# Patient Record
Sex: Female | Born: 1935
Health system: Southern US, Community
[De-identification: ages and names within clinical notes are randomized; demographics above are authoritative.]

## PROBLEM LIST (undated history)

## (undated) DIAGNOSIS — Z87440 Personal history of urinary (tract) infections: Secondary | ICD-10-CM

## (undated) DIAGNOSIS — K219 Gastro-esophageal reflux disease without esophagitis: Secondary | ICD-10-CM

## (undated) DIAGNOSIS — Z5320 Procedure and treatment not carried out because of patient's decision for unspecified reasons: Secondary | ICD-10-CM

## (undated) DIAGNOSIS — I872 Venous insufficiency (chronic) (peripheral): Secondary | ICD-10-CM

## (undated) DIAGNOSIS — E785 Hyperlipidemia, unspecified: Secondary | ICD-10-CM

## (undated) DIAGNOSIS — D649 Anemia, unspecified: Secondary | ICD-10-CM

## (undated) DIAGNOSIS — I4891 Unspecified atrial fibrillation: Secondary | ICD-10-CM

## (undated) DIAGNOSIS — M419 Scoliosis, unspecified: Secondary | ICD-10-CM

## (undated) DIAGNOSIS — F419 Anxiety disorder, unspecified: Secondary | ICD-10-CM

## (undated) DIAGNOSIS — R413 Other amnesia: Secondary | ICD-10-CM

## (undated) DIAGNOSIS — M1990 Unspecified osteoarthritis, unspecified site: Secondary | ICD-10-CM

## (undated) DIAGNOSIS — M797 Fibromyalgia: Secondary | ICD-10-CM

## (undated) DIAGNOSIS — C189 Malignant neoplasm of colon, unspecified: Secondary | ICD-10-CM

## (undated) DIAGNOSIS — Z8612 Personal history of poliomyelitis: Secondary | ICD-10-CM

## (undated) DIAGNOSIS — Z888 Allergy status to other drugs, medicaments and biological substances status: Secondary | ICD-10-CM

## (undated) HISTORY — DX: Venous insufficiency (chronic) (peripheral): I87.2

## (undated) HISTORY — DX: Allergy status to other drugs, medicaments and biological substances: Z88.8

## (undated) HISTORY — DX: Hyperlipidemia, unspecified: E78.5

## (undated) HISTORY — DX: Fibromyalgia: M79.7

## (undated) HISTORY — DX: Unspecified osteoarthritis, unspecified site: M19.90

## (undated) HISTORY — PX: OTHER SURGICAL HISTORY: SHX169

## (undated) HISTORY — PX: TOTAL ABDOMINAL HYSTERECTOMY W/ BILATERAL SALPINGOOPHORECTOMY: SHX83

## (undated) HISTORY — DX: Anxiety disorder, unspecified: F41.9

## (undated) HISTORY — PX: APPENDECTOMY: SHX54

## (undated) HISTORY — DX: Malignant neoplasm of colon, unspecified: C18.9

## (undated) HISTORY — DX: Procedure and treatment not carried out because of patient's decision for unspecified reasons: Z53.20

## (undated) HISTORY — DX: Anemia, unspecified: D64.9

## (undated) HISTORY — DX: Scoliosis, unspecified: M41.9

## (undated) HISTORY — DX: Unspecified atrial fibrillation: I48.91

## (undated) HISTORY — DX: Gastro-esophageal reflux disease without esophagitis: K21.9

## (undated) HISTORY — DX: Personal history of poliomyelitis: Z86.12

## (undated) HISTORY — DX: Personal history of urinary (tract) infections: Z87.440

---
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
## 1999-02-04 ENCOUNTER — Ambulatory Visit (HOSPITAL_COMMUNITY): Admission: RE | Admit: 1999-02-04 | Discharge: 1999-02-04 | Payer: Self-pay | Admitting: Obstetrics and Gynecology

## 1999-02-04 ENCOUNTER — Encounter: Payer: Self-pay | Admitting: Obstetrics and Gynecology

## 1999-03-18 ENCOUNTER — Other Ambulatory Visit: Admission: RE | Admit: 1999-03-18 | Discharge: 1999-03-18 | Payer: Self-pay | Admitting: Obstetrics and Gynecology

## 2000-07-27 ENCOUNTER — Other Ambulatory Visit: Admission: RE | Admit: 2000-07-27 | Discharge: 2000-07-27 | Payer: Self-pay | Admitting: Obstetrics and Gynecology

## 2000-07-27 ENCOUNTER — Encounter: Payer: Self-pay | Admitting: Obstetrics and Gynecology

## 2000-07-27 ENCOUNTER — Ambulatory Visit (HOSPITAL_COMMUNITY): Admission: RE | Admit: 2000-07-27 | Discharge: 2000-07-27 | Payer: Self-pay | Admitting: Obstetrics and Gynecology

## 2001-08-23 ENCOUNTER — Ambulatory Visit (HOSPITAL_COMMUNITY): Admission: RE | Admit: 2001-08-23 | Discharge: 2001-08-23 | Payer: Self-pay | Admitting: Obstetrics and Gynecology

## 2001-08-23 ENCOUNTER — Encounter: Payer: Self-pay | Admitting: Obstetrics and Gynecology

## 2003-09-18 ENCOUNTER — Ambulatory Visit (HOSPITAL_COMMUNITY): Admission: RE | Admit: 2003-09-18 | Discharge: 2003-09-18 | Payer: Self-pay | Admitting: Obstetrics and Gynecology

## 2004-09-30 ENCOUNTER — Ambulatory Visit (HOSPITAL_COMMUNITY): Admission: RE | Admit: 2004-09-30 | Discharge: 2004-09-30 | Payer: Self-pay | Admitting: Obstetrics and Gynecology

## 2004-12-02 ENCOUNTER — Ambulatory Visit: Payer: Self-pay | Admitting: Pulmonary Disease

## 2005-12-15 ENCOUNTER — Ambulatory Visit: Payer: Self-pay | Admitting: Pulmonary Disease

## 2006-01-26 ENCOUNTER — Ambulatory Visit (HOSPITAL_COMMUNITY): Admission: RE | Admit: 2006-01-26 | Discharge: 2006-01-26 | Payer: Self-pay | Admitting: Obstetrics and Gynecology

## 2006-12-29 ENCOUNTER — Ambulatory Visit: Payer: Self-pay | Admitting: Pulmonary Disease

## 2006-12-29 LAB — CONVERTED CEMR LAB
ALT: 16 units/L (ref 0–35)
Alkaline Phosphatase: 98 units/L (ref 39–117)
BUN: 11 mg/dL (ref 6–23)
Bilirubin, Direct: 0.1 mg/dL (ref 0.0–0.3)
Calcium: 9.2 mg/dL (ref 8.4–10.5)
Direct LDL: 154.3 mg/dL
Eosinophils Absolute: 0.3 10*3/uL (ref 0.0–0.6)
GFR calc Af Amer: 106 mL/min
GFR calc non Af Amer: 88 mL/min
HCT: 38.2 % (ref 36.0–46.0)
Hemoglobin: 13 g/dL (ref 12.0–15.0)
MCV: 87.9 fL (ref 78.0–100.0)
Neutro Abs: 3.7 10*3/uL (ref 1.4–7.7)
Platelets: 245 10*3/uL (ref 150–400)
TSH: 1.61 microintl units/mL (ref 0.35–5.50)
Triglycerides: 110 mg/dL (ref 0–149)
WBC: 6 10*3/uL (ref 4.5–10.5)

## 2008-10-30 ENCOUNTER — Ambulatory Visit: Payer: Self-pay | Admitting: Pulmonary Disease

## 2008-10-30 ENCOUNTER — Encounter: Payer: Self-pay | Admitting: Adult Health

## 2008-10-30 DIAGNOSIS — M797 Fibromyalgia: Secondary | ICD-10-CM | POA: Insufficient documentation

## 2008-10-30 DIAGNOSIS — F411 Generalized anxiety disorder: Secondary | ICD-10-CM | POA: Insufficient documentation

## 2008-10-30 DIAGNOSIS — M4120 Other idiopathic scoliosis, site unspecified: Secondary | ICD-10-CM | POA: Insufficient documentation

## 2008-10-30 DIAGNOSIS — Z8612 Personal history of poliomyelitis: Secondary | ICD-10-CM

## 2008-10-30 DIAGNOSIS — E785 Hyperlipidemia, unspecified: Secondary | ICD-10-CM

## 2008-10-30 LAB — CONVERTED CEMR LAB: Vit D, 25-Hydroxy: 34 ng/mL (ref 30–89)

## 2008-11-01 LAB — CONVERTED CEMR LAB
ALT: 16 units/L (ref 0–35)
AST: 27 units/L (ref 0–37)
Albumin: 3.8 g/dL (ref 3.5–5.2)
Basophils Relative: 0.1 % (ref 0.0–3.0)
Bilirubin Urine: NEGATIVE
Bilirubin, Direct: 0.1 mg/dL (ref 0.0–0.3)
CO2: 32 meq/L (ref 19–32)
Calcium: 9.2 mg/dL (ref 8.4–10.5)
Chloride: 110 meq/L (ref 96–112)
Cholesterol: 194 mg/dL (ref 0–200)
Creatinine, Ser: 0.7 mg/dL (ref 0.4–1.2)
Eosinophils Relative: 2.7 % (ref 0.0–5.0)
GFR calc non Af Amer: 87.01 mL/min (ref >60)
HDL: 57.4 mg/dL (ref >39.00)
Ketones, ur: NEGATIVE mg/dL
Leukocytes, UA: NEGATIVE
Lymphocytes Relative: 15.1 % (ref 12.0–46.0)
MCV: 88.9 fL (ref 78.0–100.0)
Monocytes Relative: 7.5 % (ref 3.0–12.0)
Neutrophils Relative %: 74.6 % (ref 43.0–77.0)
RBC: 4.15 M/uL (ref 3.87–5.11)
Sodium: 146 meq/L — ABNORMAL HIGH (ref 135–145)
Specific Gravity, Urine: 1.015 (ref 1.000–1.030)
TSH: 1.46 microintl units/mL (ref 0.35–5.50)
Total CHOL/HDL Ratio: 3
Total Protein, Urine: NEGATIVE mg/dL
Total Protein: 7.1 g/dL (ref 6.0–8.3)
Triglycerides: 93 mg/dL (ref 0.0–149.0)
WBC: 8.4 10*3/uL (ref 4.5–10.5)
pH: 7.5 (ref 5.0–8.0)

## 2009-05-02 ENCOUNTER — Ambulatory Visit (HOSPITAL_COMMUNITY): Admission: RE | Admit: 2009-05-02 | Discharge: 2009-05-02 | Payer: Self-pay | Admitting: Pulmonary Disease

## 2009-06-28 HISTORY — PX: LOW ANTERIOR BOWEL RESECTION: SUR1240

## 2009-07-14 ENCOUNTER — Ambulatory Visit: Payer: Self-pay | Admitting: Internal Medicine

## 2009-07-14 ENCOUNTER — Ambulatory Visit: Payer: Self-pay | Admitting: Gastroenterology

## 2009-07-14 ENCOUNTER — Inpatient Hospital Stay (HOSPITAL_COMMUNITY): Admission: EM | Admit: 2009-07-14 | Discharge: 2009-07-24 | Payer: Self-pay | Admitting: Emergency Medicine

## 2009-07-14 ENCOUNTER — Telehealth: Payer: Self-pay | Admitting: Pulmonary Disease

## 2009-07-15 ENCOUNTER — Encounter: Payer: Self-pay | Admitting: Gastroenterology

## 2009-07-15 ENCOUNTER — Encounter (INDEPENDENT_AMBULATORY_CARE_PROVIDER_SITE_OTHER): Payer: Self-pay | Admitting: Internal Medicine

## 2009-07-17 ENCOUNTER — Encounter (INDEPENDENT_AMBULATORY_CARE_PROVIDER_SITE_OTHER): Payer: Self-pay

## 2009-07-19 ENCOUNTER — Encounter: Payer: Self-pay | Admitting: Internal Medicine

## 2009-07-23 ENCOUNTER — Ambulatory Visit: Payer: Self-pay | Admitting: Oncology

## 2009-07-30 ENCOUNTER — Encounter: Payer: Self-pay | Admitting: Pulmonary Disease

## 2009-08-12 ENCOUNTER — Encounter: Payer: Self-pay | Admitting: Gastroenterology

## 2009-08-20 ENCOUNTER — Telehealth: Payer: Self-pay | Admitting: Internal Medicine

## 2009-08-21 DIAGNOSIS — I4891 Unspecified atrial fibrillation: Secondary | ICD-10-CM

## 2009-08-21 DIAGNOSIS — I480 Paroxysmal atrial fibrillation: Secondary | ICD-10-CM | POA: Insufficient documentation

## 2009-08-27 ENCOUNTER — Ambulatory Visit: Payer: Self-pay | Admitting: Internal Medicine

## 2009-09-03 ENCOUNTER — Ambulatory Visit: Payer: Self-pay | Admitting: Pulmonary Disease

## 2009-09-07 LAB — CONVERTED CEMR LAB
Albumin: 3.5 g/dL (ref 3.5–5.2)
BUN: 13 mg/dL (ref 6–23)
Basophils Relative: 0.1 % (ref 0.0–3.0)
CEA: 3.5 ng/mL (ref 0.0–5.0)
Calcium: 9.2 mg/dL (ref 8.4–10.5)
Eosinophils Relative: 3 % (ref 0.0–5.0)
GFR calc non Af Amer: 74.41 mL/min (ref >60)
Glucose, Bld: 91 mg/dL (ref 70–99)
HCT: 36.8 % (ref 36.0–46.0)
Iron: 39 ug/dL — ABNORMAL LOW (ref 42–145)
Lymphs Abs: 0.7 10*3/uL (ref 0.7–4.0)
Monocytes Relative: 5.3 % (ref 3.0–12.0)
Platelets: 264 10*3/uL (ref 150.0–400.0)
RBC: 4.14 M/uL (ref 3.87–5.11)
Total Bilirubin: 0.4 mg/dL (ref 0.3–1.2)
Transferrin: 256.5 mg/dL (ref 212.0–360.0)
WBC: 8.2 10*3/uL (ref 4.5–10.5)

## 2009-09-08 ENCOUNTER — Encounter: Payer: Self-pay | Admitting: Pulmonary Disease

## 2009-09-08 ENCOUNTER — Encounter: Payer: Self-pay | Admitting: Internal Medicine

## 2009-09-08 DIAGNOSIS — I872 Venous insufficiency (chronic) (peripheral): Secondary | ICD-10-CM

## 2009-09-08 DIAGNOSIS — N390 Urinary tract infection, site not specified: Secondary | ICD-10-CM | POA: Insufficient documentation

## 2009-09-08 DIAGNOSIS — D649 Anemia, unspecified: Secondary | ICD-10-CM

## 2009-09-08 DIAGNOSIS — K219 Gastro-esophageal reflux disease without esophagitis: Secondary | ICD-10-CM | POA: Insufficient documentation

## 2009-09-08 DIAGNOSIS — Z888 Allergy status to other drugs, medicaments and biological substances status: Secondary | ICD-10-CM

## 2009-09-08 DIAGNOSIS — M1990 Unspecified osteoarthritis, unspecified site: Secondary | ICD-10-CM | POA: Insufficient documentation

## 2009-09-08 DIAGNOSIS — C189 Malignant neoplasm of colon, unspecified: Secondary | ICD-10-CM

## 2010-03-18 ENCOUNTER — Ambulatory Visit: Payer: Self-pay | Admitting: Pulmonary Disease

## 2010-03-21 LAB — CONVERTED CEMR LAB
Albumin: 4.2 g/dL (ref 3.5–5.2)
Alkaline Phosphatase: 91 units/L (ref 39–117)
BUN: 13 mg/dL (ref 6–23)
CEA: 1.3 ng/mL (ref 0.0–5.0)
Calcium: 9.4 mg/dL (ref 8.4–10.5)
Eosinophils Absolute: 0.2 10*3/uL (ref 0.0–0.7)
Eosinophils Relative: 3.6 % (ref 0.0–5.0)
GFR calc non Af Amer: 94.42 mL/min (ref >60)
Glucose, Bld: 74 mg/dL (ref 70–99)
HDL: 59.5 mg/dL (ref >39.00)
Lymphocytes Relative: 23.4 % (ref 12.0–46.0)
MCHC: 34 g/dL (ref 30.0–36.0)
Monocytes Relative: 8.4 % (ref 3.0–12.0)
Neutro Abs: 3.8 10*3/uL (ref 1.4–7.7)
Neutrophils Relative %: 64.1 % (ref 43.0–77.0)
RBC: 4.19 M/uL (ref 3.87–5.11)
Sodium: 143 meq/L (ref 135–145)
TSH: 1.17 microintl units/mL (ref 0.35–5.50)
Total Bilirubin: 0.5 mg/dL (ref 0.3–1.2)
Total CHOL/HDL Ratio: 4
Transferrin: 248.5 mg/dL (ref 212.0–360.0)
VLDL: 36.8 mg/dL (ref 0.0–40.0)
WBC: 5.9 10*3/uL (ref 4.5–10.5)

## 2010-03-30 ENCOUNTER — Telehealth (INDEPENDENT_AMBULATORY_CARE_PROVIDER_SITE_OTHER): Payer: Self-pay | Admitting: *Deleted

## 2010-05-25 ENCOUNTER — Encounter (INDEPENDENT_AMBULATORY_CARE_PROVIDER_SITE_OTHER): Payer: Self-pay | Admitting: *Deleted

## 2010-06-26 ENCOUNTER — Encounter (INDEPENDENT_AMBULATORY_CARE_PROVIDER_SITE_OTHER): Payer: Self-pay | Admitting: *Deleted

## 2010-07-01 ENCOUNTER — Ambulatory Visit
Admission: RE | Admit: 2010-07-01 | Discharge: 2010-07-01 | Payer: Self-pay | Source: Home / Self Care | Attending: Gastroenterology | Admitting: Gastroenterology

## 2010-07-16 ENCOUNTER — Ambulatory Visit
Admission: RE | Admit: 2010-07-16 | Discharge: 2010-07-16 | Payer: Self-pay | Source: Home / Self Care | Attending: Gastroenterology | Admitting: Gastroenterology

## 2010-07-16 ENCOUNTER — Encounter: Payer: Self-pay | Admitting: Gastroenterology

## 2010-07-30 NOTE — Miscellaneous (Signed)
Summary: [facility] Previsit/prep  Clinical Lists Changes  Medications: Added new medication of MOVIPREP 100 GM  SOLR (PEG-KCL-NACL-NASULF-NA ASC-C) As per prep instructions. - Signed Rx of MOVIPREP 100 GM  SOLR (PEG-KCL-NACL-NASULF-NA ASC-C) As per prep instructions.;  #1 x 0;  Signed;  Entered by: Wyona Almas RN;  Authorized by: Louis Meckel MD;  Method used: Electronically to [id number] [address], [place], [place]  [address], Ph: [phone], Fax: [phone] Observations: Added new observation of ALLERGY REV: Done (07/01/2010 10:18)    Prescriptions: MOVIPREP 100 GM  SOLR (PEG-KCL-NACL-NASULF-NA ASC-C) As per prep instructions.  #1 x 0   Entered by:   Wyona Almas RN   Authorized by:   Louis Meckel MD   Signed by:   Wyona Almas RN on 07/01/2010   Method used:   Electronically to        [facility] [id number] (retail)       [address] [place], [place]  [address]       Ph: [phone]       Fax: [phone]   RxID:   [id number]

## 2010-07-30 NOTE — Progress Notes (Signed)
Summary: refill meds  Phone Note Refill Request Call back at Home Phone [phone] Message from:  Patient on August 20, 2009 10:58 AM  amiodarone 200 mg [facility] [address]   Method Requested: Fax to Local Pharmacy Initial call taken by: Lorne Skeens,  August 20, 2009 10:59 AM    New/Updated Medications: AMIODARONE HCL 200 MG TABS (AMIODARONE HCL) Take one tablet by mouth twice a day Prescriptions: AMIODARONE HCL 200 MG TABS (AMIODARONE HCL) Take one tablet by mouth twice a day  #60 x 1   Entered by:   Hardin Negus, RMA   Authorized by:   Dolores Patty, MD, [facility]   Signed by:   Hardin Negus, RMA on 08/20/2009   Method used:   Electronically to        [id number]* (retail)       [address] [place], [place]  [address]       Ph: [phone]       Fax: [phone]   RxID:   [id number]

## 2010-07-30 NOTE — Procedures (Addendum)
Summary: Colonoscopy  Patient: [name] Note: All result statuses are Final unless otherwise noted.  Tests: (1) Colonoscopy (COL)   COL Colonoscopy           DONE     [facility]     [address]     [place], [place]  [address]           COLONOSCOPY PROCEDURE REPORT           PATIENT:  Brenda Hammond, Brenda Hammond  MR#:  [id number]     BIRTHDATE:  [birth year], 77 yrs. old  GENDER:  female           ENDOSCOPIST:  Barbette Hair. Arlyce Dice, MD     Referred by:           PROCEDURE DATE:  07/16/2010     PROCEDURE:  Diagnostic Colonoscopy     ASA CLASS:  Class II     INDICATIONS:  1) screening  2) history of colon cancer Resection     2011           MEDICATIONS:   Fentanyl 62.5 mcg IV, Versed 5 mg IV           DESCRIPTION OF PROCEDURE:   After the risks benefits and     alternatives of the procedure were thoroughly explained, informed     consent was obtained.  Digital rectal exam was performed and     revealed no abnormalities.   The LB [id number] endoscope     was introduced through the anus and advanced to the cecum, which     was identified by both the appendix and ileocecal valve, without     limitations.  The quality of the prep was excellent, using     MoviPrep.  The instrument was then slowly withdrawn as the colon     was fully examined.     <<PROCEDUREIMAGES>>           FINDINGS:  Arteriovenous malformations were seen in the in the     cecum (see image2). 4-69mm early AVM  There was a surgical     anastomosis in the sigmoid colon (see image7). Anastamosis at 15cm     This was otherwise a normal examination of the colon (see image1,     image3, image5, image8, and image9).   Retroflexed views in the     rectum revealed no abnormalities.    The time to cecum =  3.25     minutes. The scope was then withdrawn (time =  5.25  min) from the     patient and the procedure completed.           COMPLICATIONS:  None           ENDOSCOPIC IMPRESSION:     1) AVM's in the  cecum     2) Anastomosis in the sigmoid colon     3) Otherwise normal examination     RECOMMENDATIONS:     1) Repeat Colonoscopy in 3 years.           REPEAT EXAM:   3 year(s) Colonoscopy           ______________________________     Barbette Hair. Arlyce Dice, MD           CC: Michele Mcalpine, MD, Rolm Baptise, MD           n.     eSIGNED:   Barbette Hair. Lucianna Ostlund at 07/16/2010 08:58 AM  [place], [place], [phone]  Note: An exclamation mark (!) indicates a result that was not dispersed into the flowsheet. Document Creation Date: 07/16/2010 8:59 AM _______________________________________________________________________  (1) Order result status: Final Collection or observation date-time: 07/16/2010 08:50 Requested date-time:  Receipt date-time:  Reported date-time:  Referring Physician:   Ordering Physician: Melvia Heaps [phone]) Specimen Source:  Source: Launa Grill Order Number: [id number] Lab site:   Appended Document: Colonoscopy    Clinical Lists Changes  Observations: Added new observation of COLONNXTDUE: 06/2013 (07/16/2010 13:41)

## 2010-07-30 NOTE — Letter (Signed)
Summary: [facility]   Imported By: Sherian Rein 09/24/2009 13:36:04  _____________________________________________________________________  External Attachment:    Type:   Image     Comment:   External Document

## 2010-07-30 NOTE — Assessment & Plan Note (Signed)
Summary: f/u 6 months - 1 yr///kp   Primary Care Provider:  scott nadel  CC:  32 mnonth ROV & review of recent medical problems....  History of Present Illness: 77 y/o WF here for a follow up visit... I last saw her 7/08 for an office visit due to rash (she is very allergic to sulfa & was taking Lasix20 for edema)... she refused CPX, routine CXR, EKG, labs; and refused screening colonoscopy, Flu shots, Pneumovax, etc...   Presented to ER 07/13/09 w/ rectal bleeding & colonoscopy by Dorris Singh revealed a sigmoid mass... CT scan was otherw neg (3cm tumor, neg nodes, neg liver) & she had a low anterior resection w/ anastomosis by DrHIngram... path showed adenocarcinoma ?T2-3 N0- neg margins, 11/11 neg nodes, but tumor invasion thru muscularis into pericolonic adipose tissue...  post-op she had AFib & Cards was consulted- she was treated w/ Amio, which has since been stopped in follow up... also had IV infiltate cellulitis- also resolved after Doxy Rx... she had post hosp f/u w/ DrIngram 07/30/09- doing well...  she saw DrSherrill for Oncology 08/12/09- she was given an option for adjuvant chemotherapy vs a research protocol & she chose observation alone...    Current Problems:   ATRIAL FIBRILLATION (ICD-427.31) - this occured post-op colon resection... seen by DrBensimhon for Cards and discharged on Amiodarone 400mg /d... she converted to NSR & the Amio was stopped 3/11... she denies any irregularity/ CP/ palpit/ etc...  ~  Baseline EKG showed NSR, ?LAE, IVCD, borderline tracing...  ~  2DEcho 1/11 showed norm LVF w/ EF= 55-65%, sl incr wall thickness= mild LVH, mild MR, mild LA dil at 29mm...  VENOUS INSUFFICIENCY (ICD-459.81)  HYPERLIPIDEMIA (ICD-272.4) - on diet alone, she has refused statin Rx.  ~  FLP 7/08 showed TChol 220, TG 110, HDL 49, LDL 154  Hx of GERD (ICD-530.81) - ?she reports HH & "ulcer" found on UGI series yrs ago... she knows that she can take PPI (Ompeprazole 20mg ) vs H2Blocker Rx  (Zantac, Pepcid)...  Hx of ADENOCARCINOMA, COLON (ICD-153.9) **see above**  Hx of UTI (ICD-599.0)  DEGENERATIVE JOINT DISEASE (ICD-715.90) FIBROMYALGIA (ICD-729.1) - currently using TYLENOL Prn... symptoms were suggestive of FM & she was referred to Rheum in 1996 [facility])... he felt she had scoliosis + chr LBP w/ poss superimposed FM... he rec Skelaxin & Ambien 5mg  (even 1/2 Skelaxin too sedating, & did try the [place])... he rec counselling & phys therapy... she subseq tried Serzone & improved... she had a second opinion consult from DrTruslow in 1997... rec to try Integrative Therapies...  SCOLIOSIS (ICD-737.30) - she was evaluated by a chiropractor in the 90s w/ DDD... subseq MRI was reported to show no herniations or neuro compromise...  POLIOMYELITIS, HX OF (ICD-V12.02) - she had poliomyelitis in the 1950's... it affected her right leg, and she has a long hx of scoliosis... she saw DrLove in 1996 for vertigo & LBP.  ANXIETY (ICD-300.00) - intol to anxiolytic Rx in the past... she is very religious and "I cope well"...  ANEMIA (ICD-285.9) - Hg was 12.9 on admission 1/11... nadir value in hosp was 9.7.Marland Kitchen.  ~  labs 3/11 showed Hg= 11.9, MCV= 89, Fe= 39 (sat11%), B12= 478... rec> OTC Fe + VitC daily...  Hx of OTHER DRUG ALLERGY (ICD-995.27) - she has mult medication allergies and sensitivities w/ >10 drugs on the list.  ~  intol to HRT after her hyst & BSO...  ~  intol to support hose for her VI, edema.  ~  Tranxene: single dose caused oversedation  ~  Pamelor: caused nightmares  Hx of SURG/OTH PROC NOT CARRIED OUT BECAUSE PTS DECN (ICD-V64.2) - she has repeatedly refused attempts to sched CPX, routine CXR, EKG, lab work, screening colon, f/u GYN checks Hulda Humphrey prev), mammograms, & all vaccinations...   Allergies: 1)  ! Pamelor 2)  ! Pcn 3)  ! Sulfa 4)  ! * Geocillin 5)  ! Ampicillin 6)  ! Macrodantin 7)  ! Flexeril 8)  ! Asa 9)  ! * Skelaxin 10)  ! * Antinflammatories 11)   ! Nsaids  Past History:  Past Medical History:  ATRIAL FIBRILLATION (ICD-427.31) VENOUS INSUFFICIENCY (ICD-459.81) HYPERLIPIDEMIA (ICD-272.4) Hx of GERD (ICD-530.81) Hx of ADENOCARCINOMA, COLON (ICD-153.9) Hx of UTI (ICD-599.0) DEGENERATIVE JOINT DISEASE (ICD-715.90) FIBROMYALGIA (ICD-729.1) SCOLIOSIS (ICD-737.30) POLIOMYELITIS, HX OF (ICD-V12.02) ANXIETY (ICD-300.00) ANEMIA (ICD-285.9) Hx of OTHER DRUG ALLERGY (ICD-995.27) Hx of SURG/OTH PROC NOT CARRIED OUT BECAUSE PTS DECN (ICD-V64.2)  1. Atrial Fib, post-op in setting of colon CA resection 2/10     --echo normal 2. ARTHRITIS -uses as needed  prev ortho eval Dr. Jonathon Resides w/ rotator cuff tendonitis.  3. FIBROMYALGIA  uses as needed tylenol. prev eval w/ Truslow 1997 4. hx of Polio.  5. ANXIETY- no meds.  6. s/p hysterectomy.   7. HYPERLIPIDEMIA   8. UTI  9. SCOLIOSIS   10. POLIOMYELITIS, HX OF  11. HIATAL HERNIA, HX OF    Past Surgical History: S/P T & A S/P appendectomy S/P hysterectomy & oopherectomy in the 1980's S/P low anterior resection w/ anastomosis for colon cancer 1/11 by DrHIngram  Family History: Reviewed history from 10/30/2008 and no changes required. father died at 23 of a stroke mother died at 75 and had a glioblastoma  Social History: Reviewed history from 08/21/2009 and no changes required. originally from [place] and has lived with her husband in [place] since 1968. never smoked no alcohol 1 grown daughter work Systems developer in Brewing technologist  Married   Review of Systems  The patient denies anorexia, fever, weight loss, weight gain, vision loss, decreased hearing, hoarseness, chest pain, syncope, dyspnea on exertion, peripheral edema, prolonged cough, headaches, hemoptysis, abdominal pain, melena, hematochezia, severe indigestion/heartburn, hematuria, incontinence, muscle weakness, suspicious skin lesions, transient blindness, difficulty walking, depression, unusual weight change, abnormal bleeding,  enlarged lymph nodes, and angioedema.    Vital Signs:  Patient profile:   77 year old female Height:      61 inches Weight:      111 pounds BMI:     21.05 O2 Sat:      99 % on Room air Temp:     97.2 degrees F oral Pulse rate:   62 / minute BP sitting:   136 / 72  (left arm) Cuff size:   regular  Vitals Entered By: Randell Loop CMA (September 03, 2009 9:04 AM)  O2 Sat at Rest %:  99 O2 Flow:  Room air CC: 32 mnonth ROV & review of recent medical problems... Is Patient Diabetic? No Pain Assessment Patient in pain? yes      Onset of pain  right knee pain and numbness into her thigh Comments meds updated today   Physical Exam  Additional Exam:  WD, Thin, 77 y/o WF in NAD... GENERAL:  Alert & oriented; pleasant & cooperative. HEENT:  [place]/AT, EOM-wnl, PERRLA, EACs-clear, TMs-wnl, NOSE-clear, THROAT-clear & wnl. NECK:  Supple w/ fairROM; no JVD; normal carotid impulses w/o bruits; no thyromegaly or nodules palpated; no lymphadenopathy. CHEST:  Clear to P & A; without wheezes/ rales/ or rhonchi. HEART:  Regular Rhythm; without murmurs/ rubs/ or gallops. ABDOMEN:  Soft & nontender, s/p surg; normal bowel sounds; no organomegaly or masses detected. EXT: without deformities or arthritic changes; no varicose veins/ venous insuffic/ or edema. NEURO:  CN's intact; motor testing normal; sensory testing normal; gait normal & balance OK. DERM:  No lesions noted; no rash etc...    MISC. Report  Procedure date:  09/03/2009  Findings:      DATA REVIEWED:  ~  Old chart reviewed for EMR entry...  ~  Hosp data from 1/11 including Summaries, Labs, Otis, etc...   Impression & Recommendations:  Problem # 1:  ADENOCARCINOMA, COLON (ICD-153.9) Surg by Lurline Hare,  Oncology from DrSherrill,  GI from DrKaplan Orders: TLB-BMP (Basic Metabolic Panel-BMET) (80048-METABOL) TLB-Hepatic/Liver Function Pnl (80076-HEPATIC) TLB-CBC Platelet - w/Differential (85025-CBCD) TLB-IBC Pnl  (Iron/FE;Transferrin) (83550-IBC) TLB-B12 + Folate Pnl (01027_25366-Y40/HKV) TLB-CEA (Carcinoembryonic Antigen) (82378-CEA)  Problem # 2:  ATRIAL FIBRILLATION (ICD-427.31) Holding NSR off Amio now per DrBensimhon... The following medications were removed from the medication list:    Amiodarone Hcl 200 Mg Tabs (Amiodarone hcl) .Marland Kitchen... Take one tablet by mouth twice a day  Problem # 3:  HYPERLIPIDEMIA (ICD-272.4) Shje continues to refuse Statin Rx...  Problem # 4:  ANEMIA (ICD-285.9) Hg improved to 11.9 ...  Problem # 5:  OTHER MEDICAL PROBLEMS AS NOTED>>>  Complete Medication List: 1)  Tylenol 325 Mg Tabs (Acetaminophen) .... Take 1-2 tabs by mouth every 6-8 h as needed for pain.Marland KitchenMarland Kitchen 2)  Calcium Carbonate-vitamin D 600-400 Mg-unit Tabs (Calcium carbonate-vitamin d) .... Take 1 tablet by mouth two times a day 3)  One-a-day Womens Tabs (Multiple vitamins-calcium) .... Take 1 tablet by mouth once a day 4)  Vitamin D 400 Unit Tabs (Cholecalciferol) .... Once daily  Patient Instructions: 1)  Today we updated your med list- see below.... 2)  Keep your follow up appt w/ DrIngram, and be sure to inquire when you will need a follow up colonoscopy... 3)  Today we did your follow up blood work... please call the "phone tree" in a few days for your lab results.Marland KitchenMarland Kitchen 4)  We will send a copy of your labs to DrIngram & to DrSherrill... 5)  Call for any problems.Marland KitchenMarland Kitchen

## 2010-07-30 NOTE — Assessment & Plan Note (Signed)
Summary: 6 months/apc   Primary Care Deidre Carino:  scott nadel  CC:  6 month ROV & review of mult medical problems....  History of Present Illness: 77 y/o WF here for a follow up visit... I last saw her 7/08 for an office visit due to rash (she is very allergic to sulfa & was taking Lasix20 for edema)... she refused CPX, routine CXR, EKG, labs; and refused screening colonoscopy, Flu shots, Pneumovax, etc...   Presented to ER 07/13/09 w/ rectal bleeding & colonoscopy by Dorris Singh revealed a sigmoid mass... CT scan was otherw neg (3cm tumor, neg nodes, neg liver) & she had a low anterior resection w/ anastomosis by DrHIngram... path showed adenocarcinoma ?T2-3 N0- neg margins, 11/11 neg nodes, but tumor invasion thru muscularis into pericolonic adipose tissue...  post-op she had AFib & Cards was consulted- she was treated w/ Amio, which has since been stopped in follow up... also had IV infiltate cellulitis- also resolved after Doxy Rx... she had post hosp f/u w/ DrIngram 07/30/09- doing well...  she saw DrSherrill for Oncology 08/12/09- she was given an option for adjuvant chemotherapy vs a research protocol & she chose observation alone...    ~  March 18, 2010:  58mo ROV & due for labs w/ CEA level she wants sent to [facility] & DrSherrill... states she is feeling well- walking daily, good appetite, eating well/ no problems... denies Abd pain, nausea, vomiting, diarrhea (x w/ chinese food), constip, blood, etc... she is not on any perscription meds just taking Calcium, MVI, Vit D, SlowFe...   Current Problems:   ATRIAL FIBRILLATION (ICD-427.31) - this occured post-op colon resection... seen by DrBensimhon for Cards and discharged on Amiodarone 400mg /d... she converted to NSR & the Amio was stopped 3/11... she denies any irregularity/ CP/ palpit/ etc...  ~  Baseline EKG showed NSR, ?LAE, IVCD, borderline tracing...  ~  2DEcho 1/11 showed norm LVF w/ EF= 55-65%, sl incr wall thickness= mild LVH, mild MR,  mild LA dil at 29mm...  VENOUS INSUFFICIENCY (ICD-459.81) - she knows to restrict sodium, elevate legs, wear support hose, etc...  HYPERLIPIDEMIA (ICD-272.4) - on diet alone, she has refused statin Rx, and doesn't want f/u FLP.  ~  FLP 7/08 showed TChol 220, TG 110, HDL 49, LDL 154  ~  FLP 9/11 showed TChol 228, TG 184, HDL 60, LDL 135... she desires to continue diet alone.  Hx of GERD (ICD-530.81) - ?she reports HH & "ulcer" found on UGI series yrs ago... she knows that she can take PPI (Ompeprazole 20mg ) vs H2Blocker Rx (Zantac, Pepcid)...  Hx of ADENOCARCINOMA, COLON (ICD-153.9) **see above** she is due for f/u colonoscopy 1/12 by DrKaplan...  ~  NOTE:  pre op CEA level 1/11 = 5.2  ~  f/u CEA level 3/11 = 3.5  ~  labs 9/11 showed CEA level = 1.3  Hx of UTI (ICD-599.0)  DEGENERATIVE JOINT DISEASE (ICD-715.90) FIBROMYALGIA (ICD-729.1) - currently using TYLENOL Prn... symptoms were suggestive of FM & she was referred to Rheum in 1996 [facility])... he felt she had scoliosis + chr LBP w/ poss superimposed FM... he rec Skelaxin & Ambien 5mg  (even 1/2 Skelaxin too sedating, & did try the [place])... he rec counselling & phys therapy... she subseq tried Serzone & improved... she had a second opinion consult from DrTruslow in 1997... rec to try Integrative Therapies...  SCOLIOSIS (ICD-737.30) - she was evaluated by a chiropractor in the 90s w/ DDD... subseq MRI was reported to show no herniations or neuro  compromise...  POLIOMYELITIS, HX OF (ICD-V12.02) - she had poliomyelitis in the 1950's... it affected her right leg, and she has a long hx of scoliosis... she saw DrLove in 1996 for vertigo & LBP.  ANXIETY (ICD-300.00) - intol to anxiolytic Rx in the past... she is very religious and "I cope well"...  ANEMIA (ICD-285.9) - Hg was 12.9 on admission 1/11... nadir value in hosp was 9.7.Marland Kitchen.  ~  labs 3/11 showed Hg= 11.9, MCV= 89, Fe= 39 (sat11%), B12= 478... rec> OTC Fe + VitC daily...  ~  labs 9/11  showed Hg=   Hx of OTHER DRUG ALLERGY (ICD-995.27) - she has mult medication allergies and sensitivities w/ >10 drugs on the list.  ~  intol to HRT after her hyst & BSO...  ~  intol to support hose for her VI, edema.  ~  Tranxene: single dose caused oversedation.  ~  Pamelor: caused nightmares.  Hx of SURG/OTH PROC NOT CARRIED OUT BECAUSE PTS DECN (ICD-V64.2) - she had repeatedly refused attempts to sched CPX, routine CXR, EKG, lab work, screening colon, f/u GYN checks Hulda Humphrey prev), mammograms, & all vaccinations...   Preventive Screening-Counseling & Management  Alcohol-Tobacco     Smoking Status: never  Allergies: 1)  ! Pamelor 2)  ! Pcn 3)  ! Sulfa 4)  ! * Geocillin 5)  ! Ampicillin 6)  ! Macrodantin 7)  ! Flexeril 8)  ! Asa 9)  ! * Skelaxin 10)  ! * Antinflammatories 11)  ! Nsaids  Comments:  Nurse/Medical Assistant: The patient's medications and allergies were reviewed with the patient and were updated in the Medication and Allergy Lists.  Past History:  Past Medical History: ATRIAL FIBRILLATION (ICD-427.31) VENOUS INSUFFICIENCY (ICD-459.81) HYPERLIPIDEMIA (ICD-272.4) Hx of GERD (ICD-530.81) Hx of ADENOCARCINOMA, COLON (ICD-153.9) Hx of UTI (ICD-599.0) DEGENERATIVE JOINT DISEASE (ICD-715.90) FIBROMYALGIA (ICD-729.1) SCOLIOSIS (ICD-737.30) POLIOMYELITIS, HX OF (ICD-V12.02) ANXIETY (ICD-300.00) ANEMIA (ICD-285.9) Hx of OTHER DRUG ALLERGY (ICD-995.27) Hx of SURG/OTH PROC NOT CARRIED OUT BECAUSE PTS DECN (ICD-V64.2)  Past Surgical History: S/P T & A S/P appendectomy S/P hysterectomy & oopherectomy in the 1980's S/P low anterior resection w/ anastomosis for colon cancer 1/11 by DrHIngram  Family History: Reviewed history from 10/30/2008 and no changes required. father died at 52 of a stroke mother died at 75 and had a glioblastoma  Social History: Reviewed history from 08/21/2009 and no changes required. originally from [place] and has lived with her  husband in [place] since 1968. never smoked no alcohol 1 grown daughter work Systems developer in Brewing technologist  Married   Review of Systems      See HPI  The patient denies anorexia, fever, weight loss, weight gain, vision loss, decreased hearing, hoarseness, chest pain, syncope, dyspnea on exertion, peripheral edema, prolonged cough, headaches, hemoptysis, abdominal pain, melena, hematochezia, severe indigestion/heartburn, hematuria, incontinence, muscle weakness, suspicious skin lesions, transient blindness, difficulty walking, depression, unusual weight change, abnormal bleeding, enlarged lymph nodes, and angioedema.    Vital Signs:  Patient profile:   77 year old female Height:      61 inches Weight:      113 pounds BMI:     21.43 O2 Sat:      99 % on Room air Temp:     97.2 degrees F oral Pulse rate:   69 / minute BP sitting:   128 / 80  (left arm) Cuff size:   regular  Vitals Entered By: Randell Loop CMA (March 18, 2010 10:36 AM)  O2 Sat at Rest %:  99 O2 Flow:  Room air CC: 6 month ROV & review of mult medical problems... Is Patient Diabetic? No Pain Assessment Patient in pain? no      Comments meds updated today with pt   Physical Exam  Additional Exam:  WD, Thin, 77 y/o WF in NAD... GENERAL:  Alert & oriented; pleasant & cooperative. HEENT:  [place]/AT, EOM-wnl, PERRLA, EACs-clear, TMs-wnl, NOSE-clear, THROAT-clear & wnl. NECK:  Supple w/ fairROM; no JVD; normal carotid impulses w/o bruits; no thyromegaly or nodules palpated; no lymphadenopathy. CHEST:  Clear to P & A; without wheezes/ rales/ or rhonchi. HEART:  Regular Rhythm; without murmurs/ rubs/ or gallops. ABDOMEN:  Soft & nontender, s/p surg; normal bowel sounds; no organomegaly or masses detected. EXT: without deformities or arthritic changes; no varicose veins/ venous insuffic/ or edema. NEURO:  CN's intact; motor testing normal; sensory testing normal; gait normal & balance OK. DERM:  No lesions noted; no  rash etc...    MISC. Report  Procedure date:  03/18/2010  Findings:      BMP (METABOL)   Sodium                    143 mEq/L                   135-145   Potassium                 4.4 mEq/L                   3.5-5.1   Chloride                  104 mEq/L                   96-112   Carbon Dioxide            29 mEq/L                    19-32   Glucose                   74 mg/dL                    16-10   BUN                       13 mg/dL                    9-60   Creatinine                0.7 mg/dL                   4.5-4.0   Calcium                   9.4 mg/dL                   9.8-11.9   GFR                       94.42 mL/min                >60   Hepatic/Liver Function Panel (HEPATIC)   Total Bilirubin           0.5 mg/dL                   1.4-7.8  Direct Bilirubin          0.1 mg/dL                   1.9-1.4   Alkaline Phosphatase      91 U/L                      39-117   AST                       28 U/L                      0-37   ALT                       18 U/L                      0-35   Total Protein             7.0 g/dL                    7.8-2.9   Albumin                   4.2 g/dL                    5.6-2.1  CBC Platelet w/Diff (CBCD)   White Cell Count          5.9 K/uL                    4.5-10.5   Red Cell Count            4.19 Mil/uL                 3.87-5.11   Hemoglobin                12.9 g/dL                   30.8-65.7   Hematocrit                38.0 %                      36.0-46.0   MCV                       90.8 fl                     78.0-100.0   Platelet Count            179.0 K/uL                  150.0-400.0   Neutrophil %              64.1 %                      43.0-77.0   Lymphocyte %              23.4 %                      12.0-46.0   Monocyte %                8.4 %  3.0-12.0   Eosinophils%              3.6 %                       0.0-5.0   Basophils %               0.5 %                       0.0-3.0  Comments:      IBC  Panel (IBC)   Iron                 [H]  147 ug/dL                   78-295   Transferrin               248.5 mg/dL                 621.3-086.5   Iron Saturation           42.3 %                      20.0-50.0  Lipid Panel (LIPID)   Cholesterol          [H]  228 mg/dL                   7-846   Triglycerides        [H]  184.0 mg/dL                 9.6-295.2   HDL                       84.13 mg/dL                 >24.40 Cholesterol LDL - Direct                             134.8 mg/dL  TSH (TSH)   FastTSH                   1.17 uIU/mL                 0.35-5.50  CEA (CEA)   CEA                       1.3 ng/mL                   0.0-5.0  Vitamin D (25-Hydroxy) (10272)  Vitamin D (25-Hydroxy)                             55 ng/mL                    30-89   Impression & Recommendations:  Problem # 1:  Hx of ADENOCARCINOMA, COLON (ICD-153.9) F/u labs look good and she is asymptomatic... copies too DrIngram & Sherrill per request... Orders: TLB-BMP (Basic Metabolic Panel-BMET) (80048-METABOL) TLB-Hepatic/Liver Function Pnl (80076-HEPATIC) TLB-CBC Platelet - w/Differential (85025-CBCD) TLB-IBC Pnl (Iron/FE;Transferrin) (83550-IBC) TLB-Lipid Panel (80061-LIPID) TLB-TSH (Thyroid Stimulating Hormone) (84443-TSH) TLB-CEA (Carcinoembryonic Antigen) (82378-CEA) >> 1.3 down from 5.2 & 3.5 T-Vitamin D (25-Hydroxy) (53664-40347)  Problem # 2:  ATRIAL FIBRILLATION (ICD-427.31) She is holding NSR... she knows to avoid caffeine,  pseudophed, etc...  Problem # 3:  VENOUS INSUFFICIENCY (ICD-459.81) She nows to avoid sodium/ salt, elevate legs, etc... states she is intol to support hose.  Problem # 4:  HYPERLIPIDEMIA (ICD-272.4) She follows a low chol/ low fat diet & refuses statin Rx... not interested in FLP f/u etc...  Problem # 5:  Hx of GERD (ICD-530.81) She denies GERD, abd pain, etc...  Problem # 6:  DEGENERATIVE JOINT DISEASE (ICD-715.90) She has hx of DJD, FM, Scoliosis, & LBP... uses  Tylenol Prn & states she is doing well- no other complaints. Her updated medication list for this problem includes:    Tylenol 325 Mg Tabs (Acetaminophen) .Marland Kitchen... Take 1-2 tabs by mouth every 6-8 h as needed for pain...  Problem # 7:  ANEMIA (ICD-285.9) Blood count & iron level improved... she is wanting to stop the Fe supplement... Her updated medication list for this problem includes:    Slow Fe 160 (50 Fe) Mg Cr-tabs (Ferrous sulfate dried) .Marland Kitchen... Take 1 tablet by mouth once a day  Problem # 8:  OTHER MEDICAL PROBLEMS AS NOTED>>>   Complete Medication List: 1)  Tylenol 325 Mg Tabs (Acetaminophen) .... Take 1-2 tabs by mouth every 6-8 h as needed for pain.Marland KitchenMarland Kitchen 2)  Calcium Carbonate-vitamin D 600-400 Mg-unit Tabs (Calcium carbonate-vitamin d) .... Take 1 tablet by mouth two times a day 3)  One-a-day Womens Tabs (Multiple vitamins-calcium) .... Take 1 tablet by mouth once a day 4)  Vitamin D 400 Unit Tabs (Cholecalciferol) .... Once daily 5)  Slow Fe 160 (50 Fe) Mg Cr-tabs (Ferrous sulfate dried) .... Take 1 tablet by mouth once a day  Patient Instructions: 1)  Today we updated your med list- see below.... 2)  Today we did your FASTING blood work & included a CEA level... please call the "phone tree" in a few days for your lab results.Marland KitchenMarland Kitchen 3)  We will send copies to [facility] & DrSherrill... 4)  Call for any problems.Marland KitchenMarland Kitchen 5)  Please schedule a follow-up appointment in 6 months.

## 2010-07-30 NOTE — Progress Notes (Signed)
Summary: Supporting lab dx codes  ---- Converted from flag ---- ---- 03/27/2010 5:44 PM, Michele Mcalpine MD wrote: use 733.90 and 268.9.Marland KitchenMarland Kitchen SN  ---- 03/27/2010 2:44 PM, Leodis Liverpool wrote: Please send supporting dx code for Vit D on 03/18/10.  [facility] denied payment by insurance with 780.2 and 780.4. Thanks Darl Pikes ------------------------------

## 2010-07-30 NOTE — Letter (Signed)
[facility]   Imported By: Sherian Rein 08/29/2009 11:29:22  _____________________________________________________________________  External Attachment:    Type:   Image     Comment:   External Document

## 2010-07-30 NOTE — Assessment & Plan Note (Signed)
Summary: eph/per d/c summary/jml   Visit Type:  Initial Consult Primary Provider:  scott nadel  CC:  post hospital.  History of Present Illness: Brenda Hammond is a very pleasant 77 y/o woman who I saw recently in the hospital for post-op atrial fib with ventricular rates over 200 bpm in the setting of colon CA resection. She converted with amiodarone and is here for f/u. Echo showed normal LV function.  She returns today for post-hospital f/u. Doing well. back to work 2 hours per day. No palpitations or other cardiac symptoms. Bowel probelms straightened out. Will not need chemo.  Current Medications (verified): 1)  Furosemide 20 Mg Tabs (Furosemide) .... 1/2-1 Whole Tab By Mouth Once Daily As Needed Swelling 2)  One-A-Day Womens  Tabs (Multiple Vitamins-Calcium) .... Take 1 Tablet By Mouth Once A Day 3)  Calcium Carbonate-Vitamin D 600-400 Mg-Unit  Tabs (Calcium Carbonate-Vitamin D) .... Take 1 Tablet By Mouth Two Times A Day 4)  Amiodarone Hcl 200 Mg Tabs (Amiodarone Hcl) .... Take One Tablet By Mouth Twice A Day 5)  Vitamin D 400 Unit  Tabs (Cholecalciferol) .... Once Daily  Allergies (verified): 1)  ! Pamelor 2)  ! Pcn 3)  ! Sulfa 4)  ! * Geocillin 5)  ! Ampicillin 6)  ! Macrodantin 7)  ! Flexeril 8)  ! Asa 9)  ! * Skelaxin 10)  ! * Antinflammatories 11)  ! Nsaids  Past History:  Past Medical History: 1. Atrial Fib, post-op in setting of colon CA resection 2/10     --echo normal 2. ARTHRITIS -uses as needed  prev ortho eval Dr. Jonathon Resides w/ rotator cuff tendonitis.  3. FIBROMYALGIA  uses as needed tylenol. prev eval w/ Truslow 1997 4. hx of Polio.  5. ANXIETY- no meds.  6. s/p hysterectomy.   7. HYPERLIPIDEMIA   8. UTI  9. SCOLIOSIS   10. POLIOMYELITIS, HX OF  11. HIATAL HERNIA, HX OF   12. refuses colonoscoy referral (Oct 30, 2008), refuses Td / Pnuemovax (Oct 30, 2008 )  Past Surgical History: colon resection Abdominal Hysterectomy-Total  Review of Systems   As per HPI and past medical history; otherwise all systems negative.   Vital Signs:  Patient profile:   77 year old female Height:      61 inches Weight:      107 pounds BMI:     20.29 Pulse rate:   60 / minute BP sitting:   142 / 68  (left arm) Cuff size:   regular  Vitals Entered By: Hardin Negus, RMA (August 27, 2009 10:49 AM)  Physical Exam  General:  Gen: well appearing. no resp difficulty HEENT: normal Neck: supple. no JVD. Carotids 2+ bilat; no bruits. No lymphadenopathy or thryomegaly appreciated. Cor: PMI nondisplaced. Regular rate & rhythm. No rubs, gallops, murmur. Lungs: clear Abdomen: soft, nontender, nondistended. No hepatosplenomegaly. No bruits or masses. Good bowel sounds. Extremities: no cyanosis, clubbing, rash, edema Neuro: alert & orientedx3, cranial nerves grossly intact. moves all 4 extremities w/o difficulty. affect pleasant    Impression & Recommendations:  Problem # 1:  ATRIAL FIBRILLATION (ICD-427.31) Resolved. Can d/c amio and follow-up on a as needed basis.   Other Orders: EKG w/ Interpretation (93000)  Patient Instructions: 1)  Your physician recommends that you schedule a follow-up appointment in: as needed

## 2010-07-30 NOTE — Letter (Signed)
Summary: Pre Visit Letter Revised  [facility]  [address] [place], [place] [address]   Phone: [phone]  Fax: [phone]        05/25/2010 MRN: [id number] [facility] [address] [place], [place]  [address]             Procedure Date:  07/16/2010  Welcome to the Gastroenterology Division at [facility].    You are scheduled to see a nurse for your pre-procedure visit on 07/01/2010 at 10:30AM on the 3rd floor at [facility], [address].  We ask that you try to arrive at our office 15 minutes prior to your appointment time to allow for check-in.  Please take a minute to review the attached form.  If you answer "Yes" to one or more of the questions on the first page, we ask that you call the person listed at your earliest opportunity.  If you answer "No" to all of the questions, please complete the rest of the form and bring it to your appointment.    Your nurse visit will consist of discussing your medical and surgical history, your immediate family medical history, and your medications.   If you are unable to list all of your medications on the form, please bring the medication bottles to your appointment and we will list them.  We will need to be aware of both prescribed and over the counter drugs.  We will need to know exact dosage information as well.    Please be prepared to read and sign documents such as consent forms, a financial agreement, and acknowledgement forms.  If necessary, and with your consent, a friend or relative is welcome to sit-in on the nurse visit with you.  Please bring your insurance card so that we may make a copy of it.  If your insurance requires a referral to see a specialist, please bring your referral form from your primary care physician.  No co-pay is required for this nurse visit.     If you cannot keep your appointment, please call [phone] to cancel or reschedule prior to your appointment date.  This  allows [place] the opportunity to schedule an appointment for another patient in need of care.    Thank you for choosing [facility] for your medical needs.  We appreciate the opportunity to care for you.  Please visit [place] at our website  to learn more about our practice.  Sincerely, The Gastroenterology Division

## 2010-07-30 NOTE — Progress Notes (Signed)
Summary: pt in [facility]  Phone Note Call from Patient   Caller: Daughter Call For: Tnia Anglada Summary of Call: wanted to make you aware pt is in [facility] since Sunday evening.   Initial call taken by: Eugene Gavia,  July 14, 2009 4:31 PM  Follow-up for Phone Call        fyi. Carron Curie [facility]  July 14, 2009 4:33 PMN     SN is aware. Randell Loop [facility]  July 14, 2009 4:48 PM

## 2010-07-30 NOTE — Letter (Signed)
Summary: Dr Perrin Maltese Office Note  Dr Angelia Mould Ingram's Office Note   Imported By: Roderic Ovens 10/01/2009 14:28:47  _____________________________________________________________________  External Attachment:    Type:   Image     Comment:   External Document

## 2010-07-30 NOTE — Letter (Signed)
Summary: [facility] Instructions  [facility]  [address] [place], [place] [address]   Phone: [phone]  Fax: [phone]       Brenda Hammond    03/18/1948    MRN: [id number]        Procedure Day /Date: Thursday  75-19-12      Arrival Time: 7:30 a.m.     Procedure Time: 8:30 a.m.     Location of Procedure:                    _ x_  [facility] (4th Floor)   PREPARATION FOR COLONOSCOPY WITH MOVIPREP   Starting 5 days prior to your procedure  75-14-12  do not eat nuts, seeds, popcorn, corn, beans, peas,  salads, or any raw vegetables.  Do not take any fiber supplements (e.g. Metamucil, Citrucel, and Benefiber).  THE DAY BEFORE YOUR PROCEDURE         DATE:  75-18-12  DAY:  Wednesday   1.  Drink clear liquids the entire day-NO SOLID FOOD  2.  Do not drink anything colored red or purple.  Avoid juices with pulp.  No orange juice.  3.  Drink at least 64 oz. (8 glasses) of fluid/clear liquids during the day to prevent dehydration and help the prep work efficiently.  CLEAR LIQUIDS INCLUDE: Water Jello Ice Popsicles Tea (sugar ok, no milk/cream) Powdered fruit flavored drinks Coffee (sugar ok, no milk/cream) Gatorade Juice: apple, white grape, white cranberry  Lemonade Clear bullion, consomm, broth Carbonated beverages (any kind) Strained chicken noodle soup Hard Candy                             4.  In the morning, mix first dose of MoviPrep solution:    Empty 1 Pouch A and 1 Pouch B into the disposable container    Add lukewarm drinking water to the top line of the container. Mix to dissolve    Refrigerate (mixed solution should be used within 24 hrs)  5.  Begin drinking the prep at 5:00 p.m. The MoviPrep container is divided by 4 marks.   Every 15 minutes drink the solution down to the next mark (approximately 8 oz) until the full liter is complete.   6.  Follow completed prep with 16 oz of clear liquid of your choice (Nothing red or  purple).  Continue to drink clear liquids until bedtime.  7.  Before going to bed, mix second dose of MoviPrep solution:    Empty 1 Pouch A and 1 Pouch B into the disposable container    Add lukewarm drinking water to the top line of the container. Mix to dissolve    Refrigerate  THE DAY OF YOUR PROCEDURE      DATE:  75-19-12  DAY:  Thursday  Beginning at  3:30 a.m. (5 hours before procedure):         1. Every 15 minutes, drink the solution down to the next mark (approx 8 oz) until the full liter is complete.  2. Follow completed prep with 16 oz. of clear liquid of your choice.    3. You may drink clear liquids until  75:30 a.m. (2 HOURS BEFORE PROCEDURE).   MEDICATION INSTRUCTIONS  Unless otherwise instructed, you should take regular prescription medications with a small sip of water   as early as possible the morning of your procedure.  OTHER INSTRUCTIONS  You will need a responsible adult at least 75 years of age to accompany you and drive you home.   This person must remain in the waiting room during your procedure.  Wear loose fitting clothing that is easily removed.  Leave jewelry and other valuables at home.  However, you may wish to bring a book to read or  an iPod/MP3 player to listen to music as you wait for your procedure to start.  Remove all body piercing jewelry and leave at home.  Total time from sign-in until discharge is approximately 2-3 hours.  You should go home directly after your procedure and rest.  You can resume normal activities the  day after your procedure.  The day of your procedure you should not:   Drive   Make legal decisions   Operate machinery   Drink alcohol   Return to work  You will receive specific instructions about eating, activities and medications before you leave.    The above instructions have been reviewed and explained to me by   Wyona Almas RN  July 01, 2010 10:45 AM     I fully understand and  can verbalize these instructions _____________________________ Date _________

## 2010-07-30 NOTE — Letter (Signed)
Summary: [facility]   Imported By: Sherian Rein 08/20/2009 13:36:36  _____________________________________________________________________  External Attachment:    Type:   Image     Comment:   External Document

## 2010-07-30 NOTE — Procedures (Signed)
Summary: Colonoscopy  Patient: Brenda Hammond Note: All result statuses are Final unless otherwise noted.  Tests: (1) Colonoscopy (COL)   COL Colonoscopy           DONE     [facility]     [address]     [place], [place]  [address]           COLONOSCOPY PROCEDURE REPORT           PATIENT:  Brenda Hammond, Brenda Hammond  MR#:  [id number]     BIRTHDATE:  [birth year], 76 yrs. old  GENDER:  female           ENDOSCOPIST:  Barbette Hair. Arlyce Dice, MD     Referred by:           PROCEDURE DATE:  07/15/2009     PROCEDURE:  Colonoscopy with biopsy     ASA CLASS:  Class II     INDICATIONS:  hematochezia           MEDICATIONS:   Fentanyl 50 mcg IV, Versed 4 mg IV, glucagon 0.5 mg     IV           DESCRIPTION OF PROCEDURE:   After the risks benefits and     alternatives of the procedure were thoroughly explained, informed     consent was obtained.  Digital rectal exam was performed and     revealed no abnormalities.   The EC-[id number] ([id number]) endoscope was     introduced through the anus and advanced to the cecum, which was     identified by the ileocecal valve, without limitations.  The     quality of the prep was excellent, using MiraLax.  The instrument     was then slowly withdrawn as the colon was fully examined.     <<PROCEDUREIMAGES>>           FINDINGS:  A mass was found. Friable, bleeding 3-4cm sessile mass     beginning at 12-14cm from anus. Bxs taken (see image013 and     image015).  Abnormal appearing mucosa in the cecum. 1cm area of     slightly raised mucosa with radiating ectatic vessels (see     image001 and image002). Bx taken  Moderate diverticulosis was     found in the sigmoid colon (see image012 and image011).  This was     otherwise a normal examination of the colon (see image003,     image004, image005, image006, image008, image009, and image016).     Retroflexed views in the rectum revealed no abnormalities.    The     scope was then withdrawn from the  patient and the procedure     completed.           COMPLICATIONS:  None           ENDOSCOPIC IMPRESSION:     1) Sigmoid Mass     2) Abnormal mucosa in the cecum     3) Moderate diverticulosis in the sigmoid colon     4) Otherwise normal examination     RECOMMENDATIONS:     1) Await biopsy results     2) CT scan of abdomen and pelvis.     3) Consult surgery           REPEAT EXAM:  1 year           ______________________________     Barbette Hair. Arlyce Dice, MD  CC:  Michele Mcalpine, MD           n.     Rosalie DoctorBarbette Hair. Kaplan at 07/15/2009 01:42 PM           Judith Part, [phone]  Note: An exclamation mark (!) indicates a result that was not dispersed into the flowsheet. Document Creation Date: 07/15/2009 1:42 PM _______________________________________________________________________  (1) Order result status: Final Collection or observation date-time: 07/15/2009 13:31 Requested date-time:  Receipt date-time:  Reported date-time:  Referring Physician:   Ordering Physician: Melvia Heaps [phone]) Specimen Source:  Source: Launa Grill Order Number: [id number] Lab site:   Appended Document: Colonoscopy Recall is in IDX for 06/2010.

## 2010-08-27 IMAGING — CR DG CHEST 2V
2 series · 2 of 2 positions shown · non-contrast
Comparison: 07/13/2009

CLINICAL DATA: Preop

CHEST - 2 VIEW

[w chest pa *]
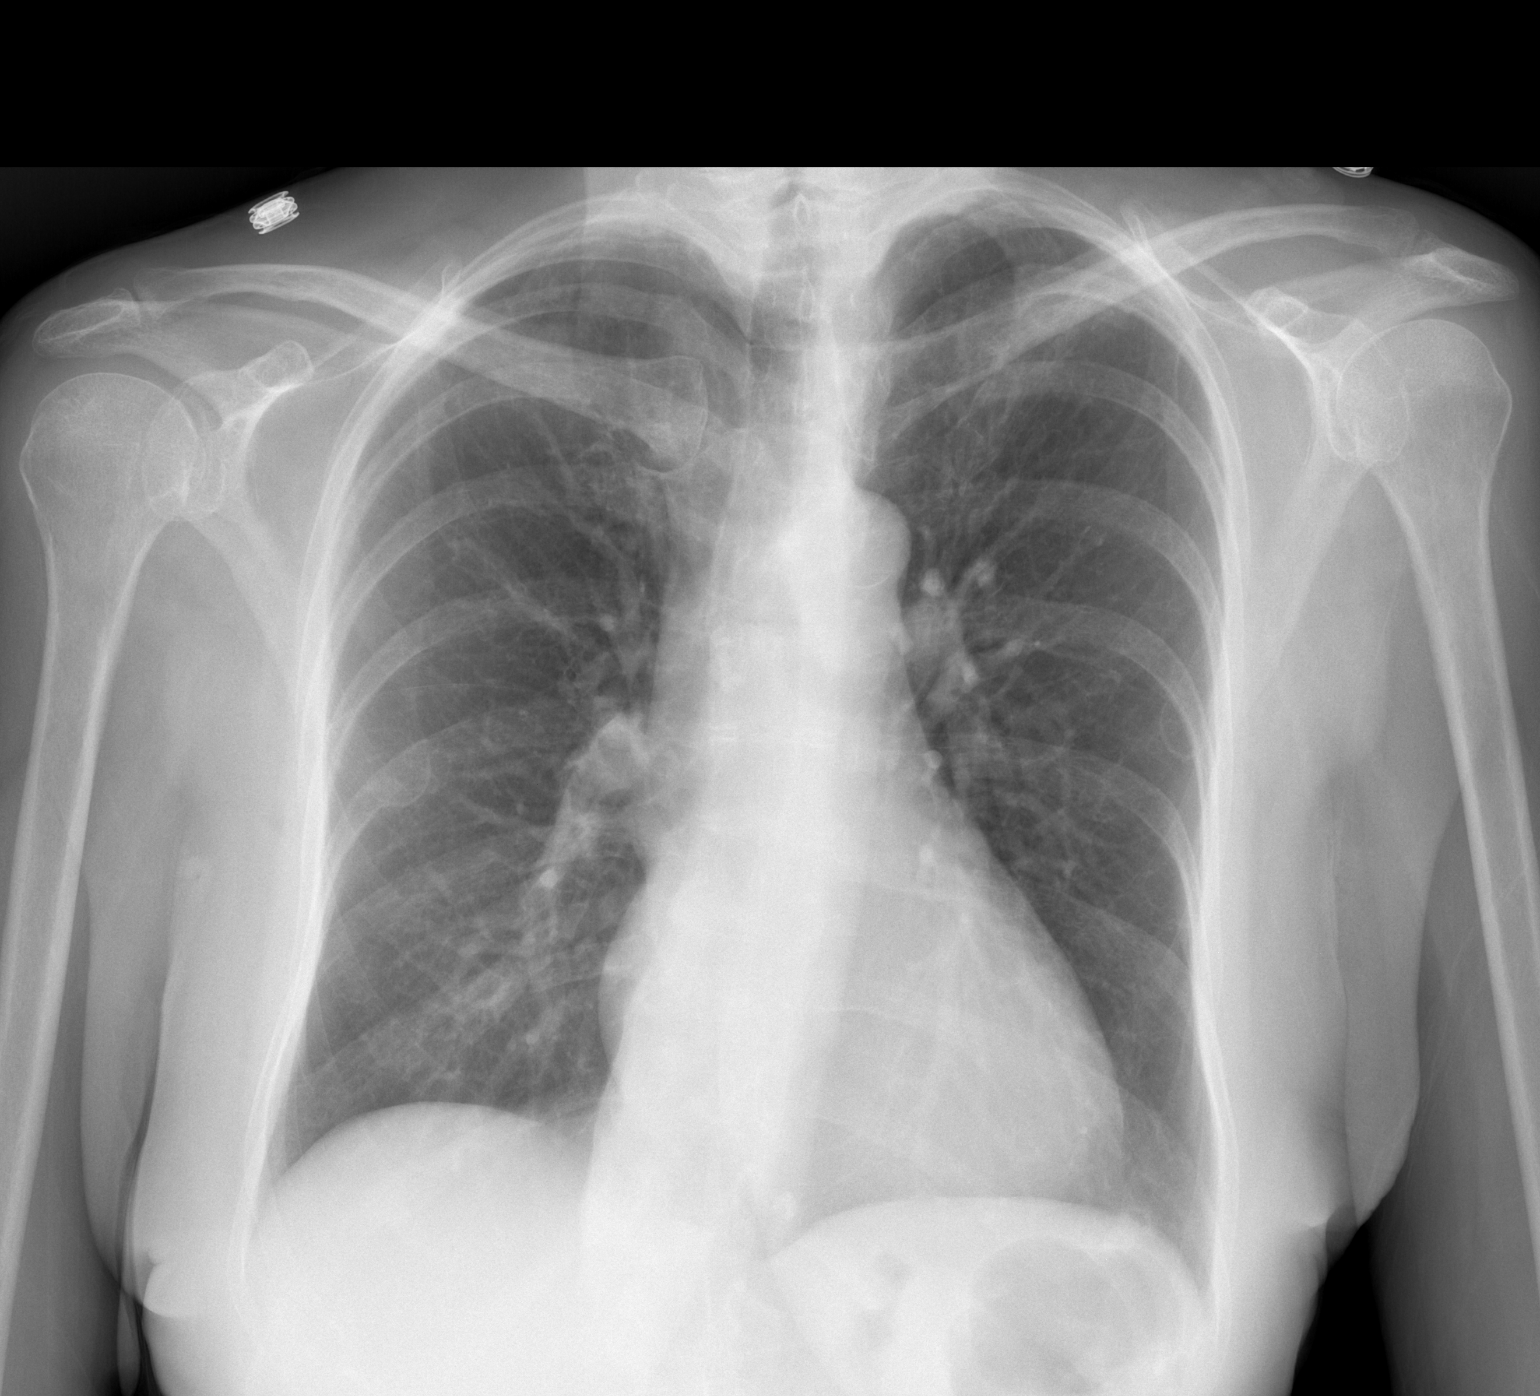

[w chest lat]
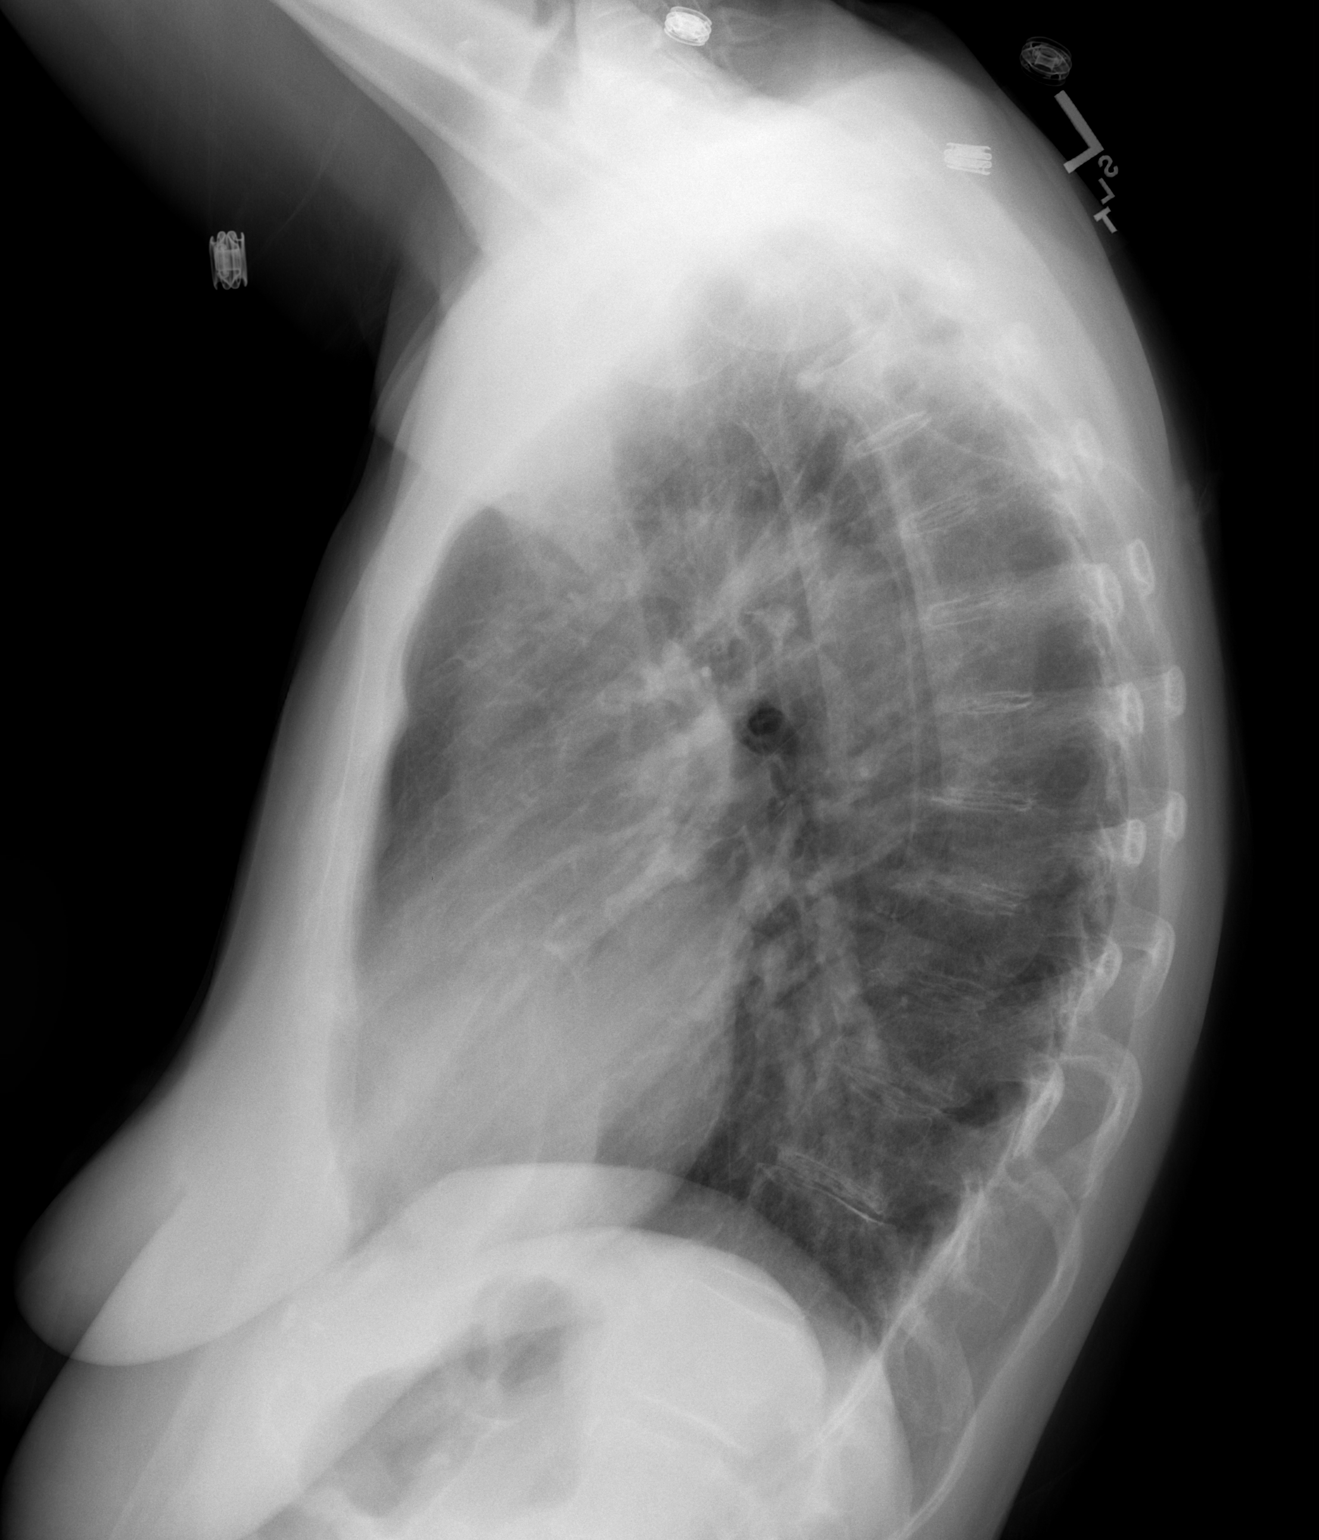

[2 of 2 positions shown; findings below may reference images not displayed]

FINDINGS: The heart is moderately enlarged.  Central bronchitic
changes are stable.  Lungs remain hyper aerated with interstitial
prominence.  No new pulmonary mass or nodule.  Calcified
granulomata about the hilar regions are noted.  No pneumothorax or
pleural effusion.  Thoracic spine is demineralized and intact.
IMPRESSION: Cardiomegaly without pulmonary edema.  Features of COPD.

## 2010-09-14 LAB — POCT I-STAT, CHEM 8
BUN: 15 mg/dL (ref 6–23)
Calcium, Ion: 1.04 mmol/L — ABNORMAL LOW (ref 1.12–1.32)
Creatinine, Ser: 0.9 mg/dL (ref 0.4–1.2)
Glucose, Bld: 107 mg/dL — ABNORMAL HIGH (ref 70–99)
Hemoglobin: 12.9 g/dL (ref 12.0–15.0)
Sodium: 139 mEq/L (ref 135–145)
TCO2: 30 mmol/L (ref 0–100)

## 2010-09-14 LAB — CBC
HCT: 28.6 % — ABNORMAL LOW (ref 36.0–46.0)
HCT: 28.7 % — ABNORMAL LOW (ref 36.0–46.0)
HCT: 29.2 % — ABNORMAL LOW (ref 36.0–46.0)
HCT: 31.3 % — ABNORMAL LOW (ref 36.0–46.0)
HCT: 35.2 % — ABNORMAL LOW (ref 36.0–46.0)
HCT: 36.7 % (ref 36.0–46.0)
Hemoglobin: 12.2 g/dL (ref 12.0–15.0)
Hemoglobin: 9.7 g/dL — ABNORMAL LOW (ref 12.0–15.0)
Hemoglobin: 9.9 g/dL — ABNORMAL LOW (ref 12.0–15.0)
MCHC: 33.4 g/dL (ref 30.0–36.0)
MCHC: 33.8 g/dL (ref 30.0–36.0)
MCHC: 34.8 g/dL (ref 30.0–36.0)
MCV: 89.3 fL (ref 78.0–100.0)
Platelets: 191 10*3/uL (ref 150–400)
Platelets: 204 10*3/uL (ref 150–400)
Platelets: 208 10*3/uL (ref 150–400)
Platelets: 208 10*3/uL (ref 150–400)
Platelets: 221 10*3/uL (ref 150–400)
Platelets: 237 10*3/uL (ref 150–400)
RBC: 3.51 MIL/uL — ABNORMAL LOW (ref 3.87–5.11)
RBC: 3.95 MIL/uL (ref 3.87–5.11)
RBC: 4.27 MIL/uL (ref 3.87–5.11)
RDW: 13.2 % (ref 11.5–15.5)
RDW: 13.3 % (ref 11.5–15.5)
RDW: 13.4 % (ref 11.5–15.5)
RDW: 13.4 % (ref 11.5–15.5)
RDW: 13.4 % (ref 11.5–15.5)
WBC: 15.1 10*3/uL — ABNORMAL HIGH (ref 4.0–10.5)
WBC: 16.6 10*3/uL — ABNORMAL HIGH (ref 4.0–10.5)
WBC: 6.5 10*3/uL (ref 4.0–10.5)
WBC: 8.4 10*3/uL (ref 4.0–10.5)

## 2010-09-14 LAB — COMPREHENSIVE METABOLIC PANEL
ALT: 16 U/L (ref 0–35)
Alkaline Phosphatase: 81 U/L (ref 39–117)
BUN: 2 mg/dL — ABNORMAL LOW (ref 6–23)
Chloride: 106 mEq/L (ref 96–112)
GFR calc Af Amer: >60 mL/min (ref >60)
GFR calc non Af Amer: >60 mL/min (ref >60)
Glucose, Bld: 110 mg/dL — ABNORMAL HIGH (ref 70–99)
Total Bilirubin: 0.7 mg/dL (ref 0.3–1.2)
Total Protein: 6.4 g/dL (ref 6.0–8.3)

## 2010-09-14 LAB — HEMOGLOBIN AND HEMATOCRIT, BLOOD
HCT: 34.5 % — ABNORMAL LOW (ref 36.0–46.0)
HCT: 34.9 % — ABNORMAL LOW (ref 36.0–46.0)
HCT: 36.5 % (ref 36.0–46.0)
Hemoglobin: 11.2 g/dL — ABNORMAL LOW (ref 12.0–15.0)
Hemoglobin: 11.9 g/dL — ABNORMAL LOW (ref 12.0–15.0)
Hemoglobin: 11.9 g/dL — ABNORMAL LOW (ref 12.0–15.0)
Hemoglobin: 12.3 g/dL (ref 12.0–15.0)

## 2010-09-14 LAB — HEPATIC FUNCTION PANEL
ALT: 24 U/L (ref 0–35)
AST: 37 U/L (ref 0–37)
Bilirubin, Direct: 0.2 mg/dL (ref 0.0–0.3)
Total Protein: 5 g/dL — ABNORMAL LOW (ref 6.0–8.3)

## 2010-09-14 LAB — BASIC METABOLIC PANEL
BUN: 1 mg/dL — ABNORMAL LOW (ref 6–23)
BUN: 2 mg/dL — ABNORMAL LOW (ref 6–23)
BUN: 4 mg/dL — ABNORMAL LOW (ref 6–23)
BUN: 5 mg/dL — ABNORMAL LOW (ref 6–23)
CO2: 22 mEq/L (ref 19–32)
CO2: 27 mEq/L (ref 19–32)
CO2: 28 mEq/L (ref 19–32)
Chloride: 108 mEq/L (ref 96–112)
Chloride: 108 mEq/L (ref 96–112)
Chloride: 112 mEq/L (ref 96–112)
Chloride: 99 mEq/L (ref 96–112)
GFR calc Af Amer: >60 mL/min (ref >60)
GFR calc non Af Amer: >60 mL/min (ref >60)
GFR calc non Af Amer: >60 mL/min (ref >60)
GFR calc non Af Amer: >60 mL/min (ref >60)
GFR calc non Af Amer: >60 mL/min (ref >60)
Glucose, Bld: 113 mg/dL — ABNORMAL HIGH (ref 70–99)
Glucose, Bld: 117 mg/dL — ABNORMAL HIGH (ref 70–99)
Glucose, Bld: 131 mg/dL — ABNORMAL HIGH (ref 70–99)
Glucose, Bld: 137 mg/dL — ABNORMAL HIGH (ref 70–99)
Glucose, Bld: 152 mg/dL — ABNORMAL HIGH (ref 70–99)
Potassium: 3.8 mEq/L (ref 3.5–5.1)
Potassium: 3.9 mEq/L (ref 3.5–5.1)
Potassium: 3.9 mEq/L (ref 3.5–5.1)
Potassium: 4 mEq/L (ref 3.5–5.1)
Potassium: 4 mEq/L (ref 3.5–5.1)
Potassium: 4.1 mEq/L (ref 3.5–5.1)
Sodium: 131 mEq/L — ABNORMAL LOW (ref 135–145)
Sodium: 138 mEq/L (ref 135–145)

## 2010-09-14 LAB — TYPE AND SCREEN
ABO/RH(D): O POS
Antibody Screen: NEGATIVE

## 2010-09-14 LAB — CARDIAC PANEL(CRET KIN+CKTOT+MB+TROPI)
CK, MB: 3.4 ng/mL (ref 0.3–4.0)
Relative Index: 0.4 (ref 0.0–2.5)
Relative Index: 0.9 (ref 0.0–2.5)
Relative Index: 1 (ref 0.0–2.5)
Troponin I: 0.09 ng/mL — ABNORMAL HIGH (ref 0.00–0.06)
Troponin I: 0.34 ng/mL — ABNORMAL HIGH (ref 0.00–0.06)

## 2010-09-14 LAB — TSH: TSH: 0.547 u[IU]/mL (ref 0.350–4.500)

## 2010-09-14 LAB — DIFFERENTIAL
Basophils Absolute: 0 10*3/uL (ref 0.0–0.1)
Basophils Relative: 0 % (ref 0–1)
Eosinophils Absolute: 0.2 10*3/uL (ref 0.0–0.7)
Eosinophils Relative: 3 % (ref 0–5)
Monocytes Absolute: 0.6 10*3/uL (ref 0.1–1.0)
Neutro Abs: 4.9 10*3/uL (ref 1.7–7.7)

## 2010-09-14 LAB — HEMOCCULT GUIAC POC 1CARD (OFFICE): Fecal Occult Bld: POSITIVE

## 2010-09-16 ENCOUNTER — Ambulatory Visit: Payer: Self-pay | Admitting: Pulmonary Disease

## 2010-09-30 ENCOUNTER — Ambulatory Visit: Payer: Self-pay | Admitting: Pulmonary Disease

## 2010-10-23 ENCOUNTER — Encounter: Payer: Self-pay | Admitting: Pulmonary Disease

## 2010-10-26 ENCOUNTER — Other Ambulatory Visit: Payer: Self-pay | Admitting: Pulmonary Disease

## 2010-10-26 ENCOUNTER — Other Ambulatory Visit (INDEPENDENT_AMBULATORY_CARE_PROVIDER_SITE_OTHER): Payer: Medicare Other

## 2010-10-26 ENCOUNTER — Ambulatory Visit (INDEPENDENT_AMBULATORY_CARE_PROVIDER_SITE_OTHER): Payer: Medicare Other | Admitting: Pulmonary Disease

## 2010-10-26 ENCOUNTER — Other Ambulatory Visit (INDEPENDENT_AMBULATORY_CARE_PROVIDER_SITE_OTHER): Payer: Medicare Other | Admitting: Pulmonary Disease

## 2010-10-26 ENCOUNTER — Encounter: Payer: Self-pay | Admitting: Pulmonary Disease

## 2010-10-26 DIAGNOSIS — F411 Generalized anxiety disorder: Secondary | ICD-10-CM

## 2010-10-26 DIAGNOSIS — IMO0001 Reserved for inherently not codable concepts without codable children: Secondary | ICD-10-CM

## 2010-10-26 DIAGNOSIS — M1990 Unspecified osteoarthritis, unspecified site: Secondary | ICD-10-CM

## 2010-10-26 DIAGNOSIS — E785 Hyperlipidemia, unspecified: Secondary | ICD-10-CM

## 2010-10-26 DIAGNOSIS — I4891 Unspecified atrial fibrillation: Secondary | ICD-10-CM

## 2010-10-26 DIAGNOSIS — C189 Malignant neoplasm of colon, unspecified: Secondary | ICD-10-CM

## 2010-10-26 DIAGNOSIS — D649 Anemia, unspecified: Secondary | ICD-10-CM

## 2010-10-26 DIAGNOSIS — K219 Gastro-esophageal reflux disease without esophagitis: Secondary | ICD-10-CM

## 2010-10-26 LAB — CBC WITH DIFFERENTIAL/PLATELET
Basophils Absolute: 0 10*3/uL (ref 0.0–0.1)
Basophils Relative: 0.4 % (ref 0.0–3.0)
Eosinophils Absolute: 0.2 10*3/uL (ref 0.0–0.7)
HCT: 37.8 % (ref 36.0–46.0)
Hemoglobin: 12.9 g/dL (ref 12.0–15.0)
Lymphocytes Relative: 26.3 % (ref 12.0–46.0)
Lymphs Abs: 1.6 10*3/uL (ref 0.7–4.0)
MCHC: 34.2 g/dL (ref 30.0–36.0)
Neutro Abs: 3.7 10*3/uL (ref 1.4–7.7)
RBC: 4.22 Mil/uL (ref 3.87–5.11)
RDW: 13.7 % (ref 11.5–14.6)

## 2010-10-26 LAB — HEPATIC FUNCTION PANEL
Alkaline Phosphatase: 84 U/L (ref 39–117)
Bilirubin, Direct: 0 mg/dL (ref 0.0–0.3)
Total Bilirubin: 0.7 mg/dL (ref 0.3–1.2)

## 2010-10-26 LAB — LIPID PANEL: HDL: 64.5 mg/dL (ref >39.00)

## 2010-10-26 LAB — BASIC METABOLIC PANEL
Calcium: 9.2 mg/dL (ref 8.4–10.5)
Creatinine, Ser: 0.7 mg/dL (ref 0.4–1.2)
GFR: 89.49 mL/min (ref >60.00)
Sodium: 139 mEq/L (ref 135–145)

## 2010-10-26 NOTE — Patient Instructions (Signed)
Today we updated your data into our EPIC EMR...  Today we did your follow up fasting blood work...    Please call the PHONE TREE in a few days for your results...    Dial [id number] & when prompted enter your patient number followed by the # symbol...    Your patient number is:  [id number]#  Don't forget to have your follow up Mammogram, baseline bone Density test, & follow up GYN eval from [facility]... Call for any problems or if we can be of service in any way... Let's plan a follow up visit in 1 yr, sooner if needed.Marland KitchenMarland Kitchen

## 2010-10-26 NOTE — Progress Notes (Signed)
Subjective:    Patient ID: Brenda Hammond, female    DOB: [birth year], 77 y.o.   MRN: [id number]  HPI 77 y/o WF here for a follow up visit... I last saw her 7/08 for an office visit due to rash (she is very allergic to sulfa & was taking Lasix20 for edema)... she refused CPX, routine CXR, EKG, labs; and refused screening colonoscopy, Flu shots, Pneumovax, etc...      Presented to ER 07/13/09 w/ rectal bleeding & colonoscopy by Dorris Singh revealed a sigmoid mass... CT scan was otherw neg (3cm tumor, neg nodes, neg liver) & she had a low anterior resection w/ anastomosis by DrHIngram... path showed adenocarcinoma ?T2-3 N0- neg margins, 11/11 neg nodes, but tumor invasion thru muscularis into pericolonic adipose tissue...  post-op she had AFib & Cards was consulted- she was treated w/ Amio, which has since been stopped in follow up... she had post hosp f/u w/ DrIngram 07/30/09- doing well...  she saw DrSherrill for Oncology 08/12/09- she was given an option for adjuvant chemotherapy vs a research protocol & she chose observation alone...   ~  March 18, 2010:  13mo ROV & due for labs w/ CEA level she wants sent to [facility] & DrSherrill... states she is feeling well- walking daily, good appetite, eating well/ no problems... denies Abd pain, nausea, vomiting, diarrhea (x w/ chinese food), constip, blood, etc... she is not on any perscription meds just taking Calcium, MVI, Vit D, SlowFe...  ~  October 26, 2010:  69mo ROV & continues to do well> no new complaints or concerns, just some fibromyalgia related discomfort for which she takes Tylenol... She is not on any prescription meds... She had f/u colonoscopy 1/12 by DrKaplan> AVM in cecum, clean anastomosis in sigmoid region, no lesions seen (f/u planned 6yrs)... Due for f/u fasting blood work today> see below & she requests CEA= 1.2 today...         Problem List:   ATRIAL FIBRILLATION (ICD-427.31) - this occured post-op colon resection... seen by DrBensimhon  for Cards and discharged on Amiodarone 400mg /d... she converted to NSR & the Amio was stopped 3/11... ~  Baseline EKG showed NSR, ?LAE, IVCD, borderline tracing... ~  2DEcho 1/11 showed norm LVF w/ EF= 55-65%, sl incr wall thickness= mild LVH, mild MR, mild LA dil at 29mm... ~  4/12:  Rhythm remains regular>  she denies any irregularity/ CP/ palpit/ etc...  VENOUS INSUFFICIENCY (ICD-459.81) - she knows to restrict sodium, elevate legs, wear support hose, etc...  HYPERLIPIDEMIA (ICD-272.4) - on diet alone, she has refused statin Rx in the past... ~  FLP 7/08 showed TChol 220, TG 110, HDL 49, LDL 154 ~  FLP 9/11 showed TChol 228, TG 184, HDL 60, LDL 135... she desires to continue diet alone. ~  FLP 4/12 showed TChol 207, TG 114, HDL 65, LDL 120... Actually improved on her diet Rx...  Hx of GERD (ICD-530.81) - ?she reports HH & "ulcer" found on UGI series yrs ago... she knows that she can take OTC PPI (Ompeprazole 20mg ) vs H2Blocker Rx (Zantac, Pepcid) whenever she needs it...  Hx of ADENOCARCINOMA, COLON (ICD-153.9) - Presented to ER 07/13/09 w/ rectal bleeding & colonoscopy by Dorris Singh revealed a sigmoid mass... CT scan was otherw neg (3cm tumor, neg nodes, neg liver) & she had a low anterior resection w/ anastomosis by DrHIngram... path showed adenocarcinoma ?T2-3 N0- neg margins, 11/11 neg nodes, but tumor invasion thru muscularis into pericolonic adipose tissue...  she saw DrSherrill for  Oncology 08/12/09- she was given an option for adjuvant chemotherapy vs a research protocol & she chose observation alone...  ~  NOTE:  pre op CEA level 1/11 = 5.2 ~  f/u CEA level 3/11 = 3.5 ~  labs 9/11 showed CEA level = 1.3 ~  Labs 4/12 showed CEA level = 1.2  Hx of UTI (ICD-599.0)  DEGENERATIVE JOINT DISEASE (ICD-715.90) FIBROMYALGIA (ICD-729.1) - currently using TYLENOL Prn... symptoms were suggestive of FM & she was referred to Rheum in 1996 [facility])... he felt she had scoliosis + chr LBP w/ poss  superimposed FM... he rec Skelaxin & Ambien 5mg  (even 1/2 Skelaxin too sedating, & did try the [place])... he rec counselling & phys therapy... she subseq tried Serzone & improved... she had a second opinion consult from DrTruslow in 1997... rec to try Integrative Therapies...  SCOLIOSIS (ICD-737.30) - she was evaluated by a chiropractor in the 90s w/ DDD... subseq MRI was reported to show no herniations or neuro compromise...  POLIOMYELITIS, HX OF (ICD-V12.02) - she had poliomyelitis in the 1950's... it affected her right leg, and she has a long hx of scoliosis... she saw DrLove in 1996 for vertigo & LBP.  ANXIETY (ICD-300.00) - intol to anxiolytic Rx in the past... she is very religious and "I cope well"...  ANEMIA (ICD-285.9) - Hg was 12.9 on admission 1/11... nadir value in hosp was 9.7.Marland Kitchen. ~  labs 3/11 showed Hg= 11.9, MCV= 89, Fe= 39 (sat11%), B12= 478... rec> OTC Fe + VitC daily... ~  labs 9/11 showed Hg= 12.9, MCV= 91, Fe= 147 (42%sat)  Hx of OTHER DRUG ALLERGY (ICD-995.27) - she has mult medication allergies and sensitivities w/ >10 drugs on the list. ~  intol to HRT after her hyst & BSO... ~  intol to support hose for her VI, edema. ~  Tranxene: single dose caused oversedation. ~  Pamelor: caused nightmares.  Hx of SURG/OTH PROC NOT CARRIED OUT BECAUSE PTS DECN (ICD-V64.2) - she had repeatedly refused attempts to sched CPX, routine CXR, EKG, lab work, screening colon, f/u GYN checks Hulda Humphrey prev), mammograms, & all vaccinations... ~  4/12:  She is requested to contact [facility] for GYN f/u, mammograms, & BMD...   Past Surgical History  Procedure Date  . Low anterior bowel resection 06/2009    with anastomosis for colon cancer  . Total abdominal hysterectomy w/ bilateral salpingoophorectomy 1980s  . Appendectomy   . Other surgical history     T & A    Outpatient Encounter Prescriptions as of 10/26/2010  Medication Sig Dispense Refill  . acetaminophen (TYLENOL) 325 MG tablet  Take 1-2 tabs by mouth every 6-8 H as needed for pain       . Calcium Carbonate-Vit D-Min 600-400 MG-UNIT TABS Take 1 tablet by mouth 2 (two) times daily.        . Multiple Vitamins-Calcium (ONE-A-DAY WOMENS PO) Take 1 tablet by mouth daily.        . vitamin E 400 UNIT capsule Take 400 Units by mouth daily.        . ferrous sulfate dried (SLOW FE) 160 (50 FE) MG TBCR Take 160 mg by mouth daily.          Allergies  Allergen Reactions  . Ampicillin     REACTION: rash and high fever  . Aspirin     REACTION: ears ringing  . Cyclobenzaprine Hcl     REACTION: nightmares  . Metaxalone     REACTION: nightmares  . Nitrofurantoin  REACTION: elevated BP and chest pain  . Nortriptyline Hcl   . Nsaids     REACTION: rash  . Penicillins     REACTION: rash, high fever  . Sulfonamide Derivatives     REACTION: elevated HR and BP    Review of Systems        See HPI - all other systems neg except as noted... The patient denies anorexia, fever, weight loss, weight gain, vision loss, decreased hearing, hoarseness, chest pain, syncope, dyspnea on exertion, peripheral edema, prolonged cough, headaches, hemoptysis, abdominal pain, melena, hematochezia, severe indigestion/heartburn, hematuria, incontinence, muscle weakness, suspicious skin lesions, transient blindness, difficulty walking, depression, unusual weight change, abnormal bleeding, enlarged lymph nodes, and angioedema.     Objective:   Physical Exam      WD, Thin, 77 y/o WF in NAD... GENERAL:  Alert & oriented; pleasant & cooperative. HEENT:  [place]/AT, EOM-wnl, PERRLA, EACs-clear, TMs-wnl, NOSE-clear, THROAT-clear & wnl. NECK:  Supple w/ fairROM; no JVD; normal carotid impulses w/o bruits; no thyromegaly or nodules palpated; no lymphadenopathy. CHEST:  Clear to P & A; without wheezes/ rales/ or rhonchi. HEART:  Regular Rhythm; without murmurs/ rubs/ or gallops. ABDOMEN:  Soft & nontender, s/p surg; normal bowel sounds; no organomegaly or  masses detected. EXT: without deformities or arthritic changes; no varicose veins/ venous insuffic/ or edema. NEURO:  CN's intact; motor testing normal; sensory testing normal; gait normal & balance OK. DERM:  No lesions noted; no rash etc...   Assessment & Plan:   Hx colon cancer>  She had f/u colonoscopy 1/12 by DrKaplan, no obvious recurrence;  CEA today = 1.2  Hx PAF>  She remains regular w/o CP, palpit/ SOB/ edema...  Hyperlipid>  FLP much improved on her diet management w/o medication which is the way she likes it...  GERD>  She knows to use Prilosec vs Zantac as needed for reflux symptoms...  DJD/ Scoliosis/ FM>  She uses rest, heat, tylenol, etc...  Other medical issues as noted.Marland KitchenMarland Kitchen

## 2010-10-27 LAB — CEA: CEA: 1.2 ng/mL (ref 0.0–5.0)

## 2011-04-23 ENCOUNTER — Telehealth: Payer: Self-pay | Admitting: Pulmonary Disease

## 2011-04-23 ENCOUNTER — Other Ambulatory Visit: Payer: Self-pay | Admitting: Pulmonary Disease

## 2011-04-23 DIAGNOSIS — M1990 Unspecified osteoarthritis, unspecified site: Secondary | ICD-10-CM

## 2011-04-23 DIAGNOSIS — Z1231 Encounter for screening mammogram for malignant neoplasm of breast: Secondary | ICD-10-CM

## 2011-04-23 NOTE — Telephone Encounter (Signed)
Pt is sch for her annual mammogram on 05/19/11 @ 11:30am at [facility]. She says that SN wanted her to also have a BMD done at that time as well.  Per Leigh, okay to order BMD to be done. Order sent to [facility]. Pt aware PCC's will contact her with this appt.

## 2011-05-19 ENCOUNTER — Ambulatory Visit (HOSPITAL_COMMUNITY)
Admission: RE | Admit: 2011-05-19 | Discharge: 2011-05-19 | Disposition: A | Discharge status: Home / Self Care | Payer: Medicare Other | Source: Ambulatory Visit | Attending: Pulmonary Disease | Admitting: Pulmonary Disease

## 2011-05-19 ENCOUNTER — Encounter: Payer: Self-pay | Admitting: Pulmonary Disease

## 2011-05-19 DIAGNOSIS — Z1231 Encounter for screening mammogram for malignant neoplasm of breast: Secondary | ICD-10-CM | POA: Insufficient documentation

## 2011-05-19 DIAGNOSIS — M1990 Unspecified osteoarthritis, unspecified site: Secondary | ICD-10-CM

## 2011-05-26 ENCOUNTER — Other Ambulatory Visit: Payer: Self-pay | Admitting: *Deleted

## 2011-05-26 MED ORDER — ALENDRONATE SODIUM 70 MG PO TABS: 70.0000 mg | ORAL_TABLET | ORAL | Status: DC

## 2011-09-09 ENCOUNTER — Ambulatory Visit (INDEPENDENT_AMBULATORY_CARE_PROVIDER_SITE_OTHER): Payer: Self-pay | Admitting: General Surgery

## 2011-10-25 ENCOUNTER — Encounter (INDEPENDENT_AMBULATORY_CARE_PROVIDER_SITE_OTHER): Payer: Self-pay | Admitting: General Surgery

## 2011-10-25 ENCOUNTER — Ambulatory Visit (INDEPENDENT_AMBULATORY_CARE_PROVIDER_SITE_OTHER): Payer: Medicare Other | Admitting: General Surgery

## 2011-10-25 VITALS — BP 124/78 | HR 68 | Temp 97.2°F | Ht 60.0 in | Wt 111.0 lb

## 2011-10-25 DIAGNOSIS — C189 Malignant neoplasm of colon, unspecified: Secondary | ICD-10-CM

## 2011-10-25 NOTE — Progress Notes (Signed)
Patient ID: Brenda Hammond, female   DOB: 11/11/1932, 78 y.o.   MRN: [id number]  Chief Complaint  Patient presents with  . Follow-up    yrly resection reck    HPI Brenda Hammond is a 78 y.o. female.  She returns for long-term followup regarding her rectal cancer.  This patient underwent low anterior resection with coloproctostomy with a 29 mm EEA stapler on July 17, 2009. Pathologic stage T2, N0. She was followed for a while Dr. Mancel Bale with observation only and is not being followed at this time. She is being followed closely by Dr. Alroy Dust, her primary care physician.  She is doing well from abdominal and GI standpoint. She did get a viral illness she thinks and got some diarrhea in March and April. She wonders if this might be due to the Fosamax she has started. She is going to discuss that with Dr. Kriste Basque.  Otherwise her weight is stable, denies abdominal pain, blood not seen  in her stools.  Last CBC and liver profile was February 2012. Last colonoscopy was in January 2012. She is scheduled for colonoscopy followup in 2050 with Dr. Arlyce Dice. HPI  Past Medical History  Diagnosis Date  . Atrial fibrillation   . Venous insufficiency   . Hyperlipemia   . GERD (gastroesophageal reflux disease)   . Colon adenocarcinoma   . History of UTI   . DJD (degenerative joint disease)   . Fibromyalgia   . Scoliosis   . History of poliomyelitis   . Anxiety   . Anemia   . Other drug allergy   . Surgical or other procedure not carried out because of patient's decision     Past Surgical History  Procedure Date  . Low anterior bowel resection 06/2009    with anastomosis for colon cancer  . Total abdominal hysterectomy w/ bilateral salpingoophorectomy 1980s  . Appendectomy   . Other surgical history     T & A    Family History  Problem Relation Age of Onset  . Stroke Father   . Cancer Mother     brain    Social History History  Substance Use Topics  . Smoking  status: Never Smoker   . Smokeless tobacco: Never Used  . Alcohol Use: No    Allergies  Allergen Reactions  . Ampicillin     REACTION: rash and high fever  . Aspirin     REACTION: ears ringing  . Cyclobenzaprine Hcl     REACTION: nightmares  . Metaxalone     REACTION: nightmares  . Nitrofurantoin     REACTION: elevated BP and chest pain  . Nortriptyline Hcl   . Nsaids     REACTION: rash  . Penicillins     REACTION: rash, high fever  . Sulfonamide Derivatives     REACTION: elevated HR and BP    Current Outpatient Prescriptions  Medication Sig Dispense Refill  . acetaminophen (TYLENOL) 325 MG tablet Take 1-2 tabs by mouth every 6-8 H as needed for pain       . alendronate (FOSAMAX) 70 MG tablet Take 1 tablet (70 mg total) by mouth every 7 (seven) days. Take with a full glass of water on an empty stomach.  4 tablet  11  . Calcium Carbonate-Vit D-Min 600-400 MG-UNIT TABS Take 1 tablet by mouth 2 (two) times daily.        . ferrous sulfate dried (SLOW FE) 160 (50 FE) MG TBCR Take 160 mg by  mouth daily.        . Multiple Vitamins-Calcium (ONE-A-DAY WOMENS PO) Take 1 tablet by mouth daily.        . vitamin E 400 UNIT capsule Take 400 Units by mouth daily.          Review of Systems Review of Systems  Constitutional: Negative for fever, chills and unexpected weight change.  HENT: Negative for hearing loss, congestion, sore throat, trouble swallowing and voice change.   Eyes: Negative for visual disturbance.  Respiratory: Negative for cough and wheezing.   Cardiovascular: Negative for chest pain, palpitations and leg swelling.  Gastrointestinal: Positive for diarrhea. Negative for nausea, vomiting, abdominal pain, constipation, blood in stool, abdominal distention and anal bleeding.  Genitourinary: Negative for hematuria, vaginal bleeding and difficulty urinating.  Musculoskeletal: Positive for myalgias, back pain and arthralgias.  Skin: Negative for rash and wound.    Neurological: Negative for seizures, syncope and headaches.  Hematological: Negative for adenopathy. Does not bruise/bleed easily.  Psychiatric/Behavioral: Negative for confusion. The patient is nervous/anxious.     Blood pressure 124/78, pulse 68, temperature 97.2 F (36.2 C), temperature source Temporal, height 5' (1.524 m), weight 111 lb (50.349 kg).  Physical Exam Physical Exam  Constitutional: She is oriented to person, place, and time. She appears well-developed and well-nourished. No distress.  HENT:  Head: Normocephalic and atraumatic.  Nose: Nose normal.  Mouth/Throat: No oropharyngeal exudate.  Eyes: Conjunctivae and EOM are normal. Pupils are equal, round, and reactive to light. Left eye exhibits no discharge. No scleral icterus.  Neck: Neck supple. No JVD present. No tracheal deviation present. No thyromegaly present.  Cardiovascular: Normal rate, regular rhythm, normal heart sounds and intact distal pulses.   No murmur heard. Pulmonary/Chest: Effort normal and breath sounds normal. No respiratory distress. She has no wheezes. She has no rales. She exhibits no tenderness.  Abdominal: Soft. Bowel sounds are normal. She exhibits no distension and no mass. There is no tenderness. There is no rebound and no guarding.       No hepatosplenomegaly. No mass. Lower midline Scar well healed. No hernia. No adenopathy.  Musculoskeletal: She exhibits no edema and no tenderness.       Moves slowly due to arthritis and fibromyalgia symptoms.  Lymphadenopathy:    She has no cervical adenopathy.  Neurological: She is alert and oriented to person, place, and time. She exhibits normal muscle tone. Coordination normal.  Skin: Skin is warm. No rash noted. She is not diaphoretic. No erythema. No pallor.  Psychiatric: She has a normal mood and affect. Her behavior is normal. Judgment and thought content normal.    Data Reviewed   Assessment    Adenocarcinoma of the rectum, stage TII N0, no  evidence of recurrent cancer 2 years following anterior resection  Fibromyalgia    Plan    She is reassured that she seems to be doing well from a cancer standpoint.  Next colonoscopies due in 2015, and she plans to do that.  Lab work will be drawn at Dr. Jodelle Green office in June of this year and annually thereafter.  Return to see me if further problems arise.       Angelia Mould. Derrell Lolling, M.D., [facility] General and Minimally invasive Surgery Breast and Colorectal Surgery Office:   [phone] Pager:   [phone]  10/25/2011, 11:57 AM

## 2011-10-25 NOTE — Patient Instructions (Signed)
You physical exam today is normal, the abdominal incision is well healed. There is no evidence of cancer.  I recommend that you get lab work every year and stool Hemoccult tests every year. That can be done with your annual physical with Dr. Kriste Basque.  Be sure to get your colonoscopy with Dr. Arlyce Dice in 2015.  Return to see Dr. Derrell Lolling if any further problems arise.

## 2011-11-03 ENCOUNTER — Telehealth: Payer: Self-pay | Admitting: Pulmonary Disease

## 2011-11-03 NOTE — Telephone Encounter (Signed)
Per SN---main side effects are reflux and heartburn.  thanks

## 2011-11-03 NOTE — Telephone Encounter (Signed)
Spoke with pt and notified of recs per SN. She verbalized understanding and states will keep taking med and will f/u with SN as planned and discuss further. Pt states nothing further needed.

## 2011-11-03 NOTE — Telephone Encounter (Signed)
Spoke with pt. She states that she has noticed diarrhea on and off every since November when she started taking alendronate. She has not had any abd pain, nausea vomiting associated with this. She wants to know if she can stop taking med until next ov with SN in June and they can discuss further at that time.

## 2011-12-22 ENCOUNTER — Other Ambulatory Visit (INDEPENDENT_AMBULATORY_CARE_PROVIDER_SITE_OTHER): Payer: Medicare Other

## 2011-12-22 ENCOUNTER — Ambulatory Visit (INDEPENDENT_AMBULATORY_CARE_PROVIDER_SITE_OTHER): Payer: Medicare Other | Admitting: Pulmonary Disease

## 2011-12-22 ENCOUNTER — Encounter: Payer: Self-pay | Admitting: Pulmonary Disease

## 2011-12-22 VITALS — BP 128/74 | HR 65 | Temp 97.0°F | Ht 61.0 in | Wt 112.8 lb

## 2011-12-22 DIAGNOSIS — E785 Hyperlipidemia, unspecified: Secondary | ICD-10-CM

## 2011-12-22 DIAGNOSIS — E559 Vitamin D deficiency, unspecified: Secondary | ICD-10-CM

## 2011-12-22 DIAGNOSIS — F411 Generalized anxiety disorder: Secondary | ICD-10-CM

## 2011-12-22 DIAGNOSIS — C189 Malignant neoplasm of colon, unspecified: Secondary | ICD-10-CM

## 2011-12-22 DIAGNOSIS — M4120 Other idiopathic scoliosis, site unspecified: Secondary | ICD-10-CM

## 2011-12-22 DIAGNOSIS — K219 Gastro-esophageal reflux disease without esophagitis: Secondary | ICD-10-CM

## 2011-12-22 DIAGNOSIS — D649 Anemia, unspecified: Secondary | ICD-10-CM

## 2011-12-22 DIAGNOSIS — I4891 Unspecified atrial fibrillation: Secondary | ICD-10-CM

## 2011-12-22 DIAGNOSIS — IMO0001 Reserved for inherently not codable concepts without codable children: Secondary | ICD-10-CM

## 2011-12-22 DIAGNOSIS — M1990 Unspecified osteoarthritis, unspecified site: Secondary | ICD-10-CM

## 2011-12-22 LAB — CBC WITH DIFFERENTIAL/PLATELET
Basophils Relative: 0.3 % (ref 0.0–3.0)
Eosinophils Relative: 3.1 % (ref 0.0–5.0)
HCT: 36.8 % (ref 36.0–46.0)
Lymphs Abs: 1.3 10*3/uL (ref 0.7–4.0)
MCHC: 33.2 g/dL (ref 30.0–36.0)
MCV: 90.2 fl (ref 78.0–100.0)
Monocytes Absolute: 0.5 10*3/uL (ref 0.1–1.0)
Platelets: 172 10*3/uL (ref 150.0–400.0)
WBC: 8.2 10*3/uL (ref 4.5–10.5)

## 2011-12-22 LAB — HEPATIC FUNCTION PANEL
AST: 28 U/L (ref 0–37)
Bilirubin, Direct: 0.1 mg/dL (ref 0.0–0.3)
Total Bilirubin: 0.5 mg/dL (ref 0.3–1.2)

## 2011-12-22 LAB — BASIC METABOLIC PANEL
BUN: 11 mg/dL (ref 6–23)
GFR: 82.2 mL/min (ref >60.00)
Potassium: 5.5 mEq/L — ABNORMAL HIGH (ref 3.5–5.1)
Sodium: 144 mEq/L (ref 135–145)

## 2011-12-22 LAB — TSH: TSH: 1.22 u[IU]/mL (ref 0.35–5.50)

## 2011-12-22 LAB — LIPID PANEL
LDL Cholesterol: 108 mg/dL — ABNORMAL HIGH (ref 0–99)
Total CHOL/HDL Ratio: 3
VLDL: 25.6 mg/dL (ref 0.0–40.0)

## 2011-12-22 NOTE — Patient Instructions (Addendum)
Today we updated your med list in our EPIC system...    Continue your current medications the same...  Today we did your follow up fasting blood work...    We will call you w/ the results when avail...  Call for any problems...  Let's continue our yearly follow up visits.Marland KitchenMarland Kitchen

## 2011-12-22 NOTE — Progress Notes (Signed)
Subjective:    Patient ID: Brenda Hammond, female    DOB: 1932/11/07, 78 y.o.   MRN: [id number]  HPI 78 y/o WF here for a follow up visit... I last saw her 7/08 for an office visit due to rash (she is very allergic to sulfa & was taking Lasix20 for edema)... she refused CPX, routine CXR, EKG, labs; and refused screening colonoscopy, Flu shots, Pneumovax, etc...      Presented to ER 07/13/09 w/ rectal bleeding & colonoscopy by Dorris Singh revealed a sigmoid mass... CT scan was otherw neg (3cm tumor, neg nodes, neg liver) & she had a low anterior resection w/ anastomosis by DrHIngram... path showed adenocarcinoma ?T2-3 N0- neg margins, 11/11 neg nodes, but tumor invasion thru muscularis into pericolonic adipose tissue...  post-op she had AFib & Cards was consulted- she was treated w/ Amio, which has since been stopped in follow up... she had post hosp f/u w/ DrIngram 07/30/09- doing well...  she saw DrSherrill for Oncology 08/12/09- she was given an option for adjuvant chemotherapy vs a research protocol & she chose observation alone...   ~  March 18, 2010:  21mo ROV & due for labs w/ CEA level she wants sent to [facility] & DrSherrill... states she is feeling well- walking daily, good appetite, eating well/ no problems... denies Abd pain, nausea, vomiting, diarrhea (x w/ chinese food), constip, blood, etc... she is not on any perscription meds just taking Calcium, MVI, Vit D, SlowFe...  ~  October 26, 2010:  22mo ROV & continues to do well> no new complaints or concerns, just some fibromyalgia related discomfort for which she takes Tylenol... She is not on any prescription meds... She had f/u colonoscopy 1/12 by DrKaplan> AVM in cecum, clean anastomosis in sigmoid region, no lesions seen (f/u planned 74yrs)... Due for f/u fasting blood work today> see below & she requests CEA= 1.2 today...  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 She had a BMD done 11/12 at [facility] w/ TScores -2.3 in Spine and -0.5 in left Hip & she started on the Alendronate-tol well so far;  She knows to walk for exercise & to help her bones...    She had a follow up eval by DrIngram 4/13> clinically stable, exam was neg, he released her from further follow up; we did f/u CEA=0.7 & copy sent for his review.    We reviewed prob list, meds, xrays and labs> see below>> LABS 6/13:  FLP- at goals x LDL=108;  Chems- ok;  CBC- ok;  TSH=1.22;  VitD=41;  CEA=0.7         Problem List:   ATRIAL FIBRILLATION (ICD-427.31) - this occured post-op colon resection... seen by DrBensimhon for Cards and discharged on Amiodarone 400mg /d... she converted to NSR & the Amio was stopped 3/11... ~  Baseline EKG showed NSR, ?LAE, IVCD, borderline tracing... ~  CXR 1/11 showed mild cardiomeg, interstitial prominence, calcif granuloma, NAD... ~  2DEcho 1/11 showed norm LVF w/ EF= 55-65%, sl incr wall thickness= mild LVH, mild MR, mild LA dil at 29mm... ~  6/13:  Rhythm remains regular>  she denies any irregularity/ CP/ palpit/ etc...  VENOUS INSUFFICIENCY (ICD-459.81) - she knows to restrict sodium, elevate legs, wear support hose, etc...  HYPERLIPIDEMIA (ICD-272.4) - on diet alone, she  has refused statin Rx in the past... ~  FLP 7/08 showed TChol 220, TG 110, HDL 49, LDL 154 ~  FLP 9/11 showed TChol 228, TG 184, HDL 60, LDL 135... she desires to continue diet alone. ~  FLP 4/12 showed TChol 207, TG 114, HDL 65, LDL 120... Actually improved on her diet Rx... ~  FLP 6/13 showed TChol 196, TG 128, HDL 62, LDL 108  Hx of GERD (ICD-530.81) - ?she reports HH & "ulcer" found on UGI series yrs ago... she knows that she can take OTC PPI (Ompeprazole 20mg ) vs H2Blocker Rx (Zantac, Pepcid) whenever she needs it...  Hx of ADENOCARCINOMA, COLON (ICD-153.9) - Presented to ER 07/13/09 w/ rectal  bleeding & colonoscopy by Dorris Singh revealed a sigmoid mass... CT scan was otherw neg (3cm tumor, neg nodes, neg liver) & she had a low anterior resection w/ anastomosis by DrHIngram... path showed adenocarcinoma ?T2-3 N0- neg margins, 11/11 neg nodes, but tumor invasion thru muscularis into pericolonic adipose tissue...  she saw DrSherrill for Oncology 08/12/09- she was given an option for adjuvant chemotherapy vs a research protocol & she chose observation alone...  ~  NOTE:  pre op CEA level 1/11 = 5.2 ~  f/u CEA level 3/11 = 3.5 ~  labs 9/11 showed CEA level = 1.3 ~  F/u colonoscopy 1/12 by DrKaplan revealed an AVM in cecum, sigmoid anastomosis was clean, no other lesions, repeat planned 48yrs. ~  Labs 4/12 showed CEA level = 1.2 ~  Labs 6/13 showed CEA= 0.7  Hx of UTI (ICD-599.0)  DEGENERATIVE JOINT DISEASE (ICD-715.90) >> on TYLENOL ARTHRITIS as needed... FIBROMYALGIA (ICD-729.1) - currently using TYLENOL Prn... symptoms were suggestive of FM & she was referred to Rheum in [facility])... he felt she had scoliosis + chr LBP w/ poss superimposed FM... he rec Skelaxin & Ambien 5mg  (even 1/2 Skelaxin too sedating, & did try the [place])... he rec counselling & phys therapy... she subseq tried Serzone & improved... she had a second opinion consult from DrTruslow in 1997... rec to try Integrative Therapies...  SCOLIOSIS (ICD-737.30) - she was evaluated by a chiropractor in the 90s w/ DDD... subseq MRI was reported to show no herniations or neuro compromise...  OSTEOPENIA >> she had BMD done 11/12 at [facility] w/ TScores -2.3 in Spine and -0.5 in left Hip & she started on the ALENDRONATE 70mg /wk- tol well so far;  She knows to walk for exercise & to help her bones...  POLIOMYELITIS, HX OF (ICD-V12.02) - she had poliomyelitis in the 1950's... it affected her right leg, and she has a long hx of scoliosis... she saw DrLove in 1996 for vertigo & LBP.  ANXIETY (ICD-300.00) - intol to anxiolytic Rx  in the past... she is very religious and "I cope well"...  ANEMIA (ICD-285.9) - Hg was 12.9 on admission 1/11... nadir value in hosp was 9.7.Marland Kitchen. ~  labs 3/11 showed Hg= 11.9, MCV= 89, Fe= 39 (sat11%), B12= 478... rec> OTC Fe + VitC daily... ~  labs 9/11 showed Hg= 12.9, MCV= 91, Fe= 147 (42%sat) ~  Labs 4/12 showed Hg= 12.9, MCV= 90, still on her Iron supplement. ~  Labs 6/13 showed Hg= 12.2, MCV= 90, she has stopped her Iron supplement.  Hx of OTHER DRUG ALLERGY (ICD-995.27) - she has mult medication allergies and sensitivities w/ >10 drugs on the list. ~  intol to HRT after her hyst & BSO... ~  intol to support hose for her VI, edema. ~  Penicillin, Sulfa,  Nsaids: caused rashes ~  Tranxene: single dose caused oversedation. ~  Pamelor, Flexeril, Skelaxin: caused nightmares.  Hx of SURG/OTH PROC NOT CARRIED OUT BECAUSE PTS DECN (ICD-V64.2) - she had repeatedly refused attempts to sched CPX, routine CXR, EKG, lab work, screening colon, f/u GYN checks Hulda Humphrey prev), mammograms, & all vaccinations... ~  1/11:  She presented to the ER w/ rectal bleeding & colonoscopy revealed sigmoid cancer, surg DrIngram, Oncology review by DrSherrill... ~  4/12:  She is requested to contact [facility] for GYN f/u, mammograms, & BMD...   Past Surgical History  Procedure Date  . Low anterior bowel resection 06/2009    with anastomosis for colon cancer  . Total abdominal hysterectomy w/ bilateral salpingoophorectomy 1980s  . Appendectomy   . Other surgical history     T & A    Outpatient Encounter Prescriptions as of 12/22/2011  Medication Sig Dispense Refill  . acetaminophen (TYLENOL) 325 MG tablet Take 1-2 tabs by mouth every 6-8 H as needed for pain       . alendronate (FOSAMAX) 70 MG tablet Take 1 tablet (70 mg total) by mouth every 7 (seven) days. Take with a full glass of water on an empty stomach.  4 tablet  11  . Calcium Carbonate-Vit D-Min 600-400 MG-UNIT TABS Take 1 tablet by mouth 2 (two) times  daily.        . Multiple Vitamins-Calcium (ONE-A-DAY WOMENS PO) Take 1 tablet by mouth daily.        Marland Kitchen DISCONTD: ferrous sulfate dried (SLOW FE) 160 (50 FE) MG TBCR Take 160 mg by mouth daily.        Marland Kitchen DISCONTD: vitamin E 400 UNIT capsule Take 400 Units by mouth daily.          Allergies  Allergen Reactions  . Ampicillin     REACTION: rash and high fever  . Aspirin     REACTION: ears ringing  . Cyclobenzaprine Hcl     REACTION: nightmares  . Metaxalone     REACTION: nightmares  . Nitrofurantoin     REACTION: elevated BP and chest pain  . Nortriptyline Hcl   . Nsaids     REACTION: rash  . Penicillins     REACTION: rash, high fever  . Sulfonamide Derivatives     REACTION: elevated HR and BP    Review of Systems         See HPI - all other systems neg except as noted... The patient denies anorexia, fever, weight loss, weight gain, vision loss, decreased hearing, hoarseness, chest pain, syncope, dyspnea on exertion, peripheral edema, prolonged cough, headaches, hemoptysis, abdominal pain, melena, hematochezia, severe indigestion/heartburn, hematuria, incontinence, muscle weakness, suspicious skin lesions, transient blindness, difficulty walking, depression, unusual weight change, abnormal bleeding, enlarged lymph nodes, and angioedema.     Objective:   Physical Exam      WD, Thin, 78 y/o WF in NAD... GENERAL:  Alert & oriented; pleasant & cooperative. HEENT:  [place]/AT, EOM-wnl, PERRLA, EACs-clear, TMs-wnl, NOSE-clear, THROAT-clear & wnl. NECK:  Supple w/ fairROM; no JVD; normal carotid impulses w/o bruits; no thyromegaly or nodules palpated; no lymphadenopathy. CHEST:  Clear to P & A; without wheezes/ rales/ or rhonchi. HEART:  Regular Rhythm; without murmurs/ rubs/ or gallops. ABDOMEN:  Soft & nontender, s/p surg; normal bowel sounds; no organomegaly or masses detected. EXT: without deformities or arthritic changes; no varicose veins/ venous insuffic/ or edema. NEURO:  CN's  intact; motor testing normal; sensory testing normal; gait normal &  balance OK. DERM:  No lesions noted; no rash etc...  RADIOLOGY DATA:  Reviewed in the EPIC EMR & discussed w/ the patient...  LABORATORY DATA:  Reviewed in the EPIC EMR & discussed w/ the patient...   Assessment & Plan:   Hx PAF>  She remains regular w/o CP, palpit/ SOB/ edema...  Hyperlipid>  FLP much improved on her diet management w/o medication which is the way she likes it...  GERD>  She knows to use Prilosec vs Zantac as needed for reflux symptoms...  Hx colon cancer>  She had f/u colonoscopy 1/12 by DrKaplan, no obvious recurrence;  CEA today = 0.7  DJD/ Scoliosis/ FM>  She uses rest, heat, tylenol, etc...  Osteopenia>  TScore was -2.3 in Spine & L1 individually was -2.8; stated FOSAMAX 70mg /d along w/ Calcium, MVI, Vit D (level= 41 today)  Other medical issues as noted...   Patient's Medications  New Prescriptions   No medications on file  Previous Medications   ACETAMINOPHEN (TYLENOL) 325 MG TABLET    Take 1-2 tabs by mouth every 6-8 H as needed for pain    ALENDRONATE (FOSAMAX) 70 MG TABLET    Take 1 tablet (70 mg total) by mouth every 7 (seven) days. Take with a full glass of water on an empty stomach.   CALCIUM CARBONATE-VIT D-MIN 600-400 MG-UNIT TABS    Take 1 tablet by mouth 2 (two) times daily.     MULTIPLE VITAMINS-CALCIUM (ONE-A-DAY WOMENS PO)    Take 1 tablet by mouth daily.    Modified Medications   No medications on file  Discontinued Medications   FERROUS SULFATE DRIED (SLOW FE) 160 (50 FE) MG TBCR    Take 160 mg by mouth daily.     VITAMIN E 400 UNIT CAPSULE    Take 400 Units by mouth daily.

## 2012-05-22 ENCOUNTER — Other Ambulatory Visit: Payer: Self-pay | Admitting: Pulmonary Disease

## 2012-12-06 ENCOUNTER — Encounter: Payer: Self-pay | Admitting: Pulmonary Disease

## 2012-12-06 ENCOUNTER — Other Ambulatory Visit (INDEPENDENT_AMBULATORY_CARE_PROVIDER_SITE_OTHER): Payer: Medicare Other

## 2012-12-06 ENCOUNTER — Ambulatory Visit (INDEPENDENT_AMBULATORY_CARE_PROVIDER_SITE_OTHER): Payer: Medicare Other | Admitting: Pulmonary Disease

## 2012-12-06 VITALS — BP 110/72 | HR 68 | Temp 98.1°F | Ht 61.0 in | Wt 112.6 lb

## 2012-12-06 DIAGNOSIS — M1990 Unspecified osteoarthritis, unspecified site: Secondary | ICD-10-CM

## 2012-12-06 DIAGNOSIS — D649 Anemia, unspecified: Secondary | ICD-10-CM

## 2012-12-06 DIAGNOSIS — C189 Malignant neoplasm of colon, unspecified: Secondary | ICD-10-CM

## 2012-12-06 DIAGNOSIS — M545 Low back pain, unspecified: Secondary | ICD-10-CM

## 2012-12-06 DIAGNOSIS — E785 Hyperlipidemia, unspecified: Secondary | ICD-10-CM

## 2012-12-06 DIAGNOSIS — M949 Disorder of cartilage, unspecified: Secondary | ICD-10-CM

## 2012-12-06 DIAGNOSIS — I4891 Unspecified atrial fibrillation: Secondary | ICD-10-CM

## 2012-12-06 DIAGNOSIS — E559 Vitamin D deficiency, unspecified: Secondary | ICD-10-CM

## 2012-12-06 DIAGNOSIS — M8580 Other specified disorders of bone density and structure, unspecified site: Secondary | ICD-10-CM | POA: Insufficient documentation

## 2012-12-06 DIAGNOSIS — IMO0001 Reserved for inherently not codable concepts without codable children: Secondary | ICD-10-CM

## 2012-12-06 DIAGNOSIS — K219 Gastro-esophageal reflux disease without esophagitis: Secondary | ICD-10-CM

## 2012-12-06 DIAGNOSIS — M4120 Other idiopathic scoliosis, site unspecified: Secondary | ICD-10-CM

## 2012-12-06 DIAGNOSIS — F411 Generalized anxiety disorder: Secondary | ICD-10-CM

## 2012-12-06 LAB — BASIC METABOLIC PANEL
CO2: 29 mEq/L (ref 19–32)
Calcium: 9.4 mg/dL (ref 8.4–10.5)
Chloride: 106 mEq/L (ref 96–112)
Creatinine, Ser: 0.8 mg/dL (ref 0.4–1.2)
Glucose, Bld: 86 mg/dL (ref 70–99)
Sodium: 142 mEq/L (ref 135–145)

## 2012-12-06 LAB — LIPID PANEL
Cholesterol: 212 mg/dL — ABNORMAL HIGH (ref 0–200)
Total CHOL/HDL Ratio: 3
Triglycerides: 149 mg/dL (ref 0.0–149.0)

## 2012-12-06 LAB — CBC WITH DIFFERENTIAL/PLATELET
Basophils Absolute: 0 10*3/uL (ref 0.0–0.1)
Eosinophils Absolute: 0.2 10*3/uL (ref 0.0–0.7)
Lymphocytes Relative: 18.9 % (ref 12.0–46.0)
MCHC: 33.7 g/dL (ref 30.0–36.0)
Monocytes Relative: 7 % (ref 3.0–12.0)
Neutrophils Relative %: 71.1 % (ref 43.0–77.0)
RDW: 13.7 % (ref 11.5–14.6)

## 2012-12-06 LAB — HEPATIC FUNCTION PANEL
ALT: 16 U/L (ref 0–35)
Albumin: 3.9 g/dL (ref 3.5–5.2)
Alkaline Phosphatase: 68 U/L (ref 39–117)
Bilirubin, Direct: 0 mg/dL (ref 0.0–0.3)
Total Protein: 7.1 g/dL (ref 6.0–8.3)

## 2012-12-06 NOTE — Patient Instructions (Addendum)
Today we updated your med list in our EPIC system...    Continue your current medications the same...  Today we did your follow up FASTING blood work...    We will contact you w/ the results when available...   Please collect the stool specimens so that we may check them for signs of hidden blood...  Call for any questions...  Let's plan a follow up visit in 59yr, sooner if needed for problems.Marland KitchenMarland Kitchen

## 2012-12-06 NOTE — Progress Notes (Signed)
Subjective:    Patient ID: Brenda Hammond, female    DOB: 03-08-1933, 79 y.o.   MRN: [id number]  HPI 79 y/o WF here for a follow up visit... I last saw her 7/08 for an office visit due to rash (she is very allergic to sulfa & was taking Lasix20 for edema)... she refused CPX, routine CXR, EKG, labs; and refused screening colonoscopy, Flu shots, Pneumovax, etc...      Presented to ER 07/13/09 w/ rectal bleeding & colonoscopy by Dorris Singh revealed a sigmoid mass... CT scan was otherw neg (3cm tumor, neg nodes, neg liver) & she had a low anterior resection w/ anastomosis by DrHIngram... path showed adenocarcinoma ?T2-3 N0- neg margins, 11/11 neg nodes, but tumor invasion thru muscularis into pericolonic adipose tissue...  post-op she had AFib & Cards was consulted- she was treated w/ Amio, which has since been stopped in follow up... she had post hosp f/u w/ DrIngram 07/30/09- doing well...  she saw DrSherrill for Oncology 08/12/09- she was given an option for adjuvant chemotherapy vs a research protocol & she chose observation alone...   ~  March 18, 2010:  21mo ROV & due for labs w/ CEA level she wants sent to [facility] & DrSherrill... states she is feeling well- walking daily, good appetite, eating well/ no problems... denies Abd pain, nausea, vomiting, diarrhea (x w/ chinese food), constip, blood, etc... she is not on any perscription meds just taking Calcium, MVI, Vit D, SlowFe...  ~  October 26, 2010:  31mo ROV & continues to do well> no new complaints or concerns, just some fibromyalgia related discomfort for which she takes Tylenol... She is not on any prescription meds... She had f/u colonoscopy 1/12 by DrKaplan> AVM in cecum, clean anastomosis in sigmoid region, no lesions seen (f/u planned 26yrs)... Due for f/u fasting blood work today> see below & she requests CEA= 1.2 today...  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 She had a BMD done 11/12 at [facility] w/ TScores -2.3 in Spine and -0.5 in left Hip & she started on the Alendronate-tol well so far;  She knows to walk for exercise & to help her bones...    She had a follow up eval by DrIngram 4/13> clinically stable, exam was neg, he released her from further follow up; we did f/u CEA=0.7 & copy sent for his review.    We reviewed prob list, meds, xrays and labs> see below>> LABS 6/13:  FLP- at goals x LDL=108;  Chems- ok;  CBC- ok;  TSH=1.22;  VitD=41;  CEA=0.7  ~  December 06, 2012:  68yr ROV & Brenda Hammond reports a good yr, doing well, no new complaints or concerns, she is still c/o her aches & pains from the FM (uses OTC analgesics as needed); she is not on any prescription medications and she likes it this way... We reviewed the following medical problems during today's office visit >>     PAF> this occurred post op colon resection; converted to NSR on Amio & has maintained NSR off meds since 2011; rhythm is reg & BP= 110/72; she denies CP, palpit, SOB, edema, etc...    Ven Insuffic> she knows to elim salt, elev legs, wear support hose as needed...    Hyperlipid> on diet alone, she has  refused med rx; FLP 6/14 shows TChol 212, TG 149, HDL 61, LDL 124    GI- GERD, Hx colon cancer> she had refused routine colon etc; presented to ER 1/11 w/ rectal bleeding & eval by Dorris Singh showed sigmoid mass- surg by Lurline Hare, seen by DrSherrill & she declined adjuvant chemoRx; CEA at Dx=5.2, lab 6/14=1.0.Marland KitchenMarland Kitchen     DJD, FM, Scoliosis> her CC = same old aches & pains c/w FM but she prefers just OTC analgesics as needed...    Osteopenia> she stopped her Alendronate70 due to gas & "it made my heart flutter" (added to long list of med intolerances); she refused to consider other meds due to cost issues etc...    Hx polio> she had polio in the 1950's, it affected her right leg, she has long hx scoliosis; she  doesn't "get it" that vaccines prevent disease & she refuses all adult vaccinations...    Anxiety> she was intol to anxiolytic rx in the past; she is very religious w/ a strong faith...    Hx anemia> Hg nadir in 2011 was 9.7; since then in the 12 range & lab 6/14 shows Hg= 12.4    Hx mult drug sensitivities> see long list below... We reviewed prob list, meds, xrays and labs> see below for updates >>  LABS 6/14:  FLP- not at goals on diet alone, she refuses med Rx;  Chems- wnl;  CBC- wnl w/ Hg=12.4;  TSH- not done;  VitD=43;  CEA=1.0.Marland KitchenMarland Kitchen Stool cards- pending...          Problem List:   ATRIAL FIBRILLATION (ICD-427.31) - this occured post-op colon resection... seen by DrBensimhon for Cards and discharged on Amiodarone 400mg /d... she converted to NSR & the Amio was stopped 3/11... ~  Baseline EKG showed NSR, ?LAE, IVCD, borderline tracing... ~  CXR 1/11 showed mild cardiomeg, interstitial prominence, calcif granuloma, NAD... ~  2DEcho 1/11 showed norm LVF w/ EF= 55-65%, sl incr wall thickness= mild LVH, mild MR, mild LA dil at 29mm... ~  6/13:  Rhythm remains regular>  she denies any irregularity/ CP/ palpit/ etc... ~  6/14:  this occurred post op colon resection; converted to NSR on Amio & has maintained NSR off meds since 2011; rhythm is reg & BP= 110/72; she denies CP, palpit, SOB, edema, etc  VENOUS INSUFFICIENCY (ICD-459.81) - she knows to restrict sodium, elevate legs, wear support hose, etc...  HYPERLIPIDEMIA (ICD-272.4) - on diet alone, she has refused statin Rx in the past... ~  FLP 7/08 showed TChol 220, TG 110, HDL 49, LDL 154 ~  FLP 9/11 showed TChol 228, TG 184, HDL 60, LDL 135... she desires to continue diet alone. ~  FLP 4/12 showed TChol 207, TG 114, HDL 65, LDL 120... Actually improved on her diet Rx... ~  FLP 6/13 showed TChol 196, TG 128, HDL 62, LDL 108 ~  FLP 6/14 showed TChol 212, TG 149, HDL 61, LDL 124   Hx of GERD (ICD-530.81) - ?she reports HH & "ulcer" found on UGI  series yrs ago... she knows that she can take OTC PPI (Ompeprazole 20mg ) vs H2Blocker Rx (Zantac, Pepcid) whenever she needs it...  Hx of ADENOCARCINOMA, COLON (ICD-153.9) - Presented to ER 07/13/09 w/ rectal bleeding & colonoscopy by Dorris Singh revealed a sigmoid mass... CT scan was otherw neg (3cm tumor, neg nodes, neg liver) & she had a low anterior resection w/ anastomosis by DrHIngram... path showed adenocarcinoma ?T2-3 N0- neg margins, 11/11 neg nodes, but tumor invasion thru muscularis into  pericolonic adipose tissue...  she saw DrSherrill for Oncology 08/12/09- she was given an option for adjuvant chemotherapy vs a research protocol & she chose observation alone...  ~  NOTE:  pre op CEA level 1/11 = 5.2 ~  f/u CEA level 3/11 = 3.5 ~  labs 9/11 showed CEA level = 1.3 ~  F/u colonoscopy 1/12 by DrKaplan revealed an AVM in cecum, sigmoid anastomosis was clean, no other lesions, repeat planned 26yrs. ~  Labs 4/12 showed CEA level = 1.2 ~  Labs 6/13 showed CEA= 0.7 ~  Labs 6/14 showed CEA= 1.0  Hx of UTI (ICD-599.0)  DEGENERATIVE JOINT DISEASE (ICD-715.90) >> on TYLENOL ARTHRITIS as needed... FIBROMYALGIA (ICD-729.1) - currently using TYLENOL Prn... symptoms were suggestive of FM & she was referred to Rheum in 1996 [facility])... he felt she had scoliosis + chr LBP w/ poss superimposed FM... he rec Skelaxin & Ambien 5mg  (even 1/2 Skelaxin too sedating, & did try the [place])... he rec counselling & phys therapy... she subseq tried Serzone & improved... she had a second opinion consult from DrTruslow in 1997... rec to try Integrative Therapies...  SCOLIOSIS (ICD-737.30) - she was evaluated by a chiropractor in the 90s w/ DDD... subseq MRI was reported to show no herniations or neuro compromise...  OSTEOPENIA >> she had BMD done 11/12 at [facility] w/ TScores -2.3 in Spine and -0.5 in left Hip & she started on the ALENDRONATE 70mg /wk- tol well so far;  She knows to walk for exercise & to help her  bones... ~  Labs 6/13 showed Vit D level = 41 and rec to take OTC VitD supplement... ~  6/14:  She reports that she stopped the Alendronate on her own because it caused gas & made her heart flutter, she says...  POLIOMYELITIS, HX OF (ICD-V12.02) - she had poliomyelitis in the 1950's... it affected her right leg, and she has a long hx of scoliosis... she saw DrLove in 1996 for vertigo & LBP.  ANXIETY (ICD-300.00) - intol to anxiolytic Rx in the past... she is very religious and "I cope well"...  ANEMIA (ICD-285.9) - Hg was 12.9 on admission 1/11... nadir value in hosp was 9.7.Marland Kitchen. ~  labs 3/11 showed Hg= 11.9, MCV= 89, Fe= 39 (sat11%), B12= 478... rec> OTC Fe + VitC daily... ~  labs 9/11 showed Hg= 12.9, MCV= 91, Fe= 147 (42%sat) ~  Labs 4/12 showed Hg= 12.9, MCV= 90, still on her Iron supplement. ~  Labs 6/13 showed Hg= 12.2, MCV= 90, she has stopped her Iron supplement. ~  Labs 6/14 showed Hg= 12.4  Hx of OTHER DRUG ALLERGY (ICD-995.27) - she has mult medication allergies and sensitivities w/ >10 drugs on the list. ~  intol to HRT after her hyst & BSO... ~  intol to support hose for her VI, edema. ~  Penicillin, Sulfa, Nsaids: caused rashes ~  Tranxene: single dose caused oversedation. ~  Pamelor, Flexeril, Skelaxin: caused nightmares. ~  Alendronate caused gas & "it made my heart flutter" per pt.  Hx of SURG/OTH PROC NOT CARRIED OUT BECAUSE PTS DECN (ICD-V64.2) - she had repeatedly refused attempts to sched CPX, routine CXR, EKG, lab work, screening colon, f/u GYN checks Brenda Hammond prev), mammograms, & all vaccinations... ~  1/11:  She presented to the ER w/ rectal bleeding & colonoscopy revealed sigmoid cancer, surg DrIngram, Oncology review by DrSherrill... ~  4/12:  She is requested to contact [facility] for GYN f/u, mammograms, & BMD...   Past Surgical History  Procedure Laterality Date  . Low anterior bowel resection  06/2009    with anastomosis for colon cancer  . Total abdominal  hysterectomy w/ bilateral salpingoophorectomy  1980s  . Appendectomy    . Other surgical history      T & A    Outpatient Encounter Prescriptions as of 12/06/2012  Medication Sig Dispense Refill  . acetaminophen (TYLENOL) 650 MG CR tablet Take 650 mg by mouth every 8 (eight) hours as needed for pain.      . Calcium Carbonate-Vit D-Min 600-400 MG-UNIT TABS Take 1 tablet by mouth 2 (two) times daily.        . Multiple Vitamins-Calcium (ONE-A-DAY WOMENS PO) Take 1 tablet by mouth daily.        Marland Kitchen alendronate (FOSAMAX) 70 MG tablet TAKE 1 TABLET BY MOUTH EVERY 7 DAYS WITH A FULL GLASS OF WATER ON AN EMPTY STOMACH  4 tablet  5  . [DISCONTINUED] acetaminophen (TYLENOL) 325 MG tablet Take 1-2 tabs by mouth every 6-8 H as needed for pain        No facility-administered encounter medications on file as of 12/06/2012.    Allergies  Allergen Reactions  . Ampicillin     REACTION: rash and high fever  . Aspirin     REACTION: ears ringing  . Cyclobenzaprine Hcl     REACTION: nightmares  . Metaxalone     REACTION: nightmares  . Nitrofurantoin     REACTION: elevated BP and chest pain  . Nortriptyline Hcl   . Nsaids     REACTION: rash  . Penicillins     REACTION: rash, high fever  . Sulfonamide Derivatives     REACTION: elevated HR and BP    Review of Systems        See HPI - all other systems neg except as noted... The patient denies anorexia, fever, weight loss, weight gain, vision loss, decreased hearing, hoarseness, chest pain, syncope, dyspnea on exertion, peripheral edema, prolonged cough, headaches, hemoptysis, abdominal pain, melena, hematochezia, severe indigestion/heartburn, hematuria, incontinence, muscle weakness, suspicious skin lesions, transient blindness, difficulty walking, depression, unusual weight change, abnormal bleeding, enlarged lymph nodes, and angioedema.     Objective:   Physical Exam      WD, Thin, 79 y/o WF in NAD... GENERAL:  Alert & oriented; pleasant &  cooperative. HEENT:  [place]/AT, EOM-wnl, PERRLA, EACs-clear, TMs-wnl, NOSE-clear, THROAT-clear & wnl. NECK:  Supple w/ fairROM; no JVD; normal carotid impulses w/o bruits; no thyromegaly or nodules palpated; no lymphadenopathy. CHEST:  Clear to P & A; without wheezes/ rales/ or rhonchi. HEART:  Regular Rhythm; without murmurs/ rubs/ or gallops. ABDOMEN:  Soft & nontender, s/p surg; normal bowel sounds; no organomegaly or masses detected. EXT: without deformities or arthritic changes; no varicose veins/ venous insuffic/ or edema. NEURO:  CN's intact; motor testing normal; sensory testing normal; gait normal & balance OK. DERM:  No lesions noted; no rash etc...  RADIOLOGY DATA:  Reviewed in the EPIC EMR & discussed w/ the patient...  LABORATORY DATA:  Reviewed in the EPIC EMR & discussed w/ the patient...   Assessment & Plan:    Hx PAF>  She remains regular w/o CP, palpit/ SOB/ edema...  Hyperlipid>  FLP much improved but not at goals on her diet management w/o medication which is the way she likes it...  GERD>  She knows to use Prilosec vs Zantac as needed for reflux symptoms...  Hx colon cancer>  She had f/u colonoscopy 1/12 by Dorris Singh,  no obvious recurrence;  CEA today = 1.0  DJD/ Scoliosis/ FM>  She uses rest, heat, tylenol, etc...  Osteopenia>  TScore was -2.3 in Spine & L1 individually was -2.8; stated FOSAMAX 70mg /d along w/ Calcium, MVI, Vit D but she has since stopped the Alendronate & refuses med rx...   Other medical issues as noted...   Patient's Medications  New Prescriptions   No medications on file  Previous Medications   ACETAMINOPHEN (TYLENOL) 650 MG CR TABLET    Take 650 mg by mouth every 8 (eight) hours as needed for pain.   ALENDRONATE (FOSAMAX) 70 MG TABLET    TAKE 1 TABLET BY MOUTH EVERY 7 DAYS WITH A FULL GLASS OF WATER ON AN EMPTY STOMACH   CALCIUM CARBONATE-VIT D-MIN 600-400 MG-UNIT TABS    Take 1 tablet by mouth 2 (two) times daily.     CHOLECALCIFEROL  (VITAMIN D) 1000 UNITS TABLET    Take 1,000 Units by mouth daily.   MULTIPLE VITAMINS-CALCIUM (ONE-A-DAY WOMENS PO)    Take 1 tablet by mouth daily.    Modified Medications   No medications on file  Discontinued Medications   ACETAMINOPHEN (TYLENOL) 325 MG TABLET    Take 1-2 tabs by mouth every 6-8 H as needed for pain

## 2012-12-07 LAB — CEA: CEA: 1 ng/mL (ref 0.0–5.0)

## 2012-12-07 LAB — VITAMIN D 25 HYDROXY (VIT D DEFICIENCY, FRACTURES): Vit D, 25-Hydroxy: 43 ng/mL (ref 30–89)

## 2013-06-11 ENCOUNTER — Encounter: Payer: Self-pay | Admitting: Gastroenterology

## 2013-07-04 ENCOUNTER — Encounter: Payer: Self-pay | Admitting: Gastroenterology

## 2013-08-08 ENCOUNTER — Ambulatory Visit (AMBULATORY_SURGERY_CENTER): Payer: Self-pay | Admitting: *Deleted

## 2013-08-08 VITALS — Ht 61.0 in | Wt 113.8 lb

## 2013-08-08 DIAGNOSIS — Z85038 Personal history of other malignant neoplasm of large intestine: Secondary | ICD-10-CM

## 2013-08-08 MED ORDER — NA SULFATE-K SULFATE-MG SULF 17.5-3.13-1.6 GM/177ML PO SOLN
ORAL | Status: DC
Start: 2013-08-08 — End: 2013-08-22

## 2013-08-08 NOTE — Progress Notes (Signed)
Patient denies any allergies to eggs or soy. Patient denies any problems with anesthesia.  

## 2013-08-14 ENCOUNTER — Encounter: Payer: Self-pay | Admitting: Gastroenterology

## 2013-08-22 ENCOUNTER — Encounter: Payer: Self-pay | Admitting: Gastroenterology

## 2013-08-22 ENCOUNTER — Ambulatory Visit (AMBULATORY_SURGERY_CENTER): Payer: Medicare Other | Admitting: Gastroenterology

## 2013-08-22 VITALS — BP 160/75 | HR 82 | Temp 98.6°F | Resp 14 | Ht 61.0 in | Wt 113.0 lb

## 2013-08-22 DIAGNOSIS — Z85038 Personal history of other malignant neoplasm of large intestine: Secondary | ICD-10-CM

## 2013-08-22 DIAGNOSIS — K5520 Angiodysplasia of colon without hemorrhage: Secondary | ICD-10-CM

## 2013-08-22 DIAGNOSIS — D126 Benign neoplasm of colon, unspecified: Secondary | ICD-10-CM

## 2013-08-22 MED ORDER — SODIUM CHLORIDE 0.9 % IV SOLN: 500.0000 mL | INTRAVENOUS | Status: DC

## 2013-08-22 NOTE — Patient Instructions (Signed)
YOU HAD AN ENDOSCOPIC PROCEDURE TODAY AT THE [facility]: Refer to the procedure report that was given to you for any specific questions about what was found during the examination.  If the procedure report does not answer your questions, please call your gastroenterologist to clarify.  If you requested that your care partner not be given the details of your procedure findings, then the procedure report has been included in a sealed envelope for you to review at your convenience later.  YOU SHOULD EXPECT: Some feelings of bloating in the abdomen. Passage of more gas than usual.  Walking can help get rid of the air that was put into your GI tract during the procedure and reduce the bloating. If you had a lower endoscopy (such as a colonoscopy or flexible sigmoidoscopy) you may notice spotting of blood in your stool or on the toilet paper. If you underwent a bowel prep for your procedure, then you may not have a normal bowel movement for a few days.  DIET: Your first meal following the procedure should be a light meal and then it is ok to progress to your normal diet.  A half-sandwich or bowl of soup is an example of a good first meal.  Heavy or fried foods are harder to digest and may make you feel nauseous or bloated.  Likewise meals heavy in dairy and vegetables can cause extra gas to form and this can also increase the bloating.  Drink plenty of fluids but you should avoid alcoholic beverages for 24 hours.  ACTIVITY: Your care partner should take you home directly after the procedure.  You should plan to take it easy, moving slowly for the rest of the day.  You can resume normal activity the day after the procedure however you should NOT DRIVE or use heavy machinery for 24 hours (because of the sedation medicines used during the test).    SYMPTOMS TO REPORT IMMEDIATELY: A gastroenterologist can be reached at any hour.  During normal business hours, 8:30 AM to 5:00 PM Monday through Friday,  call [phone].  After hours and on weekends, please call the GI answering service at [phone] who will take a message and have the physician on call contact you.   Following lower endoscopy (colonoscopy or flexible sigmoidoscopy):  Excessive amounts of blood in the stool  Significant tenderness or worsening of abdominal pains  Swelling of the abdomen that is new, acute  Fever of 100F or higher  FOLLOW UP: If any biopsies were taken you will be contacted by phone or by letter within the next 1-3 weeks.  Call your gastroenterologist if you have not heard about the biopsies in 3 weeks.  Our staff will call the home number listed on your records the next business day following your procedure to check on you and address any questions or concerns that you may have at that time regarding the information given to you following your procedure. This is a courtesy call and so if there is no answer at the home number and we have not heard from you through the emergency physician on call, we will assume that you have returned to your regular daily activities without incident.  SIGNATURES/CONFIDENTIALITY: You and/or your care partner have signed paperwork which will be entered into your electronic medical record.  These signatures attest to the fact that that the information above on your After Visit Summary has been reviewed and is understood.  Full responsibility of the confidentiality of this   discharge information lies with you and/or your care-partner.    Resume medications. Information given on polyps with discharge instructions. 

## 2013-08-22 NOTE — Op Note (Signed)
[place]  [address] [place], [id number]   COLONOSCOPY PROCEDURE REPORT  PATIENT: [name], [name]  MR#: [id number] BIRTHDATE: 22-May-1933 , 67  yrs. old GENDER: Female ENDOSCOPIST: Inda Castle, MD REFERRED TG:GYIRS Lenna Gilford, M.D. PROCEDURE DATE:  08/22/2013 PROCEDURE:   Colonoscopy with snare polypectomy First Screening Colonoscopy - Avg.  risk and is 50 yrs.  old or older - No.  Prior Negative Screening - Now for repeat screening. N/A  History of Adenoma - Now for follow-up colonoscopy & has been > or = to 3 yrs.  No.  It has been less than 3 yrs since last colonoscopy.  Other: See Comments  Polyps Removed Today? Yes. ASA CLASS:   Class II INDICATIONS:High risk patient with personal history of colon cancer. 2011 MEDICATIONS: MAC sedation, administered by CRNA and propofol (Diprivan) 150mg  IV  DESCRIPTION OF PROCEDURE:   After the risks benefits and alternatives of the procedure were thoroughly explained, informed consent was obtained.  A digital rectal exam revealed no abnormalities of the rectum.   The LB [id number]  endoscope was introduced through the anus and advanced to the cecum, which was identified by both the appendix and ileocecal valve. No adverse events experienced.   The quality of the prep was Suprep good  The instrument was then slowly withdrawn as the colon was fully examined.      COLON FINDINGS: Surgical anastomosis was seen in the rectosigmoidIn the cecum there was a single AVM measuring 3 cm.  There was no fresh or old blood.Diverticulosis was noted at the cecum.   Three In the sigmoid there was a cluster of 3 sessile 2 mm polyps.  These were removed with polypectomy snare and submitted to pathology. polyps were found.   The colon mucosa was otherwise normal. Retroflexed views revealed no abnormalities. The time to cecum=2 minutes 42 seconds.  Withdrawal time=7 minutes 35 seconds.  The scope was withdrawn and the  procedure completed. COMPLICATIONS: There were no complications.  ENDOSCOPIC IMPRESSION: 1.  cecal AVM 2.  sigmoid polyp  RECOMMENDATIONS: Routine colorectal cancer screening stops around age 34.  I will leave it to you and your primary care physician to decide whether or not to proceed with followup colonoscopy in 5 years.  eSigned:  Inda Castle, MD 08/22/2013 1:56 PM   cc:   PATIENT NAME:  Brenda Hammond, Brenda Hammond MR#: [id number]

## 2013-08-22 NOTE — Progress Notes (Signed)
Lidocaine-40mg IV prior to Propofol InductionPropofol given over incremental dosages 

## 2013-08-22 NOTE — Progress Notes (Signed)
Called to room to assist during endoscopic procedure.  Patient ID and intended procedure confirmed with present staff. Received instructions for my participation in the procedure from the performing physician.  

## 2013-08-24 ENCOUNTER — Telehealth: Payer: Self-pay | Admitting: *Deleted

## 2013-08-24 NOTE — Telephone Encounter (Signed)
  Follow up Call-  Call back number 08/22/2013  Post procedure Call Back phone  # [phone]  Permission to leave phone message Yes     Patient questions:  Do you have a fever, pain , or abdominal swelling? no Pain Score  0 *  Have you tolerated food without any problems? yes  Have you been able to return to your normal activities? yes  Do you have any questions about your discharge instructions: Diet   no Medications  no Follow up visit  no  Do you have questions or concerns about your Care? no  Actions: * If pain score is 4 or above: No action needed, pain <4.

## 2013-08-28 ENCOUNTER — Encounter: Payer: Self-pay | Admitting: Gastroenterology

## 2013-08-29 ENCOUNTER — Encounter: Payer: Self-pay | Admitting: *Deleted

## 2013-12-04 ENCOUNTER — Telehealth: Payer: Self-pay | Admitting: Pulmonary Disease

## 2013-12-04 NOTE — Telephone Encounter (Signed)
Spoke with spouse and notified we will have to check and see about this appt  Leigh, please advise, thanks!

## 2013-12-04 NOTE — Telephone Encounter (Signed)
lmtcb

## 2013-12-06 NOTE — Telephone Encounter (Signed)
Pt is calling back about wanting appt with Dr Lenna Gilford.

## 2013-12-06 NOTE — Telephone Encounter (Signed)
SN not back until next week.  Pt spouse aware

## 2013-12-07 NOTE — Telephone Encounter (Signed)
Called and spoke with pt and she is aware of appt with SN on 8-12.  Pt will come in fasting.

## 2014-02-06 ENCOUNTER — Other Ambulatory Visit (HOSPITAL_COMMUNITY): Payer: Self-pay

## 2014-02-06 ENCOUNTER — Ambulatory Visit (HOSPITAL_COMMUNITY): Payer: Medicare Other | Attending: Pulmonary Disease | Admitting: Cardiology

## 2014-02-06 ENCOUNTER — Ambulatory Visit (INDEPENDENT_AMBULATORY_CARE_PROVIDER_SITE_OTHER): Payer: Medicare Other | Admitting: Pulmonary Disease

## 2014-02-06 ENCOUNTER — Other Ambulatory Visit (INDEPENDENT_AMBULATORY_CARE_PROVIDER_SITE_OTHER): Payer: Medicare Other

## 2014-02-06 VITALS — BP 138/62 | HR 75 | Temp 97.8°F | Ht 60.75 in | Wt 115.4 lb

## 2014-02-06 DIAGNOSIS — E785 Hyperlipidemia, unspecified: Secondary | ICD-10-CM | POA: Insufficient documentation

## 2014-02-06 DIAGNOSIS — M7989 Other specified soft tissue disorders: Secondary | ICD-10-CM

## 2014-02-06 DIAGNOSIS — I872 Venous insufficiency (chronic) (peripheral): Secondary | ICD-10-CM

## 2014-02-06 DIAGNOSIS — E559 Vitamin D deficiency, unspecified: Secondary | ICD-10-CM

## 2014-02-06 DIAGNOSIS — F411 Generalized anxiety disorder: Secondary | ICD-10-CM

## 2014-02-06 DIAGNOSIS — M8580 Other specified disorders of bone density and structure, unspecified site: Secondary | ICD-10-CM

## 2014-02-06 DIAGNOSIS — C189 Malignant neoplasm of colon, unspecified: Secondary | ICD-10-CM

## 2014-02-06 DIAGNOSIS — Z8612 Personal history of poliomyelitis: Secondary | ICD-10-CM

## 2014-02-06 DIAGNOSIS — M949 Disorder of cartilage, unspecified: Secondary | ICD-10-CM

## 2014-02-06 DIAGNOSIS — M1990 Unspecified osteoarthritis, unspecified site: Secondary | ICD-10-CM

## 2014-02-06 DIAGNOSIS — K219 Gastro-esophageal reflux disease without esophagitis: Secondary | ICD-10-CM

## 2014-02-06 DIAGNOSIS — M79661 Pain in right lower leg: Secondary | ICD-10-CM

## 2014-02-06 DIAGNOSIS — M899 Disorder of bone, unspecified: Secondary | ICD-10-CM

## 2014-02-06 DIAGNOSIS — IMO0001 Reserved for inherently not codable concepts without codable children: Secondary | ICD-10-CM

## 2014-02-06 LAB — LIPID PANEL
CHOL/HDL RATIO: 3
Cholesterol: 225 mg/dL — ABNORMAL HIGH (ref 0–200)
HDL: 68.7 mg/dL (ref >39.00)
LDL Cholesterol: 134 mg/dL — ABNORMAL HIGH (ref 0–99)
NONHDL: 156.3
Triglycerides: 111 mg/dL (ref 0.0–149.0)
VLDL: 22.2 mg/dL (ref 0.0–40.0)

## 2014-02-06 LAB — BASIC METABOLIC PANEL
BUN: 10 mg/dL (ref 6–23)
CHLORIDE: 102 meq/L (ref 96–112)
CO2: 31 mEq/L (ref 19–32)
Calcium: 9.8 mg/dL (ref 8.4–10.5)
Creatinine, Ser: 0.7 mg/dL (ref 0.4–1.2)
GFR: 85.8 mL/min (ref >60.00)
Glucose, Bld: 89 mg/dL (ref 70–99)
Potassium: 3.9 mEq/L (ref 3.5–5.1)
Sodium: 140 mEq/L (ref 135–145)

## 2014-02-06 LAB — HEPATIC FUNCTION PANEL
ALT: 17 U/L (ref 0–35)
AST: 28 U/L (ref 0–37)
Albumin: 4.2 g/dL (ref 3.5–5.2)
Alkaline Phosphatase: 103 U/L (ref 39–117)
BILIRUBIN DIRECT: 0.1 mg/dL (ref 0.0–0.3)
BILIRUBIN TOTAL: 0.8 mg/dL (ref 0.2–1.2)
Total Protein: 7.6 g/dL (ref 6.0–8.3)

## 2014-02-06 LAB — CBC WITH DIFFERENTIAL/PLATELET
Basophils Absolute: 0 10*3/uL (ref 0.0–0.1)
Basophils Relative: 0.4 % (ref 0.0–3.0)
EOS ABS: 0.2 10*3/uL (ref 0.0–0.7)
Eosinophils Relative: 2.5 % (ref 0.0–5.0)
HCT: 40.6 % (ref 36.0–46.0)
Hemoglobin: 13.4 g/dL (ref 12.0–15.0)
Lymphocytes Relative: 15.1 % (ref 12.0–46.0)
Lymphs Abs: 1.2 10*3/uL (ref 0.7–4.0)
MCHC: 33 g/dL (ref 30.0–36.0)
MCV: 89.7 fl (ref 78.0–100.0)
MONO ABS: 0.5 10*3/uL (ref 0.1–1.0)
Monocytes Relative: 5.9 % (ref 3.0–12.0)
NEUTROS PCT: 76.1 % (ref 43.0–77.0)
Neutro Abs: 6.3 10*3/uL (ref 1.4–7.7)
PLATELETS: 232 10*3/uL (ref 150.0–400.0)
RBC: 4.52 Mil/uL (ref 3.87–5.11)
RDW: 13.4 % (ref 11.5–15.5)
WBC: 8.2 10*3/uL (ref 4.0–10.5)

## 2014-02-06 LAB — CEA: CEA: 0.8 ng/mL (ref 0.0–5.0)

## 2014-02-06 LAB — SEDIMENTATION RATE: Sed Rate: 36 mm/hr — ABNORMAL HIGH (ref 0–22)

## 2014-02-06 LAB — TSH: TSH: 1.85 u[IU]/mL (ref 0.35–4.50)

## 2014-02-06 LAB — VITAMIN D 25 HYDROXY (VIT D DEFICIENCY, FRACTURES): VITD: 66.04 ng/mL (ref 30.00–100.00)

## 2014-02-06 NOTE — Patient Instructions (Signed)
Today we updated your med list in our EPIC system...    Continue your current medications the same...  Today we did your follow up CXR & FASTING blood work... We will arrange for a VENOUS DOPPLER test to r/o blood clot in your right leg...    We will contact you w/ the results when available...   In the meanwhile> Eliminate salt/ sodium from your diet, elevate your legs, wear support hose if able...  We discussed rechecking your Bone Density after 1st of the year-    Call us at that time to arrange this test when your are ready...    We will consider an endocrinology referral to review other options for your bones after we see the results...    Remember to use the calcium supplement (eg. VIACTIV), Women's multivit 7 extra vit D ~2000u per day...  Call for any questions.Marland KitchenMarland Kitchen

## 2014-02-06 NOTE — Progress Notes (Signed)
Unilateral lower venous duplex performed  

## 2014-02-07 ENCOUNTER — Encounter: Payer: Self-pay | Admitting: Pulmonary Disease

## 2014-02-07 NOTE — Progress Notes (Addendum)
Subjective:    Patient ID: Brenda Hammond, female    DOB: [birth year], 80 y.o.   MRN: [id number]  HPI 80 y/o WF here for a follow up visit... I saw her 7/08 for an office visit due to rash (she is very allergic to sulfa & was taking Lasix20 for edema)... she refused CPX, routine CXR, EKG, labs; and refused screening colonoscopy, Flu shots, Pneumovax, etc...      Presented to ER 07/13/09 w/ rectal bleeding & colonoscopy by Demetra Shiner revealed a sigmoid mass... CT scan was otherw neg (3cm tumor, neg nodes, neg liver) & she had a low anterior resection w/ anastomosis by DrHIngram... path showed adenocarcinoma ?T2-3 N0- neg margins, 11/11 neg nodes, but tumor invasion thru muscularis into pericolonic adipose tissue...  post-op she had AFib & Cards was consulted- she was treated w/ Amio, which has since been stopped in follow up... she had post hosp f/u w/ DrIngram 07/30/09- doing well...  she saw DrSherrill for Oncology 08/12/09- she was given an option for adjuvant chemotherapy vs a research protocol & she chose observation alone...   ~  March 18, 2010:  28mo ROV & due for labs w/ CEA level she wants sent to [facility]... states she is feeling well- walking daily, good appetite, eating well/ no problems... denies Abd pain, nausea, vomiting, diarrhea (x w/ chinese food), constip, blood, etc... she is not on any perscription meds just taking Calcium, MVI, Vit D, SlowFe...  ~  October 26, 2010:  9mo ROV & continues to do well> no new complaints or concerns, just some fibromyalgia related discomfort for which she takes Tylenol... She is not on any prescription meds... She had f/u colonoscopy 1/12 by DrKaplan> AVM in cecum, clean anastomosis in sigmoid region, no lesions seen (f/u planned 77yrs)... Due for f/u fasting blood work today> see below & she requests CEA= 1.2 today...  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 She had a BMD done 11/12 at [facility] w/ TScores -2.3 in Spine and -0.5 in left Hip & she started on the Alendronate-tol well so far (NOTE: subseq stopped on her own due to "gas" 7 refuses alternative rx);  She knows to walk for exercise & to help her bones...    She had a follow up eval by DrIngram 4/13> clinically stable, exam was neg, he released her from further follow up; we did f/u CEA=0.7 & copy sent for his review.    We reviewed prob list, meds, xrays and labs> see below>>  LABS 6/13:  FLP- at goals x LDL=108;  Chems- ok;  CBC- ok;  TSH=1.22;  VitD=41;  CEA=0.7  ~  December 06, 2012:  32yr Roberts reports a good yr, doing well, no new complaints or concerns, she is still c/o her aches & pains from the FM (uses OTC analgesics as needed); she is not on any prescription medications and she likes it this way... We reviewed the following medical problems during today's office visit >>     PAF> this occurred post op colon resection; converted to NSR on Amio & has maintained NSR off meds since 2011; rhythm is reg & BP= 110/72; she denies CP, palpit, SOB, edema, etc...     Ven Insuffic> she knows to elim salt, elev legs,  wear support hose as needed...     Hyperlipid> on diet alone, she has refused med rx; [place] 6/14 shows TChol 212, TG 149, HDL 61, LDL 124     GI- GERD, Hx colon cancer> she had refused routine colon etc; presented to ER 1/11 w/ rectal bleeding & eval by Demetra Shiner showed sigmoid mass- surg by Clarita Leber, seen by DrSherrill & she declined adjuvant chemoRx; CEA at Dx=5.2, lab 6/14=1.0.Marland KitchenMarland Kitchen     DJD, FM, Scoliosis> her CC = same old aches & pains c/w FM but she prefers just OTC analgesics as needed...     Osteopenia> she stopped her Alendronate70 due to gas & "it made my heart flutter" (added to long list of med intolerances); she refused to consider other meds due to cost issues etc...     Hx polio> she had  polio in the 1950's, it affected her right leg, she has long hx scoliosis; she doesn't "get it" that vaccines prevent disease & she refuses all adult vaccinations...     Anxiety> she was intol to anxiolytic rx in the past; she is very religious w/ a strong faith...     Hx anemia> Hg nadir in 2011 was 9.7; since then in the 12 range & lab 6/14 shows Hg= 12.4     Hx mult drug sensitivities> see long list below...  We reviewed prob list, meds, xrays and labs> see below for updates >>   LABS 6/14: FLP- not at goals on diet alone, she refuses med Rx; Chems- wnl; CBC- wnl w/ Hg=12.4; TSH- not done; VitD=43; CEA=1.0.Marland KitchenMarland Kitchen   Stool cards- pending=> she never collected this specimen...  ~  February 06, 2014:  45mo ROV & Rhema reports a good year but notes right leg swelling x 3wks now, worse in eve & better in AM but left leg not swollen; denies leg pain, erythema, warmth, sores, drainage, etc; denies known trauma, f/c/s, rash, or difficulty ambulating;  Exam shows 2+pitting edema on right to the thigh level, no rash, nontender, etc;  LABS w/ norm CBC, but Sed=36;  VenDopplers returned neg for DVT=> ?etiology & we decided to treat w/ DOXY x10d, warm soaks, elevate, support hose, no salt & re-assess after this Rx to determine next step (eg- CT Abd&Pelis etc) >> We reviewed the following medical problems during today's office visit >>     PAF> this occurred post op colon resection 2011; converted to NSR on Amio & has maintained NSR off meds since then- rhythm is reg & BP= 138/62; she denies CP, palpit, SOB, edema, etc...    Ven Insuffic & new right leg edema ?etiology>  SEE ABOVE    Hyperlipid> on diet alone, she has refused med rx; FLP 8/15 shows TChol 225, TG 111, HDL 69, LDL 134; she understands that this in not at goal, refuses meds, needs better diet + exercise...    GI- GERD, Hx colon cancer> she had refused routine colon etc; presented to ER 1/11 w/ rectal bleeding & eval by Demetra Shiner showed sigmoid mass- surg  by Clarita Leber, seen by DrSherrill & she declined adjuvant chemoRx; She had f/u colonoscopy 2/15 w/ cecal AVM & sigmoid polyp (bx=hyperplastic- f/u suggested 50yrs); CEA at Dx=5.2, lab 8/15= 0.8..Marland Kitchen    DJD, FM, Scoliosis> her CC = same old aches & pains c/w FM but she prefers just OTC analgesics as needed...    Osteopenia> she stopped her Alendronate due to gas & "it made my heart flutter" (added to long list of med intolerances); she  refused to consider other meds due to cost issues etc; we discussed f/u BMD in 2016 & consider endocrine consult...    Hx polio> she had polio in the 1950's, it affected her right leg, she has long hx scoliosis; she doesn't "get it" that vaccines prevent disease & she refuses all adult vaccinations...    Anxiety> she was intol to anxiolytic rx in the past; she is very religious w/ a strong faith...    Hx anemia> Hg nadir in 2011 was 9.7; since then in the 12 range & lab 8/15 shows Hg= 13.4    Hx mult drug sensitivities> see long list below... We reviewed prob list, meds, xrays and labs> see below for updates >>   CXR 8/15> she forgot to get the requested f/u chest film...  LABS 8/15:  FLP- not at goals on diet alone, she refuses med Rx;  Chems- wnl;  CBC- wnl w/ Hg=13.4;  TSH=1.85;  VitD=66;  CEA=0.8;  Sed=36...  Venous Dopplers 8/15>  Neg for DVT... ADDENDUM>> No better after Doxy Rx;  CT Abd & Pelvis 02/27/14 is NEG for any cause for the right leg swelling => we will refer to ID for their assessment          Problem List:   ATRIAL FIBRILLATION (ICD-427.31) - this occured post-op colon resection... seen by DrBensimhon for Cards and discharged on Amiodarone 400mg /d... she converted to NSR & the Amio was stopped 3/11... ~  Baseline EKG showed NSR, ?LAE, IVCD, borderline tracing... ~  CXR 1/11 showed mild cardiomeg, interstitial prominence, calcif granuloma, NAD... ~  2DEcho 1/11 showed norm LVF w/ EF= 55-65%, sl incr wall thickness= mild LVH, mild MR, mild LA dil at  66mm... ~  6/13:  Rhythm remains regular>  she denies any irregularity/ CP/ palpit/ etc... ~  6/14:  this occurred post op colon resection; converted to NSR on Amio & has maintained NSR off meds since 2011; rhythm is reg & BP= 110/72; she denies CP, palpit, SOB, edema, etc ~  8/15: she is maintaining NSR- rhythm is reg & BP= 138/62; she denies CP, palpit, SOB, edema, etc.  VENOUS INSUFFICIENCY (ICD-459.81) - she knows to restrict sodium, elevate legs, wear support hose, etc... ~  8/15: she presented w/ a 3wk hx of right leg swelling ?etiology> worse in eve & better in AM but left leg not swollen; denies leg pain, erythema, warmth, sores, drainage, etc; denies known trauma, f/c/s, rash, or difficulty ambulating;  Exam shows 2+pitting edema on right to the thigh level, no rash, nontender, etc;  LABS w/ norm CBC, but Sed=36;  VenDopplers returned neg for DVT=> ?etiology & we decided to treat w/ DOXY x10d, warm soaks, elevate, support hose, no salt & re-assess after this Rx to determine next step (eg- CT Abd&Pelis etc).   HYPERLIPIDEMIA (ICD-272.4) - on diet alone, she has refused statin Rx in the past... ~  [place] 7/08 showed TChol 220, TG 110, HDL 49, LDL 154 ~  FLP 9/11 showed TChol 228, TG 184, HDL 60, LDL 135... she desires to continue diet alone. ~  FLP 4/12 showed TChol 207, TG 114, HDL 65, LDL 120... Actually improved on her diet Rx... ~  [place] 6/13 showed TChol 196, TG 128, HDL 62, LDL 108 ~  FLP 6/14 showed TChol 212, TG 149, HDL 61, LDL 124  ~  FLP 8/15 on diet alone showed TChol 225, TG 111, HDL 69, LDL 134   Hx of GERD (ICD-530.81) - ?she reports HH & "  ulcer" found on UGI series yrs ago... she knows that she can take OTC PPI (Ompeprazole 20mg ) vs H2Blocker Rx (Zantac, Pepcid) whenever she needs it...  Hx of ADENOCARCINOMA, COLON (ICD-153.9) - Presented to ER 07/13/09 w/ rectal bleeding & colonoscopy by Demetra Shiner revealed a sigmoid mass... CT scan was otherw neg (3cm tumor, neg nodes, neg liver) &  she had a low anterior resection w/ anastomosis by DrHIngram... path showed adenocarcinoma ?T2-3 N0- neg margins, 11/11 neg nodes, but tumor invasion thru muscularis into pericolonic adipose tissue...  she saw DrSherrill for Oncology 08/12/09- she was given an option for adjuvant chemotherapy vs a research protocol & she chose observation alone...  ~  NOTE:  pre op CEA level 1/11 = 5.2 ~  f/u CEA level 3/11 = 3.5 ~  labs 9/11 showed CEA level = 1.3 ~  F/u colonoscopy 1/12 by DrKaplan revealed an AVM in cecum, sigmoid anastomosis was clean, no other lesions, repeat planned 39yrs. ~  Labs 4/12 showed CEA level = 1.2 ~  Labs 6/13 showed CEA= 0.7 ~  Labs 6/14 showed CEA= 1.0 ~  2/15:  She had f/u colonoscopy by DrKaplan> cecal AVM and 39mm polyps in sigmoid colon (bx=hyperplastic) w/ f/u colon suggested in 22yrs... ~  Labs 8/15 showed CEA= 0.8    Hx of UTI (ICD-599.0)  DEGENERATIVE JOINT DISEASE (ICD-715.90) >> on TYLENOL ARTHRITIS as needed... FIBROMYALGIA (ICD-729.1) - currently using TYLENOL Prn... symptoms were suggestive of FM & she was referred to Rheum in 1996 [facility])... he felt she had scoliosis + chr LBP w/ poss superimposed FM... he rec Skelaxin & Ambien 5mg  (even 1/2 Skelaxin too sedating, & did try the [place])... he rec counselling & phys therapy... she subseq tried Serzone & improved... she had a second opinion consult from DrTruslow in 1997... rec to try Integrative Therapies...  SCOLIOSIS (ICD-737.30) - she was evaluated by a chiropractor in the 90s w/ DDD... subseq MRI was reported to show no herniations or neuro compromise...  OSTEOPENIA >> she had BMD done 11/12 at [facility] w/ TScores -2.3 in Spine and -0.5 in left Hip & she started on the ALENDRONATE 70mg /wk- tol well so far;  She knows to walk for exercise & to help her bones... ~  Labs 6/13 showed Vit D level = 41 and rec to take OTC VitD supplement... ~  6/14:  She reports that she stopped the Alendronate on her own because  it caused gas & made her heart flutter, she says... ~  8/15:  She indicates that she wants another BMD & I told her to wait until 2016 & we will recheck then w/ referral to Endocrine if there has been deterioration in her BMD measurements...   POLIOMYELITIS, HX OF (ICD-V12.02) - she had poliomyelitis in the 1950's... it affected her right leg, and she has a long hx of scoliosis... she saw DrLove in 1996 for vertigo & LBP.  ANXIETY (ICD-300.00) - intol to anxiolytic Rx in the past... she is very religious and "I cope well"...  ANEMIA (ICD-285.9) - Hg was 12.9 on admission 1/11... nadir value in hosp was 9.7.Marland Kitchen. ~  labs 3/11 showed Hg= 11.9, MCV= 89, Fe= 39 (sat11%), B12= 478... rec> OTC Fe + VitC daily... ~  labs 9/11 showed Hg= 12.9, MCV= 91, Fe= 147 (42%sat) ~  Labs 4/12 showed Hg= 12.9, MCV= 90, still on her Iron supplement. ~  Labs 6/13 showed Hg= 12.2, MCV= 90, she has stopped her Iron supplement. ~  Labs 6/14 showed  Hg= 12.4 ~  Labs 8/15 showed Hg= 13.4  Hx of OTHER DRUG ALLERGY (ICD-995.27) - she has mult medication allergies and sensitivities w/ >10 drugs on the list. ~  intol to HRT after her hyst & BSO... ~  intol to support hose for her VI, edema. ~  Penicillin, Sulfa, Nsaids: caused rashes ~  Tranxene: single dose caused oversedation. ~  Pamelor, Flexeril, Skelaxin: caused nightmares. ~  Alendronate caused gas & "it made my heart flutter" per pt.  Hx of SURG/OTH PROC NOT CARRIED OUT BECAUSE PTS DECN (ICD-V64.2) - she had repeatedly refused attempts to sched CPX, routine CXR, EKG, lab work, screening colon, f/u GYN checks [facility] prev), mammograms, & all vaccinations... ~  1/11:  She presented to the ER w/ rectal bleeding & colonoscopy revealed sigmoid cancer, surg DrIngram, Oncology review by DrSherrill... ~  4/12:  She is requested to contact [facility] for GYN f/u, mammograms, & BMD...   Past Surgical History  Procedure Laterality Date  . Low anterior bowel resection  06/2009     with anastomosis for colon cancer  . Total abdominal hysterectomy w/ bilateral salpingoophorectomy  1980s  . Appendectomy    . Other surgical history      T & A    Outpatient Encounter Prescriptions as of 02/06/2014  Medication Sig  . acetaminophen (TYLENOL) 650 MG CR tablet Take 650 mg by mouth every 8 (eight) hours as needed for pain.  . Calcium Carbonate-Vit D-Min 600-400 MG-UNIT TABS Take 1 tablet by mouth 2 (two) times daily.    . cholecalciferol (VITAMIN D) 1000 UNITS tablet Take 1,000 Units by mouth daily.  Marland Kitchen loratadine (CLARITIN) 10 MG tablet Take 10 mg by mouth daily.  . Multiple Vitamins-Calcium (ONE-A-DAY WOMENS PO) Take 1 tablet by mouth daily.      Allergies  Allergen Reactions  . Alendronate Other (See Comments)    "twitching"  . Ampicillin     REACTION: rash and high fever  . Aspirin     REACTION: ears ringing  . Cyclobenzaprine Hcl     REACTION: nightmares  . Metaxalone     REACTION: nightmares  . Nitrofurantoin     REACTION: elevated BP and chest pain  . Nsaids     REACTION: rash  . Penicillins     REACTION: rash, high fever  . Sulfonamide Derivatives     REACTION: elevated HR and BP  . Nortriptyline Hcl Rash    Review of Systems        See HPI - all other systems neg except as noted... The patient denies anorexia, fever, weight loss, weight gain, vision loss, decreased hearing, hoarseness, chest pain, syncope, dyspnea on exertion, peripheral edema, prolonged cough, headaches, hemoptysis, abdominal pain, melena, hematochezia, severe indigestion/heartburn, hematuria, incontinence, muscle weakness, suspicious skin lesions, transient blindness, difficulty walking, depression, unusual weight change, abnormal bleeding, enlarged lymph nodes, and angioedema.     Objective:   Physical Exam      WD, Thin, 80 y/o WF in NAD... GENERAL:  Alert & oriented; pleasant & cooperative. HEENT:  [place]/AT, EOM-wnl, PERRLA, EACs-clear, TMs-wnl, NOSE-clear, THROAT-clear &  wnl. NECK:  Supple w/ fairROM; no JVD; normal carotid impulses w/o bruits; no thyromegaly or nodules palpated; no lymphadenopathy. CHEST:  Clear to P & A; without wheezes/ rales/ or rhonchi. HEART:  Regular Rhythm; without murmurs/ rubs/ or gallops. ABDOMEN:  Soft & nontender, s/p surg; normal bowel sounds; no organomegaly or masses detected. EXT: without deformities or arthritic changes; no varicose veins/ venous  insuffic/ or edema. NEURO:  CN's intact; motor testing normal; sensory testing normal; gait normal & balance OK. DERM:  No lesions noted; no rash etc...  RADIOLOGY DATA:  Reviewed in the EPIC EMR & discussed w/ the patient...  LABORATORY DATA:  Reviewed in the EPIC EMR & discussed w/ the patient...   Assessment & Plan:    RIGHT LEG SWELLING >> ?etiology (no trauma, no immobility, no pain), NEG VenDoppler, normal CBC but Sed=36; we decided to treat w/ DOXY x10d, warm soaks, elev, support hose, no salt & re-assess in 2wks to determine further eval...   Hx PAF>  She remains regular w/o CP, palpit/ SOB/ edema...  Hyperlipid>  FLP not at goals on her diet management w/o medication which is the way she likes it...  GERD>  She knows to use Prilosec vs Zantac as needed for reflux symptoms...  Hx colon cancer>  She had f/u colonoscopy 2/15 by DrKaplan, no obvious recurrence;  CEA today = 0.8  DJD/ Scoliosis/ FM>  She uses rest, heat, tylenol, etc...  Osteopenia>  TScore was -2.3 in Spine & L1 individually was -2.8; stated FOSAMAX 70mg /d along w/ Calcium, MVI, Vit D but she has since stopped the Alendronate & refuses med rx...   Other medical issues as noted...   Patient's Medications  New Prescriptions   No medications on file  Previous Medications   ACETAMINOPHEN (TYLENOL) 650 MG CR TABLET    Take 650 mg by mouth every 8 (eight) hours as needed for pain.   CALCIUM CARBONATE-VIT D-MIN 600-400 MG-UNIT TABS    Take 1 tablet by mouth 2 (two) times daily.     CHOLECALCIFEROL  (VITAMIN D) 1000 UNITS TABLET    Take 1,000 Units by mouth daily.   LORATADINE (CLARITIN) 10 MG TABLET    Take 10 mg by mouth daily.   MULTIPLE VITAMINS-CALCIUM (ONE-A-DAY WOMENS PO)    Take 1 tablet by mouth daily.    Modified Medications   No medications on file  Discontinued Medications   No medications on file

## 2014-02-08 ENCOUNTER — Telehealth: Payer: Self-pay | Admitting: Pulmonary Disease

## 2014-02-08 MED ORDER — DOXYCYCLINE HYCLATE 100 MG PO TABS: 100.0000 mg | ORAL_TABLET | Freq: Two times a day (BID) | ORAL | Status: DC

## 2014-02-08 NOTE — Telephone Encounter (Signed)
Please notify patient> Ven Doppler was reported NEG... FLP not at goals on diet alone> rec better low chol, low fat diet or consider low dose statin therapy... Chems, CBC, Thyroid, VitD> all WNL... CEA remains normal and speaks against any sign of tumor recurrence... Sed rate elev at 36 indicative of some inflammation> Rec trial of warm soaks, elevation, no salt & DOXT100mg Bid x10d, call w/ response --  I spoke with patient about results and she verbalized understanding and had no questions RX sent in

## 2014-02-18 ENCOUNTER — Telehealth: Payer: Self-pay | Admitting: Internal Medicine

## 2014-02-18 DIAGNOSIS — L03115 Cellulitis of right lower limb: Secondary | ICD-10-CM

## 2014-02-18 NOTE — Telephone Encounter (Signed)
Pt returned call & asks to be reached at work, [phone].  Brenda Hammond

## 2014-02-18 NOTE — Telephone Encounter (Signed)
Per SN---  Refer to Infectious Disease  at [facility] asap for their eval and consult.  thanks

## 2014-02-18 NOTE — Telephone Encounter (Signed)
Spoke with the pt and notified of recs per SN Pt verbalized understanding and nothing further needed

## 2014-02-18 NOTE — Telephone Encounter (Signed)
Order placed for ID Dx Cellulitis  lmtcb x1 for pt

## 2014-02-18 NOTE — Telephone Encounter (Signed)
Pt calling stating that her cellulitis in her Right leg is not improving.  Given Doxycycline 100mg  02/06/14--pt notes very little improvement with this abx.  Pt is aware that she is allergic to a lot of antibiotics but is wanting to know if there is something in addition to the Doxy that can added.  Pt states that her leg is still swollen from knee to ankle and the redness is in ankle to mid-calf.   [facility] [address]  Allergies  Allergen Reactions  . Alendronate Other (See Comments)    "twitching"  . Ampicillin     REACTION: rash and high fever  . Aspirin     REACTION: ears ringing  . Cyclobenzaprine Hcl     REACTION: nightmares  . Metaxalone     REACTION: nightmares  . Nitrofurantoin     REACTION: elevated BP and chest pain  . Nsaids     REACTION: rash  . Penicillins     REACTION: rash, high fever  . Sulfonamide Derivatives     REACTION: elevated HR and BP  . Nortriptyline Hcl Rash   Please advise Dr Lenna Gilford. Thanks.    Medication List       This list is accurate as of: 02/18/14  9:02 AM.  Always use your most recent med list.               acetaminophen 650 MG CR tablet  Commonly known as:  TYLENOL  Take 650 mg by mouth every 8 (eight) hours as needed for pain.     Calcium Carbonate-Vit D-Min 600-400 MG-UNIT Tabs  Take 1 tablet by mouth 2 (two) times daily.     cholecalciferol 1000 UNITS tablet  Commonly known as:  VITAMIN D  Take 1,000 Units by mouth daily.     doxycycline 100 MG tablet  Commonly known as:  VIBRA-TABS  Take 1 tablet (100 mg total) by mouth 2 (two) times daily.     loratadine 10 MG tablet  Commonly known as:  CLARITIN  Take 10 mg by mouth daily.     ONE-A-DAY WOMENS PO  Take 1 tablet by mouth daily.

## 2014-02-19 ENCOUNTER — Other Ambulatory Visit: Payer: Self-pay | Admitting: Pulmonary Disease

## 2014-02-19 DIAGNOSIS — M7989 Other specified soft tissue disorders: Secondary | ICD-10-CM

## 2014-02-19 DIAGNOSIS — C189 Malignant neoplasm of colon, unspecified: Secondary | ICD-10-CM

## 2014-02-21 ENCOUNTER — Encounter: Payer: Self-pay | Admitting: Internal Medicine

## 2014-02-21 ENCOUNTER — Ambulatory Visit (INDEPENDENT_AMBULATORY_CARE_PROVIDER_SITE_OTHER): Payer: Medicare Other | Admitting: Internal Medicine

## 2014-02-21 VITALS — BP 174/70 | HR 75 | Temp 97.2°F | Wt 116.0 lb

## 2014-02-21 DIAGNOSIS — M7989 Other specified soft tissue disorders: Secondary | ICD-10-CM

## 2014-02-22 ENCOUNTER — Other Ambulatory Visit: Payer: Self-pay

## 2014-02-27 ENCOUNTER — Ambulatory Visit (INDEPENDENT_AMBULATORY_CARE_PROVIDER_SITE_OTHER)
Admission: RE | Admit: 2014-02-27 | Discharge: 2014-02-27 | Disposition: A | Discharge status: Home / Self Care | Payer: Medicare Other | Source: Ambulatory Visit | Attending: Pulmonary Disease | Admitting: Pulmonary Disease

## 2014-02-27 DIAGNOSIS — C189 Malignant neoplasm of colon, unspecified: Secondary | ICD-10-CM

## 2014-02-27 DIAGNOSIS — M7989 Other specified soft tissue disorders: Secondary | ICD-10-CM

## 2014-02-27 MED ORDER — IOHEXOL 300 MG/ML  SOLN
100.0000 mL | Freq: Once | INTRAMUSCULAR | Status: AC | PRN
  Administered 2014-02-27: 100 mL via INTRAVENOUS

## 2014-02-28 ENCOUNTER — Ambulatory Visit (INDEPENDENT_AMBULATORY_CARE_PROVIDER_SITE_OTHER): Payer: Medicare Other | Admitting: Internal Medicine

## 2014-02-28 ENCOUNTER — Encounter: Payer: Self-pay | Admitting: Internal Medicine

## 2014-02-28 VITALS — BP 165/77 | HR 68 | Temp 97.3°F | Wt 118.0 lb

## 2014-02-28 DIAGNOSIS — R609 Edema, unspecified: Secondary | ICD-10-CM

## 2014-02-28 DIAGNOSIS — R600 Localized edema: Secondary | ICD-10-CM

## 2014-03-01 ENCOUNTER — Telehealth: Payer: Self-pay | Admitting: *Deleted

## 2014-03-01 NOTE — Progress Notes (Signed)
Subjective:    Patient ID: Brenda Hammond, female    DOB: [birth year], 80 y.o.   MRN: [id number]  HPI 80yo F with hx of colon ca, scoliosis, fibromyalgia who is seen in follow up for right foot/ankle swelling affecting both lower extremities. She has had lower extremity doppler of right leg which did not show any DVT or venous insufficiency. She denies pain associated with the swelling but does feel like she has pain from her left hip which is long standing. She was given a course of doxycycline by her primary care doctor concerning for cellulitis since her skin does have slight erythema but no induration. She states that since finishing the antibiotics that she has not had any improvement in symptoms of swelling. At our last appointment, it was recommended that she should do a trial of compression stalkings to see if it would alleviate her symptoms but she states that the compression socks often aggravate her fibromylgia.   When she wakes in the morning, she states that her feet are at their baseline, but within the hour is when she notices swelling reaccumulation.  Current Outpatient Prescriptions on File Prior to Visit  Medication Sig Dispense Refill  . acetaminophen (TYLENOL) 650 MG CR tablet Take 650 mg by mouth every 8 (eight) hours as needed for pain.      . Calcium Carbonate-Vit D-Min 600-400 MG-UNIT TABS Take 1 tablet by mouth 2 (two) times daily.        . cholecalciferol (VITAMIN D) 1000 UNITS tablet Take 1,000 Units by mouth daily.      Marland Kitchen doxycycline (VIBRA-TABS) 100 MG tablet Take 1 tablet (100 mg total) by mouth 2 (two) times daily.  20 tablet  0  . loratadine (CLARITIN) 10 MG tablet Take 10 mg by mouth daily.      . Multiple Vitamins-Calcium (ONE-A-DAY WOMENS PO) Take 1 tablet by mouth daily.         No current facility-administered medications on file prior to visit.   Active Ambulatory Problems    Diagnosis Date Noted  . ADENOCARCINOMA, COLON 09/08/2009  . HYPERLIPIDEMIA  10/30/2008  . ANEMIA 09/08/2009  . ANXIETY 10/30/2008  . ATRIAL FIBRILLATION 08/21/2009  . VENOUS INSUFFICIENCY 09/08/2009  . GERD 09/08/2009  . UTI 09/08/2009  . DEGENERATIVE JOINT DISEASE 09/08/2009  . FIBROMYALGIA 10/30/2008  . SCOLIOSIS 10/30/2008  . OTHER DRUG ALLERGY 09/08/2009  . POLIOMYELITIS, HX OF 10/30/2008  . LBP (low back pain) 12/06/2012  . Osteopenia 12/06/2012  . Unspecified vitamin D deficiency 12/06/2012  . Right leg swelling 02/06/2014   Resolved Ambulatory Problems    Diagnosis Date Noted  . No Resolved Ambulatory Problems   Past Medical History  Diagnosis Date  . Venous insufficiency   . Hyperlipemia   . GERD (gastroesophageal reflux disease)   . Colon adenocarcinoma   . History of UTI   . DJD (degenerative joint disease)   . Fibromyalgia   . Scoliosis   . History of poliomyelitis   . Anxiety   . Anemia   . Surgical or other procedure not carried out because of patient's decision       Review of Systems + arthralgia, myalgias - states her normal aches/pains from fibromyalgia. Loose stools now resolved from taking oral contrast from pelvis/abd CT yesterday    Objective:   Physical Exam BP 165/77  Pulse 68  Temp(Src) 97.3 F (36.3 C) (Oral)  Wt 118 lb (53.524 kg) Physical Exam  Constitutional:  oriented to person, place,  and time. appears well-developed and well-nourished. No distress.  Ext: +1 pitting edema to dorsum of feet and trace to ankles bilaterally. Skin: Skin is warm and dry. No rash noted. No erythema.      Assessment & Plan:  Appears to look more like venous insufficiency rather than cellulitis. Presently, she does not appear to need further antibiotics. Again recommended that she try as much as she can to do a compression sock or stalking to see if it helps her symptoms. Will get left leg venous assess for venous insufficiency

## 2014-03-01 NOTE — Telephone Encounter (Signed)
Per Dr Baxter Flattery changed the patient procedure from a ABI to L Lower Ext Doppler. Was able to keep 03/07/14 but changed the time to 1200 pm. Had to leave a message for the patient to call back if she had any issues with the new time.

## 2014-03-01 NOTE — Progress Notes (Signed)
Rfv: cellulits Subjective:    Patient ID: Brenda Hammond, female    DOB: 12-25-1932, 80 y.o.   MRN: [id number]  HPI  80yo F with fibromyalgia, scoliosis, hx of colon cancer who reports having recently been treated for cellulitis of lower extremity without improvement. She states that both feet are affected with swelling. She recently had venous doppler exam that rule out dvt in RLE. She notices occassional pain arising from left hip that travels down to her feet. She reports the swelling is often better in the morning and worse in the evening.  Allergies  Allergen Reactions  . Alendronate Other (See Comments)    "twitching"  . Ampicillin     REACTION: rash and high fever  . Aspirin     REACTION: ears ringing  . Cyclobenzaprine Hcl     REACTION: nightmares  . Metaxalone     REACTION: nightmares  . Nitrofurantoin     REACTION: elevated BP and chest pain  . Nsaids     REACTION: rash  . Penicillins     REACTION: rash, high fever  . Sulfonamide Derivatives     REACTION: elevated HR and BP  . Nortriptyline Hcl Rash    Current Outpatient Prescriptions on File Prior to Visit  Medication Sig Dispense Refill  . acetaminophen (TYLENOL) 650 MG CR tablet Take 650 mg by mouth every 8 (eight) hours as needed for pain.      . Calcium Carbonate-Vit D-Min 600-400 MG-UNIT TABS Take 1 tablet by mouth 2 (two) times daily.        . cholecalciferol (VITAMIN D) 1000 UNITS tablet Take 1,000 Units by mouth daily.      Marland Kitchen loratadine (CLARITIN) 10 MG tablet Take 10 mg by mouth daily.      . Multiple Vitamins-Calcium (ONE-A-DAY WOMENS PO) Take 1 tablet by mouth daily.        Marland Kitchen doxycycline (VIBRA-TABS) 100 MG tablet Take 1 tablet (100 mg total) by mouth 2 (two) times daily.  20 tablet  0   No current facility-administered medications on file prior to visit.   Active Ambulatory Problems    Diagnosis Date Noted  . ADENOCARCINOMA, COLON 09/08/2009  . HYPERLIPIDEMIA 10/30/2008  . ANEMIA 09/08/2009  .  ANXIETY 10/30/2008  . ATRIAL FIBRILLATION 08/21/2009  . VENOUS INSUFFICIENCY 09/08/2009  . GERD 09/08/2009  . UTI 09/08/2009  . DEGENERATIVE JOINT DISEASE 09/08/2009  . FIBROMYALGIA 10/30/2008  . SCOLIOSIS 10/30/2008  . OTHER DRUG ALLERGY 09/08/2009  . POLIOMYELITIS, HX OF 10/30/2008  . LBP (low back pain) 12/06/2012  . Osteopenia 12/06/2012  . Unspecified vitamin D deficiency 12/06/2012  . Right leg swelling 02/06/2014   Resolved Ambulatory Problems    Diagnosis Date Noted  . No Resolved Ambulatory Problems   Past Medical History  Diagnosis Date  . Venous insufficiency   . Hyperlipemia   . GERD (gastroesophageal reflux disease)   . Colon adenocarcinoma   . History of UTI   . DJD (degenerative joint disease)   . Fibromyalgia   . Scoliosis   . History of poliomyelitis   . Anxiety   . Anemia   . Surgical or other procedure not carried out because of patient's decision    History  Substance Use Topics  . Smoking status: Never Smoker   . Smokeless tobacco: Never Used  . Alcohol Use: No  family history includes Cancer in her mother; Stroke in her father. There is no history of Colon cancer.  Review of Systems +arthralgia, myalgias  from fibromyalgia. No f/chills/nightsweats    Objective:   Physical Exam BP 174/70  Pulse 75  Temp(Src) 97.2 F (36.2 C) (Oral)  Wt 116 lb (52.617 kg) gen = pleasant well dressed elderly female in NAD Ext: +1 edema appears L>R mostly dorsum of feet extending up to medial/lateral malleolus. Non erythamatous, not tender, no blanching.     Assessment & Plan:  Bilateral feet swelling recently treated for cellulitis = will await for results of upcoming pelvic CT to see if that would explain swelling to lower extremity. Would think this appears more consistent with venous insufficiency. Recommend to do compression socks or stalkings if she is going to be on her feet for extended period of time to see if alleviates symptoms. No antibiotics at  this time.

## 2014-03-05 ENCOUNTER — Other Ambulatory Visit (HOSPITAL_COMMUNITY): Payer: Self-pay | Admitting: *Deleted

## 2014-03-05 DIAGNOSIS — R609 Edema, unspecified: Secondary | ICD-10-CM

## 2014-03-07 ENCOUNTER — Ambulatory Visit (HOSPITAL_COMMUNITY): Payer: Medicare Other | Attending: Cardiovascular Disease | Admitting: Cardiology

## 2014-03-07 ENCOUNTER — Encounter (HOSPITAL_COMMUNITY): Payer: Self-pay

## 2014-03-07 DIAGNOSIS — R609 Edema, unspecified: Secondary | ICD-10-CM

## 2014-03-07 DIAGNOSIS — R600 Localized edema: Secondary | ICD-10-CM

## 2014-03-07 DIAGNOSIS — M79609 Pain in unspecified limb: Secondary | ICD-10-CM | POA: Diagnosis not present

## 2014-03-07 NOTE — Progress Notes (Signed)
Lt LE Venous duplex performed 

## 2014-04-03 ENCOUNTER — Telehealth: Payer: Self-pay | Admitting: *Deleted

## 2014-04-03 NOTE — Telephone Encounter (Signed)
Patient called to let Dr Baxter Flattery know that she is still having swelling in her legs and feet. She advised she has no pain and the swelling is a little better but still there. She is wearing compression stockings when she works. She works for 6 hours daily 12-6 and wants to know if she should continue to wear them since they are not giving her the relief she expected. I asked her if she had ever worn them all day even at home and how they look in the morning. She advised they look fine first thing but quickly start to swell once she gets up and no she has not tried to wear them all day. Advised her will give the doctor a message and give her a call back once I hear from her.

## 2014-04-05 NOTE — Telephone Encounter (Signed)
i think compression stalking is ok to use till she sees pcp

## 2015-05-29 DIAGNOSIS — H2513 Age-related nuclear cataract, bilateral: Secondary | ICD-10-CM | POA: Diagnosis not present

## 2015-11-17 DIAGNOSIS — H2513 Age-related nuclear cataract, bilateral: Secondary | ICD-10-CM | POA: Diagnosis not present

## 2015-11-26 ENCOUNTER — Encounter: Payer: Self-pay | Admitting: Pulmonary Disease

## 2015-11-26 ENCOUNTER — Other Ambulatory Visit (INDEPENDENT_AMBULATORY_CARE_PROVIDER_SITE_OTHER): Payer: Medicare Other

## 2015-11-26 ENCOUNTER — Ambulatory Visit: Payer: Medicare Other | Admitting: Pulmonary Disease

## 2015-11-26 ENCOUNTER — Ambulatory Visit (INDEPENDENT_AMBULATORY_CARE_PROVIDER_SITE_OTHER)
Admission: RE | Admit: 2015-11-26 | Discharge: 2015-11-26 | Disposition: A | Discharge status: Home / Self Care | Payer: Medicare Other | Source: Ambulatory Visit | Attending: Pulmonary Disease | Admitting: Pulmonary Disease

## 2015-11-26 VITALS — BP 130/90 | HR 65 | Temp 97.5°F | Ht 61.0 in | Wt 115.8 lb

## 2015-11-26 DIAGNOSIS — E559 Vitamin D deficiency, unspecified: Secondary | ICD-10-CM

## 2015-11-26 DIAGNOSIS — F411 Generalized anxiety disorder: Secondary | ICD-10-CM

## 2015-11-26 DIAGNOSIS — C189 Malignant neoplasm of colon, unspecified: Secondary | ICD-10-CM

## 2015-11-26 DIAGNOSIS — M4120 Other idiopathic scoliosis, site unspecified: Secondary | ICD-10-CM | POA: Diagnosis not present

## 2015-11-26 DIAGNOSIS — I4891 Unspecified atrial fibrillation: Secondary | ICD-10-CM | POA: Diagnosis not present

## 2015-11-26 DIAGNOSIS — M8580 Other specified disorders of bone density and structure, unspecified site: Secondary | ICD-10-CM | POA: Diagnosis not present

## 2015-11-26 DIAGNOSIS — R609 Edema, unspecified: Secondary | ICD-10-CM

## 2015-11-26 DIAGNOSIS — M797 Fibromyalgia: Secondary | ICD-10-CM | POA: Diagnosis not present

## 2015-11-26 DIAGNOSIS — E785 Hyperlipidemia, unspecified: Secondary | ICD-10-CM | POA: Diagnosis not present

## 2015-11-26 DIAGNOSIS — K219 Gastro-esophageal reflux disease without esophagitis: Secondary | ICD-10-CM

## 2015-11-26 DIAGNOSIS — M159 Polyosteoarthritis, unspecified: Secondary | ICD-10-CM

## 2015-11-26 DIAGNOSIS — Z8612 Personal history of poliomyelitis: Secondary | ICD-10-CM

## 2015-11-26 DIAGNOSIS — I872 Venous insufficiency (chronic) (peripheral): Secondary | ICD-10-CM

## 2015-11-26 DIAGNOSIS — M150 Primary generalized (osteo)arthritis: Secondary | ICD-10-CM

## 2015-11-26 DIAGNOSIS — Z8679 Personal history of other diseases of the circulatory system: Secondary | ICD-10-CM

## 2015-11-26 LAB — LIPID PANEL
CHOLESTEROL: 198 mg/dL (ref 0–200)
HDL: 59 mg/dL (ref >39.00)
LDL Cholesterol: 112 mg/dL — ABNORMAL HIGH (ref 0–99)
NonHDL: 138.71
Total CHOL/HDL Ratio: 3
Triglycerides: 134 mg/dL (ref 0.0–149.0)
VLDL: 26.8 mg/dL (ref 0.0–40.0)

## 2015-11-26 LAB — CBC WITH DIFFERENTIAL/PLATELET
Basophils Absolute: 0 10*3/uL (ref 0.0–0.1)
Basophils Relative: 0.4 % (ref 0.0–3.0)
EOS PCT: 4 % (ref 0.0–5.0)
Eosinophils Absolute: 0.3 10*3/uL (ref 0.0–0.7)
HCT: 38.7 % (ref 36.0–46.0)
HEMOGLOBIN: 13.1 g/dL (ref 12.0–15.0)
LYMPHS PCT: 16.6 % (ref 12.0–46.0)
Lymphs Abs: 1.2 10*3/uL (ref 0.7–4.0)
MCHC: 33.7 g/dL (ref 30.0–36.0)
MCV: 86.5 fl (ref 78.0–100.0)
Monocytes Absolute: 0.5 10*3/uL (ref 0.1–1.0)
Monocytes Relative: 7.1 % (ref 3.0–12.0)
Neutro Abs: 5.3 10*3/uL (ref 1.4–7.7)
Neutrophils Relative %: 71.9 % (ref 43.0–77.0)
Platelets: 242 10*3/uL (ref 150.0–400.0)
RBC: 4.48 Mil/uL (ref 3.87–5.11)
RDW: 13.4 % (ref 11.5–15.5)
WBC: 7.3 10*3/uL (ref 4.0–10.5)

## 2015-11-26 LAB — COMPLETE METABOLIC PANEL WITH GFR
ALT: 13 U/L (ref 6–29)
AST: 29 U/L (ref 10–35)
Albumin: 3.9 g/dL (ref 3.6–5.1)
Alkaline Phosphatase: 107 U/L (ref 33–130)
BILIRUBIN TOTAL: 0.4 mg/dL (ref 0.2–1.2)
BUN: 11 mg/dL (ref 7–25)
CO2: 23 mmol/L (ref 20–31)
Calcium: 9.3 mg/dL (ref 8.6–10.4)
Chloride: 104 mmol/L (ref 98–110)
Creat: 0.79 mg/dL (ref 0.60–0.88)
GFR, EST NON AFRICAN AMERICAN: 71 mL/min (ref >=60)
GFR, Est African American: 82 mL/min (ref >=60)
GLUCOSE: 83 mg/dL (ref 65–99)
Potassium: 4 mmol/L (ref 3.5–5.3)
Sodium: 142 mmol/L (ref 135–146)
TOTAL PROTEIN: 6.8 g/dL (ref 6.1–8.1)

## 2015-11-26 LAB — TSH: TSH: 1.55 u[IU]/mL (ref 0.35–4.50)

## 2015-11-26 LAB — VITAMIN D 25 HYDROXY (VIT D DEFICIENCY, FRACTURES): VITD: 45.77 ng/mL (ref 30.00–100.00)

## 2015-11-26 NOTE — Progress Notes (Signed)
Subjective:    Patient ID: Brenda Hammond, female    DOB: 11-Jul-1932, 81 y.o.   MRN: [id number]  HPI 82 y/o WF here for a follow up visit... I saw her 7/08 for an office visit due to rash (she is very allergic to sulfa & was taking Lasix20 for edema)... she refused CPX, routine CXR, EKG, labs; and refused screening colonoscopy, Flu shots, Pneumovax, etc...   Presented to ER 07/13/09 w/ rectal bleeding & colonoscopy by Brenda Hammond revealed a sigmoid mass... CT scan was otherw neg (3cm tumor, neg nodes, neg liver) & she had a low anterior resection w/ anastomosis by Brenda Hammond... path showed adenocarcinoma ?T2-3 N0- neg margins, 11/11 neg nodes, but tumor invasion thru muscularis into pericolonic adipose tissue...  post-op she had AFib & Cards was consulted- she was treated w/ Amio, which has since been stopped in follow up... she had post hosp f/u w/ Brenda Hammond 07/30/09- doing well...  she saw Brenda Hammond for Oncology 08/12/09- she was given an option for adjuvant chemotherapy vs a research protocol & she chose observation alone...   ~  [id number]:  f/u colonoscopy 1/12 by Brenda Hammond> AVM in cecum, clean anastomosis in sigmoid region, no lesions seen (f/u planned 108yrs)... CEA= 1.2 today...  She had a BMD done 11/12 at Bren[facility] w/ Brenda Hammond -2.3 in Spine and -0.5 in left Hip & she started on the Alendronate-tol well so far (NOTE: subseq stopped on her own due to "gas" & refuses alternative rx) ~  [id number]:  72mo ROV & Brenda Hammond has had a good yr overall- she works part time at her [employer] shop, still c/o aches & pains from FM, uses Tylenol Arthritis & also takes Fosamax, calcium, MVI...  LABS 6/13:  FLP- at goals x LDL=108;  Chems- ok;  CBC- ok;  TSH=1.22;  VitD=41;  CEA=0.7  ~  Brenda Hammond:  89yr Brenda Hammond reports a good yr, doing well, no new complaints or concerns, she is still c/o her aches & pains from the FM (uses OTC analgesics as needed); she is not on any prescription medications and she likes  it this way... We reviewed the following medical problems during today's office visit >>     PAF> this occurred post op colon resection; converted to NSR on Amio & has maintained NSR off meds since 2011; rhythm is reg & BP= 110/72; she denies CP, palpit, SOB, edema, etc...     Ven Insuffic> she knows to elim salt, elev legs, wear support hose as needed...     Hyperlipid> on diet alone, she has refused med rx; Brenda Hammond 6/14 shows TChol 212, TG 149, HDL 61, LDL 124     GI- GERD, Hx colon cancer> she had refused routine colon etc; presented to ER 1/11 w/ rectal bleeding & eval by Brenda Hammond showed sigmoid mass- surg by Brenda Hammond, seen by Brenda Hammond & she declined adjuvant chemoRx; CEA at Dx=5.2, lab 6/14=1.0.Marland KitchenMarland Kitchen     DJD, FM, Scoliosis> her CC = same old aches & pains c/w FM but she prefers just OTC analgesics as needed...     Osteopenia> she stopped her Alendronate70 due to gas & "it made my heart flutter" (added to long list of med intolerances); she refused to consider other meds due to cost issues etc...     Hx polio> she had polio in the 1950's, it affected her right leg, she has long hx scoliosis; she doesn't "get it" that vaccines prevent disease & she refuses all adult vaccinations.Marland KitchenMarland Kitchen  Anxiety> she was intol to anxiolytic rx in the past; she is very religious w/ a strong faith...     Hx anemia> Hg nadir in 2011 was 9.7; since then in the 12 range & lab 6/14 shows Hg= 12.4     Hx mult drug sensitivities> see long list below...  We reviewed prob list, meds, xrays and labs> see below for updates >>   LABS 6/14: FLP- not at goals on diet alone, she refuses med Rx; Chems- wnl; CBC- wnl w/ Hg=12.4; TSH- not done; VitD=43; CEA=1.0.Marland KitchenMarland Kitchen   Stool cards- pending=> she never collected this specimen...  ~  Brenda Hammond:  29mo ROV & Brenda Hammond reports a good year but notes right leg swelling x 3wks now, worse in eve & better in AM but left leg not swollen; denies leg pain, erythema, warmth, sores, drainage, etc;  denies known trauma, f/c/s, rash, or difficulty ambulating;  Exam shows 2+pitting edema on right to the thigh level, no rash, nontender, etc;  LABS w/ norm CBC, but Sed=36;  VenDopplers returned neg for DVT=> ?etiology & we decided to treat w/ DOXY x10d, warm soaks, elevate, support hose, no salt & re-assess after this Rx to determine next step (eg- CT Abd&Pelis etc) >> We reviewed the following medical problems during today's office visit >>     PAF> this occurred post op colon resection 2011; converted to NSR on Amio & has maintained NSR off meds since then- rhythm is reg & BP= 138/62; she denies CP, palpit, SOB, edema, etc...    Ven Insuffic & new right leg edema ?etiology>  SEE ABOVE    Hyperlipid> on diet alone, she has refused med rx; FLP 8/15 shows TChol 225, TG 111, HDL 69, LDL 134; she understands that this in not at goal, refuses meds, needs better diet + exercise...    GI- GERD, Hx colon cancer> she had refused routine colon etc; presented to ER 1/11 w/ rectal bleeding & eval by Brenda Hammond showed sigmoid mass- surg by Brenda Hammond, seen by Brenda Hammond & she declined adjuvant chemoRx; She had f/u colonoscopy 2/15 w/ cecal AVM & sigmoid polyp (bx=hyperplastic- f/u suggested 23yrs); CEA at Dx=5.2, lab 8/15= 0.8..Marland Kitchen    DJD, FM, Scoliosis> her CC = same old aches & pains c/w FM but she prefers just OTC analgesics as needed...    Osteopenia> she stopped her Alendronate due to gas & "it made my heart flutter" (added to long list of med intolerances); she refused to consider other meds due to cost issues etc; we discussed f/u BMD in 2016 & consider endocrine consult...    Hx polio> she had polio in the 1950's, it affected her right leg, she has long hx scoliosis; she doesn't "get it" that vaccines prevent disease & she refuses all adult vaccinations...    Anxiety> she was intol to anxiolytic rx in the past; she is very religious w/ a strong faith...    Hx anemia> Hg nadir in 2011 was 9.7; since then in the 12  range & lab 8/15 shows Hg= 13.4    Hx mult drug sensitivities> see long list below... We reviewed prob list, meds, xrays and labs> see below for updates >>   CXR 8/15> she forgot to get the requested f/u chest film...  LABS 8/15:  FLP- not at goals on diet alone, she refuses med Rx;  Chems- wnl;  CBC- wnl w/ Hg=13.4;  TSH=1.85;  VitD=66;  CEA=0.8;  Sed=36...  Venous Dopplers 8/15>  Neg for DVT.Marland KitchenMarland Kitchen  ADDENDUM>> No better after Doxy Rx;  CT Abd & Pelvis 02/27/14 is NEG for any cause for the right leg swelling => we will refer to ID for their assessment  ~  Nov 26, 2015:  69mo ROV & medical follow up visit>               Problem List:   ATRIAL FIBRILLATION (ICD-427.31) - this occured post-op colon resection... seen by DrBensimhon for Cards and discharged on Amiodarone 400mg /d... she converted to NSR & the Amio was stopped 3/11... ~  Baseline EKG showed NSR, ?LAE, IVCD, borderline tracing... ~  CXR 1/11 showed mild cardiomeg, interstitial prominence, calcif granuloma, NAD... ~  2DEcho 1/11 showed norm LVF w/ EF= 55-65%, sl incr wall thickness= mild LVH, mild MR, mild LA dil at 28mm... ~  6/13:  Rhythm remains regular>  she denies any irregularity/ CP/ palpit/ etc... ~  6/14:  this occurred post op colon resection; converted to NSR on Amio & has maintained NSR off meds since 2011; rhythm is reg & BP= 110/72; she denies CP, palpit, SOB, edema, etc ~  8/15: she is maintaining NSR- rhythm is reg & BP= 138/62; she denies CP, palpit, SOB, edema, etc.  VENOUS INSUFFICIENCY (ICD-459.81) - she knows to restrict sodium, elevate legs, wear support hose, etc... ~  8/15: she presented w/ a 3wk hx of right leg swelling ?etiology> worse in eve & better in AM but left leg not swollen; denies leg pain, erythema, warmth, sores, drainage, etc; denies known trauma, f/c/s, rash, or difficulty ambulating;  Exam shows 2+pitting edema on right to the thigh level, no rash, nontender, etc;  LABS w/ norm CBC, but Sed=36;   VenDopplers returned neg for DVT=> ?etiology & we decided to treat w/ DOXY x10d, warm soaks, elevate, support hose, no salt & re-assess after this Rx to determine next step (eg- CT Abd&Pelis etc).   HYPERLIPIDEMIA (ICD-272.4) - on diet alone, she has refused statin Rx in the past... ~  [place] 7/08 showed TChol 220, TG 110, HDL 49, LDL 154 ~  FLP 9/11 showed TChol 228, TG 184, HDL 60, LDL 135... she desires to continue diet alone. ~  FLP 4/12 showed TChol 207, TG 114, HDL 65, LDL 120... Actually improved on her diet Rx... ~  [place] 6/13 showed TChol 196, TG 128, HDL 62, LDL 108 ~  FLP 6/14 showed TChol 212, TG 149, HDL 61, LDL 124  ~  FLP 8/15 on diet alone showed TChol 225, TG 111, HDL 69, LDL 134   Hx of GERD (ICD-530.81) - ?she reports HH & "ulcer" found on UGI series yrs ago... she knows that she can take OTC PPI (Ompeprazole 20mg ) vs H2Blocker Rx (Zantac, Pepcid) whenever she needs it...  Hx of ADENOCARCINOMA, COLON (ICD-153.9) - Presented to ER 07/13/09 w/ rectal bleeding & colonoscopy by Brenda Hammond revealed a sigmoid mass... CT scan was otherw neg (3cm tumor, neg nodes, neg liver) & she had a low anterior resection w/ anastomosis by Brenda Hammond... path showed adenocarcinoma ?T2-3 N0- neg margins, 11/11 neg nodes, but tumor invasion thru muscularis into pericolonic adipose tissue...  she saw Brenda Hammond for Oncology 08/12/09- she was given an option for adjuvant chemotherapy vs a research protocol & she chose observation alone...  ~  NOTE:  pre op CEA level 1/11 = 5.2 ~  f/u CEA level 3/11 = 3.5 ~  labs 9/11 showed CEA level = 1.3 ~  F/u colonoscopy 1/12 by Brenda Hammond revealed an AVM in cecum, sigmoid  anastomosis was clean, no other lesions, repeat planned 42yrs. ~  Labs 4/12 showed CEA level = 1.2 ~  Labs 6/13 showed CEA= 0.7 ~  Labs 6/14 showed CEA= 1.0 ~  2/15:  She had f/u colonoscopy by Brenda Hammond> cecal AVM and 41mm polyps in sigmoid colon (bx=hyperplastic) w/ f/u colon suggested in 50yrs... ~  Labs 8/15  showed CEA= 0.8    Hx of UTI (ICD-599.0)  DEGENERATIVE JOINT DISEASE (ICD-715.90) >> on TYLENOL ARTHRITIS as needed... FIBROMYALGIA (ICD-729.1) - currently using TYLENOL Prn... symptoms were suggestive of FM & she was referred to Rheum in [facility] he felt she had scoliosis + chr LBP w/ poss superimposed FM... he rec Skelaxin & Ambien 5mg  (even 1/2 Skelaxin too sedating, & did try the [place])... he rec counselling & phys therapy... she subseq tried Serzone & improved... she had a second opinion consult from DrTruslow in 1997... rec to try Integrative Therapies...  SCOLIOSIS (ICD-737.30) - she was evaluated by a chiropractor in the 90s w/ DDD... subseq MRI was reported to show no herniations or neuro compromise...  OSTEOPENIA >> she had BMD done 11/12 at [facility] w/ Brenda Hammond -2.3 in Spine and -0.5 in left Hip & she started on the ALENDRONATE 70mg /wk- tol well so far;  She knows to walk for exercise & to help her bones... ~  Labs 6/13 showed Vit D level = 41 and rec to take OTC VitD supplement... ~  6/14:  She reports that she stopped the Alendronate on her own because it caused gas & made her heart flutter, she says... ~  8/15:  She indicates that she wants another BMD & I told her to wait until 2016 & we will recheck then w/ referral to Endocrine if there has been deterioration in her BMD measurements...   POLIOMYELITIS, HX OF (ICD-V12.02) - she had poliomyelitis in the 1950's... it affected her right leg, and she has a long hx of scoliosis... she saw DrLove in 1996 for vertigo & LBP.  ANXIETY (ICD-300.00) - intol to anxiolytic Rx in the past... she is very religious and "I cope well"...  ANEMIA (ICD-285.9) - Hg was 12.9 on admission 1/11... nadir value in hosp was 9.7.Marland Kitchen. ~  labs 3/11 showed Hg= 11.9, MCV= 89, Fe= 39 (sat11%), B12= 478... rec> OTC Fe + VitC daily... ~  labs 9/11 showed Hg= 12.9, MCV= 91, Fe= 147 (42%sat) ~  Labs 4/12 showed Hg= 12.9, MCV= 90, still on her Iron  supplement. ~  Labs 6/13 showed Hg= 12.2, MCV= 90, she has stopped her Iron supplement. ~  Labs 6/14 showed Hg= 12.4 ~  Labs 8/15 showed Hg= 13.4  Hx of OTHER DRUG ALLERGY (ICD-995.27) - she has mult medication allergies and sensitivities w/ >10 drugs on the list. ~  intol to HRT after her hyst & BSO... ~  intol to support hose for her VI, edema. ~  Penicillin, Sulfa, Nsaids: caused rashes ~  Tranxene: single dose caused oversedation. ~  Pamelor, Flexeril, Skelaxin: caused nightmares. ~  Alendronate caused gas & "it made my heart flutter" per pt.  Hx of SURG/OTH PROC NOT CARRIED OUT BECAUSE PTS DECN (ICD-V64.2) - she had repeatedly refused attempts to sched CPX, routine CXR, EKG, lab work, screening colon, f/u GYN checks [facility] prev), mammograms, & all vaccinations... ~  1/11:  She presented to the ER w/ rectal bleeding & colonoscopy revealed sigmoid cancer, surg Brenda Hammond, Oncology review by Brenda Hammond... ~  4/12:  She is requested to contact [facility] for  GYN f/u, mammograms, & BMD...   Past Surgical History  Procedure Laterality Date  . Low anterior bowel resection  06/2009    with anastomosis for colon cancer  . Total abdominal hysterectomy w/ bilateral salpingoophorectomy  1980s  . Appendectomy    . Other surgical history      T & A    Outpatient Encounter Prescriptions as of 11/26/2015  Medication Sig  . acetaminophen (TYLENOL) 650 MG CR tablet Take 650 mg by mouth every 8 (eight) hours as needed for pain.  . cholecalciferol (VITAMIN D) 1000 UNITS tablet Take 1,000 Units by mouth daily.  Marland Kitchen loratadine (CLARITIN) 10 MG tablet Take 10 mg by mouth daily.  . Multiple Vitamins-Calcium (ONE-A-DAY WOMENS PO) Take 1 tablet by mouth daily.    . [DISCONTINUED] Calcium Carbonate-Vit D-Min 600-400 MG-UNIT TABS Take 1 tablet by mouth 2 (two) times daily. Reported on 11/26/2015  . [DISCONTINUED] doxycycline (VIBRA-TABS) 100 MG tablet Take 1 tablet (100 mg total) by mouth 2 (two) times daily.  (Patient not taking: Reported on 11/26/2015)   No facility-administered encounter medications on file as of 11/26/2015.    Allergies  Allergen Reactions  . Alendronate Other (See Comments)    "twitching"  . Ampicillin     REACTION: rash and high fever  . Aspirin     REACTION: ears ringing  . Cyclobenzaprine Hcl     REACTION: nightmares  . Metaxalone     REACTION: nightmares  . Nitrofurantoin     REACTION: elevated BP and chest pain  . Nsaids     REACTION: rash  . Penicillins     REACTION: rash, high fever  . Sulfonamide Derivatives     REACTION: elevated HR and BP  . Nortriptyline Hcl Rash    Review of Systems        See HPI - all other systems neg except as noted... The patient denies anorexia, fever, weight loss, weight gain, vision loss, decreased hearing, hoarseness, chest pain, syncope, dyspnea on exertion, peripheral edema, prolonged cough, headaches, hemoptysis, abdominal pain, melena, hematochezia, severe indigestion/heartburn, hematuria, incontinence, muscle weakness, suspicious skin lesions, transient blindness, difficulty walking, depression, unusual weight change, abnormal bleeding, enlarged lymph nodes, and angioedema.     Objective:   Physical Exam      WD, Thin, 82 y/o WF in NAD... GENERAL:  Alert & oriented; pleasant & cooperative. HEENT:  [place]/AT, EOM-wnl, PERRLA, EACs-clear, TMs-wnl, NOSE-clear, THROAT-clear & wnl. NECK:  Supple w/ fairROM; no JVD; normal carotid impulses w/o bruits; no thyromegaly or nodules palpated; no lymphadenopathy. CHEST:  Clear to P & A; without wheezes/ rales/ or rhonchi. HEART:  Regular Rhythm; without murmurs/ rubs/ or gallops. ABDOMEN:  Soft & nontender, s/p surg; normal bowel sounds; no organomegaly or masses detected. EXT: without deformities or arthritic changes; no varicose veins/ venous insuffic/ or edema. NEURO:  CN's intact; motor testing normal; sensory testing normal; gait normal & balance OK. DERM:  No lesions noted;  no rash etc...  RADIOLOGY DATA:  Reviewed in the EPIC EMR & discussed w/ the patient...  LABORATORY DATA:  Reviewed in the EPIC EMR & discussed w/ the patient...   Assessment & Plan:    RIGHT LEG SWELLING >> ?etiology (no trauma, no immobility, no pain), NEG VenDoppler, normal CBC but Sed=36; we decided to treat w/ DOXY x10d, warm soaks, elev, support hose, no salt & re-assess in 2wks to determine further eval...   Hx PAF>  She remains regular w/o CP, palpit/ SOB/ edema...  Hyperlipid>  FLP not at goals on her diet management w/o medication which is the way she likes it...  GERD>  She knows to use Prilosec vs Zantac as needed for reflux symptoms...  Hx colon cancer>  She had f/u colonoscopy 2/15 by Brenda Hammond, no obvious recurrence;  CEA today = 0.8  DJD/ Scoliosis/ FM>  She uses rest, heat, tylenol, etc...  Osteopenia>  TScore was -2.3 in Spine & L1 individually was -2.8; stated FOSAMAX 70mg /d along w/ Calcium, MVI, Vit D but she has since stopped the Alendronate & refuses med rx...   Other medical issues as noted...   Patient's Medications  New Prescriptions   No medications on file  Previous Medications   ACETAMINOPHEN (TYLENOL) 650 MG CR TABLET    Take 650 mg by mouth every 8 (eight) hours as needed for pain.   CHOLECALCIFEROL (VITAMIN D) 1000 UNITS TABLET    Take 1,000 Units by mouth daily.   LORATADINE (CLARITIN) 10 MG TABLET    Take 10 mg by mouth daily.   MULTIPLE VITAMINS-CALCIUM (ONE-A-DAY WOMENS PO)    Take 1 tablet by mouth daily.    Modified Medications   No medications on file  Discontinued Medications   CALCIUM CARBONATE-VIT D-MIN 600-400 MG-UNIT TABS    Take 1 tablet by mouth 2 (two) times daily. Reported on 11/26/2015   DOXYCYCLINE (VIBRA-TABS) 100 MG TABLET    Take 1 tablet (100 mg total) by mouth 2 (two) times daily.

## 2015-11-26 NOTE — Patient Instructions (Signed)
Today we updated your med list in our EPIC system...    Continue your current medications the same...  Today we checked a follow up CXR to check your lungs and look at your spine; and your FASTING blood work...    We will contact you w/ the results when available...   Call me in September 2017 when you are ready for your Mammogram and Bone density test-     We will set them up at your convenience & call you w/ the results...  Call for any questions...  Let's plan a follow up visit in 32yr, sooner if needed for problems.Marland KitchenMarland Kitchen

## 2015-11-27 LAB — CEA: CEA: <0.5 ng/mL

## 2015-11-27 NOTE — Progress Notes (Signed)
Quick Note:  Called spoke with patient's spouse. Advised of lab results / recs as stated by SN. Spouse verbalized understanding and denied any questions. ______

## 2015-12-03 DIAGNOSIS — H2512 Age-related nuclear cataract, left eye: Secondary | ICD-10-CM | POA: Diagnosis not present

## 2015-12-10 DIAGNOSIS — H2512 Age-related nuclear cataract, left eye: Secondary | ICD-10-CM | POA: Diagnosis not present

## 2015-12-10 DIAGNOSIS — H2511 Age-related nuclear cataract, right eye: Secondary | ICD-10-CM | POA: Diagnosis not present

## 2015-12-24 DIAGNOSIS — H2511 Age-related nuclear cataract, right eye: Secondary | ICD-10-CM | POA: Diagnosis not present

## 2016-01-14 DIAGNOSIS — T8522XA Displacement of intraocular lens, initial encounter: Secondary | ICD-10-CM | POA: Diagnosis not present

## 2016-08-11 ENCOUNTER — Ambulatory Visit (INDEPENDENT_AMBULATORY_CARE_PROVIDER_SITE_OTHER): Payer: Medicare Other | Admitting: Family

## 2016-08-11 ENCOUNTER — Encounter: Payer: Self-pay | Admitting: Family

## 2016-08-11 VITALS — BP 154/80 | HR 70 | Temp 98.0°F | Resp 16 | Ht 61.0 in | Wt 117.0 lb

## 2016-08-11 DIAGNOSIS — C189 Malignant neoplasm of colon, unspecified: Secondary | ICD-10-CM

## 2016-08-11 DIAGNOSIS — I4891 Unspecified atrial fibrillation: Secondary | ICD-10-CM

## 2016-08-11 DIAGNOSIS — K219 Gastro-esophageal reflux disease without esophagitis: Secondary | ICD-10-CM | POA: Diagnosis not present

## 2016-08-11 DIAGNOSIS — M8589 Other specified disorders of bone density and structure, multiple sites: Secondary | ICD-10-CM

## 2016-08-11 DIAGNOSIS — E559 Vitamin D deficiency, unspecified: Secondary | ICD-10-CM

## 2016-08-11 NOTE — Progress Notes (Signed)
Subjective:    Patient ID: Brenda Hammond, female    DOB: [birth year], 83 y.o.   MRN: [id number]  Chief Complaint  Patient presents with  . Establish Care    HPI:  Brenda Hammond is a 83 y.o. female who  has a past medical history of Anemia; Anxiety; Atrial fibrillation ([place]); Colon adenocarcinoma ([place]); DJD (degenerative joint disease); Fibromyalgia; GERD (gastroesophageal reflux disease); History of poliomyelitis; History of UTI; Hyperlipemia; Other drug allergy(995.27); Scoliosis; Surgical or other procedure not carried out because of patient's decision; and Venous insufficiency. and presents today for an office visit to establish care.  1.) Osteopenia - Previous bone scan completed in 2012 and noted to have osteopenia. Maintained vitamin D 1000 units daily and is also taking a multivitamin. No fractures. Does a lot of walking and weight bearing activity.   2.) Vitamin D deficiency - Currently maitnaiend on Vitamin D and reports taking the supplement as prescribed and denies adverse side effects.  3.) GERD - Currently takes TUMS as needed and reports that her symptoms are generally well controlled with no esphoagitis.    Allergies  Allergen Reactions  . Alendronate Other (See Comments)    "twitching"  . Ampicillin     REACTION: rash and high fever  . Aspirin     REACTION: ears ringing  . Cyclobenzaprine Hcl     REACTION: nightmares  . Metaxalone     REACTION: nightmares  . Nitrofurantoin     REACTION: elevated BP and chest pain  . Nsaids     REACTION: rash  . Penicillins     REACTION: rash, high fever  . Sulfonamide Derivatives     REACTION: elevated HR and BP  . Nortriptyline Hcl Rash      Outpatient Medications Prior to Visit  Medication Sig Dispense Refill  . acetaminophen (TYLENOL) 650 MG CR tablet Take 650 mg by mouth every 8 (eight) hours as needed for pain.    . cholecalciferol (VITAMIN D) 1000 UNITS tablet Take 1,000 Units by mouth daily.    Marland Kitchen  loratadine (CLARITIN) 10 MG tablet Take 10 mg by mouth daily.    . Multiple Vitamins-Calcium (ONE-A-DAY WOMENS PO) Take 1 tablet by mouth daily.       No facility-administered medications prior to visit.      Past Medical History:  Diagnosis Date  . Anemia   . Anxiety   . Atrial fibrillation ([place])   . Colon adenocarcinoma ([place])   . DJD (degenerative joint disease)   . Fibromyalgia   . GERD (gastroesophageal reflux disease)   . History of poliomyelitis   . History of UTI   . Hyperlipemia   . Other drug allergy(995.27)   . Scoliosis   . Surgical or other procedure not carried out because of patient's decision   . Venous insufficiency       Past Surgical History:  Procedure Laterality Date  . APPENDECTOMY    . LOW ANTERIOR BOWEL RESECTION  06/2009   with anastomosis for colon cancer  . OTHER SURGICAL HISTORY     T & A  . TOTAL ABDOMINAL HYSTERECTOMY W/ BILATERAL SALPINGOOPHORECTOMY  1980s      Family History  Problem Relation Age of Onset  . Stroke Father   . Cancer Mother     brain  . Colon cancer Neg Hx       Social History   Social History  . Marital status: Married    Spouse name: Kiyra Faurot  .  Number of children: 1  . Years of education: 84   Occupational History  . Shoe Repair     Works part time  .  [employer]   Social History Main Topics  . Smoking status: Never Smoker  . Smokeless tobacco: Never Used  . Alcohol use No  . Drug use: No  . Sexual activity: Not on file   Other Topics Concern  . Not on file   Social History Narrative   Originally from [place] and has lived with her husband in [place] since 1968.   Fun: Exercises regularly, [place] and have breakfast, walking.   Denies abuse and feels safe at home.             Review of Systems  Constitutional: Negative for activity change, chills, diaphoresis, fatigue and fever.  Respiratory: Negative for chest tightness and shortness of breath.   Cardiovascular:  Negative for chest pain, palpitations and leg swelling.  Gastrointestinal: Negative for abdominal pain, blood in stool, constipation, diarrhea, nausea, rectal pain and vomiting.       Objective:    BP (!) 154/80 (BP Location: Left Arm, Patient Position: Sitting, Cuff Size: Normal)   Pulse 70   Temp 98 F (36.7 C) (Oral)   Resp 16   Ht 5\' 1"  (1.549 m)   Wt 117 lb (53.1 kg)   SpO2 97%   BMI 22.11 kg/m  Nursing note and vital signs reviewed.  Physical Exam  Constitutional: She is oriented to person, place, and time. She appears well-developed and well-nourished. No distress.  Cardiovascular: Normal rate, regular rhythm, normal heart sounds and intact distal pulses.  Exam reveals no gallop and no friction rub.   No murmur heard. Pulmonary/Chest: Effort normal and breath sounds normal. No respiratory distress. She has no wheezes. She has no rales. She exhibits no tenderness.  Abdominal: Soft. Bowel sounds are normal. She exhibits no distension and no mass. There is no tenderness. There is no rebound and no guarding.  Neurological: She is alert and oriented to person, place, and time.  Skin: Skin is warm and dry.  Psychiatric: She has a normal mood and affect. Her behavior is normal. Judgment and thought content normal.        Assessment & Plan:   Problem List Items Addressed This Visit      Cardiovascular and Mediastinum   ATRIAL FIBRILLATION    Auscultation with regular rate and rhythm. No chest pain, shortness of breath, or heart palpitations. No anticoagulation needed at present. Continue to monitor.         Digestive   Malignant neoplasm of colon ([place])    Stable with no symptoms of blood in stool, fevers, chills, nightsweats or weight loss. Bowel pattern is described as normal. No treatment necessary at this time. Continue to monitor.       GERD    Maintained with lifestyle and well controlled with OTC Tums as needed for mild flares. Continue current dosage of OTC  medications as needed. GERD diet discussed. Continue to monitor.         Musculoskeletal and Integument   Osteopenia - Primary    Previously noted to have osteopenia on most recent DEXA scan and currently maintained on Vitamin D and performing weight bearing activities. Obtain new DEXA to update current status. Continue current dosage of Vitamin D and weight bearing activity pending DEXA results.        Relevant Orders   DG DXA FRACTURE ASSESSMENT  Other   Vitamin D deficiency    Currently maintained on vitamin D and appears stable with no adverse side effects. Obtain vitamin D during annual physical exam. Continue current dosage of 1000 units of vitamin D daily.          I have discontinued Ms. Zenor's Multiple Vitamins-Calcium (ONE-A-DAY WOMENS PO) and loratadine. I am also having her maintain her acetaminophen, Multiple Vitamins-Minerals (MULTIPLE VITAMINS/WOMENS PO), Cetirizine HCl, Magnesium, and cholecalciferol.   Meds ordered this encounter  Medications  . acetaminophen (TYLENOL) 325 MG tablet    Sig: Take 650 mg by mouth every 6 (six) hours as needed.  . Multiple Vitamins-Minerals (MULTIPLE VITAMINS/WOMENS PO)    Sig: Take by mouth.  . Cetirizine HCl 10 MG CAPS    Sig: Take by mouth.  . Magnesium 100 MG CAPS    Sig: Take by mouth.  . cholecalciferol (VITAMIN D) 1000 units tablet    Sig: Take 1,000 Units by mouth daily.     Follow-up: Return in about 3 months (around 11/08/2016), or if symptoms worsen or fail to improve, for CPE.  Mauricio Po, FNP

## 2016-08-11 NOTE — Assessment & Plan Note (Signed)
Previously noted to have osteopenia on most recent DEXA scan and currently maintained on Vitamin D and performing weight bearing activities. Obtain new DEXA to update current status. Continue current dosage of Vitamin D and weight bearing activity pending DEXA results.

## 2016-08-11 NOTE — Assessment & Plan Note (Signed)
Currently maintained on vitamin D and appears stable with no adverse side effects. Obtain vitamin D during annual physical exam. Continue current dosage of 1000 units of vitamin D daily.

## 2016-08-11 NOTE — Patient Instructions (Signed)
Thank you for choosing [facility].  SUMMARY AND INSTRUCTIONS:  Please schedule a time for your bone mineral density screen and physical at your convenience.  Please continue to take your supplements.  Follow up:  If your symptoms worsen or fail to improve, please contact our office for further instruction, or in case of emergency go directly to the emergency room at the closest medical facility.

## 2016-08-11 NOTE — Assessment & Plan Note (Signed)
Stable with no symptoms of blood in stool, fevers, chills, nightsweats or weight loss. Bowel pattern is described as normal. No treatment necessary at this time. Continue to monitor.

## 2016-08-11 NOTE — Assessment & Plan Note (Signed)
Auscultation with regular rate and rhythm. No chest pain, shortness of breath, or heart palpitations. No anticoagulation needed at present. Continue to monitor.

## 2016-08-11 NOTE — Assessment & Plan Note (Signed)
Maintained with lifestyle and well controlled with OTC Tums as needed for mild flares. Continue current dosage of OTC medications as needed. GERD diet discussed. Continue to monitor.

## 2016-10-13 ENCOUNTER — Ambulatory Visit (INDEPENDENT_AMBULATORY_CARE_PROVIDER_SITE_OTHER)
Admission: RE | Admit: 2016-10-13 | Discharge: 2016-10-13 | Disposition: A | Discharge status: Home / Self Care | Payer: Medicare Other | Source: Ambulatory Visit | Attending: Family | Admitting: Family

## 2016-10-13 DIAGNOSIS — M8589 Other specified disorders of bone density and structure, multiple sites: Secondary | ICD-10-CM

## 2016-10-20 ENCOUNTER — Telehealth: Payer: Self-pay | Admitting: Family

## 2016-10-20 NOTE — Telephone Encounter (Signed)
Pt aware of results 

## 2016-10-20 NOTE — Telephone Encounter (Signed)
Pt called regarding Dexa results. 4/18

## 2017-03-17 ENCOUNTER — Encounter: Payer: Medicare Other | Admitting: Family

## 2017-03-21 ENCOUNTER — Encounter: Payer: Self-pay | Admitting: Family

## 2017-03-21 ENCOUNTER — Ambulatory Visit (INDEPENDENT_AMBULATORY_CARE_PROVIDER_SITE_OTHER): Payer: Medicare Other | Admitting: Family

## 2017-03-21 VITALS — BP 152/82 | HR 60 | Temp 98.3°F | Resp 16 | Ht 61.0 in | Wt 117.0 lb

## 2017-03-21 DIAGNOSIS — Z0000 Encounter for general adult medical examination without abnormal findings: Secondary | ICD-10-CM

## 2017-03-21 DIAGNOSIS — E559 Vitamin D deficiency, unspecified: Secondary | ICD-10-CM | POA: Diagnosis not present

## 2017-03-21 DIAGNOSIS — I4891 Unspecified atrial fibrillation: Secondary | ICD-10-CM | POA: Diagnosis not present

## 2017-03-21 DIAGNOSIS — E782 Mixed hyperlipidemia: Secondary | ICD-10-CM | POA: Diagnosis not present

## 2017-03-21 NOTE — Assessment & Plan Note (Signed)
Regular rate and rhythm noted on physical exam with no indication of atrial fibrillation. No indication for anticoagulation. Continue to monitor.

## 2017-03-21 NOTE — Assessment & Plan Note (Signed)
Reviewed and updated patient's medical, surgical, family and social history. Medications and allergies were also reviewed. Basic screenings for depression, activities of daily living, hearing, cognition and safety were performed. Provider list was updated and health plan was provided to the patient.  

## 2017-03-21 NOTE — Assessment & Plan Note (Signed)
1) Anticipatory Guidance: Discussed importance of wearing a seatbelt while driving and not texting while driving; changing batteries in smoke detector at least once annually; wearing suntan lotion when outside; eating a balanced and moderate diet; getting physical activity at least 30 minutes per day.  2) Immunizations / Screenings / Labs:  Declines tetanus, pneumonia, and influenza. All other immunizations are up-to-date per recommendations. Breast cancer and cervical cancer screening are no longer indicated. Due for a dental exam encouraged to be completed independently. All other screenings are up to date per recommendations. Obtain CBC, CMET, Vitamin D and lipid profile.    Overall well exam with risk factors for cardiovascular disease including venous insufficiency, hyperlipidemia, and atrial fibrillation. Sinus rhythm noted on physical exam today. Not currently anticoagulated. Hyperlipidemia currently maintained on lifestyle management with patient not wishing to take medications at this time. Continue other healthy lifestyle behaviors and choices. Follow-up prevention exam in 1 year. Follow-up office visit pending blood work and for chronic conditions as needed.

## 2017-03-21 NOTE — Patient Instructions (Signed)
Thank you for choosing [facility].  SUMMARY AND INSTRUCTIONS:  Please continue to take your Tylenol and supplements.  We will check your blood work when fasting.  Schedule an appointment with Caesar Chestnut, NP.    Labs:  Please stop by the lab on the lower level of the building for your blood work. Your results will be released to [facility] (or called to you) after review, usually within 72 hours after test completion. If any changes need to be made, you will be notified at that same time.  1.) The lab is open from 7:30am to 5:30 pm Monday-Friday 2.) No appointment is necessary 3.) Fasting (if needed) is 6-8 hours after food and drink; black coffee and water are okay   Follow up:  If your symptoms worsen or fail to improve, please contact our office for further instruction, or in case of emergency go directly to the emergency room at the closest medical facility.   Health Maintenance  Topic Date Due  . INFLUENZA VACCINE  09/25/2017 (Originally 01/26/2017)  . TETANUS/TDAP  02/27/2018 (Originally 02/17/1952)  . PNA vac Low Risk Adult (1 of 2 - PCV13) 02/27/2018 (Originally 02/18/2000)  . DEXA SCAN  Completed   Health Maintenance, Female Adopting a healthy lifestyle and getting preventive care can go a long way to promote health and wellness. Talk with your health care provider about what schedule of regular examinations is right for you. This is a good chance for you to check in with your provider about disease prevention and staying healthy. In between checkups, there are plenty of things you can do on your own. Experts have done a lot of research about which lifestyle changes and preventive measures are most likely to keep you healthy. Ask your health care provider for more information. Weight and diet Eat a healthy diet  Be sure to include plenty of vegetables, fruits, low-fat dairy products, and lean protein.  Do not eat a lot of foods high in solid fats, added sugars,  or salt.  Get regular exercise. This is one of the most important things you can do for your health. ? Most adults should exercise for at least 150 minutes each week. The exercise should increase your heart rate and make you sweat (moderate-intensity exercise). ? Most adults should also do strengthening exercises at least twice a week. This is in addition to the moderate-intensity exercise.  Maintain a healthy weight  Body mass index (BMI) is a measurement that can be used to identify possible weight problems. It estimates body fat based on height and weight. Your health care provider can help determine your BMI and help you achieve or maintain a healthy weight.  For females 77 years of age and older: ? A BMI below 18.5 is considered underweight. ? A BMI of 18.5 to 24.9 is normal. ? A BMI of 25 to 29.9 is considered overweight. ? A BMI of 30 and above is considered obese.  Watch levels of cholesterol and blood lipids  You should start having your blood tested for lipids and cholesterol at 81 years of age, then have this test every 5 years.  You may need to have your cholesterol levels checked more often if: ? Your lipid or cholesterol levels are high. ? You are older than 81 years of age. ? You are at high risk for heart disease.  Cancer screening Lung Cancer  Lung cancer screening is recommended for adults 61-39 years old who are at high risk for lung cancer  because of a history of smoking.  A yearly low-dose CT scan of the lungs is recommended for people who: ? Currently smoke. ? Have quit within the past 15 years. ? Have at least a 30-pack-year history of smoking. A pack year is smoking an average of one pack of cigarettes a day for 1 year.  Yearly screening should continue until it has been 15 years since you quit.  Yearly screening should stop if you develop a health problem that would prevent you from having lung cancer treatment.  Breast Cancer  Practice breast  self-awareness. This means understanding how your breasts normally appear and feel.  It also means doing regular breast self-exams. Let your health care provider know about any changes, no matter how small.  If you are in your 20s or 30s, you should have a clinical breast exam (CBE) by a health care provider every 1-3 years as part of a regular health exam.  If you are 1 or older, have a CBE every year. Also consider having a breast X-ray (mammogram) every year.  If you have a family history of breast cancer, talk to your health care provider about genetic screening.  If you are at high risk for breast cancer, talk to your health care provider about having an MRI and a mammogram every year.  Breast cancer gene (BRCA) assessment is recommended for women who have family members with BRCA-related cancers. BRCA-related cancers include: ? Breast. ? Ovarian. ? Tubal. ? Peritoneal cancers.  Results of the assessment will determine the need for genetic counseling and BRCA1 and BRCA2 testing.  Cervical Cancer Your health care provider may recommend that you be screened regularly for cancer of the pelvic organs (ovaries, uterus, and vagina). This screening involves a pelvic examination, including checking for microscopic changes to the surface of your cervix (Pap test). You may be encouraged to have this screening done every 3 years, beginning at age 40.  For women ages 88-65, health care providers may recommend pelvic exams and Pap testing every 3 years, or they may recommend the Pap and pelvic exam, combined with testing for human papilloma virus (HPV), every 5 years. Some types of HPV increase your risk of cervical cancer. Testing for HPV may also be done on women of any age with unclear Pap test results.  Other health care providers may not recommend any screening for nonpregnant women who are considered low risk for pelvic cancer and who do not have symptoms. Ask your health care provider if a  screening pelvic exam is right for you.  If you have had past treatment for cervical cancer or a condition that could lead to cancer, you need Pap tests and screening for cancer for at least 20 years after your treatment. If Pap tests have been discontinued, your risk factors (such as having a new sexual partner) need to be reassessed to determine if screening should resume. Some women have medical problems that increase the chance of getting cervical cancer. In these cases, your health care provider may recommend more frequent screening and Pap tests.  Colorectal Cancer  This type of cancer can be detected and often prevented.  Routine colorectal cancer screening usually begins at 81 years of age and continues through 81 years of age.  Your health care provider may recommend screening at an earlier age if you have risk factors for colon cancer.  Your health care provider may also recommend using home test kits to check for hidden blood in the stool.  A small camera at the end of a tube can be used to examine your colon directly (sigmoidoscopy or colonoscopy). This is done to check for the earliest forms of colorectal cancer.  Routine screening usually begins at age 23.  Direct examination of the colon should be repeated every 5-10 years through 81 years of age. However, you may need to be screened more often if early forms of precancerous polyps or small growths are found.  Skin Cancer  Check your skin from head to toe regularly.  Tell your health care provider about any new moles or changes in moles, especially if there is a change in a mole's shape or color.  Also tell your health care provider if you have a mole that is larger than the size of a pencil eraser.  Always use sunscreen. Apply sunscreen liberally and repeatedly throughout the day.  Protect yourself by wearing long sleeves, pants, a wide-brimmed hat, and sunglasses whenever you are outside.  Heart disease, diabetes, and  high blood pressure  High blood pressure causes heart disease and increases the risk of stroke. High blood pressure is more likely to develop in: ? People who have blood pressure in the high end of the normal range (130-139/85-89 mm Hg). ? People who are overweight or obese. ? People who are African American.  If you are 60-17 years of age, have your blood pressure checked every 3-5 years. If you are 70 years of age or older, have your blood pressure checked every year. You should have your blood pressure measured twice-once when you are at a hospital or clinic, and once when you are not at a hospital or clinic. Record the average of the two measurements. To check your blood pressure when you are not at a hospital or clinic, you can use: ? An automated blood pressure machine at a pharmacy. ? A home blood pressure monitor.  If you are between 26 years and 61 years old, ask your health care provider if you should take aspirin to prevent strokes.  Have regular diabetes screenings. This involves taking a blood sample to check your fasting blood sugar level. ? If you are at a normal weight and have a low risk for diabetes, have this test once every three years after 81 years of age. ? If you are overweight and have a high risk for diabetes, consider being tested at a younger age or more often. Preventing infection Hepatitis B  If you have a higher risk for hepatitis B, you should be screened for this virus. You are considered at high risk for hepatitis B if: ? You were born in a country where hepatitis B is common. Ask your health care provider which countries are considered high risk. ? Your parents were born in a high-risk country, and you have not been immunized against hepatitis B (hepatitis B vaccine). ? You have HIV or AIDS. ? You use needles to inject street drugs. ? You live with someone who has hepatitis B. ? You have had sex with someone who has hepatitis B. ? You get hemodialysis  treatment. ? You take certain medicines for conditions, including cancer, organ transplantation, and autoimmune conditions.  Hepatitis C  Blood testing is recommended for: ? Everyone born from [birth year] through 1963. ? Anyone with known risk factors for hepatitis C.  Sexually transmitted infections (STIs)  You should be screened for sexually transmitted infections (STIs) including gonorrhea and chlamydia if: ? You are sexually active and are younger than 81  years of age. ? You are older than 81 years of age and your health care provider tells you that you are at risk for this type of infection. ? Your sexual activity has changed since you were last screened and you are at an increased risk for chlamydia or gonorrhea. Ask your health care provider if you are at risk.  If you do not have HIV, but are at risk, it may be recommended that you take a prescription medicine daily to prevent HIV infection. This is called pre-exposure prophylaxis (PrEP). You are considered at risk if: ? You are sexually active and do not regularly use condoms or know the HIV status of your partner(s). ? You take drugs by injection. ? You are sexually active with a partner who has HIV.  Talk with your health care provider about whether you are at high risk of being infected with HIV. If you choose to begin PrEP, you should first be tested for HIV. You should then be tested every 3 months for as long as you are taking PrEP. Pregnancy  If you are premenopausal and you may become pregnant, ask your health care provider about preconception counseling.  If you may become pregnant, take 400 to 800 micrograms (mcg) of folic acid every day.  If you want to prevent pregnancy, talk to your health care provider about birth control (contraception). Osteoporosis and menopause  Osteoporosis is a disease in which the bones lose minerals and strength with aging. This can result in serious bone fractures. Your risk for osteoporosis  can be identified using a bone density scan.  If you are 18 years of age or older, or if you are at risk for osteoporosis and fractures, ask your health care provider if you should be screened.  Ask your health care provider whether you should take a calcium or vitamin D supplement to lower your risk for osteoporosis.  Menopause may have certain physical symptoms and risks.  Hormone replacement therapy may reduce some of these symptoms and risks. Talk to your health care provider about whether hormone replacement therapy is right for you. Follow these instructions at home:  Schedule regular health, dental, and eye exams.  Stay current with your immunizations.  Do not use any tobacco products including cigarettes, chewing tobacco, or electronic cigarettes.  If you are pregnant, do not drink alcohol.  If you are breastfeeding, limit how much and how often you drink alcohol.  Limit alcohol intake to no more than 1 drink per day for nonpregnant women. One drink equals 12 ounces of beer, 5 ounces of wine, or 1 ounces of hard liquor.  Do not use street drugs.  Do not share needles.  Ask your health care provider for help if you need support or information about quitting drugs.  Tell your health care provider if you often feel depressed.  Tell your health care provider if you have ever been abused or do not feel safe at home. This information is not intended to replace advice given to you by your health care provider. Make sure you discuss any questions you have with your health care provider. Document Released: 12/28/2010 Document Revised: 11/20/2015 Document Reviewed: 03/18/2015 Elsevier Interactive Patient Education  [employer].

## 2017-03-21 NOTE — Assessment & Plan Note (Signed)
Stable with lifestyle management. Obtain lipid profile. Continue lifestyle management pending lipid profile results.

## 2017-03-21 NOTE — Progress Notes (Signed)
Subjective:    Patient ID: Brenda Hammond, female    DOB: 1933/01/26, 83 y.o.   MRN: [id number]  Chief Complaint  Patient presents with  . CPE    not fasting    HPI:  Brenda Hammond is a 83 y.o. female who presents today for a Medicare Annual Wellness/Physical exam.    1) Health Maintenance -   Diet - Averages about 3-4 small meals throughout the day; Caffeine intake of about 3-4 cups daily   Exercise - Walks on a regular basis   2) Preventative Exams / Immunizations:  Dental -- Due for exam   Vision -- Up to date   Health Maintenance  Topic Date Due  . INFLUENZA VACCINE  09/25/2017 (Originally 01/26/2017)  . TETANUS/TDAP  02/27/2018 (Originally 02/17/1952)  . PNA vac Low Risk Adult (1 of 2 - PCV13) 02/27/2018 (Originally 02/18/2000)  . DEXA SCAN  Completed    There is no immunization history on file for this patient.  RISK FACTORS  Tobacco History  Smoking Status  . Never Smoker  Smokeless Tobacco  . Never Used     Cardiac risk factors: advanced age (older than 59 for men, 39 for women) and dyslipidemia.  Depression Screen  Depression screen [facility] 2/9 08/11/2016  Decreased Interest 0  Down, Depressed, Hopeless 0  PHQ - 2 Score 0     Activities of Daily Living In your present state of health, do you have any difficulty performing the following activities?:  Driving? Not currently driving  Managing money?  No Feeding yourself? No Getting from bed to chair? No Climbing a flight of stairs? No Preparing food and eating?: No Bathing or showering? No Getting dressed: No Getting to the toilet? No Using the toilet: No Moving around from place to place: No In the past year have you fallen or had a near fall?: No   Home Safety Has smoke detector and wears seat belt. No excess sun exposure. Are there smokers in your home (other than you)?  No Do you feel safe at home?  Yes  Hearing Difficulties: No Do you often ask people to speak up or repeat  themselves? No Do you experience ringing or noises in your ears? No  Do you have difficulty understanding soft or whispered voices? No    Cognitive Testing  Alert? Yes   Normal Appearance? Yes  Oriented to person? Yes  Place? Yes   Time? Yes  Recall of three objects?  Yes  Can perform simple calculations? Yes  Displays appropriate judgment? Yes  Can read the correct time from a watch face? Yes  Do you feel that you have a problem with memory? No  Do you often misplace items? No   Advanced Directives have been discussed with the patient? Yes, has advanced directive in place   Current Physicians/Providers and Suppliers  1. Terri Piedra, FNP - Internal Medicine 2. Rutherford Guys, MD - Opthalmology  Indicate any recent Medical Services you may have received from other than [facility] providers in the past year (date may be approximate).  All answers were reviewed with the patient and necessary referrals were made:  Mauricio Po, FNP   03/21/2017    Allergies  Allergen Reactions  . Alendronate Other (See Comments)    "twitching"  . Ampicillin     REACTION: rash and high fever  . Aspirin     REACTION: ears ringing  . Cyclobenzaprine Hcl     REACTION: nightmares  . Metaxalone  REACTION: nightmares  . Nitrofurantoin     REACTION: elevated BP and chest pain  . Nsaids     REACTION: rash  . Penicillins     REACTION: rash, high fever  . Sulfonamide Derivatives     REACTION: elevated HR and BP  . Nortriptyline Hcl Rash     Outpatient Medications Prior to Visit  Medication Sig Dispense Refill  . acetaminophen (TYLENOL) 325 MG tablet Take 650 mg by mouth every 6 (six) hours as needed.    . cholecalciferol (VITAMIN D) 1000 units tablet Take 1,000 Units by mouth daily.    . Magnesium 100 MG CAPS Take by mouth.    . Multiple Vitamins-Minerals (MULTIPLE VITAMINS/WOMENS PO) Take by mouth.    . Cetirizine HCl 10 MG CAPS Take by mouth.     No facility-administered medications  prior to visit.      Past Medical History:  Diagnosis Date  . Anemia   . Anxiety   . Atrial fibrillation ([place])   . Colon adenocarcinoma ([place])   . DJD (degenerative joint disease)   . Fibromyalgia   . GERD (gastroesophageal reflux disease)   . History of poliomyelitis   . History of UTI   . Hyperlipemia   . Other drug allergy(995.27)   . Scoliosis   . Surgical or other procedure not carried out because of patient's decision   . Venous insufficiency      Past Surgical History:  Procedure Laterality Date  . APPENDECTOMY    . LOW ANTERIOR BOWEL RESECTION  06/2009   with anastomosis for colon cancer  . OTHER SURGICAL HISTORY     T & A  . TOTAL ABDOMINAL HYSTERECTOMY W/ BILATERAL SALPINGOOPHORECTOMY  1980s     Family History  Problem Relation Age of Onset  . Stroke Father   . Cancer Mother        brain  . Colon cancer Neg Hx      Social History   Social History  . Marital status: Married    Spouse name: Annalysa Mohammad  . Number of children: 1  . Years of education: 32   Occupational History  . Shoe Repair     Works part time  .  [employer]   Social History Main Topics  . Smoking status: Never Smoker  . Smokeless tobacco: Never Used  . Alcohol use No  . Drug use: No  . Sexual activity: Not on file   Other Topics Concern  . Not on file   Social History Narrative   Originally from [place] and has lived with her husband in [place] since 1968.   Fun: Exercises regularly, [place] and have breakfast, walking.   Denies abuse and feels safe at home.            Review of Systems  Constitutional: Denies fever, chills, fatigue, or significant weight gain/loss. HENT: Head: Denies headache or neck pain Ears: Denies changes in hearing, ringing in ears, earache, drainage Nose: Denies discharge, stuffiness, itching, nosebleed, sinus pain Throat: Denies sore throat, hoarseness, dry mouth, sores, thrush Eyes: Denies loss/changes in vision, pain,  redness, blurry/double vision, flashing lights Cardiovascular: Denies chest pain/discomfort, tightness, palpitations, shortness of breath with activity, difficulty lying down, swelling, sudden awakening with shortness of breath Respiratory: Denies shortness of breath, cough, sputum production, wheezing Gastrointestinal: Denies dysphasia, heartburn, change in appetite, nausea, change in bowel habits, rectal bleeding, constipation, diarrhea, yellow skin or eyes Genitourinary: Denies frequency, urgency, burning/pain, blood in urine, incontinence, change  in urinary strength. Musculoskeletal: Denies muscle/joint pain, stiffness, back pain, redness or swelling of joints, trauma Skin: Denies rashes, lumps, itching, dryness, color changes, or hair/nail changes Neurological: Denies dizziness, fainting, seizures, weakness, numbness, tingling, tremor Psychiatric - Denies nervousness, stress, depression or memory loss Endocrine: Denies heat or cold intolerance, sweating, frequent urination, excessive thirst, changes in appetite Hematologic: Denies ease of bruising or bleeding    Objective:     BP (!) 152/82 (BP Location: Left Arm, Patient Position: Sitting, Cuff Size: Normal)   Pulse 60   Temp 98.3 F (36.8 C) (Oral)   Resp 16   Ht 5\' 1"  (1.549 m)   Wt 117 lb (53.1 kg)   SpO2 98%   BMI 22.11 kg/m  Nursing note and vital signs reviewed.  Physical Exam  Constitutional: She is oriented to person, place, and time. She appears well-developed and well-nourished. No distress.  HENT:  Head: Normocephalic.  Right Ear: Hearing, tympanic membrane, external ear and ear canal normal.  Left Ear: Hearing, tympanic membrane, external ear and ear canal normal.  Nose: Nose normal.  Mouth/Throat: Uvula is midline, oropharynx is clear and moist and mucous membranes are normal.  Eyes: Pupils are equal, round, and reactive to light. Conjunctivae and EOM are normal.  Neck: Neck supple. No JVD present. No tracheal  deviation present. No thyromegaly present.  Cardiovascular: Normal rate, regular rhythm, normal heart sounds and intact distal pulses.   Pulmonary/Chest: Effort normal and breath sounds normal.  Abdominal: Soft. Bowel sounds are normal. She exhibits no distension and no mass. There is no tenderness. There is no rebound and no guarding.  Musculoskeletal: Normal range of motion. She exhibits no edema or tenderness.  Lymphadenopathy:    She has no cervical adenopathy.  Neurological: She is alert and oriented to person, place, and time. She has normal reflexes. No cranial nerve deficit. She exhibits normal muscle tone. Coordination normal.  Skin: Skin is warm and dry.  Psychiatric: She has a normal mood and affect. Her behavior is normal. Judgment and thought content normal.       Assessment & Plan:   During the course of the visit the patient was educated and counseled about appropriate screening and preventive services including:    Pneumococcal vaccine   Influenza vaccine  Td vaccine  Colorectal cancer screening  Glaucoma screening  Nutrition counseling   Diet review for nutrition referral? Yes ____  Not Indicated _X___   Patient Instructions (the written plan) was given to the patient.  Medicare Attestation I have personally reviewed: The patient's medical and social history Their use of alcohol, tobacco or illicit drugs Their current medications and supplements The patient's functional ability including ADLs,fall risks, home safety risks, cognitive, and hearing and visual impairment Diet and physical activities Evidence for depression or mood disorders  The patient's weight, height, BMI,  have been recorded in the chart.  I have made referrals, counseling, and provided education to the patient based on review of the above and I have provided the patient with a written personalized care plan for preventive services.     Problem List Items Addressed This Visit       Cardiovascular and Mediastinum   ATRIAL FIBRILLATION    Regular rate and rhythm noted on physical exam with no indication of atrial fibrillation. No indication for anticoagulation. Continue to monitor.        Other   Hyperlipidemia    Stable with lifestyle management. Obtain lipid profile. Continue lifestyle management pending lipid  profile results.      Vitamin D deficiency   Relevant Orders   VITAMIN D 25 Hydroxy (Vit-D Deficiency, Fractures)   Medicare annual wellness visit, subsequent - Primary    Reviewed and updated patient's medical, surgical, family and social history. Medications and allergies were also reviewed. Basic screenings for depression, activities of daily living, hearing, cognition and safety were performed. Provider list was updated and health plan was provided to the patient.        Routine adult health maintenance    1) Anticipatory Guidance: Discussed importance of wearing a seatbelt while driving and not texting while driving; changing batteries in smoke detector at least once annually; wearing suntan lotion when outside; eating a balanced and moderate diet; getting physical activity at least 30 minutes per day.  2) Immunizations / Screenings / Labs:  Declines tetanus, pneumonia, and influenza. All other immunizations are up-to-date per recommendations. Breast cancer and cervical cancer screening are no longer indicated. Due for a dental exam encouraged to be completed independently. All other screenings are up to date per recommendations. Obtain CBC, CMET, Vitamin D and lipid profile.    Overall well exam with risk factors for cardiovascular disease including venous insufficiency, hyperlipidemia, and atrial fibrillation. Sinus rhythm noted on physical exam today. Not currently anticoagulated. Hyperlipidemia currently maintained on lifestyle management with patient not wishing to take medications at this time. Continue other healthy lifestyle behaviors and choices.  Follow-up prevention exam in 1 year. Follow-up office visit pending blood work and for chronic conditions as needed.       Relevant Orders   CBC   Comprehensive metabolic panel   Lipid panel       I have discontinued Ms. Blankenbeckler's Cetirizine HCl. I am also having her maintain her acetaminophen, Multiple Vitamins-Minerals (MULTIPLE VITAMINS/WOMENS PO), Magnesium, and cholecalciferol.   Follow-up: Return in about 1 year (around 03/21/2018), or if symptoms worsen or fail to improve.   Mauricio Po, FNP

## 2017-06-15 ENCOUNTER — Ambulatory Visit (INDEPENDENT_AMBULATORY_CARE_PROVIDER_SITE_OTHER): Payer: Medicare Other | Admitting: Nurse Practitioner

## 2017-06-15 ENCOUNTER — Encounter: Payer: Self-pay | Admitting: Nurse Practitioner

## 2017-06-15 ENCOUNTER — Ambulatory Visit: Payer: Medicare Other | Admitting: Nurse Practitioner

## 2017-06-15 VITALS — BP 136/86 | HR 61 | Temp 97.6°F | Resp 16 | Ht 61.0 in | Wt 118.8 lb

## 2017-06-15 DIAGNOSIS — E782 Mixed hyperlipidemia: Secondary | ICD-10-CM

## 2017-06-15 DIAGNOSIS — E559 Vitamin D deficiency, unspecified: Secondary | ICD-10-CM | POA: Diagnosis not present

## 2017-06-15 DIAGNOSIS — I4891 Unspecified atrial fibrillation: Secondary | ICD-10-CM | POA: Diagnosis not present

## 2017-06-15 NOTE — Assessment & Plan Note (Signed)
History is not concerning for atrial fibrillation. Rate and rhythm normal on PE. She would prefer no cardiology F/U unless symptoms return. Will continue to monitor

## 2017-06-15 NOTE — Progress Notes (Signed)
Subjective:    Patient ID: Brenda Hammond, female    DOB: [birth year], 83 y.o.   MRN: [id number]  HPI Brenda Hammond is an 83 yo who presents today to establish care. She is transferring to me from another provider in the same clinic. She has the following significant medical problems: atrial fibrillation, venous insufficiency, malignantat neoplasm of colon, GERD, osteoarthritis, osteopenia, hyperlipidemia, anemia, anxiety, fibromyalgia, vitamin d deficiency.  She was seen last month for her annual wellness, she did not complete labs ordered that day. She felt like she did not need any routine lab work because she feels well and does not have any complaints. She does say today that she would be interested in routine labs if they were recommended. 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 She says that besides taking tylenol 650mg  three times daily for her osteoarthritis pain and her daily OTC multiple vitamins, magnesium and D, she does not take any other routine medications. She prefers to avoid medication, as she has had low tolerance and allergies to multiple medications in the past.  She has been followed by gastroenterology for colon neoplasm and cardiology for atrial fibrilliatoin in the past, but says she was released by both specialties. Her atrial fibrillation has resolved and she no longer experiences symptoms. Her last colonocopy was done in 2015 with a receommendation for 5 year follow up only if the patient desires.  BP Readings from Last 3 Encounters:  06/15/17 136/86  03/21/17 (!) 152/82  08/11/16 (!) 154/80    Review of Systems  Constitutional: Negative for activity change and appetite change.  Respiratory: Negative for cough and shortness of breath.   Cardiovascular: Negative for chest pain and palpitations.  Gastrointestinal: Negative for nausea and vomiting.  Musculoskeletal: Negative for gait problem.  Neurological: Negative  for weakness and light-headedness.  Psychiatric/Behavioral: Negative for confusion. The patient is not nervous/anxious.      Past Medical History:  Diagnosis Date  . Anemia   . Anxiety   . Atrial fibrillation ([place])   . Colon adenocarcinoma ([place])   . DJD (degenerative joint disease)   . Fibromyalgia   . GERD (gastroesophageal reflux disease)   . History of poliomyelitis   . History of UTI   . Hyperlipemia   . Other drug allergy(995.27)   . Scoliosis   . Surgical or other procedure not carried out because of patient's decision   . Venous insufficiency      Social History   Socioeconomic History  . Marital status: Married    Spouse name: Arion Morgan  . Number of children: 1  . Years of education: 75  . Highest education level: Not on file  Social Needs  . Financial resource strain: Not on file  . Food insecurity - worry: Not on file  . Food insecurity - inability: Not on file  . Transportation needs - medical: Not on file  . Transportation needs - non-medical: Not on file  Occupational History  . Occupation: Office manager    Comment: Works part time    Employer: [employer]  Tobacco Use  . Smoking status: Never Smoker  . Smokeless tobacco: Never Used  Substance and Sexual Activity  . Alcohol use: No  . Drug use: No  . Sexual activity: Not on file  Other Topics Concern  . Not on file  Social History Narrative   Originally from [facility] [place] [facility] and has lived with  her husband in [place] since 1968.   Fun: Exercises regularly, [place] and have breakfast, walking.   Denies abuse and feels safe at home.        Past Surgical History:  Procedure Laterality Date  . APPENDECTOMY    . LOW ANTERIOR BOWEL RESECTION  06/2009   with anastomosis for colon cancer  . OTHER SURGICAL HISTORY     T & A  . TOTAL ABDOMINAL HYSTERECTOMY W/ BILATERAL SALPINGOOPHORECTOMY  1980s    Family History  Problem Relation Age of Onset  . Stroke Father   . Cancer Mother         brain  . Colon cancer Neg Hx     Allergies  Allergen Reactions  . Alendronate Other (See Comments)    "twitching"  . Ampicillin     REACTION: rash and high fever  . Aspirin     REACTION: ears ringing  . Cyclobenzaprine Hcl     REACTION: nightmares  . Metaxalone     REACTION: nightmares  . Nitrofurantoin     REACTION: elevated BP and chest pain  . Nsaids     REACTION: rash  . Penicillins     REACTION: rash, high fever  . Sulfonamide Derivatives     REACTION: elevated HR and BP  . Nortriptyline Hcl Rash    Current Outpatient Medications on File Prior to Visit  Medication Sig Dispense Refill  . acetaminophen (TYLENOL) 325 MG tablet Take 650 mg by mouth every 6 (six) hours as needed.    . cholecalciferol (VITAMIN D) 1000 units tablet Take 1,000 Units by mouth daily.    . Magnesium 100 MG CAPS Take by mouth.    . Multiple Vitamins-Minerals (MULTIPLE VITAMINS/WOMENS PO) Take by mouth.     No current facility-administered medications on file prior to visit.     BP 136/86 (BP Location: Left Arm, Patient Position: Sitting, Cuff Size: Normal)   Pulse 61   Temp 97.6 F (36.4 C)   Resp 16   Ht 5\' 1"  (1.549 m)   Wt 118 lb 12.8 oz (53.9 kg)   SpO2 96%   BMI 22.45 kg/m       Objective:   Physical Exam  Constitutional: She is oriented to person, place, and time. She appears well-developed and well-nourished. No distress.  HENT:  Head: Normocephalic and atraumatic.  Neck: Normal range of motion. Neck supple.  Cardiovascular: Normal rate, regular rhythm, normal heart sounds and intact distal pulses.  Pulmonary/Chest: Effort normal and breath sounds normal. No respiratory distress.  Musculoskeletal: Normal range of motion.  Neurological: She is alert and oriented to person, place, and time. Coordination normal.  Skin: Skin is warm and dry.  Psychiatric: She has a normal mood and affect. Judgment and thought content normal.      Assessment & Plan:  She is interested in  routine lab work today, that she did not complete at her annual physical. Orders placed, she will return when fasting RTC for annual physical, or sooner if needed.  Mixed hyperlipidemia Diagnostic testing ordered: - Lipid panel; Future - CBC; Future - Comprehensive metabolic panel; Future  Vitamin D deficiency Diagnostic testing ordered: - Vitamin D (25 hydroxy); Future

## 2017-06-15 NOTE — Patient Instructions (Addendum)
Please return for routine lab work when you have been fasting for at least 8 hours.  Ill see you back in September for your annual physical, or sooner if you need me.  It was so very nice to meet you today. Happy holidays!

## 2017-10-17 ENCOUNTER — Encounter: Payer: Self-pay | Admitting: Podiatry

## 2017-10-17 ENCOUNTER — Ambulatory Visit: Payer: Medicare Other | Admitting: Podiatry

## 2017-10-17 VITALS — BP 151/75 | HR 66

## 2017-10-17 DIAGNOSIS — Q828 Other specified congenital malformations of skin: Secondary | ICD-10-CM | POA: Diagnosis not present

## 2017-10-17 DIAGNOSIS — B351 Tinea unguium: Secondary | ICD-10-CM

## 2017-10-17 DIAGNOSIS — M79674 Pain in right toe(s): Secondary | ICD-10-CM | POA: Diagnosis not present

## 2017-10-17 DIAGNOSIS — M79675 Pain in left toe(s): Secondary | ICD-10-CM | POA: Diagnosis not present

## 2017-10-17 NOTE — Progress Notes (Signed)
.   This patient presents to the office with chief complaint of painful, thick, ingrowing toenails both big toes.  She says she is also has a painful callus on the bottom of her right forefoot.  This callus has been present for over 6 months.  She is to referred to this office by [facility].  She does have a history of polio and fibromyalgia.   She presents the office today for an evaluation and treatment of her painful nails and callus.   Podiatric Exam: Vascular: dorsalis pedis and posterior tibial pulses are palpable bilateral. Capillary return is immediate. Temperature gradient is WNL. Skin turgor WNL  Sensorium: Normal Semmes Weinstein monofilament test. Normal tactile sensation bilaterally. Nail Exam: Pt has thick disfigured discolored nails with subungual debris noted bilateral entire nail hallux .  Pincer hallux nails  B/L. Ulcer Exam: There is no evidence of ulcer or pre-ulcerative changes or infection. Orthopedic Exam: Muscle tone and strength are WNL. No limitations in general ROM. No crepitus or effusions noted. Foot type and digits show no abnormalities. Bony prominences are unremarkable. Skin: No Porokeratosis. No infection or ulcers  Onychomycosis  B/L  Porokeratosis  Right foot.  IE.  Debride nails  Debride porokeratosis  Padding.  RTC 4 months.   Gardiner Barefoot DPM

## 2017-11-15 ENCOUNTER — Telehealth: Payer: Self-pay | Admitting: Nurse Practitioner

## 2017-11-15 NOTE — Telephone Encounter (Signed)
Spoke to patient regarding AWV. Patient stated that she will give office a call back to schedule her appointment. SF

## 2018-02-15 ENCOUNTER — Encounter: Payer: Self-pay | Admitting: Podiatry

## 2018-02-15 ENCOUNTER — Ambulatory Visit: Payer: Medicare Other | Admitting: Podiatry

## 2018-02-15 DIAGNOSIS — M79674 Pain in right toe(s): Secondary | ICD-10-CM | POA: Diagnosis not present

## 2018-02-15 DIAGNOSIS — M79675 Pain in left toe(s): Secondary | ICD-10-CM

## 2018-02-15 DIAGNOSIS — B351 Tinea unguium: Secondary | ICD-10-CM | POA: Diagnosis not present

## 2018-02-15 DIAGNOSIS — Q828 Other specified congenital malformations of skin: Secondary | ICD-10-CM | POA: Diagnosis not present

## 2018-02-15 NOTE — Progress Notes (Signed)
.   This patient presents to the office with chief complaint of painful, thick, ingrowing toenails both big toes.  She says she is also has a painful callus on the bottom of her right forefoot.  This callus has been present for over 6 months.  She is to referred to this office by [facility].  She does have a history of polio and fibromyalgia.   She presents the office today for an evaluation and treatment of her painful nails and callus.   Podiatric Exam: Vascular: dorsalis pedis and posterior tibial pulses are palpable bilateral. Capillary return is immediate. Temperature gradient is WNL. Skin turgor WNL  Sensorium: Normal Semmes Weinstein monofilament test. Normal tactile sensation bilaterally. Nail Exam: Pt has thick disfigured discolored nails with subungual debris noted bilateral entire nail hallux .  Pincer hallux nails  B/L. Ulcer Exam: There is no evidence of ulcer or pre-ulcerative changes or infection. Orthopedic Exam: Muscle tone and strength are WNL. No limitations in general ROM. No crepitus or effusions noted. Foot type and digits show no abnormalities. Bony prominences are unremarkable. Skin:  Porokeratosis. sub2 right foot. No infection or ulcers  Onychomycosis  B/L  Porokeratosis  Right foot.  IE.  Debride nails  Debride porokeratosis  Consult with Liliane Channel.  RTC 4 months.   Gardiner Barefoot DPM

## 2018-03-15 ENCOUNTER — Encounter: Payer: Medicare Other | Admitting: Nurse Practitioner

## 2018-04-12 ENCOUNTER — Other Ambulatory Visit (INDEPENDENT_AMBULATORY_CARE_PROVIDER_SITE_OTHER): Payer: Medicare Other

## 2018-04-12 ENCOUNTER — Ambulatory Visit (INDEPENDENT_AMBULATORY_CARE_PROVIDER_SITE_OTHER): Payer: Medicare Other | Admitting: Nurse Practitioner

## 2018-04-12 ENCOUNTER — Encounter: Payer: Self-pay | Admitting: Nurse Practitioner

## 2018-04-12 VITALS — BP 110/68 | HR 60 | Ht 61.0 in | Wt 116.0 lb

## 2018-04-12 DIAGNOSIS — E559 Vitamin D deficiency, unspecified: Secondary | ICD-10-CM

## 2018-04-12 DIAGNOSIS — Z0001 Encounter for general adult medical examination with abnormal findings: Secondary | ICD-10-CM

## 2018-04-12 DIAGNOSIS — M2550 Pain in unspecified joint: Secondary | ICD-10-CM

## 2018-04-12 DIAGNOSIS — Z23 Encounter for immunization: Secondary | ICD-10-CM | POA: Diagnosis not present

## 2018-04-12 DIAGNOSIS — R350 Frequency of micturition: Secondary | ICD-10-CM

## 2018-04-12 DIAGNOSIS — Z13220 Encounter for screening for lipoid disorders: Secondary | ICD-10-CM | POA: Diagnosis not present

## 2018-04-12 DIAGNOSIS — Z0000 Encounter for general adult medical examination without abnormal findings: Secondary | ICD-10-CM

## 2018-04-12 LAB — URINALYSIS, ROUTINE W REFLEX MICROSCOPIC
Bilirubin Urine: NEGATIVE
Hgb urine dipstick: NEGATIVE
KETONES UR: NEGATIVE
Leukocytes, UA: NEGATIVE
Nitrite: NEGATIVE
PH: 6 (ref 5.0–8.0)
RBC / HPF: NONE SEEN (ref 0–?)
SPECIFIC GRAVITY, URINE: 1.02 (ref 1.000–1.030)
Total Protein, Urine: NEGATIVE
UROBILINOGEN UA: 0.2 (ref 0.0–1.0)
Urine Glucose: NEGATIVE

## 2018-04-12 LAB — COMPREHENSIVE METABOLIC PANEL
ALK PHOS: 86 U/L (ref 39–117)
ALT: 10 U/L (ref 0–35)
AST: 21 U/L (ref 0–37)
Albumin: 4.1 g/dL (ref 3.5–5.2)
BILIRUBIN TOTAL: 0.3 mg/dL (ref 0.2–1.2)
BUN: 15 mg/dL (ref 6–23)
CO2: 30 meq/L (ref 19–32)
CREATININE: 0.83 mg/dL (ref 0.40–1.20)
Calcium: 9.6 mg/dL (ref 8.4–10.5)
Chloride: 104 mEq/L (ref 96–112)
GFR: 69.75 mL/min (ref >60.00)
GLUCOSE: 123 mg/dL — AB (ref 70–99)
Potassium: 4.1 mEq/L (ref 3.5–5.1)
Sodium: 141 mEq/L (ref 135–145)
TOTAL PROTEIN: 7 g/dL (ref 6.0–8.3)

## 2018-04-12 LAB — CBC
HCT: 37.2 % (ref 36.0–46.0)
HEMOGLOBIN: 12.3 g/dL (ref 12.0–15.0)
MCHC: 33.1 g/dL (ref 30.0–36.0)
MCV: 90.4 fl (ref 78.0–100.0)
PLATELETS: 240 10*3/uL (ref 150.0–400.0)
RBC: 4.12 Mil/uL (ref 3.87–5.11)
RDW: 13.9 % (ref 11.5–15.5)
WBC: 7.7 10*3/uL (ref 4.0–10.5)

## 2018-04-12 LAB — SEDIMENTATION RATE: Sed Rate: 23 mm/hr (ref 0–30)

## 2018-04-12 LAB — LDL CHOLESTEROL, DIRECT: LDL DIRECT: 125 mg/dL

## 2018-04-12 LAB — C-REACTIVE PROTEIN: CRP: 0.4 mg/dL — AB (ref 0.5–20.0)

## 2018-04-12 LAB — VITAMIN D 25 HYDROXY (VIT D DEFICIENCY, FRACTURES): VITD: 62.53 ng/mL (ref 30.00–100.00)

## 2018-04-12 LAB — LIPID PANEL
Cholesterol: 206 mg/dL — ABNORMAL HIGH (ref 0–200)
HDL: 55.7 mg/dL (ref >39.00)
NONHDL: 150.46
Total CHOL/HDL Ratio: 4
Triglycerides: 301 mg/dL — ABNORMAL HIGH (ref 0.0–149.0)
VLDL: 60.2 mg/dL — ABNORMAL HIGH (ref 0.0–40.0)

## 2018-04-12 NOTE — Patient Instructions (Signed)
Please head downstairs for lab work.  It was nice to see you today.  Health Maintenance, Female Adopting a healthy lifestyle and getting preventive care can go a long way to promote health and wellness. Talk with your health care provider about what schedule of regular examinations is right for you. This is a good chance for you to check in with your provider about disease prevention and staying healthy. In between checkups, there are plenty of things you can do on your own. Experts have done a lot of research about which lifestyle changes and preventive measures are most likely to keep you healthy. Ask your health care provider for more information. Weight and diet Eat a healthy diet  Be sure to include plenty of vegetables, fruits, low-fat dairy products, and lean protein.  Do not eat a lot of foods high in solid fats, added sugars, or salt.  Get regular exercise. This is one of the most important things you can do for your health. ? Most adults should exercise for at least 150 minutes each week. The exercise should increase your heart rate and make you sweat (moderate-intensity exercise). ? Most adults should also do strengthening exercises at least twice a week. This is in addition to the moderate-intensity exercise.  Maintain a healthy weight  Body mass index (BMI) is a measurement that can be used to identify possible weight problems. It estimates body fat based on height and weight. Your health care provider can help determine your BMI and help you achieve or maintain a healthy weight.  For females 1 years of age and older: ? A BMI below 18.5 is considered underweight. ? A BMI of 18.5 to 24.9 is normal. ? A BMI of 25 to 29.9 is considered overweight. ? A BMI of 30 and above is considered obese.  Watch levels of cholesterol and blood lipids  You should start having your blood tested for lipids and cholesterol at 82 years of age, then have this test every 5 years.  You may need  to have your cholesterol levels checked more often if: ? Your lipid or cholesterol levels are high. ? You are older than 82 years of age. ? You are at high risk for heart disease.  Cancer screening Lung Cancer  Lung cancer screening is recommended for adults 63-73 years old who are at high risk for lung cancer because of a history of smoking.  A yearly low-dose CT scan of the lungs is recommended for people who: ? Currently smoke. ? Have quit within the past 15 years. ? Have at least a 30-pack-year history of smoking. A pack year is smoking an average of one pack of cigarettes a day for 1 year.  Yearly screening should continue until it has been 15 years since you quit.  Yearly screening should stop if you develop a health problem that would prevent you from having lung cancer treatment.  Breast Cancer  Practice breast self-awareness. This means understanding how your breasts normally appear and feel.  It also means doing regular breast self-exams. Let your health care provider know about any changes, no matter how small.  If you are in your 20s or 30s, you should have a clinical breast exam (CBE) by a health care provider every 1-3 years as part of a regular health exam.  If you are 46 or older, have a CBE every year. Also consider having a breast X-ray (mammogram) every year.  If you have a family history of breast cancer, talk  to your health care provider about genetic screening.  If you are at high risk for breast cancer, talk to your health care provider about having an MRI and a mammogram every year.  Breast cancer gene (BRCA) assessment is recommended for women who have family members with BRCA-related cancers. BRCA-related cancers include: ? Breast. ? Ovarian. ? Tubal. ? Peritoneal cancers.  Results of the assessment will determine the need for genetic counseling and BRCA1 and BRCA2 testing.  Cervical Cancer Your health care provider may recommend that you be  screened regularly for cancer of the pelvic organs (ovaries, uterus, and vagina). This screening involves a pelvic examination, including checking for microscopic changes to the surface of your cervix (Pap test). You may be encouraged to have this screening done every 3 years, beginning at age 42.  For women ages 66-65, health care providers may recommend pelvic exams and Pap testing every 3 years, or they may recommend the Pap and pelvic exam, combined with testing for human papilloma virus (HPV), every 5 years. Some types of HPV increase your risk of cervical cancer. Testing for HPV may also be done on women of any age with unclear Pap test results.  Other health care providers may not recommend any screening for nonpregnant women who are considered low risk for pelvic cancer and who do not have symptoms. Ask your health care provider if a screening pelvic exam is right for you.  If you have had past treatment for cervical cancer or a condition that could lead to cancer, you need Pap tests and screening for cancer for at least 20 years after your treatment. If Pap tests have been discontinued, your risk factors (such as having a new sexual partner) need to be reassessed to determine if screening should resume. Some women have medical problems that increase the chance of getting cervical cancer. In these cases, your health care provider may recommend more frequent screening and Pap tests.  Colorectal Cancer  This type of cancer can be detected and often prevented.  Routine colorectal cancer screening usually begins at 82 years of age and continues through 82 years of age.  Your health care provider may recommend screening at an earlier age if you have risk factors for colon cancer.  Your health care provider may also recommend using home test kits to check for hidden blood in the stool.  A small camera at the end of a tube can be used to examine your colon directly (sigmoidoscopy or colonoscopy).  This is done to check for the earliest forms of colorectal cancer.  Routine screening usually begins at age 44.  Direct examination of the colon should be repeated every 5-10 years through 82 years of age. However, you may need to be screened more often if early forms of precancerous polyps or small growths are found.  Skin Cancer  Check your skin from head to toe regularly.  Tell your health care provider about any new moles or changes in moles, especially if there is a change in a mole's shape or color.  Also tell your health care provider if you have a mole that is larger than the size of a pencil eraser.  Always use sunscreen. Apply sunscreen liberally and repeatedly throughout the day.  Protect yourself by wearing long sleeves, pants, a wide-brimmed hat, and sunglasses whenever you are outside.  Heart disease, diabetes, and high blood pressure  High blood pressure causes heart disease and increases the risk of stroke. High blood pressure is more  likely to develop in: ? People who have blood pressure in the high end of the normal range (130-139/85-89 mm Hg). ? People who are overweight or obese. ? People who are African American.  If you are 36-78 years of age, have your blood pressure checked every 3-5 years. If you are 46 years of age or older, have your blood pressure checked every year. You should have your blood pressure measured twice-once when you are at a hospital or clinic, and once when you are not at a hospital or clinic. Record the average of the two measurements. To check your blood pressure when you are not at a hospital or clinic, you can use: ? An automated blood pressure machine at a pharmacy. ? A home blood pressure monitor.  If you are between 20 years and 57 years old, ask your health care provider if you should take aspirin to prevent strokes.  Have regular diabetes screenings. This involves taking a blood sample to check your fasting blood sugar level. ? If  you are at a normal weight and have a low risk for diabetes, have this test once every three years after 82 years of age. ? If you are overweight and have a high risk for diabetes, consider being tested at a younger age or more often. Preventing infection Hepatitis B  If you have a higher risk for hepatitis B, you should be screened for this virus. You are considered at high risk for hepatitis B if: ? You were born in a country where hepatitis B is common. Ask your health care provider which countries are considered high risk. ? Your parents were born in a high-risk country, and you have not been immunized against hepatitis B (hepatitis B vaccine). ? You have HIV or AIDS. ? You use needles to inject street drugs. ? You live with someone who has hepatitis B. ? You have had sex with someone who has hepatitis B. ? You get hemodialysis treatment. ? You take certain medicines for conditions, including cancer, organ transplantation, and autoimmune conditions.  Hepatitis C  Blood testing is recommended for: ? Everyone born from [birth year] through 1963. ? Anyone with known risk factors for hepatitis C.  Sexually transmitted infections (STIs)  You should be screened for sexually transmitted infections (STIs) including gonorrhea and chlamydia if: ? You are sexually active and are younger than 82 years of age. ? You are older than 82 years of age and your health care provider tells you that you are at risk for this type of infection. ? Your sexual activity has changed since you were last screened and you are at an increased risk for chlamydia or gonorrhea. Ask your health care provider if you are at risk.  If you do not have HIV, but are at risk, it may be recommended that you take a prescription medicine daily to prevent HIV infection. This is called pre-exposure prophylaxis (PrEP). You are considered at risk if: ? You are sexually active and do not regularly use condoms or know the HIV status of your  partner(s). ? You take drugs by injection. ? You are sexually active with a partner who has HIV.  Talk with your health care provider about whether you are at high risk of being infected with HIV. If you choose to begin PrEP, you should first be tested for HIV. You should then be tested every 3 months for as long as you are taking PrEP. Pregnancy  If you are premenopausal and you may  become pregnant, ask your health care provider about preconception counseling.  If you may become pregnant, take 400 to 800 micrograms (mcg) of folic acid every day.  If you want to prevent pregnancy, talk to your health care provider about birth control (contraception). Osteoporosis and menopause  Osteoporosis is a disease in which the bones lose minerals and strength with aging. This can result in serious bone fractures. Your risk for osteoporosis can be identified using a bone density scan.  If you are 40 years of age or older, or if you are at risk for osteoporosis and fractures, ask your health care provider if you should be screened.  Ask your health care provider whether you should take a calcium or vitamin D supplement to lower your risk for osteoporosis.  Menopause may have certain physical symptoms and risks.  Hormone replacement therapy may reduce some of these symptoms and risks. Talk to your health care provider about whether hormone replacement therapy is right for you. Follow these instructions at home:  Schedule regular health, dental, and eye exams.  Stay current with your immunizations.  Do not use any tobacco products including cigarettes, chewing tobacco, or electronic cigarettes.  If you are pregnant, do not drink alcohol.  If you are breastfeeding, limit how much and how often you drink alcohol.  Limit alcohol intake to no more than 1 drink per day for nonpregnant women. One drink equals 12 ounces of beer, 5 ounces of wine, or 1 ounces of hard liquor.  Do not use street  drugs.  Do not share needles.  Ask your health care provider for help if you need support or information about quitting drugs.  Tell your health care provider if you often feel depressed.  Tell your health care provider if you have ever been abused or do not feel safe at home. This information is not intended to replace advice given to you by your health care provider. Make sure you discuss any questions you have with your health care provider. Document Released: 12/28/2010 Document Revised: 11/20/2015 Document Reviewed: 03/18/2015 Elsevier Interactive Patient Education  [employer].

## 2018-04-12 NOTE — Assessment & Plan Note (Signed)
Encounter for general adult medical examination with abnormal findings Reviewed annual screening exams, healthy lifestyle, additional information provided on AVS - Comprehensive metabolic panel; Future - CBC; Future - Lipid panel; Future - Urinalysis, Routine w reflex microscopic; Future  Screening for cholesterol level She is not fasting Will update lipid panel for convenience - Urinalysis, Routine w reflex microscopic; Future  Need for Tdap vaccination- Tdap vaccine greater than or equal to 7yo IM

## 2018-04-12 NOTE — Progress Notes (Signed)
Brenda Hammond is a 84 y.o. female with the following history as recorded in [employer]:  Patient Active Problem List   Diagnosis Date Noted  . Medicare annual wellness visit, subsequent 03/21/2017  . Routine adult health maintenance 03/21/2017  . History of atrial fibrillation 11/26/2015  . Edema 11/26/2015  . Right leg swelling 02/06/2014  . LBP (low back pain) 12/06/2012  . Osteopenia 12/06/2012  . Vitamin D deficiency 12/06/2012  . Malignant neoplasm of colon ([place]) 09/08/2009  . ANEMIA 09/08/2009  . Venous (peripheral) insufficiency 09/08/2009  . GERD 09/08/2009  . UTI 09/08/2009  . Osteoarthritis 09/08/2009  . OTHER DRUG ALLERGY 09/08/2009  . ATRIAL FIBRILLATION 08/21/2009  . Hyperlipidemia 10/30/2008  . Anxiety state 10/30/2008  . Fibromyalgia 10/30/2008  . SCOLIOSIS 10/30/2008  . POLIOMYELITIS, HX OF 10/30/2008    Current Outpatient Medications  Medication Sig Dispense Refill  . acetaminophen (TYLENOL) 325 MG tablet Take 650 mg by mouth every 6 (six) hours as needed.    . cholecalciferol (VITAMIN D) 1000 units tablet Take 1,000 Units by mouth daily.    Marland Kitchen loratadine (CLARITIN) 10 MG tablet Take by mouth.    . Magnesium 100 MG CAPS Take by mouth.    . Multiple Vitamins-Minerals (MULTIPLE VITAMINS/WOMENS PO) Take by mouth.     No current facility-administered medications for this visit.     Allergies: Sulfa antibiotics; Alendronate; Ampicillin; Aspirin; Cyclobenzaprine hcl; Metaxalone; Nitrofurantoin; Nsaids; Penicillins; Sulfonamide derivatives; and Nortriptyline hcl  Past Medical History:  Diagnosis Date  . Anemia   . Anxiety   . Atrial fibrillation ([place])   . Colon adenocarcinoma ([place])   . DJD (degenerative joint disease)   . Fibromyalgia   . GERD (gastroesophageal reflux disease)   . History of poliomyelitis   . History of UTI   . Hyperlipemia   . Other drug allergy(995.27)   . Scoliosis   . Surgical or other procedure not carried out because of patient's  decision   . Venous insufficiency     Past Surgical History:  Procedure Laterality Date  . APPENDECTOMY    . LOW ANTERIOR BOWEL RESECTION  06/2009   with anastomosis for colon cancer  . OTHER SURGICAL HISTORY     T & A  . TOTAL ABDOMINAL HYSTERECTOMY W/ BILATERAL SALPINGOOPHORECTOMY  1980s    Family History  Problem Relation Age of Onset  . Stroke Father   . Cancer Mother        brain  . Colon cancer Neg Hx     Social History   Tobacco Use  . Smoking status: Never Smoker  . Smokeless tobacco: Never Used  Substance Use Topics  . Alcohol use: No     Subjective:  Brenda Hammond is here today requesting CPE, accompanied by husband and daughter  CPE-  Last dental exam: 2019 Last vision exam: 2018 Lipids: lipid panel today Vaccinations: TDAP today Diet and exercise: tries to stay active on daily basis  Review of Systems  Constitutional: Negative for chills and fever.  Eyes: Negative for blurred vision and double vision.  Respiratory: Negative for cough and shortness of breath.   Cardiovascular: Negative for chest pain and palpitations.  Gastrointestinal: Negative for abdominal pain, constipation, diarrhea, heartburn, nausea and vomiting.  Genitourinary: Positive for frequency. Negative for dysuria and hematuria.  Musculoskeletal: Positive for joint pain. Negative for falls.  Skin: Negative for rash.  Neurological: Negative for speech change, loss of consciousness and headaches.  Endo/Heme/Allergies: Does not bruise/bleed easily.  Psychiatric/Behavioral: Negative for  depression. The patient is not nervous/anxious.    Urinary frequency- has noticed more frequent urination at night for several weeks now, does not seem to notice during the day.  Joint pain-reports past hx joint pain, arthritis and fibromyalgia dx in past. She says Her prior PCP was "watching" her autoimmune labs. States she was told in past to try tylenol and exercise for her joint pain, takes tylenol prn and  tries to stay active which does seem to help her joint pain some. Denies erythema, edema. Joints are sometimes tender to touch. She says she really does not like to take medicine or know if she would want any further treatment for this joint pain, but would like to update labs. Her husband is wondering if a specialist could help her    Objective:  Vitals:   04/12/18 1348  BP: 110/68  Pulse: 60  SpO2: 97%  Weight: 116 lb (52.6 kg)  Height: 5\' 1"  (1.549 m)    General: Well developed, well nourished, in no acute distress  Skin : Warm and dry.  Head: Normocephalic and atraumatic  Eyes: Sclera and conjunctiva clear; pupils round and reactive to light; extraocular movements intact   Oropharynx: Pink, supple. No suspicious lesions  Neck: Supple without thyromegaly Lungs: Respirations unlabored; clear to auscultation bilaterally without wheeze, rales, rhonchi   CVS exam: normal rate and regular rhythm, S1 and S2 normal.  Abdomen: Soft; nondistended Musculoskeletal: No deformities, no joint swelling Vessels: Symmetric bilaterally  Neurologic: Alert and oriented; speech intact; face symmetrical; moves all extremities well; CNII-XII intact without focal deficit   Assessment:  1. Encounter for general adult medical examination with abnormal findings   2. Screening for cholesterol level   3. Urinary frequency   4. Arthralgia, unspecified joint   5. Need for Tdap vaccination     Plan:   Return in about 1 year (around 04/13/2019) for CPE.  Orders Placed This Encounter  Procedures  . CULTURE, URINE COMPREHENSIVE    Standing Status:   Future    Standing Expiration Date:   05/13/2018  . Tdap vaccine greater than or equal to 7yo IM  . Comprehensive metabolic panel    Standing Status:   Future    Number of Occurrences:   1    Standing Expiration Date:   04/13/2019  . CBC    Standing Status:   Future    Number of Occurrences:   1    Standing Expiration Date:   04/13/2019  . Lipid panel     Standing Status:   Future    Number of Occurrences:   1    Standing Expiration Date:   04/13/2019  . Urinalysis, Routine w reflex microscopic    Standing Status:   Future    Standing Expiration Date:   04/12/2019  . Sedimentation rate    Standing Status:   Future    Number of Occurrences:   1    Standing Expiration Date:   04/12/2019  . C-reactive protein    Standing Status:   Future    Number of Occurrences:   1    Standing Expiration Date:   04/12/2019  . ANA    Standing Status:   Future    Number of Occurrences:   1    Standing Expiration Date:   04/12/2019    Requested Prescriptions    No prescriptions requested or ordered in this encounter     Urinary frequency F/U with further recommendations pending lab results -  Urinalysis, Routine w reflex microscopic; Future - CULTURE, URINE COMPREHENSIVE; Future  Arthralgia, unspecified joint Update labs F/U with further recommendations pending lab results - Sedimentation rate; Future - C-reactive protein; Future - ANA; Future

## 2018-04-13 LAB — ANA: ANA: NEGATIVE

## 2018-04-14 LAB — CULTURE, URINE COMPREHENSIVE
MICRO NUMBER:: 91244133
RESULT: NO GROWTH
SPECIMEN QUALITY:: ADEQUATE

## 2018-04-17 ENCOUNTER — Other Ambulatory Visit (INDEPENDENT_AMBULATORY_CARE_PROVIDER_SITE_OTHER): Payer: Medicare Other

## 2018-04-17 DIAGNOSIS — R739 Hyperglycemia, unspecified: Secondary | ICD-10-CM

## 2018-04-17 DIAGNOSIS — M2550 Pain in unspecified joint: Secondary | ICD-10-CM | POA: Diagnosis not present

## 2018-04-17 LAB — HEMOGLOBIN A1C: HEMOGLOBIN A1C: 5.9 % (ref 4.6–6.5)

## 2018-04-18 LAB — RHEUMATOID FACTOR: Rhuematoid fact SerPl-aCnc: 26 IU/mL — ABNORMAL HIGH (ref <14)

## 2018-04-19 ENCOUNTER — Other Ambulatory Visit: Payer: Self-pay | Admitting: Family

## 2018-04-19 DIAGNOSIS — R768 Other specified abnormal immunological findings in serum: Secondary | ICD-10-CM

## 2018-04-19 DIAGNOSIS — R7689 Other specified abnormal immunological findings in serum: Secondary | ICD-10-CM

## 2018-07-12 ENCOUNTER — Ambulatory Visit (INDEPENDENT_AMBULATORY_CARE_PROVIDER_SITE_OTHER): Payer: Medicare Other | Admitting: Podiatry

## 2018-07-12 DIAGNOSIS — Q828 Other specified congenital malformations of skin: Secondary | ICD-10-CM

## 2018-07-12 DIAGNOSIS — M79675 Pain in left toe(s): Secondary | ICD-10-CM | POA: Diagnosis not present

## 2018-07-12 DIAGNOSIS — M79674 Pain in right toe(s): Secondary | ICD-10-CM | POA: Diagnosis not present

## 2018-07-12 DIAGNOSIS — B351 Tinea unguium: Secondary | ICD-10-CM

## 2018-07-12 NOTE — Patient Instructions (Signed)

## 2018-07-30 ENCOUNTER — Encounter: Payer: Self-pay | Admitting: Podiatry

## 2018-07-30 NOTE — Progress Notes (Signed)
Subjective:  Patient presents to clinic with cc of  painful callus right foot and painful mycotic toenails which are aggravated when weightbearing with and without shoe gear.  This pain limits her daily activities. Pain symptoms resolve with periodic professional debridement.  She also has h/o fibromyalgia and polio.  Lance Sell, NP is her PCP.   Current Outpatient Medications:  .  acetaminophen (TYLENOL) 325 MG tablet, Take 650 mg by mouth every 6 (six) hours as needed., Disp: , Rfl:  .  cholecalciferol (VITAMIN D) 1000 units tablet, Take 1,000 Units by mouth daily., Disp: , Rfl:  .  loratadine (CLARITIN) 10 MG tablet, Take by mouth., Disp: , Rfl:  .  Magnesium 100 MG CAPS, Take by mouth., Disp: , Rfl:  .  Multiple Vitamins-Minerals (MULTIPLE VITAMINS/WOMENS PO), Take by mouth., Disp: , Rfl:    Allergies  Allergen Reactions  . Sulfa Antibiotics Anaphylaxis    REACTION: elevated HR and BP  . Alendronate Other (See Comments)    "twitching" "twitching"  . Ampicillin     REACTION: rash and high fever  . Aspirin     REACTION: ears ringing  . Cyclobenzaprine Hcl     REACTION: nightmares  . Metaxalone     REACTION: nightmares  . Nitrofurantoin     REACTION: elevated BP and chest pain  . Nsaids     REACTION: rash  . Penicillins     REACTION: rash, high fever  . Sulfonamide Derivatives     REACTION: elevated HR and BP  . Nortriptyline Hcl Rash     Objective:  Physical Examination: Neurovascular status intact with  Muscle strength 5/5 to all muscle groups b/l LE  Porokeratotic lesion noted submet head 2 right foot  Painful, elongated, thick disfigured,  discolored nails with subungual debris noted bilateral entire nail hallux .  Pincer hallux nails  B/L. No erythema, no edema, no drainage.  Assessment: Painful onychomycosis 1-5 b/l Painful porokeratosis submet head 2 right foot  Plan: Painful porokeratotoc lesion pared submet head 2 right foot. Recommend  change in shoe gear from dress flats to Skechers with more cushioning for the sole of her feet. Report any pedal injuries to medical professional immediately Follow up 3 months. Call our office should there be any questions/concerns in the interim.

## 2018-10-11 ENCOUNTER — Ambulatory Visit: Payer: Medicare Other | Admitting: Podiatry

## 2018-10-18 ENCOUNTER — Ambulatory Visit (INDEPENDENT_AMBULATORY_CARE_PROVIDER_SITE_OTHER): Payer: Medicare Other | Admitting: Podiatry

## 2018-10-18 ENCOUNTER — Encounter: Payer: Self-pay | Admitting: Podiatry

## 2018-10-18 ENCOUNTER — Other Ambulatory Visit: Payer: Self-pay

## 2018-10-18 VITALS — Temp 98.1°F

## 2018-10-18 DIAGNOSIS — M79675 Pain in left toe(s): Secondary | ICD-10-CM | POA: Diagnosis not present

## 2018-10-18 DIAGNOSIS — B351 Tinea unguium: Secondary | ICD-10-CM

## 2018-10-18 DIAGNOSIS — M79674 Pain in right toe(s): Secondary | ICD-10-CM

## 2018-10-18 DIAGNOSIS — Q828 Other specified congenital malformations of skin: Secondary | ICD-10-CM | POA: Diagnosis not present

## 2018-10-18 NOTE — Patient Instructions (Addendum)
Onychomycosis/Fungal Toenails  WHAT IS IT? An infection that lies within the keratin of your nail plate that is caused by a fungus.  WHY ME? Fungal infections affect all ages, sexes, races, and creeds.  There may be many factors that predispose you to a fungal infection such as age, coexisting medical conditions such as diabetes, or an autoimmune disease; stress, medications, fatigue, genetics, etc.  Bottom line: fungus thrives in a warm, moist environment and your shoes offer such a location.  IS IT CONTAGIOUS? Theoretically, yes.  You do not want to share shoes, nail clippers or files with someone who has fungal toenails.  Walking around barefoot in the same room or sleeping in the same bed is unlikely to transfer the organism.  It is important to realize, however, that fungus can spread easily from one nail to the next on the same foot.  HOW DO WE TREAT THIS?  There are several ways to treat this condition.  Treatment may depend on many factors such as age, medications, pregnancy, liver and kidney conditions, etc.  It is best to ask your doctor which options are available to you.  1. No treatment.   Unlike many other medical concerns, you can live with this condition.  However for many people this can be a painful condition and may lead to ingrown toenails or a bacterial infection.  It is recommended that you keep the nails cut short to help reduce the amount of fungal nail. 2. Topical treatment.  These range from herbal remedies to prescription strength nail lacquers.  About 40-50% effective, topicals require twice daily application for approximately 9 to 12 months or until an entirely new nail has grown out.  The most effective topicals are medical grade medications available through physicians offices. 3. Oral antifungal medications.  With an 80-90% cure rate, the most common oral medication requires 3 to 4 months of therapy and stays in your system for a year as the new nail grows out.  Oral  antifungal medications do require blood work to make sure it is a safe drug for you.  A liver function panel will be performed prior to starting the medication and after the first month of treatment.  It is important to have the blood work performed to avoid any harmful side effects.  In general, this medication safe but blood work is required. 4. Laser Therapy.  This treatment is performed by applying a specialized laser to the affected nail plate.  This therapy is noninvasive, fast, and non-painful.  It is not covered by insurance and is therefore, out of pocket.  The results have been very good with a 80-95% cure rate.  [facility] is the only practice in the area to offer this therapy. 5. Permanent Nail Avulsion.  Removing the entire nail so that a new nail will not grow back.  Corns and Calluses Corns are small areas of thickened skin that occur on the top, sides, or tip of a toe. They contain a cone-shaped core with a point that can press on a nerve below. This causes pain.  Calluses are areas of thickened skin that can occur anywhere on the body, including the hands, fingers, palms, soles of the feet, and heels. Calluses are usually larger than corns. What are the causes? Corns and calluses are caused by rubbing (friction) or pressure, such as from shoes that are too tight or do not fit properly. What increases the risk? Corns are more likely to develop in people   who have misshapen toes (toe deformities), such as hammer toes. Calluses can occur with friction to any area of the skin. They are more likely to develop in people who:  Work with their hands.  Wear shoes that fit poorly, are too tight, or are high-heeled.  Have toe deformities. What are the signs or symptoms? Symptoms of a corn or callus include:  A hard growth on the skin.  Pain or tenderness under the skin.  Redness and swelling.  Increased discomfort while wearing tight-fitting shoes, if your feet are  affected. If a corn or callus becomes infected, symptoms may include:  Redness and swelling that gets worse.  Pain.  Fluid, blood, or pus draining from the corn or callus. How is this diagnosed? Corns and calluses may be diagnosed based on your symptoms, your medical history, and a physical exam. How is this treated? Treatment for corns and calluses may include:  Removing the cause of the friction or pressure. This may involve: ? Changing your shoes. ? Wearing shoe inserts (orthotics) or other protective layers in your shoes, such as a corn pad. ? Wearing gloves.  Applying medicine to the skin (topical medicine) to help soften skin in the hardened, thickened areas.  Removing layers of dead skin with a file to reduce the size of the corn or callus.  Removing the corn or callus with a scalpel or laser.  Taking antibiotic medicines, if your corn or callus is infected.  Having surgery, if a toe deformity is the cause. Follow these instructions at home:   Take over-the-counter and prescription medicines only as told by your health care provider.  If you were prescribed an antibiotic, take it as told by your health care provider. Do not stop taking it even if your condition starts to improve.  Wear shoes that fit well. Avoid wearing high-heeled shoes and shoes that are too tight or too loose.  Wear any padding, protective layers, gloves, or orthotics as told by your health care provider.  Soak your hands or feet and then use a file or pumice stone to soften your corn or callus. Do this as told by your health care provider.  Check your corn or callus every day for symptoms of infection. Contact a health care provider if you:  Notice that your symptoms do not improve with treatment.  Have redness or swelling that gets worse.  Notice that your corn or callus becomes painful.  Have fluid, blood, or pus coming from your corn or callus.  Have new symptoms. Summary  Corns are  small areas of thickened skin that occur on the top, sides, or tip of a toe.  Calluses are areas of thickened skin that can occur anywhere on the body, including the hands, fingers, palms, and soles of the feet. Calluses are usually larger than corns.  Corns and calluses are caused by rubbing (friction) or pressure, such as from shoes that are too tight or do not fit properly.  Treatment may include wearing any padding, protective layers, gloves, or orthotics as told by your health care provider. This information is not intended to replace advice given to you by your health care provider. Make sure you discuss any questions you have with your health care provider. Document Released: 03/20/2004 Document Revised: 04/27/2017 Document Reviewed: 04/27/2017 Elsevier Interactive Patient Education  2019 Elsevier Inc.  

## 2018-10-25 ENCOUNTER — Encounter: Payer: Self-pay | Admitting: Podiatry

## 2018-10-25 NOTE — Progress Notes (Signed)
Subjective: Brenda Hammond is a 85 y.o. y.o. female who presents today with cc of painful, discolored, thick toenails and painful porokeratosis which interfere with daily activities. Pain is aggravated when wearing enclosed shoe gear and relieved with periodic professional debridement.  Lance Sell, NP is her PCP.   Current Outpatient Medications:  .  acetaminophen (TYLENOL) 325 MG tablet, Take 650 mg by mouth every 6 (six) hours as needed., Disp: , Rfl:  .  cholecalciferol (VITAMIN D) 1000 units tablet, Take 1,000 Units by mouth daily., Disp: , Rfl:  .  loratadine (CLARITIN) 10 MG tablet, Take by mouth., Disp: , Rfl:  .  Magnesium 100 MG CAPS, Take by mouth., Disp: , Rfl:  .  Multiple Vitamins-Minerals (MULTIPLE VITAMINS/WOMENS PO), Take by mouth., Disp: , Rfl:   Allergies  Allergen Reactions  . Sulfa Antibiotics Anaphylaxis    REACTION: elevated HR and BP  . Alendronate Other (See Comments)    "twitching" "twitching"  . Ampicillin     REACTION: rash and high fever  . Aspirin     REACTION: ears ringing  . Cyclobenzaprine Hcl     REACTION: nightmares  . Metaxalone     REACTION: nightmares  . Nitrofurantoin     REACTION: elevated BP and chest pain  . Nsaids     REACTION: rash  . Penicillins     REACTION: rash, high fever  . Sulfonamide Derivatives     REACTION: elevated HR and BP  . Nortriptyline Hcl Rash    Objective:  Vascular Examination: Capillary refill time immediate x 10 digits.  Dorsalis pedis pulses palpable b/l.  Posterior tibial pulses palpable b/l.  Digital hair diminished x 10 digits.  Skin temperature gradient WNL b/l.  Dermatological Examination: Skin with normal turgor, texture and tone b/l.  Toenails 1-5 b/l discolored, thick, dystrophic with subungual debris and pain with palpation to nailbeds due to thickness of nails.  Porokeratotic lesions submet head 2nd right foot with tenderness to palpation. No erythema, no edema, no  drainage, no flocculence.   Musculoskeletal: Muscle strength 5/5 to all LE muscle groups  Neurological: Sensation intact with 10 gram monofilament.  Vibratory sensation intact.  Assessment: 1. Painful onychomycosis toenails 1-5 b/l 2.   Porokeratosis submet head 2nd right foot  Plan: 1. Toenails 1-5 b/l were debrided in length and girth without iatrogenic bleeding. 2. Porokeratosis submet head 2 right foot pared and enucleated with sterile scalpel blade without incident. . 3. Patient to continue soft, supportive shoe gear daily. 4. Patient to report any pedal injuries to medical professional immediately. 5. Follow up 3 months.  6. Patient/POA to call should there be a concern in the interim.

## 2019-01-05 ENCOUNTER — Ambulatory Visit (INDEPENDENT_AMBULATORY_CARE_PROVIDER_SITE_OTHER): Payer: Medicare Other | Admitting: Family Medicine

## 2019-01-05 ENCOUNTER — Encounter: Payer: Self-pay | Admitting: Family Medicine

## 2019-01-05 VITALS — BP 124/64 | HR 69 | Temp 97.8°F | Ht 61.0 in | Wt 115.6 lb

## 2019-01-05 DIAGNOSIS — D649 Anemia, unspecified: Secondary | ICD-10-CM

## 2019-01-05 DIAGNOSIS — R413 Other amnesia: Secondary | ICD-10-CM

## 2019-01-05 DIAGNOSIS — E559 Vitamin D deficiency, unspecified: Secondary | ICD-10-CM | POA: Diagnosis not present

## 2019-01-05 DIAGNOSIS — Z7689 Persons encountering health services in other specified circumstances: Secondary | ICD-10-CM

## 2019-01-05 DIAGNOSIS — R768 Other specified abnormal immunological findings in serum: Secondary | ICD-10-CM

## 2019-01-05 DIAGNOSIS — R2689 Other abnormalities of gait and mobility: Secondary | ICD-10-CM

## 2019-01-05 DIAGNOSIS — M8589 Other specified disorders of bone density and structure, multiple sites: Secondary | ICD-10-CM

## 2019-01-05 NOTE — Progress Notes (Signed)
Brenda Hammond is a 85 y.o. female  Chief Complaint  Patient presents with  . Establish Care    est care/ memory loss     HPI: Brenda Hammond is a 85 y.o. female here to establish care with our office. She is accompanied by her husband, who is also going to establish care, and her daughter Brenda Hammond. She was previously seen at [facility]. As per daughter, pt has struggled with short term and some long term memory loss. Pt misplaces things, repeats herself often. pts personal hygiene has declined - clothes are dirty, hair unkept to the point daughter had to cut it due to tangles/knots. Pt has to have things told to her multiple times. Family members, friends, pastor have all noticed changes in pt.  She worked part-time up until last year.   Specialists: podiatry (Dr. Elisha Ponder), ophthalmology (Dr. Gershon Crane), rheumatology referral placed but pt has not been seen and needs new referral for elevated RF  Last CPE, labs: 03/2018  Last Dexa: 09/2016 (T-score -2.1 = osteopenia) Last colonoscopy: h/o CRC in 2011 (Dr. Dalbert Batman), last colonoscopy in 07/2013 with [facility] Dr. Deatra Ina - f/u in 5 years if pt is agreeable  Med refills needed today: no Rx meds - pt takes PRN tylenol and MVI   Past Medical History:  Diagnosis Date  . Anemia   . Anxiety   . Atrial fibrillation ([place])   . Colon adenocarcinoma ([place])   . DJD (degenerative joint disease)   . Fibromyalgia   . GERD (gastroesophageal reflux disease)   . History of poliomyelitis   . History of UTI   . Hyperlipemia   . Other drug allergy(995.27)   . Scoliosis   . Surgical or other procedure not carried out because of patient's decision   . Venous insufficiency     Past Surgical History:  Procedure Laterality Date  . APPENDECTOMY    . LOW ANTERIOR BOWEL RESECTION  06/2009   with anastomosis for colon cancer  . OTHER SURGICAL HISTORY     T & A  . TOTAL ABDOMINAL HYSTERECTOMY W/ BILATERAL SALPINGOOPHORECTOMY  1980s    Social History    Socioeconomic History  . Marital status: Married    Spouse name: Brenda Hammond  . Number of children: 1  . Years of education: 12  . Highest education level: Not on file  Occupational History  . Occupation: Office manager    Comment: Works part time    Employer: [employer]  . Financial resource strain: Not on file  . Food insecurity    Worry: Not on file    Inability: Not on file  . Transportation needs    Medical: Not on file    Non-medical: Not on file  Tobacco Use  . Smoking status: Never Smoker  . Smokeless tobacco: Never Used  Substance and Sexual Activity  . Alcohol use: No  . Drug use: No  . Sexual activity: Not on file  Lifestyle  . Physical activity    Days per week: Not on file    Minutes per session: Not on file  . Stress: Not on file  Relationships  . Social Herbalist on phone: Not on file    Gets together: Not on file    Attends religious service: Not on file    Active member of club or organization: Not on file    Attends meetings of clubs or organizations: Not on file  Relationship status: Not on file  . Intimate partner violence    Fear of current or ex partner: Not on file    Emotionally abused: Not on file    Physically abused: Not on file    Forced sexual activity: Not on file  Other Topics Concern  . Not on file  Social History Narrative   Originally from [facility] and has lived with her husband in [place] since 1968.   Fun: Exercises regularly, Grandover and have breakfast, walking.   Denies abuse and feels safe at home.        Family History  Problem Relation Age of Onset  . Stroke Father   . Cancer Mother        brain  . Colon cancer Neg Hx      Immunization History  Administered Date(s) Administered  . Tdap 04/12/2018    Outpatient Encounter Medications as of 01/05/2019  Medication Sig  . acetaminophen (TYLENOL) 325 MG tablet Take 650 mg by mouth every 6 (six) hours as needed.  .  cholecalciferol (VITAMIN D) 1000 units tablet Take 1,000 Units by mouth daily.  . Magnesium 100 MG CAPS Take by mouth.  . Multiple Vitamins-Minerals (MULTIPLE VITAMINS/WOMENS PO) Take by mouth.  . loratadine (CLARITIN) 10 MG tablet Take by mouth.   No facility-administered encounter medications on file as of 01/05/2019.      ROS: Gen: no fever, chills  Skin: no rash, itching ENT: no ear pain, ear drainage, nasal congestion, rhinorrhea, sinus pressure, sore throat Eyes: no blurry vision, double vision Resp: no cough, wheeze,SOB CV: no CP, palpitations, LE edema,  GI: no heartburn, n/v/d/c, abd pain GU: no dysuria, urgency, frequency, hematuria  MSK: + joint pains, no myalgias, back pain Neuro: no dizziness, headache, weakness Psych: no depression, anxiety, insomnia; + memory issues   Allergies  Allergen Reactions  . Sulfa Antibiotics Anaphylaxis    REACTION: elevated HR and BP  . Alendronate Other (See Comments)    "twitching" "twitching"  . Ampicillin     REACTION: rash and high fever  . Aspirin     REACTION: ears ringing  . Cyclobenzaprine Hcl     REACTION: nightmares  . Metaxalone     REACTION: nightmares  . Nitrofurantoin     REACTION: elevated BP and chest pain  . Nsaids     REACTION: rash  . Penicillins     REACTION: rash, high fever  . Sulfonamide Derivatives     REACTION: elevated HR and BP  . Nortriptyline Hcl Rash    BP 124/64   Pulse 69   Temp 97.8 F (36.6 C) (Oral)   Ht 5\' 1"  (1.549 m)   Wt 115 lb 9.6 oz (52.4 kg)   SpO2 96%   BMI 21.84 kg/m   Physical Exam  Constitutional: She is oriented to person, place, and time. She appears well-developed and well-nourished. No distress.  Neck: Neck supple. No thyromegaly present.  Cardiovascular: Normal rate and regular rhythm.  Pulmonary/Chest: Effort normal and breath sounds normal.  Abdominal: Soft. She exhibits no distension. There is no abdominal tenderness.  Musculoskeletal:        General: No  edema.  Lymphadenopathy:    She has no cervical adenopathy.  Neurological: She is alert and oriented to person, place, and time.  Psychiatric: She has a normal mood and affect.  Repeats herself at times      A/P:  1. Encounter to establish care with new doctor - next appt in 3 mo (  around 03/2019)  2. Memory changes 3. Balance problem - Ambulatory referral to Neurology - Vitamin B12 - TSH - T4, free - Lipid panel - Basic metabolic panel - AST - ALT  4. Osteopenia of multiple sites 5. Vitamin D deficiency - VITAMIN D 25 Hydroxy (Vit-D Deficiency, Fractures)  6. Anemia, unspecified type - stable with Hgb 12.3, Hct 37.2 in 03/2018  7. Elevated rheumatoid factor - Ambulatory referral to Rheumatology

## 2019-01-06 LAB — ALT: ALT: 9 IU/L (ref 0–32)

## 2019-01-06 LAB — LIPID PANEL
Chol/HDL Ratio: 4.1 ratio (ref 0.0–4.4)
Cholesterol, Total: 210 mg/dL — ABNORMAL HIGH (ref 100–199)
HDL: 51 mg/dL (ref >39)
LDL Calculated: 130 mg/dL — ABNORMAL HIGH (ref 0–99)
Triglycerides: 146 mg/dL (ref 0–149)
VLDL Cholesterol Cal: 29 mg/dL (ref 5–40)

## 2019-01-06 LAB — BASIC METABOLIC PANEL
BUN/Creatinine Ratio: 20 (ref 12–28)
BUN: 16 mg/dL (ref 8–27)
CO2: 22 mmol/L (ref 20–29)
Calcium: 9.1 mg/dL (ref 8.7–10.3)
Chloride: 104 mmol/L (ref 96–106)
Creatinine, Ser: 0.82 mg/dL (ref 0.57–1.00)
GFR calc Af Amer: 77 mL/min/{1.73_m2} (ref >59)
GFR calc non Af Amer: 66 mL/min/{1.73_m2} (ref >59)
Glucose: 81 mg/dL (ref 65–99)
Potassium: 4.5 mmol/L (ref 3.5–5.2)
Sodium: 141 mmol/L (ref 134–144)

## 2019-01-06 LAB — AST: AST: 25 IU/L (ref 0–40)

## 2019-01-06 LAB — TSH: TSH: 1.45 u[IU]/mL (ref 0.450–4.500)

## 2019-01-06 LAB — T4, FREE: Free T4: 1.03 ng/dL (ref 0.82–1.77)

## 2019-01-06 LAB — VITAMIN D 25 HYDROXY (VIT D DEFICIENCY, FRACTURES): Vit D, 25-Hydroxy: 35 ng/mL (ref 30.0–100.0)

## 2019-01-06 LAB — VITAMIN B12: Vitamin B-12: 452 pg/mL (ref 232–1245)

## 2019-01-10 ENCOUNTER — Encounter: Payer: Self-pay | Admitting: Family Medicine

## 2019-02-06 NOTE — Progress Notes (Deleted)
Office Visit Note  Patient: Brenda Hammond             Date of Birth: 1933/01/04           MRN: [id number]             PCP: Ronnald Nian, DO Referring: Ronnald Nian, DO Visit Date: 02/19/2019 Occupation: @GUAROCC @  Subjective:  No chief complaint on file.   History of Present Illness: Brenda Hammond is a 85 y.o. female ***   Activities of Daily Living:  Patient reports morning stiffness for *** {minute/hour:19697}.   Patient {ACTIONS;DENIES/REPORTS:21021675::"Denies"} nocturnal pain.  Difficulty dressing/grooming: {ACTIONS;DENIES/REPORTS:21021675::"Denies"} Difficulty climbing stairs: {ACTIONS;DENIES/REPORTS:21021675::"Denies"} Difficulty getting out of chair: {ACTIONS;DENIES/REPORTS:21021675::"Denies"} Difficulty using hands for taps, buttons, cutlery, and/or writing: {ACTIONS;DENIES/REPORTS:21021675::"Denies"}  No Rheumatology ROS completed.   PMFS History:  Patient Active Problem List   Diagnosis Date Noted  . Medicare annual wellness visit, subsequent 03/21/2017  . Routine adult health maintenance 03/21/2017  . History of atrial fibrillation 11/26/2015  . Edema 11/26/2015  . Right leg swelling 02/06/2014  . LBP (low back pain) 12/06/2012  . Osteopenia 12/06/2012  . Vitamin D deficiency 12/06/2012  . Malignant neoplasm of colon ([place]) 09/08/2009  . ANEMIA 09/08/2009  . Venous (peripheral) insufficiency 09/08/2009  . GERD 09/08/2009  . UTI 09/08/2009  . Osteoarthritis 09/08/2009  . OTHER DRUG ALLERGY 09/08/2009  . ATRIAL FIBRILLATION 08/21/2009  . Hyperlipidemia 10/30/2008  . Anxiety state 10/30/2008  . Fibromyalgia 10/30/2008  . SCOLIOSIS 10/30/2008  . POLIOMYELITIS, HX OF 10/30/2008    Past Medical History:  Diagnosis Date  . Anemia   . Anxiety   . Atrial fibrillation ([place])   . Colon adenocarcinoma ([place])   . DJD (degenerative joint disease)   . Fibromyalgia   . GERD (gastroesophageal reflux disease)   . History of poliomyelitis   .  History of UTI   . Hyperlipemia   . Other drug allergy(995.27)   . Scoliosis   . Surgical or other procedure not carried out because of patient's decision   . Venous insufficiency     Family History  Problem Relation Age of Onset  . Stroke Father   . Cancer Mother        brain  . Colon cancer Neg Hx    Past Surgical History:  Procedure Laterality Date  . APPENDECTOMY    . LOW ANTERIOR BOWEL RESECTION  06/2009   with anastomosis for colon cancer  . OTHER SURGICAL HISTORY     T & A  . TOTAL ABDOMINAL HYSTERECTOMY W/ BILATERAL SALPINGOOPHORECTOMY  1980s   Social History   Social History Narrative   Originally from [place] and has lived with her husband in [place] since 1968.   Fun: Exercises regularly, Grandover and have breakfast, walking.   Denies abuse and feels safe at home.       Immunization History  Administered Date(s) Administered  . Tdap 04/12/2018     Objective: Vital Signs: There were no vitals taken for this visit.   Physical Exam   Musculoskeletal Exam: ***  CDAI Exam: CDAI Score: - Patient Global: -; Provider Global: - Swollen: -; Tender: - Joint Exam   No joint exam has been documented for this visit   There is currently no information documented on the homunculus. Go to the Rheumatology activity and complete the homunculus joint exam.  Investigation: No additional findings.  Imaging: No results found.  Recent Labs: Lab Results  Component Value Date   WBC 7.7 04/12/2018  HGB 12.3 04/12/2018   PLT 240.0 04/12/2018   NA 141 01/05/2019   K 4.5 01/05/2019   CL 104 01/05/2019   CO2 22 01/05/2019   GLUCOSE 81 01/05/2019   BUN 16 01/05/2019   CREATININE 0.82 01/05/2019   BILITOT 0.3 04/12/2018   ALKPHOS 86 04/12/2018   AST 25 01/05/2019   ALT 9 01/05/2019   PROT 7.0 04/12/2018   ALBUMIN 4.1 04/12/2018   CALCIUM 9.1 01/05/2019   GFRAA 77 01/05/2019    Speciality Comments: No specialty comments available.  Procedures:  No  procedures performed Allergies: Sulfa antibiotics, Alendronate, Ampicillin, Aspirin, Cyclobenzaprine hcl, Metaxalone, Nitrofurantoin, Nsaids, Penicillins, Sulfonamide derivatives, and Nortriptyline hcl   Assessment / Plan:     Visit Diagnoses: No diagnosis found.  Orders: No orders of the defined types were placed in this encounter.  No orders of the defined types were placed in this encounter.   Face-to-face time spent with patient was *** minutes. Greater than 50% of time was spent in counseling and coordination of care.  Follow-Up Instructions: No follow-ups on file.   Ofilia Neas, PA-C  Note - This record has been created using Dragon software.  Chart creation errors have been sought, but may not always  have been located. Such creation errors do not reflect on  the standard of medical care.

## 2019-02-14 ENCOUNTER — Other Ambulatory Visit: Payer: Self-pay

## 2019-02-14 ENCOUNTER — Ambulatory Visit: Payer: Medicare Other | Admitting: Podiatry

## 2019-02-14 ENCOUNTER — Encounter: Payer: Self-pay | Admitting: Podiatry

## 2019-02-14 DIAGNOSIS — M79675 Pain in left toe(s): Secondary | ICD-10-CM | POA: Diagnosis not present

## 2019-02-14 DIAGNOSIS — B351 Tinea unguium: Secondary | ICD-10-CM | POA: Diagnosis not present

## 2019-02-14 DIAGNOSIS — M79674 Pain in right toe(s): Secondary | ICD-10-CM

## 2019-02-14 DIAGNOSIS — Q828 Other specified congenital malformations of skin: Secondary | ICD-10-CM | POA: Diagnosis not present

## 2019-02-14 DIAGNOSIS — M79671 Pain in right foot: Secondary | ICD-10-CM | POA: Diagnosis not present

## 2019-02-14 NOTE — Patient Instructions (Signed)

## 2019-02-19 ENCOUNTER — Ambulatory Visit: Payer: Medicare Other | Admitting: Rheumatology

## 2019-02-26 NOTE — Progress Notes (Signed)
Subjective:  Brenda Hammond presents to clinic today with cc of  painful, thick, discolored, elongated toenails 1-5 b/l that become tender and cannot cut because of thickness.  Patient also has painful porokeratosis of the right foot.  Pain is aggravated when weightbearing and wearing enclosed shoe gear.  She voices no new pedal problems on today's visit.   Current Outpatient Medications:  .  acetaminophen (TYLENOL) 325 MG tablet, Take 650 mg by mouth every 6 (six) hours as needed., Disp: , Rfl:  .  cholecalciferol (VITAMIN D) 1000 units tablet, Take 1,000 Units by mouth daily., Disp: , Rfl:  .  loratadine (CLARITIN) 10 MG tablet, Take by mouth., Disp: , Rfl:  .  Magnesium 100 MG CAPS, Take by mouth., Disp: , Rfl:  .  Multiple Vitamins-Minerals (MULTIPLE VITAMINS/WOMENS PO), Take by mouth., Disp: , Rfl:    Allergies  Allergen Reactions  . Sulfa Antibiotics Anaphylaxis    REACTION: elevated HR and BP  . Alendronate Other (See Comments)    "twitching" "twitching"  . Ampicillin     REACTION: rash and high fever  . Aspirin     REACTION: ears ringing  . Cyclobenzaprine Hcl     REACTION: nightmares  . Metaxalone     REACTION: nightmares  . Nitrofurantoin     REACTION: elevated BP and chest pain  . Nsaids     REACTION: rash  . Penicillins     REACTION: rash, high fever  . Sulfonamide Derivatives     REACTION: elevated HR and BP  . Nortriptyline Hcl Rash     Objective: There were no vitals filed for this visit.  Physical Examination:  Vascular Examination: Capillary refill time immediate x 10 digits.  Palpable DP/PT pulses b/l.  Digital hair diminished bilaterally. No edema noted b/l.  Skin temperature gradient WNL b/l.  Dermatological Examination: Skin with normal turgor, texture and tone b/l.  No open wounds b/l.  No interdigital macerations noted b/l.  Elongated, thick, discolored brittle toenails with subungual debris and pain on dorsal palpation of  nailbeds 1-5 b/l.  Porokeratotic lesion noted submetatarsal head to the right foot.  There is tenderness to palpation.  There is no erythema, no edema, no drainage, no flocculence noted.  Musculoskeletal Examination: Muscle strength 5/5 to all muscle groups b/l  No pain, crepitus or joint discomfort with active/passive ROM.  Neurological Examination: Sensation intact 5/5 b/l with 10 gram monofilament.  Vibratory sensation intact b/l.  Proprioceptive sensation intact b/l.  Assessment: Mycotic nail infection with pain 1-5 b/l Painful porokeratotic lesion submetatarsal heads secondary foot  Plan: 1. Toenails 1-5 b/l were debrided in length and girth without iatrogenic laceration.   2. Porokeratosis submetatarsal head to right foot pared and enucleated with sterile scalpel blade without incident. 3. Continue soft, supportive shoe gear daily. 4. Report any pedal injuries to medical professional. 5. Follow up 3 months. 6. Patient/POA to call should there be a question/concern in there interim.

## 2019-03-01 ENCOUNTER — Other Ambulatory Visit: Payer: Self-pay

## 2019-03-01 ENCOUNTER — Encounter: Payer: Self-pay | Admitting: Neurology

## 2019-03-01 ENCOUNTER — Ambulatory Visit (INDEPENDENT_AMBULATORY_CARE_PROVIDER_SITE_OTHER): Payer: Medicare Other | Admitting: Neurology

## 2019-03-01 DIAGNOSIS — R413 Other amnesia: Secondary | ICD-10-CM | POA: Diagnosis not present

## 2019-03-01 HISTORY — DX: Other amnesia: R41.3

## 2019-03-01 NOTE — Progress Notes (Signed)
Reason for visit: Memory disturbance  Referring physician: Dr. Victoriano Lain is a 85 y.o. female  History of present illness:  Ms. Casablanca is an 85 year old right-handed white female with a history of a memory disturbance that has been noted over the last 6 to 8 months.  The patient is living with her husband, but he has recently had some issues with a fractured hip, and she is having to take care of him.  The patient comes in with her daughter.  The daughter has noticed that her mother is wearing dirty clothes, she does not take care of the house as well as she used to.  She will call her daughter over certain bills, she does not completely understand them.  She otherwise does pay the bills.  She has had jewelry in the house that she does not remember anything about.  She may not recognize some of if she has not seen them in a while.  She currently does not cook, she is mainly heating up things and eating them.  She has had issues with chronic insomnia, this issue has continued.  The patient manages her own medications, the daughter helps her with her appointments.  The patient does not operate a motor vehicle.  The patient reports some occasional left-sided sciatica pain, she may have some leg cramps at times.  She denies any weakness of extremities or difficulty controlling the bowels or the bladder.  The patient denies any headaches, dizziness, or vision changes.  The patient is sent to the office today for the underlying memory issues.  Past Medical History:  Diagnosis Date  . Anemia   . Anxiety   . Atrial fibrillation ([place])   . Colon adenocarcinoma ([place])   . DJD (degenerative joint disease)   . Fibromyalgia   . GERD (gastroesophageal reflux disease)   . History of poliomyelitis   . History of UTI   . Hyperlipemia   . Other drug allergy(995.27)   . Scoliosis   . Surgical or other procedure not carried out because of patient's decision   . Venous insufficiency      Past Surgical History:  Procedure Laterality Date  . APPENDECTOMY    . LOW ANTERIOR BOWEL RESECTION  06/2009   with anastomosis for colon cancer  . OTHER SURGICAL HISTORY     T & A  . TOTAL ABDOMINAL HYSTERECTOMY W/ BILATERAL SALPINGOOPHORECTOMY  1980s    Family History  Problem Relation Age of Onset  . Stroke Father   . Cancer Mother        brain  . Colon cancer Neg Hx     Social history:  reports that she has never smoked. She has never used smokeless tobacco. She reports that she does not drink alcohol or use drugs.  Medications:  Prior to Admission medications   Medication Sig Start Date End Date Taking? Authorizing Provider  acetaminophen (TYLENOL) 325 MG tablet Take 650 mg by mouth. 1 per day   Yes [provider]  cholecalciferol (VITAMIN D) 1000 units tablet Take 1,000 Units by mouth daily.   Yes [provider]  loratadine (CLARITIN) 10 MG tablet Take by mouth as needed.    Yes [provider]  Magnesium 100 MG CAPS Take by mouth.   Yes [provider]  Multiple Vitamins-Minerals (MULTIPLE VITAMINS/WOMENS PO) Take by mouth.   Yes [provider]      Allergies  Allergen Reactions  . Sulfa Antibiotics Anaphylaxis  REACTION: elevated HR and BP  . Alendronate Other (See Comments)    "twitching" "twitching"  . Ampicillin     REACTION: rash and high fever  . Aspirin     REACTION: ears ringing  . Cyclobenzaprine Hcl     REACTION: nightmares  . Metaxalone     REACTION: nightmares  . Nitrofurantoin     REACTION: elevated BP and chest pain  . Nsaids     REACTION: rash  . Penicillins     REACTION: rash, high fever  . Sulfonamide Derivatives     REACTION: elevated HR and BP  . Nortriptyline Hcl Rash    ROS:  Out of a complete 14 system review of symptoms, the patient complains only of the following symptoms, and all other reviewed systems are negative.  Memory problems Left leg pain Walking difficulty   Blood pressure 124/62, pulse 68, temperature 98.4 F (36.9 C), temperature source Temporal, height 5\' 1"  (1.549 m), weight 113 lb 6 oz (51.4 kg), SpO2 98 %.  Physical Exam  General: The patient is alert and cooperative at the time of the examination.  Eyes: Pupils are equal, round, and reactive to light. Discs are flat bilaterally.  Neck: The neck is supple, no carotid bruits are noted.  Respiratory: The respiratory examination is clear.  Cardiovascular: The cardiovascular examination reveals a regular rate and rhythm, no obvious murmurs or rubs are noted.  Skin: Extremities are without significant edema.  Neurologic Exam  Mental status: The patient is alert and oriented x 3 at the time of the examination. The Mini-Mental status examination done today shows a total score of 29/30.  Cranial nerves: Facial symmetry is present. There is good sensation of the face to pinprick and soft touch bilaterally. The strength of the facial muscles and the muscles to head turning and shoulder shrug are normal bilaterally. Speech is well enunciated, no aphasia or dysarthria is noted. Extraocular movements are full. Visual fields are full. The tongue is midline, and the patient has symmetric elevation of the soft palate. No obvious hearing deficits are noted.  Motor: The motor testing reveals 5 over 5 strength of all 4 extremities. Good symmetric motor tone is noted throughout.  Sensory: Sensory testing is intact to pinprick, soft touch, vibration sensation, and position sense on all 4 extremities. No evidence of extinction is noted.  Coordination: Cerebellar testing reveals good finger-nose-finger and heel-to-shin bilaterally.  Gait and station: Gait is slightly wide-based, unsteady, the patient uses a cane. Tandem gait was not tested. Romberg is negative. No drift is seen.  Reflexes: Deep tendon reflexes are symmetric and normal bilaterally. Toes are downgoing bilaterally.   Assessment/Plan:   1.  Mild memory disturbance  2.  Gait disturbance  The patient will be sent for CT scan of the brain.  She has already had some blood work that includes a thyroid profile and a B12 level that were unremarkable.  We discussed the potential for starting a medication for memory such as Aricept or Namenda, the patient may be amenable to this.  The patient will follow-up in 6 months.  We will decide about the medication for memory after the CT is done.  Jill Alexanders MD 03/01/2019 11:40 AM  [facility] [address] [place], [place] [address]  Phone [phone] Fax [phone]

## 2019-03-06 ENCOUNTER — Telehealth: Payer: Self-pay | Admitting: Neurology

## 2019-03-06 NOTE — Telephone Encounter (Signed)
[employer] medicare order sent to GI. No auth they will reach out to the patient to schedule.  

## 2019-03-15 ENCOUNTER — Telehealth: Payer: Self-pay | Admitting: Neurology

## 2019-03-15 ENCOUNTER — Ambulatory Visit
Admission: RE | Admit: 2019-03-15 | Discharge: 2019-03-15 | Disposition: A | Discharge status: Home / Self Care | Payer: Medicare Other | Source: Ambulatory Visit | Attending: Neurology | Admitting: Neurology

## 2019-03-15 ENCOUNTER — Other Ambulatory Visit: Payer: Self-pay

## 2019-03-15 DIAGNOSIS — R413 Other amnesia: Secondary | ICD-10-CM | POA: Diagnosis not present

## 2019-03-15 NOTE — Telephone Encounter (Signed)
CT scan of the head does show some mild cortical atrophy, no significant small vessel ischemic changes.  If the patient desires to go on medication for memory, she is to contact our office.   CT head 03/15/19:  IMPRESSION: This CT scan of the head without contrast shows the following: 1.   Generalized cortical atrophy that is most pronounced in the right temporal lobe and the posterior frontal lobes.  This could be seen with frontotemporal dementia or other dementias. 2.   Mild chronic microvascular ischemic changes. 3.   No acute findings.

## 2019-03-19 ENCOUNTER — Ambulatory Visit: Payer: Medicare Other | Admitting: Rheumatology

## 2019-03-20 ENCOUNTER — Telehealth: Payer: Self-pay | Admitting: Neurology

## 2019-03-20 NOTE — Telephone Encounter (Signed)
Pt's daughter [name] called. Please call back when available. Pt will be at [phone] until 6:30pm today.

## 2019-03-20 NOTE — Telephone Encounter (Signed)
error 

## 2019-03-21 MED ORDER — DONEPEZIL HCL 5 MG PO TABS: 5.0000 mg | ORAL_TABLET | Freq: Every day | ORAL | 1 refills | Status: DC

## 2019-03-21 NOTE — Telephone Encounter (Signed)
I called the daughter.  The CT of the head showed cortical atrophy that was not severe.  If the patient desires to go on medication for memory, we can get something started.  It is possible the patient may have early dementia such as Alzheimer's disease, but she is still scoring quite well with the Mini-Mental status examination.  If she desires to get involved with research, I will try to get this set up.  The daughter wishes to get the patient started on something for memory, we will start Aricept.

## 2019-03-21 NOTE — Addendum Note (Signed)
Addended by: Kathrynn Ducking on: 03/21/2019 09:10 AM   Modules accepted: Orders

## 2019-04-03 ENCOUNTER — Other Ambulatory Visit: Payer: Self-pay

## 2019-04-03 ENCOUNTER — Ambulatory Visit: Payer: Medicare Other | Admitting: Orthotics

## 2019-04-03 DIAGNOSIS — M79675 Pain in left toe(s): Secondary | ICD-10-CM

## 2019-04-03 DIAGNOSIS — B351 Tinea unguium: Secondary | ICD-10-CM

## 2019-04-03 DIAGNOSIS — Q828 Other specified congenital malformations of skin: Secondary | ICD-10-CM

## 2019-04-03 DIAGNOSIS — M79671 Pain in right foot: Secondary | ICD-10-CM

## 2019-04-03 NOTE — Progress Notes (Signed)
Ordered size 8 shoes to accomodate high arch.

## 2019-04-06 ENCOUNTER — Telehealth: Payer: Self-pay | Admitting: Neurology

## 2019-04-06 NOTE — Telephone Encounter (Signed)
Pt daughter has called to report that pt passed out yesterday which daughter is aware this is a side affect to the donepezil (ARICEPT) 5 MG tablet.  Daughter asking if Dr Jannifer Franklin wants pt to continue on this medication or make a change to it

## 2019-04-06 NOTE — Telephone Encounter (Signed)
I called and left a message for the daughter.  Aricept can lower the heart rate, it can sometimes result in syncope because of this but this is relatively rare.  I would stop the Aricept now, have the patient evaluated through the primary care physician initially, make sure there is no cardiac issue that could have resulted in the blackout.  We could consider the use of Namenda in the future.

## 2019-04-11 ENCOUNTER — Other Ambulatory Visit: Payer: Self-pay | Admitting: Neurology

## 2019-04-11 MED ORDER — MEMANTINE HCL 5 MG PO TABS: ORAL_TABLET | ORAL | 0 refills | Status: DC

## 2019-04-11 NOTE — Progress Notes (Signed)
The patient recently had a near syncopal event, she was on Aricept, she began having severe muscle cramps in both hands and then became dizzy, lightheaded, diaphoretic.  She did not completely pass out.  This likely represented a vasovagal syncopal event associated with the pain from the muscle cramps in her hands.  We will stop the Aricept and start Namenda at this time.

## 2019-04-12 ENCOUNTER — Other Ambulatory Visit: Payer: Self-pay

## 2019-04-12 ENCOUNTER — Encounter: Payer: Self-pay | Admitting: Family Medicine

## 2019-04-12 ENCOUNTER — Ambulatory Visit (INDEPENDENT_AMBULATORY_CARE_PROVIDER_SITE_OTHER): Payer: Medicare Other | Admitting: Family Medicine

## 2019-04-12 VITALS — BP 138/80 | HR 66 | Temp 97.6°F | Ht 61.0 in | Wt 115.4 lb

## 2019-04-12 DIAGNOSIS — E559 Vitamin D deficiency, unspecified: Secondary | ICD-10-CM | POA: Diagnosis not present

## 2019-04-12 DIAGNOSIS — Z0000 Encounter for general adult medical examination without abnormal findings: Secondary | ICD-10-CM | POA: Diagnosis not present

## 2019-04-12 DIAGNOSIS — D649 Anemia, unspecified: Secondary | ICD-10-CM | POA: Diagnosis not present

## 2019-04-12 DIAGNOSIS — R55 Syncope and collapse: Secondary | ICD-10-CM | POA: Diagnosis not present

## 2019-04-12 DIAGNOSIS — M8589 Other specified disorders of bone density and structure, multiple sites: Secondary | ICD-10-CM

## 2019-04-12 DIAGNOSIS — E782 Mixed hyperlipidemia: Secondary | ICD-10-CM

## 2019-04-12 DIAGNOSIS — Z2821 Immunization not carried out because of patient refusal: Secondary | ICD-10-CM

## 2019-04-12 LAB — COMPREHENSIVE METABOLIC PANEL
ALT: 14 U/L (ref 0–35)
AST: 25 U/L (ref 0–37)
Albumin: 3.9 g/dL (ref 3.5–5.2)
Alkaline Phosphatase: 94 U/L (ref 39–117)
BUN: 12 mg/dL (ref 6–23)
CO2: 28 mEq/L (ref 19–32)
Calcium: 9.2 mg/dL (ref 8.4–10.5)
Chloride: 104 mEq/L (ref 96–112)
Creatinine, Ser: 0.77 mg/dL (ref 0.40–1.20)
GFR: 71.39 mL/min (ref >60.00)
Glucose, Bld: 85 mg/dL (ref 70–99)
Potassium: 4.5 mEq/L (ref 3.5–5.1)
Sodium: 140 mEq/L (ref 135–145)
Total Bilirubin: 0.4 mg/dL (ref 0.2–1.2)
Total Protein: 6.8 g/dL (ref 6.0–8.3)

## 2019-04-12 LAB — CBC
HCT: 37 % (ref 36.0–46.0)
Hemoglobin: 12.2 g/dL (ref 12.0–15.0)
MCHC: 32.9 g/dL (ref 30.0–36.0)
MCV: 88.5 fl (ref 78.0–100.0)
Platelets: 240 10*3/uL (ref 150.0–400.0)
RBC: 4.18 Mil/uL (ref 3.87–5.11)
RDW: 14.7 % (ref 11.5–15.5)
WBC: 7.6 10*3/uL (ref 4.0–10.5)

## 2019-04-12 LAB — LIPID PANEL
Cholesterol: 217 mg/dL — ABNORMAL HIGH (ref 0–200)
HDL: 68.3 mg/dL (ref >39.00)
LDL Cholesterol: 120 mg/dL — ABNORMAL HIGH (ref 0–99)
NonHDL: 148.64
Total CHOL/HDL Ratio: 3
Triglycerides: 145 mg/dL (ref 0.0–149.0)
VLDL: 29 mg/dL (ref 0.0–40.0)

## 2019-04-12 LAB — VITAMIN D 25 HYDROXY (VIT D DEFICIENCY, FRACTURES): VITD: 40.18 ng/mL (ref 30.00–100.00)

## 2019-04-12 NOTE — Progress Notes (Signed)
Brenda Hammond is a 85 y.o. female  Chief Complaint  Patient presents with  . Annual Exam    Physical. Pt fasted.    HPI: Brenda Hammond is a 85 y.o. female who is here with her husband and daughter, [name] for annual exam, fasting labs, and follow-up on chronic medial issues including anemia, anxiety, hyperlipidemia, cognitive decline.  Since last OV with me, pt was seen by neuro Dr. Margette Fast. She was started on aricept 5mg  daily. Medication was stopped on 04/06/19 at recommendation of Dr. Jannifer Franklin after pt had a pre-syncopal episode on 04/05/19. BP 100/70, HR in 60's at time of episode. Pt states she was in the bathroom getting ready for the day when her hands started to hurt very badly and "completely locked up". As the pain persisted, pt states she became diaphoretic and felt weak, lightheaded. She has to sit down at a dining room chair and then SIL carried her to the sofa. It took her about 61min to feel better.  She declines the flu vaccine today.  Last mammo: 2010 Last Dexa: 09/2016 Last colonoscopy: 07/2013 (Dr. Deatra Ina [facility]) - pt could f/u in 2020 but GI note states that can be left up to patient and PCP due to age >24yo.  Med refills needed today? none  Pt denies fever, chills, CP, SOB, n/v/d/c, LE edema, HA, numbness or paresthesias.  Past Medical History:  Diagnosis Date  . Anemia   . Anxiety   . Atrial fibrillation ([place])   . Colon adenocarcinoma ([place])   . DJD (degenerative joint disease)   . Fibromyalgia   . GERD (gastroesophageal reflux disease)   . History of poliomyelitis   . History of UTI   . Hyperlipemia   . Memory disorder 03/01/2019  . Other drug allergy(995.27)   . Scoliosis   . Surgical or other procedure not carried out because of patient's decision   . Venous insufficiency     Past Surgical History:  Procedure Laterality Date  . APPENDECTOMY    . LOW ANTERIOR BOWEL RESECTION  06/2009   with anastomosis for colon cancer  . OTHER SURGICAL  HISTORY     T & A  . TOTAL ABDOMINAL HYSTERECTOMY W/ BILATERAL SALPINGOOPHORECTOMY  1980s    Social History   Socioeconomic History  . Marital status: Married    Spouse name: Latrista Smolenski  . Number of children: 1  . Years of education: 49  . Highest education level: Not on file  Occupational History  . Occupation: Office manager    Comment: Works part time    Employer: [employer]  . Financial resource strain: Not on file  . Food insecurity    Worry: Not on file    Inability: Not on file  . Transportation needs    Medical: Not on file    Non-medical: Not on file  Tobacco Use  . Smoking status: Never Smoker  . Smokeless tobacco: Never Used  Substance and Sexual Activity  . Alcohol use: No  . Drug use: No  . Sexual activity: Not on file  Lifestyle  . Physical activity    Days per week: Not on file    Minutes per session: Not on file  . Stress: Not on file  Relationships  . Social Herbalist on phone: Not on file    Gets together: Not on file    Attends religious service: Not on file    Active  member of club or organization: Not on file    Attends meetings of clubs or organizations: Not on file    Relationship status: Not on file  . Intimate partner violence    Fear of current or ex partner: Not on file    Emotionally abused: Not on file    Physically abused: Not on file    Forced sexual activity: Not on file  Other Topics Concern  . Not on file  Social History Narrative   Originally from [place] and has lived with her husband in [place] since 1968.   Fun: Exercises regularly, [place] and have breakfast, walking.   Denies abuse and feels safe at home.       Right handed    Caffeine ~ 3 cups per day    Living with his daughter     Family History  Problem Relation Age of Onset  . Stroke Father   . Cancer Mother        brain  . Colon cancer Neg Hx      Immunization History  Administered Date(s) Administered  .  Tdap 04/12/2018    Outpatient Encounter Medications as of 04/12/2019  Medication Sig  . acetaminophen (TYLENOL) 325 MG tablet Take 650 mg by mouth. 1 per day  . cholecalciferol (VITAMIN D) 1000 units tablet Take 1,000 Units by mouth daily.  Marland Kitchen loratadine (CLARITIN) 10 MG tablet Take by mouth as needed.   . Magnesium 100 MG CAPS Take by mouth.  . memantine (NAMENDA) 5 MG tablet Take 1 tablet daily for one week, then take 1 tablet twice daily for one week, then take 1 tablet in the morning and 2 in the evening for one week, then take 2 tablets twice daily  . Multiple Vitamins-Minerals (MULTIPLE VITAMINS/WOMENS PO) Take by mouth.   No facility-administered encounter medications on file as of 04/12/2019.      ROS: Pertinent positives and negatives noted in HPI. Remainder of ROS non-contributory    Allergies  Allergen Reactions  . Sulfa Antibiotics Anaphylaxis    REACTION: elevated HR and BP  . Alendronate Other (See Comments)    "twitching" "twitching"  . Ampicillin     REACTION: rash and high fever  . Aspirin     REACTION: ears ringing  . Cyclobenzaprine Hcl     REACTION: nightmares  . Metaxalone     REACTION: nightmares  . Nitrofurantoin     REACTION: elevated BP and chest pain  . Nsaids     REACTION: rash  . Penicillins     REACTION: rash, high fever  . Sulfonamide Derivatives     REACTION: elevated HR and BP  . Nortriptyline Hcl Rash    BP 138/80   Pulse 66   Temp 97.6 F (36.4 C) (Oral)   Ht 5\' 1"  (1.549 m)   Wt 115 lb 6.4 oz (52.3 kg)   SpO2 99%   BMI 21.80 kg/m   BP Readings from Last 3 Encounters:  04/12/19 138/80  03/01/19 124/62  01/05/19 124/64   Pulse Readings from Last 3 Encounters:  04/12/19 66  03/01/19 68  01/05/19 69   Wt Readings from Last 3 Encounters:  04/12/19 115 lb 6.4 oz (52.3 kg)  03/01/19 113 lb 6 oz (51.4 kg)  01/05/19 115 lb 9.6 oz (52.4 kg)    Physical Exam  Constitutional: She is oriented to person, place, and time. She  appears well-developed and well-nourished. No distress.  Neck: Neck supple. No JVD present. No thyromegaly  present.  Cardiovascular: Normal rate, regular rhythm and intact distal pulses.  Pulmonary/Chest: Effort normal and breath sounds normal. No respiratory distress.  Abdominal: Soft. Bowel sounds are normal. She exhibits no distension and no mass. There is no abdominal tenderness.  Musculoskeletal:        General: No edema.  Lymphadenopathy:    She has no cervical adenopathy.  Neurological: She is alert and oriented to person, place, and time. She exhibits normal muscle tone. Coordination normal.  Skin: Skin is warm and dry.  Psychiatric: She has a normal mood and affect. Her behavior is normal.     A/P:  1. Annual physical exam - pt declines further CRC screening  2. Anemia, unspecified type - CBC  3. Vitamin D deficiency - VITAMIN D 25 Hydroxy (Vit-D Deficiency, Fractures) - last Dexa in 09/2016 - pt declines additional screening  4. Osteopenia of multiple sites - Comprehensive metabolic panel - last Dexa in 09/2016 - pt declines additional screening  5. Mixed hyperlipidemia - Lipid panel  6. Near syncope - could have been secondary to pain, vagal response or that in combo with aricept, but I feel it is important to evaluate further. Pt feels she is fine but is agreeable to EKG in office today and carotid US - EKG 12-Lead - NSR at 61bpm - US Carotid Duplex Bilateral; Future - increase water intake

## 2019-04-16 ENCOUNTER — Telehealth: Payer: Self-pay | Admitting: Family Medicine

## 2019-04-16 DIAGNOSIS — F0390 Unspecified dementia without behavioral disturbance: Secondary | ICD-10-CM

## 2019-04-16 DIAGNOSIS — R413 Other amnesia: Secondary | ICD-10-CM

## 2019-04-16 NOTE — Telephone Encounter (Signed)
MC-Pt daughter requesting ST for cognitive issues/recent Dx of Dementia/would like the order to be placed with [facility]/plz advise/thx dmf

## 2019-04-16 NOTE — Telephone Encounter (Signed)
Patient's daughter, Oneal Grout- is calling to request if her mother can get orders placed for speech therapy for cognitive issues. Her mother was recently diagnosed with dementia. And she was told that the speech therapy could help.[name] is requesting if that can be placed with [facility].  Please advise with [name] as Ms. Emison is staying with [facility].  Suzzette [phone]

## 2019-04-18 NOTE — Telephone Encounter (Signed)
Referral placed.

## 2019-04-19 ENCOUNTER — Other Ambulatory Visit: Payer: Medicare Other

## 2019-04-19 ENCOUNTER — Ambulatory Visit
Admission: RE | Admit: 2019-04-19 | Discharge: 2019-04-19 | Disposition: A | Discharge status: Home / Self Care | Payer: Medicare Other | Source: Ambulatory Visit | Attending: Family Medicine | Admitting: Family Medicine

## 2019-04-19 DIAGNOSIS — R55 Syncope and collapse: Secondary | ICD-10-CM

## 2019-04-19 DIAGNOSIS — I6523 Occlusion and stenosis of bilateral carotid arteries: Secondary | ICD-10-CM | POA: Diagnosis not present

## 2019-04-25 ENCOUNTER — Other Ambulatory Visit: Payer: Self-pay

## 2019-04-25 ENCOUNTER — Ambulatory Visit: Payer: Medicare Other | Admitting: Orthotics

## 2019-04-25 DIAGNOSIS — M79671 Pain in right foot: Secondary | ICD-10-CM

## 2019-04-25 DIAGNOSIS — B351 Tinea unguium: Secondary | ICD-10-CM

## 2019-04-25 DIAGNOSIS — M79674 Pain in right toe(s): Secondary | ICD-10-CM

## 2019-04-25 NOTE — Progress Notes (Signed)
Patient tryign to decide on shoes.

## 2019-05-01 ENCOUNTER — Telehealth: Payer: Self-pay

## 2019-05-01 NOTE — Telephone Encounter (Signed)
Copied from [place] [id number]. Topic: General - Other >> May 01, 2019  4:17 PM Leward Quan A wrote: Reason for CRM: [facility] called to say that patient was referred to them for speech therapy but patient is out daily with her husband. She was referred to [facility] as an out patient rehab center Ph# [phone] and Fax# [phone] wanted to inform Dr C.

## 2019-05-21 ENCOUNTER — Ambulatory Visit: Payer: Medicare Other | Admitting: Orthotics

## 2019-05-21 ENCOUNTER — Other Ambulatory Visit: Payer: Self-pay

## 2019-05-21 DIAGNOSIS — M79671 Pain in right foot: Secondary | ICD-10-CM

## 2019-05-21 DIAGNOSIS — Q828 Other specified congenital malformations of skin: Secondary | ICD-10-CM

## 2019-05-21 DIAGNOSIS — B351 Tinea unguium: Secondary | ICD-10-CM

## 2019-05-21 NOTE — Progress Notes (Signed)
Added mole skin to shoe to help rubbing.

## 2019-06-06 ENCOUNTER — Other Ambulatory Visit: Payer: Self-pay | Admitting: Neurology

## 2019-07-06 ENCOUNTER — Other Ambulatory Visit: Payer: Self-pay

## 2019-07-06 ENCOUNTER — Ambulatory Visit: Payer: Medicare Other | Admitting: Podiatry

## 2019-07-06 ENCOUNTER — Encounter: Payer: Self-pay | Admitting: Podiatry

## 2019-07-06 DIAGNOSIS — B351 Tinea unguium: Secondary | ICD-10-CM | POA: Diagnosis not present

## 2019-07-06 DIAGNOSIS — M79674 Pain in right toe(s): Secondary | ICD-10-CM | POA: Diagnosis not present

## 2019-07-06 DIAGNOSIS — M79675 Pain in left toe(s): Secondary | ICD-10-CM | POA: Diagnosis not present

## 2019-07-06 DIAGNOSIS — M79672 Pain in left foot: Secondary | ICD-10-CM | POA: Diagnosis not present

## 2019-07-06 DIAGNOSIS — M79671 Pain in right foot: Secondary | ICD-10-CM | POA: Diagnosis not present

## 2019-07-06 DIAGNOSIS — Q828 Other specified congenital malformations of skin: Secondary | ICD-10-CM | POA: Diagnosis not present

## 2019-07-06 NOTE — Progress Notes (Signed)
Subjective: Brenda Hammond presents to clinic with cc of painful mycotic toenails and porokeratoses b/l feet which are aggravated when weightbearing with and without shoe gear.  This pain limits her daily activities. Pain symptoms resolve with periodic professional debridement.  She is accompanied by her husband who also has an appointment on today.  Ronnald Nian, DO is her PCP.   Medications reviewed in chart.  Allergies  Allergen Reactions  . Sulfa Antibiotics Anaphylaxis    REACTION: elevated HR and BP  . Alendronate Other (See Comments)    "twitching" "twitching"  . Ampicillin     REACTION: rash and high fever  . Aspirin     REACTION: ears ringing  . Cyclobenzaprine Hcl     REACTION: nightmares  . Metaxalone     REACTION: nightmares  . Nitrofurantoin     REACTION: elevated BP and chest pain  . Nsaids     REACTION: rash  . Penicillins     REACTION: rash, high fever  . Sulfonamide Derivatives     REACTION: elevated HR and BP  . Nortriptyline Hcl Rash     Objective: There were no vitals filed for this visit.  Physical Examination:  Vascular  Examination: Capillary refill time immediate b/l.  Palpable pedal pulses b/l.  Digital hair diminished b/l.   No edema noted b/l.  Skin temperature gradient WNL b/l.  Dermatological Examination: Skin with normal turgor, texture and tone b/l.  No open wounds b/l.  No interdigital macerations noted b/l.  Elongated, thick, discolored brittle toenails with subungual debris and pain on dorsal palpation of nailbeds 1-5 b/l.  Porokeratotic lesions submet head 5 left, submet head 2, 4 right foot with tenderness to palpation. No erythema, no edema, no drainage, no flocculence.   Musculoskeletal Examination: Muscle strength 5/5 to all muscle groups b/l.  No pain, crepitus or joint discomfort with active/passive ROM.  Neurological Examination: Sensation intact 5/5 b/l with 10 gram monofilament.  Vibratory  sensation intact b/l.  Proprioceptive sensation intact b/l.  Assessment: 1. Mycotic nail infection with pain 1-5 b/l 2. Porokeratoses submet head 5 left, submet head 2, 4 right foot  3. Pain in feet  Plan: 1. Toenails 1-5 b/l were debrided in length and girth without iatrogenic laceration. Porokeratosis submet head 5 left, submet head 2, 4 right foot pared and enucleated with sterile scalpel blade without incident. Continue soft, supportive shoe gear daily. Report any pedal injuries to medical professional. Follow up 3 months. Patient/POA to call should there be a question/concern in there interim.

## 2019-07-06 NOTE — Patient Instructions (Signed)

## 2019-07-10 DIAGNOSIS — Q828 Other specified congenital malformations of skin: Secondary | ICD-10-CM

## 2019-07-30 ENCOUNTER — Telehealth: Payer: Self-pay

## 2019-07-30 MED ORDER — MEMANTINE HCL 10 MG PO TABS: 10.0000 mg | ORAL_TABLET | Freq: Two times a day (BID) | ORAL | 3 refills | Status: DC

## 2019-07-30 NOTE — Telephone Encounter (Signed)
Dr.Willis can you send in a regular generic memantine rx. Pt is done with the titration pack.

## 2019-07-30 NOTE — Telephone Encounter (Signed)
A prescription for Namenda will be sent in.
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
## 2019-09-04 ENCOUNTER — Telehealth: Payer: Self-pay | Admitting: Neurology

## 2019-09-04 ENCOUNTER — Ambulatory Visit: Payer: Medicare Other | Admitting: Neurology

## 2019-09-04 ENCOUNTER — Other Ambulatory Visit: Payer: Self-pay

## 2019-09-04 ENCOUNTER — Encounter: Payer: Self-pay | Admitting: Neurology

## 2019-09-04 VITALS — BP 132/82 | HR 68 | Temp 97.0°F | Ht 60.0 in | Wt 126.2 lb

## 2019-09-04 DIAGNOSIS — R413 Other amnesia: Secondary | ICD-10-CM | POA: Diagnosis not present

## 2019-09-04 NOTE — Telephone Encounter (Signed)
Pt's daughter Oneal Grout called wanting to speak to the provider about today's visit. Please advise.

## 2019-09-04 NOTE — Patient Instructions (Signed)
It was good to see you today  Continue taking Namenda  See you back in 6 months

## 2019-09-05 NOTE — Telephone Encounter (Signed)
I called pts daughter.  She was asking about medications if any change.  I relayed that no changes at this time, Memantine 10mg  po bid.  SSRI for mood agitation if things progress.   She verbalized understanding.

## 2019-09-06 NOTE — Progress Notes (Signed)
I have read the note, and I agree with the clinical assessment and plan.  Sayla Golonka K Quaid Yeakle   

## 2019-09-14 ENCOUNTER — Telehealth: Payer: Self-pay | Admitting: Family Medicine

## 2019-09-14 NOTE — Telephone Encounter (Signed)
Left message for patient to schedule Annual Wellness Visit.  Please schedule with Nurse Health Advisor Victoria Britt, RN at [facility]  

## 2019-10-05 ENCOUNTER — Ambulatory Visit: Payer: Medicare Other | Admitting: Podiatry

## 2019-10-05 ENCOUNTER — Encounter: Payer: Self-pay | Admitting: Podiatry

## 2019-10-05 ENCOUNTER — Other Ambulatory Visit: Payer: Self-pay

## 2019-10-05 VITALS — Temp 97.8°F

## 2019-10-05 DIAGNOSIS — M79674 Pain in right toe(s): Secondary | ICD-10-CM

## 2019-10-05 DIAGNOSIS — M79675 Pain in left toe(s): Secondary | ICD-10-CM

## 2019-10-05 DIAGNOSIS — B351 Tinea unguium: Secondary | ICD-10-CM | POA: Diagnosis not present

## 2019-10-05 DIAGNOSIS — M79672 Pain in left foot: Secondary | ICD-10-CM

## 2019-10-05 DIAGNOSIS — Q828 Other specified congenital malformations of skin: Secondary | ICD-10-CM

## 2019-10-05 DIAGNOSIS — M79671 Pain in right foot: Secondary | ICD-10-CM

## 2019-10-05 NOTE — Patient Instructions (Signed)

## 2019-10-08 NOTE — Progress Notes (Signed)
Subjective: Brenda Hammond presents today for follow up of painful porokeratotic lesion(s) b/l feet and painful mycotic toenails b/l that limit ambulation. Aggravating factors include weightbearing with and without shoe gear. Pain for both is relieved with periodic professional debridement..   Allergies  Allergen Reactions  . Sulfa Antibiotics Anaphylaxis    REACTION: elevated HR and BP  . Alendronate Other (See Comments)    "twitching" "twitching"  . Ampicillin     REACTION: rash and high fever  . Aspirin     REACTION: ears ringing  . Cyclobenzaprine Hcl     REACTION: nightmares  . Metaxalone     REACTION: nightmares  . Nitrofurantoin     REACTION: elevated BP and chest pain  . Nsaids     REACTION: rash  . Penicillins     REACTION: rash, high fever  . Sulfonamide Derivatives     REACTION: elevated HR and BP  . Nortriptyline Hcl Rash     Objective: Vitals:   10/05/19 1428  Temp: 97.8 F (36.6 C)    Pt 86 y.o. year old female  in NAD. AAO x 3.   Vascular Examination:  Capillary refill time to digits immediate b/l. Palpable DP pulses b/l. Palpable PT pulses b/l. Pedal hair absent b/l Skin temperature gradient within normal limits b/l.  Dermatological Examination: Pedal skin with normal turgor, texture and tone bilaterally. No open wounds bilaterally. No interdigital macerations bilaterally. Toenails 1-5 b/l elongated, dystrophic, thickened, crumbly with subungual debris and tenderness to dorsal palpation. Porokeratotic lesion(s) submet head 2 right foot, submet head 3 right foot and submet head 5 left foot. No erythema, no edema, no drainage, no flocculence.  Musculoskeletal: Normal muscle strength 5/5 to all lower extremity muscle groups bilaterally, no pain crepitus or joint limitation noted with ROM b/l and hammertoes noted to the  2-5 bilaterally  Neurological: Protective sensation intact 5/5 intact bilaterally with 10g monofilament b/l Vibratory sensation intact  b/l Proprioception intact bilaterally  Assessment: No diagnosis found.  Plan: -Toenails 1-5 b/l were debrided in length and girth with sterile nail nippers and dremel without iatrogenic bleeding.  -Painful porokeratotic lesion(s) submet head 2 right foot, submet head 3 right foot and submet head 5 left foot pared and enucleated with sterile scalpel blade without incident. -Patient to continue soft, supportive shoe gear daily. -Patient to report any pedal injuries to medical professional immediately. -Patient/POA to call should there be question/concern in the interim.  Return in about 3 months (around 01/04/2020).

## 2019-10-11 ENCOUNTER — Other Ambulatory Visit: Payer: Self-pay

## 2019-10-11 ENCOUNTER — Ambulatory Visit: Payer: Medicare Other | Admitting: Family Medicine

## 2019-10-12 ENCOUNTER — Encounter: Payer: Self-pay | Admitting: Family Medicine

## 2019-10-12 ENCOUNTER — Ambulatory Visit (INDEPENDENT_AMBULATORY_CARE_PROVIDER_SITE_OTHER): Payer: Medicare Other | Admitting: Family Medicine

## 2019-10-12 VITALS — BP 128/60 | HR 71 | Temp 97.1°F | Ht 60.0 in | Wt 127.4 lb

## 2019-10-12 DIAGNOSIS — R413 Other amnesia: Secondary | ICD-10-CM

## 2019-10-12 DIAGNOSIS — K219 Gastro-esophageal reflux disease without esophagitis: Secondary | ICD-10-CM

## 2019-10-12 DIAGNOSIS — E782 Mixed hyperlipidemia: Secondary | ICD-10-CM | POA: Diagnosis not present

## 2019-10-12 DIAGNOSIS — M8949 Other hypertrophic osteoarthropathy, multiple sites: Secondary | ICD-10-CM | POA: Diagnosis not present

## 2019-10-12 DIAGNOSIS — M8589 Other specified disorders of bone density and structure, multiple sites: Secondary | ICD-10-CM | POA: Diagnosis not present

## 2019-10-12 DIAGNOSIS — M159 Polyosteoarthritis, unspecified: Secondary | ICD-10-CM

## 2019-10-12 NOTE — Progress Notes (Signed)
Brenda Hammond is a 86 y.o. female  Chief Complaint  Patient presents with  . Follow-up    6 month follow up    HPI: Brenda Hammond is a 86 y.o. female who is here with her husband and daughter, [name] for annual exam, fasting labs, and follow-up on chronic medial issues including anemia, anxiety, hyperlipidemia, osteopenia, DJD, cognitive decline.  Since her last OV with me 6 mo ago, pt has been seen in f/u by neuro ([facility]). She remains on Namenda 10mg  BID and SSRI is being considered to help w/ some behavioral changes/agitation. Pt has also seen podiatry for B/L foot pain, B/L onychomycosis.  Appetite is good, does love sweets/chocolate. Sleep is good. No falls.  Pt denies HA, dizziness, CP, SOB, LE edema, n/v/d/c.  Last Dexa in 2018 - osteopenia w/ T-score = -2.1 Labs done in 03/2019.   Past Medical History:  Diagnosis Date  . Anemia   . Anxiety   . Atrial fibrillation ([place])   . Colon adenocarcinoma ([place])   . DJD (degenerative joint disease)   . Fibromyalgia   . GERD (gastroesophageal reflux disease)   . History of poliomyelitis   . History of UTI   . Hyperlipemia   . Memory disorder 03/01/2019  . Other drug allergy(995.27)   . Scoliosis   . Surgical or other procedure not carried out because of patient's decision   . Venous insufficiency     Past Surgical History:  Procedure Laterality Date  . APPENDECTOMY    . LOW ANTERIOR BOWEL RESECTION  06/2009   with anastomosis for colon cancer  . OTHER SURGICAL HISTORY     T & A  . TOTAL ABDOMINAL HYSTERECTOMY W/ BILATERAL SALPINGOOPHORECTOMY  1980s    Social History   Socioeconomic History  . Marital status: Married    Spouse name: Enslee Echavarria  . Number of children: 1  . Years of education: 27  . Highest education level: Not on file  Occupational History  . Occupation: Office manager    Comment: Works part time    Employer: [employer]  Tobacco Use  . Smoking status: Never Smoker  .  Smokeless tobacco: Never Used  Substance and Sexual Activity  . Alcohol use: No  . Drug use: No  . Sexual activity: Not on file  Other Topics Concern  . Not on file  Social History Narrative   Originally from [facility] and has lived with her husband in [place] since 1968.   Fun: Exercises regularly, [place]over and have breakfast, walking.   Denies abuse and feels safe at home.       Right handed    Caffeine ~ 3 cups per day    Living with his daughter    Social Determinants of Health   Financial Resource Strain:   . Difficulty of Paying Living Expenses:   Food Insecurity:   . Worried About Charity fundraiser in the Last Year:   . Arboriculturist in the Last Year:   Transportation Needs:   . Film/video editor (Medical):   Marland Kitchen Lack of Transportation (Non-Medical):   Physical Activity:   . Days of Exercise per Week:   . Minutes of Exercise per Session:   Stress:   . Feeling of Stress :   Social Connections:   . Frequency of Communication with Friends and Family:   . Frequency of Social Gatherings with Friends and Family:   . Attends Religious Services:   .  Active Member of Clubs or Organizations:   . Attends Archivist Meetings:   Marland Kitchen Marital Status:   Intimate Partner Violence:   . Fear of Current or Ex-Partner:   . Emotionally Abused:   Marland Kitchen Physically Abused:   . Sexually Abused:     Family History  Problem Relation Age of Onset  . Stroke Father   . Cancer Mother        brain  . Colon cancer Neg Hx      Immunization History  Administered Date(s) Administered  . Tdap 04/12/2018    Outpatient Encounter Medications as of 10/12/2019  Medication Sig  . acetaminophen (TYLENOL) 325 MG tablet Take 650 mg by mouth. 1 per day  . cholecalciferol (VITAMIN D) 1000 units tablet Take 1,000 Units by mouth daily.  Marland Kitchen loratadine (CLARITIN) 10 MG tablet Take by mouth as needed.   . Magnesium 100 MG CAPS Take by mouth.  . memantine (NAMENDA) 10 MG tablet Take 1 tablet  (10 mg total) by mouth 2 (two) times daily.  . Multiple Vitamins-Minerals (MULTIPLE VITAMINS/WOMENS PO) Take by mouth.   No facility-administered encounter medications on file as of 10/12/2019.     ROS: Pertinent positives and negatives noted in HPI. Remainder of ROS non-contributory    Allergies  Allergen Reactions  . Sulfa Antibiotics Anaphylaxis    REACTION: elevated HR and BP  . Alendronate Other (See Comments)    "twitching" "twitching"  . Ampicillin     REACTION: rash and high fever  . Aspirin     REACTION: ears ringing  . Cyclobenzaprine Hcl     REACTION: nightmares  . Metaxalone     REACTION: nightmares  . Nitrofurantoin     REACTION: elevated BP and chest pain  . Nsaids     REACTION: rash  . Penicillins     REACTION: rash, high fever  . Sulfonamide Derivatives     REACTION: elevated HR and BP  . Nortriptyline Hcl Rash    BP 128/60 (BP Location: Left Arm, Patient Position: Sitting, Cuff Size: Normal)   Pulse 71   Temp (!) 97.1 F (36.2 C) (Temporal)   Ht 5' (1.524 m)   Wt 127 lb 6.4 oz (57.8 kg)   SpO2 97%   BMI 24.88 kg/m    Wt Readings from Last 3 Encounters:  10/12/19 127 lb 6.4 oz (57.8 kg)  09/04/19 126 lb 3.2 oz (57.2 kg)  04/12/19 115 lb 6.4 oz (52.3 kg)    Physical Exam  Constitutional: She is oriented to person, place, and time. She appears well-developed and well-nourished. No distress.  Cardiovascular: Normal rate, regular rhythm and intact distal pulses.  Pulmonary/Chest: Effort normal and breath sounds normal. No respiratory distress.  Abdominal: Soft. Bowel sounds are normal.  Musculoskeletal:        General: Edema (B/L pedal edema +1) present.  Neurological: She is alert and oriented to person, place, and time.  Skin: Skin is warm and dry.  Psychiatric: She has a normal mood and affect. Her behavior is normal.     A/P:  1. Mixed hyperlipidemia - last FLP in 03/2019 - not on statin  2. Gastroesophageal reflux disease without  esophagitis - stable, controlled - not on any meds  3. Primary osteoarthritis involving multiple joints - pt ambulates with cane - takes tylenol PRN  4. Osteopenia of multiple sites - on Vit D 1000IU daily - DG Bone Density; Future - check Vit D at f/u appt in 6 mo  5. Memory disorder - follows with neurology - cont namenda 10mg  BID   I personally spent 30 min with the patient today and greater than 50% was spent in counseling, coordination of care, education   This visit occurred during the SARS-CoV-2 public health emergency.  Safety protocols were in place, including screening questions prior to the visit, additional usage of staff PPE, and extensive cleaning of exam room while observing appropriate contact time as indicated for disinfecting solutions.

## 2019-10-23 NOTE — Progress Notes (Signed)
This visit is being conducted via phone call  - after an attmept to do on video chat - due to the COVID-19 pandemic. This patient has given me verbal consent via phone to conduct this visit, patient states they are participating from their home address. Some vital signs may be absent or patient reported.   Patient identification: identified by name, DOB, and current address.   Subjective:   Brenda Hammond is a 87 y.o. female who presents for Medicare Annual (Subsequent) preventive examination.  Enjoys bible study and prayer. Also plays card games.  Review of Systems:  Home Safety/Smoke Alarms: Feels safe in home. Smoke alarms in place.   Lives w/ husband in tri-level home. Uses cane.   Female:    Mammo- declines      Dexa scan- declines          Objective:     Vitals: Unable to assess. This visit is enabled though telemedicine due to Covid 19.   Advanced Directives 10/24/2019  Does Patient Have a Medical Advance Directive? Yes  Type of Paramedic of [place];Living will  Does patient want to make changes to medical advance directive? No - Patient declined  Copy of [place] in Chart? Yes - validated most recent copy scanned in chart (See row information)    Tobacco Social History   Tobacco Use  Smoking Status Never Smoker  Smokeless Tobacco Never Used     Counseling given: Not Answered   Clinical Intake: Pain : No/denies pain     Past Medical History:  Diagnosis Date  . Anemia   . Anxiety   . Atrial fibrillation ([place])   . Colon adenocarcinoma ([place])   . DJD (degenerative joint disease)   . Fibromyalgia   . GERD (gastroesophageal reflux disease)   . History of poliomyelitis   . History of UTI   . Hyperlipemia   . Memory disorder 03/01/2019  . Other drug allergy(995.27)   . Scoliosis   . Surgical or other procedure not carried out because of patient's decision   . Venous insufficiency    Past Surgical History:    Procedure Laterality Date  . APPENDECTOMY    . LOW ANTERIOR BOWEL RESECTION  06/2009   with anastomosis for colon cancer  . OTHER SURGICAL HISTORY     T & A  . TOTAL ABDOMINAL HYSTERECTOMY W/ BILATERAL SALPINGOOPHORECTOMY  1980s   Family History  Problem Relation Age of Onset  . Stroke Father   . Cancer Mother        brain  . Colon cancer Neg Hx    Social History   Socioeconomic History  . Marital status: Married    Spouse name: Tocarra Spikes  . Number of children: 1  . Years of education: 74  . Highest education level: Not on file  Occupational History  . Occupation: Office manager    Comment: Works part time    Employer: [employer]  Tobacco Use  . Smoking status: Never Smoker  . Smokeless tobacco: Never Used  Substance and Sexual Activity  . Alcohol use: No  . Drug use: No  . Sexual activity: Not on file  Other Topics Concern  . Not on file  Social History Narrative   Originally from [facility] and has lived with her husband in [place] since 1968.   Fun: Exercises regularly, [place]over and have breakfast, walking.   Denies abuse and feels safe at home.  Right handed    Caffeine ~ 3 cups per day    Living with his daughter    Social Determinants of Health   Financial Resource Strain:   . Difficulty of Paying Living Expenses:   Food Insecurity:   . Worried About Charity fundraiser in the Last Year:   . Arboriculturist in the Last Year:   Transportation Needs:   . Film/video editor (Medical):   Marland Kitchen Lack of Transportation (Non-Medical):   Physical Activity:   . Days of Exercise per Week:   . Minutes of Exercise per Session:   Stress:   . Feeling of Stress :   Social Connections:   . Frequency of Communication with Friends and Family:   . Frequency of Social Gatherings with Friends and Family:   . Attends Religious Services:   . Active Member of Clubs or Organizations:   . Attends Archivist Meetings:   Marland Kitchen Marital Status:      Outpatient Encounter Medications as of 10/24/2019  Medication Sig  . acetaminophen (TYLENOL) 325 MG tablet Take 650 mg by mouth. 1 per day  . cholecalciferol (VITAMIN D) 1000 units tablet Take 1,000 Units by mouth daily.  Marland Kitchen loratadine (CLARITIN) 10 MG tablet Take by mouth as needed.   . Magnesium 100 MG CAPS Take by mouth.  . memantine (NAMENDA) 10 MG tablet Take 1 tablet (10 mg total) by mouth 2 (two) times daily.  . Multiple Vitamins-Minerals (MULTIPLE VITAMINS/WOMENS PO) Take by mouth.   No facility-administered encounter medications on file as of 10/24/2019.    Activities of Daily Living In your present state of health, do you have any difficulty performing the following activities: 10/24/2019  Hearing? N  Vision? N  Difficulty concentrating or making decisions? N  Walking or climbing stairs? N  Dressing or bathing? N  Doing errands, shopping? Y  Comment never driven.  Preparing Food and eating ? N  Using the Toilet? N  In the past six months, have you accidently leaked urine? N  Do you have problems with loss of bowel control? N  Managing your Medications? N  Managing your Finances? N  Housekeeping or managing your Housekeeping? N  Some recent data might be hidden    Patient Care Team: Ronnald Nian, DO as PCP - General (Family Medicine)    Assessment:   This is a routine wellness examination for Niang. Physical assessment deferred to PCP.  Exercise Activities and Dietary recommendations Current Exercise Habits: Home exercise routine, Type of exercise: walking, Time (Minutes): 20, Frequency (Times/Week): 5, Weekly Exercise (Minutes/Week): 100, Intensity: Mild, Exercise limited by: None identified   Diet (meal preparation, eat out, water intake, caffeinated beverages, dairy products, fruits and vegetables): in general, a "healthy" diet  , well balanced    Goals    . maintain current health       Fall Risk Fall Risk  10/24/2019 10/12/2019 04/12/2018  08/11/2016 02/28/2014  Falls in the past year? 0 0 No No No  Number falls in past yr: 0 - - - -  Injury with Fall? 0 - - - -  Follow up Education provided;Falls prevention discussed - - - -   Depression Screen PHQ 2/9 Scores 10/24/2019 08/11/2016 02/28/2014 02/21/2014  PHQ - 2 Score 0 0 0 0     Cognitive Function MMSE - Mini Mental State Exam 09/04/2019 03/01/2019  Orientation to time 5 5  Orientation to Place 4 5  Registration 3 3  Attention/ Calculation 5 5  Recall 2 2  Language- name 2 objects 2 2  Language- repeat 1 1  Language- follow 3 step command 3 3  Language- read & follow direction 1 1  Write a sentence 1 1  Copy design 1 1  Total score 28 29        Immunization History  Administered Date(s) Administered  . Tdap 04/12/2018   Screening Tests Health Maintenance  Topic Date Due  . COVID-19 Vaccine (1) Never done  . PNA vac Low Risk Adult (1 of 2 - PCV13) Never done  . INFLUENZA VACCINE  01/27/2020  . TETANUS/TDAP  04/12/2028  . DEXA SCAN  Completed       Plan:    Please schedule your next medicare wellness visit with me in 1 yr.  Continue to eat heart healthy diet (full of fruits, vegetables, whole grains, lean protein, water--limit salt, fat, and sugar intake) and increase physical activity as tolerated.  Continue doing brain stimulating activities (puzzles, reading, adult coloring books, staying active) to keep memory sharp.     I have personally reviewed and noted the following in the patient's chart:   . Medical and social history . Use of alcohol, tobacco or illicit drugs  . Current medications and supplements . Functional ability and status . Nutritional status . Physical activity . Advanced directives . List of other physicians . Hospitalizations, surgeries, and ER visits in previous 12 months . Vitals . Screenings to include cognitive, depression, and falls . Referrals and appointments  In addition, I have reviewed and discussed with patient  certain preventive protocols, quality metrics, and best practice recommendations. A written personalized care plan for preventive services as well as general preventive health recommendations were provided to patient.     Naaman Plummer [place], [place]  10/24/2019

## 2019-10-24 ENCOUNTER — Ambulatory Visit (INDEPENDENT_AMBULATORY_CARE_PROVIDER_SITE_OTHER): Payer: Medicare Other | Admitting: *Deleted

## 2019-10-24 ENCOUNTER — Encounter: Payer: Self-pay | Admitting: *Deleted

## 2019-10-24 DIAGNOSIS — Z0000 Encounter for general adult medical examination without abnormal findings: Secondary | ICD-10-CM

## 2019-10-24 NOTE — Patient Instructions (Signed)
Please schedule your next medicare wellness visit with me in 1 yr.  Continue to eat heart healthy diet (full of fruits, vegetables, whole grains, lean protein, water--limit salt, fat, and sugar intake) and increase physical activity as tolerated.  Continue doing brain stimulating activities (puzzles, reading, adult coloring books, staying active) to keep memory sharp.    Brenda Hammond , Thank you for taking time to come for your Medicare Wellness Visit. I appreciate your ongoing commitment to your health goals. Please review the following plan we discussed and let me know if I can assist you in the future.   These are the goals we discussed: Goals    . maintain current health       This is a list of the screening recommended for you and due dates:  Health Maintenance  Topic Date Due  . COVID-19 Vaccine (1) Never done  . Pneumonia vaccines (1 of 2 - PCV13) Never done  . Flu Shot  01/27/2020  . Tetanus Vaccine  04/12/2028  . DEXA scan (bone density measurement)  Completed    Preventive Care 65 Years and Older, Female Preventive care refers to lifestyle choices and visits with your health care provider that can promote health and wellness. This includes:  A yearly physical exam. This is also called an annual well check.  Regular dental and eye exams.  Immunizations.  Screening for certain conditions.  Healthy lifestyle choices, such as diet and exercise. What can I expect for my preventive care visit? Physical exam Your health care provider will check:  Height and weight. These may be used to calculate body mass index (BMI), which is a measurement that tells if you are at a healthy weight.  Heart rate and blood pressure.  Your skin for abnormal spots. Counseling Your health care provider may ask you questions about:  Alcohol, tobacco, and drug use.  Emotional well-being.  Home and relationship well-being.  Sexual activity.  Eating habits.  History of  falls.  Memory and ability to understand (cognition).  Work and work environment.  Pregnancy and menstrual history. What immunizations do I need?  Influenza (flu) vaccine  This is recommended every year. Tetanus, diphtheria, and pertussis (Tdap) vaccine  You may need a Td booster every 10 years. Varicella (chickenpox) vaccine  You may need this vaccine if you have not already been vaccinated. Zoster (shingles) vaccine  You may need this after age 60. Pneumococcal conjugate (PCV13) vaccine  One dose is recommended after age 65. Pneumococcal polysaccharide (PPSV23) vaccine  One dose is recommended after age 65. Measles, mumps, and rubella (MMR) vaccine  You may need at least one dose of MMR if you were born in 1957 or later. You may also need a second dose. Meningococcal conjugate (MenACWY) vaccine  You may need this if you have certain conditions. Hepatitis A vaccine  You may need this if you have certain conditions or if you travel or work in places where you may be exposed to hepatitis A. Hepatitis B vaccine  You may need this if you have certain conditions or if you travel or work in places where you may be exposed to hepatitis B. Haemophilus influenzae type b (Hib) vaccine  You may need this if you have certain conditions. You may receive vaccines as individual doses or as more than one vaccine together in one shot (combination vaccines). Talk with your health care provider about the risks and benefits of combination vaccines. What tests do I need? Blood tests  Lipid   and cholesterol levels. These may be checked every 5 years, or more frequently depending on your overall health.  Hepatitis C test.  Hepatitis B test. Screening  Lung cancer screening. You may have this screening every year starting at age 63 if you have a 30-pack-year history of smoking and currently smoke or have quit within the past 15 years.  Colorectal cancer screening. All adults should  have this screening starting at age 82 and continuing until age 78. Your health care provider may recommend screening at age 41 if you are at increased risk. You will have tests every 1-10 years, depending on your results and the type of screening test.  Diabetes screening. This is done by checking your blood sugar (glucose) after you have not eaten for a while (fasting). You may have this done every 1-3 years.  Mammogram. This may be done every 1-2 years. Talk with your health care provider about how often you should have regular mammograms.  BRCA-related cancer screening. This may be done if you have a family history of breast, ovarian, tubal, or peritoneal cancers. Other tests  Sexually transmitted disease (STD) testing.  Bone density scan. This is done to screen for osteoporosis. You may have this done starting at age 22. Follow these instructions at home: Eating and drinking  Eat a diet that includes fresh fruits and vegetables, whole grains, lean protein, and low-fat dairy products. Limit your intake of foods with high amounts of sugar, saturated fats, and salt.  Take vitamin and mineral supplements as recommended by your health care provider.  Do not drink alcohol if your health care provider tells you not to drink.  If you drink alcohol: ? Limit how much you have to 0-1 drink a day. ? Be aware of how much alcohol is in your drink. In the [place] one drink equals one 12 oz bottle of beer (355 mL), one 5 oz glass of wine (148 mL), or one 1 oz glass of hard liquor (44 mL). Lifestyle  Take daily care of your teeth and gums.  Stay active. Exercise for at least 30 minutes on 5 or more days each week.  Do not use any products that contain nicotine or tobacco, such as cigarettes, e-cigarettes, and chewing tobacco. If you need help quitting, ask your health care provider.  If you are sexually active, practice safe sex. Use a condom or other form of protection in order to prevent STIs  (sexually transmitted infections).  Talk with your health care provider about taking a low-dose aspirin or statin. What's next?  Go to your health care provider once a year for a well check visit.  Ask your health care provider how often you should have your eyes and teeth checked.  Stay up to date on all vaccines. This information is not intended to replace advice given to you by your health care provider. Make sure you discuss any questions you have with your health care provider. Document Revised: 06/08/2018 Document Reviewed: 06/08/2018 Elsevier Patient Education  2020 [employer].

## 2019-12-27 ENCOUNTER — Ambulatory Visit
Admission: RE | Admit: 2019-12-27 | Discharge: 2019-12-27 | Disposition: A | Discharge status: Home / Self Care | Payer: Medicare Other | Source: Ambulatory Visit | Attending: Family Medicine | Admitting: Family Medicine

## 2019-12-27 ENCOUNTER — Other Ambulatory Visit: Payer: Self-pay

## 2019-12-27 DIAGNOSIS — M810 Age-related osteoporosis without current pathological fracture: Secondary | ICD-10-CM | POA: Diagnosis not present

## 2019-12-27 DIAGNOSIS — M8589 Other specified disorders of bone density and structure, multiple sites: Secondary | ICD-10-CM

## 2019-12-27 DIAGNOSIS — Z780 Asymptomatic menopausal state: Secondary | ICD-10-CM | POA: Diagnosis not present

## 2020-01-02 ENCOUNTER — Telehealth: Payer: Self-pay | Admitting: Family Medicine

## 2020-01-02 DIAGNOSIS — M810 Age-related osteoporosis without current pathological fracture: Secondary | ICD-10-CM

## 2020-01-02 NOTE — Telephone Encounter (Signed)
Patient is returning the call. CB is [phone]

## 2020-01-02 NOTE — Telephone Encounter (Signed)
Called pt back and Left message on voicemail to call office.

## 2020-01-04 NOTE — Telephone Encounter (Signed)
Patient's daughter called back to go over results of bone scan and new medications. I let her know you will return her call Monday. Daughter also wanted to let us know that patient is not taking Namenda due to flatulence.

## 2020-01-07 NOTE — Telephone Encounter (Signed)
Please call pts daughter [name] to let her know pts bone density scan shows decreased bone density compared to when last done in 09/2016. She now has osteoporosis. In addition to continuing with daily Vit D 1000-2000IU and calcium 1200mg  supplements, it would be beneficial to start a medication like fosamax to help improve her osteoporosis. If pt and daughter are agreeable, I will send me to pharm   Pts daughter was called and given results of Bone density. They would like to start medication, Fosamax. [facility] [address]. Daughter advised that Physician is out of office and will send medication in upon returning

## 2020-01-08 DIAGNOSIS — Z961 Presence of intraocular lens: Secondary | ICD-10-CM | POA: Diagnosis not present

## 2020-01-09 MED ORDER — ALENDRONATE SODIUM 70 MG PO TABS: 70.0000 mg | ORAL_TABLET | ORAL | 3 refills | Status: DC

## 2020-01-09 NOTE — Telephone Encounter (Signed)
Pt appears to have experienced "twitching" when taking fosamax in the past. Please confirm this with daughter. We could consider prolia injections rather than a different oral med if pt is agreeable

## 2020-01-09 NOTE — Addendum Note (Signed)
Addended by: Ronnald Nian on: 01/09/2020 12:33 PM   Modules accepted: Orders

## 2020-01-11 NOTE — Telephone Encounter (Signed)
LMD

## 2020-01-14 NOTE — Telephone Encounter (Signed)
I spoke wit pt's daughter and she said that she doesn't think that her mother has ever taken the Fosamax.  Daughter informed me that they had picked up the medication today and will begin taking it every week.

## 2020-04-14 ENCOUNTER — Encounter: Payer: Self-pay | Admitting: Podiatry

## 2020-04-14 ENCOUNTER — Ambulatory Visit: Payer: Medicare Other | Admitting: Podiatry

## 2020-04-14 ENCOUNTER — Other Ambulatory Visit: Payer: Self-pay

## 2020-04-14 DIAGNOSIS — M79672 Pain in left foot: Secondary | ICD-10-CM

## 2020-04-14 DIAGNOSIS — M79675 Pain in left toe(s): Secondary | ICD-10-CM

## 2020-04-14 DIAGNOSIS — B351 Tinea unguium: Secondary | ICD-10-CM

## 2020-04-14 DIAGNOSIS — M79671 Pain in right foot: Secondary | ICD-10-CM

## 2020-04-14 DIAGNOSIS — Q828 Other specified congenital malformations of skin: Secondary | ICD-10-CM

## 2020-04-14 DIAGNOSIS — M79674 Pain in right toe(s): Secondary | ICD-10-CM | POA: Diagnosis not present

## 2020-04-16 ENCOUNTER — Other Ambulatory Visit: Payer: Self-pay

## 2020-04-17 ENCOUNTER — Encounter: Payer: Self-pay | Admitting: Family Medicine

## 2020-04-17 ENCOUNTER — Ambulatory Visit (INDEPENDENT_AMBULATORY_CARE_PROVIDER_SITE_OTHER): Payer: Medicare Other | Admitting: Family Medicine

## 2020-04-17 VITALS — BP 122/70 | HR 83 | Temp 97.4°F | Ht 60.0 in | Wt 118.6 lb

## 2020-04-17 DIAGNOSIS — M810 Age-related osteoporosis without current pathological fracture: Secondary | ICD-10-CM | POA: Diagnosis not present

## 2020-04-17 DIAGNOSIS — Z2821 Immunization not carried out because of patient refusal: Secondary | ICD-10-CM | POA: Diagnosis not present

## 2020-04-17 DIAGNOSIS — R413 Other amnesia: Secondary | ICD-10-CM

## 2020-04-17 DIAGNOSIS — E559 Vitamin D deficiency, unspecified: Secondary | ICD-10-CM | POA: Diagnosis not present

## 2020-04-17 DIAGNOSIS — E782 Mixed hyperlipidemia: Secondary | ICD-10-CM

## 2020-04-17 DIAGNOSIS — Z87440 Personal history of urinary (tract) infections: Secondary | ICD-10-CM | POA: Diagnosis not present

## 2020-04-17 NOTE — Progress Notes (Signed)
Brenda Hammond is a 86 y.o. female  Chief Complaint  Patient presents with  . Follow-up    6 month f/u, declines flu shot.    HPI: Brenda Hammond is a 86 y.o. female accompanied by her husband and daughter for routine f/u on chronic medical issues including HLD, osteoporosis, a-fib, memory disorder, Vit D deficiency. Pt is not taking the namenda as she feels it causes her to have increased flatulence. She does not want to see Dr. Jannifer Franklin anymore as she does not think it is necessary. She declines flu vaccine.   Appetite is good, they eat out a lot (almost daily) or do frozen meals. Pt loves sweets. Sleep - not good, at baseline.  She and husband like to walk daily at mall.   Last Dexa: 12/2019 - T-score = -2.6 - started on fosamax in 12/2019 Last vision appt: 12/2019 Last podiatry appt: 03/2020 Last neuro appt: 08/2019  Last labs: 03/2019  Past Medical History:  Diagnosis Date  . Anemia   . Anxiety   . Atrial fibrillation ([place])   . Colon adenocarcinoma ([place])   . DJD (degenerative joint disease)   . Fibromyalgia   . GERD (gastroesophageal reflux disease)   . History of poliomyelitis   . History of UTI   . Hyperlipemia   . Memory disorder 03/01/2019  . Other drug allergy(995.27)   . Scoliosis   . Surgical or other procedure not carried out because of patient's decision   . Venous insufficiency     Past Surgical History:  Procedure Laterality Date  . APPENDECTOMY    . LOW ANTERIOR BOWEL RESECTION  06/2009   with anastomosis for colon cancer  . OTHER SURGICAL HISTORY     T & A  . TOTAL ABDOMINAL HYSTERECTOMY W/ BILATERAL SALPINGOOPHORECTOMY  1980s    Social History   Socioeconomic History  . Marital status: Married    Spouse name: Gresia Isidoro  . Number of children: 1  . Years of education: 65  . Highest education level: Not on file  Occupational History  . Occupation: Office manager    Comment: Works part time    Employer: [employer]  Tobacco Use  . Smoking status: Never Smoker  . Smokeless tobacco: Never Used  Substance and Sexual Activity  . Alcohol use: No  . Drug use: No  . Sexual activity: Not on file  Other Topics Concern  . Not on file  Social History Narrative   Originally from [facility] and has lived with her husband in [place] since 1968.   Fun: Exercises regularly, [place] and have breakfast, walking.   Denies abuse and feels safe at home.       Right handed    Caffeine ~ 3 cups per day    Living with his daughter    Social Determinants of Health   Financial Resource Strain: Low Risk   . Difficulty of Paying Living Expenses: Not hard at all  Food Insecurity: No Food Insecurity  . Worried About Charity fundraiser in the Last Year: Never true  . Ran Out of Food in the Last Year: Never true  Transportation Needs: No Transportation Needs  . Lack of Transportation (Medical): No  . Lack of Transportation (Non-Medical): No  Physical Activity:   . Days of Exercise per Week: Not on file  . Minutes of Exercise per Session: Not on file  Stress:   . Feeling of Stress : Not on file  Social Connections:   . Frequency of Communication with Friends and Family: Not on file  . Frequency of Social Gatherings with Friends and Family: Not on file  . Attends Religious Services: Not on file  . Active Member of Clubs or Organizations: Not on file  . Attends Archivist Meetings: Not on file  . Marital Status: Not on file  Intimate Partner Violence:   . Fear of Current or Ex-Partner: Not on file  . Emotionally Abused: Not on file  . Physically Abused: Not on file  . Sexually Abused: Not on file    Family History  Problem Relation Age of Onset  . Stroke Father   . Cancer Mother        brain  . Colon cancer Neg Hx      Immunization History  Administered Date(s) Administered  . Tdap 04/12/2018    Outpatient Encounter Medications as of 04/17/2020  Medication Sig  . Acetaminophen  (TYLENOL ARTHRITIS EXT RELIEF PO) Take by mouth.  Marland Kitchen alendronate (FOSAMAX) 70 MG tablet Take 70 mg by mouth once a week.  . cholecalciferol (VITAMIN D) 1000 units tablet Take 1,000 Units by mouth daily.  Marland Kitchen loratadine (CLARITIN) 10 MG tablet Take by mouth as needed.   . Magnesium 100 MG CAPS Take by mouth.  . Multiple Vitamins-Minerals (MULTIPLE VITAMINS/WOMENS PO) Take by mouth.  Marland Kitchen acetaminophen (TYLENOL) 325 MG tablet Take 650 mg by mouth. 1 per day (Patient not taking: Reported on 04/17/2020)  . memantine (NAMENDA) 10 MG tablet Take 1 tablet (10 mg total) by mouth 2 (two) times daily. (Patient not taking: Reported on 04/17/2020)   No facility-administered encounter medications on file as of 04/17/2020.     ROS: Pertinent positives and negatives noted in HPI. Remainder of ROS non-contributory    Allergies  Allergen Reactions  . Sulfa Antibiotics Anaphylaxis    REACTION: elevated HR and BP  . Alendronate Other (See Comments)    "twitching" "twitching"  . Ampicillin     REACTION: rash and high fever  . Aspirin     REACTION: ears ringing  . Cyclobenzaprine Hcl     REACTION: nightmares  . Metaxalone     REACTION: nightmares  . Nitrofurantoin     REACTION: elevated BP and chest pain  . Nsaids     REACTION: rash  . Penicillins     REACTION: rash, high fever  . Sulfonamide Derivatives     REACTION: elevated HR and BP  . Aricept [Donepezil] Rash    Dizziness and cramping  . Nortriptyline Hcl Rash    BP 122/70   Pulse 83   Temp (!) 97.4 F (36.3 C) (Temporal)   Ht 5' (1.524 m)   Wt 118 lb 9.6 oz (53.8 kg)   SpO2 96%   BMI 23.16 kg/m    BP Readings from Last 3 Encounters:  04/17/20 122/70  10/12/19 128/60  09/04/19 132/82   Pulse Readings from Last 3 Encounters:  04/17/20 83  10/12/19 71  09/04/19 68   Wt Readings from Last 3 Encounters:  04/17/20 118 lb 9.6 oz (53.8 kg)  10/12/19 127 lb 6.4 oz (57.8 kg)  09/04/19 126 lb 3.2 oz (57.2 kg)     Physical  Exam Constitutional:      General: She is not in acute distress.    Appearance: Normal appearance. She is not ill-appearing.  Cardiovascular:     Rate and Rhythm: Normal rate and regular rhythm.     Pulses: Normal  pulses.  Pulmonary:     Effort: Pulmonary effort is normal. No respiratory distress.     Breath sounds: Normal breath sounds. No wheezing or rhonchi.  Abdominal:     General: Abdomen is flat. Bowel sounds are normal. There is no distension.     Palpations: Abdomen is soft.     Tenderness: There is no abdominal tenderness. There is no guarding or rebound.  Musculoskeletal:     Right lower leg: No edema.     Left lower leg: No edema.  Neurological:     Mental Status: She is alert and oriented to person, place, and time.  Psychiatric:        Mood and Affect: Mood normal.        Behavior: Behavior normal.      A/P:  1. Age-related osteoporosis without current pathological fracture - on fosamax weekly since 12/2019 - dexa 12/2019 - VITAMIN D 25 Hydroxy (Vit-D Deficiency, Fractures)  2. Memory disorder - pt not taking namenda and declines further f/u with neuro Dr. Jannifer Franklin  3. Mixed hyperlipidemia - Lipid panel - Comprehensive metabolic panel - CBC  4. Vitamin D deficiency - VITAMIN D 25 Hydroxy (Vit-D Deficiency, Fractures)  5. Influenza vaccination declined by patient  6. History of urinary tract infection - Urinalysis   This visit occurred during the SARS-CoV-2 public health emergency.  Safety protocols were in place, including screening questions prior to the visit, additional usage of staff PPE, and extensive cleaning of exam room while observing appropriate contact time as indicated for disinfecting solutions.

## 2020-04-17 NOTE — Progress Notes (Signed)
Subjective: Brenda Hammond presents today for follow up of painful porokeratotic lesion(s) b/l feet and painful mycotic toenails b/l that limit ambulation. Aggravating factors include weightbearing with and without shoe gear. Pain for both is relieved with periodic professional debridement.   Her daughter and husband are present during today's visit.  Allergies  Allergen Reactions  . Sulfa Antibiotics Anaphylaxis    REACTION: elevated HR and BP  . Alendronate Other (See Comments)    "twitching" "twitching"  . Ampicillin     REACTION: rash and high fever  . Aspirin     REACTION: ears ringing  . Cyclobenzaprine Hcl     REACTION: nightmares  . Metaxalone     REACTION: nightmares  . Nitrofurantoin     REACTION: elevated BP and chest pain  . Nsaids     REACTION: rash  . Penicillins     REACTION: rash, high fever  . Sulfonamide Derivatives     REACTION: elevated HR and BP  . Aricept [Donepezil] Rash    Dizziness and cramping  . Nortriptyline Hcl Rash     Objective: There were no vitals filed for this visit.  Pt 86 y.o. year old female  in NAD. AAO x 3.   Vascular Examination:  Capillary refill time to digits immediate b/l. Palpable DP pulses b/l. Palpable PT pulses b/l. Pedal hair absent b/l Skin temperature gradient within normal limits b/l.  Dermatological Examination: Pedal skin with normal turgor, texture and tone bilaterally. No open wounds bilaterally. No interdigital macerations bilaterally. Toenails 1-5 b/l elongated, dystrophic, thickened, crumbly with subungual debris and tenderness to dorsal palpation. Porokeratotic lesion(s) submet head 2 right foot, submet head 3 right foot and submet head 5 left foot. No erythema, no edema, no drainage, no flocculence.  Musculoskeletal: Normal muscle strength 5/5 to all lower extremity muscle groups bilaterally, no pain crepitus or joint limitation noted with ROM b/l and hammertoes noted to the  2-5  bilaterally  Neurological: Protective sensation intact 5/5 intact bilaterally with 10g monofilament b/l Vibratory sensation intact b/l Proprioception intact bilaterally  Assessment: 1. Pain due to onychomycosis of toenails of both feet   2. Porokeratosis   3. Pain in both feet    Plan: -Toenails 1-5 b/l were debrided in length and girth with sterile nail nippers and dremel without iatrogenic bleeding.  -Painful porokeratotic lesion(s) submet head 2 right foot, submet head 3 right foot and submet head 5 left foot pared and enucleated with sterile scalpel blade without incident. -Patient to continue soft, supportive shoe gear daily. -Patient to report any pedal injuries to medical professional immediately. -Patient/POA to call should there be question/concern in the interim.  Return in about 3 months (around 07/15/2020).

## 2020-04-18 LAB — URINALYSIS
Hgb urine dipstick: NEGATIVE
Leukocytes,Ua: NEGATIVE
Nitrite: NEGATIVE
Specific Gravity, Urine: >=1.03 — AB (ref 1.000–1.030)
Urine Glucose: NEGATIVE
Urobilinogen, UA: 0.2 (ref 0.0–1.0)
pH: 6 (ref 5.0–8.0)

## 2020-04-18 LAB — COMPREHENSIVE METABOLIC PANEL
ALT: 10 U/L (ref 0–35)
AST: 24 U/L (ref 0–37)
Albumin: 3.9 g/dL (ref 3.5–5.2)
Alkaline Phosphatase: 98 U/L (ref 39–117)
BUN: 12 mg/dL (ref 6–23)
CO2: 29 mEq/L (ref 19–32)
Calcium: 9.2 mg/dL (ref 8.4–10.5)
Chloride: 105 mEq/L (ref 96–112)
Creatinine, Ser: 0.96 mg/dL (ref 0.40–1.20)
GFR: 54.08 mL/min — ABNORMAL LOW (ref >60.00)
Glucose, Bld: 86 mg/dL (ref 70–99)
Potassium: 5.2 mEq/L — ABNORMAL HIGH (ref 3.5–5.1)
Sodium: 140 mEq/L (ref 135–145)
Total Bilirubin: 0.3 mg/dL (ref 0.2–1.2)
Total Protein: 6.7 g/dL (ref 6.0–8.3)

## 2020-04-18 LAB — LIPID PANEL
Cholesterol: 203 mg/dL — ABNORMAL HIGH (ref 0–200)
HDL: 53.9 mg/dL (ref >39.00)
LDL Cholesterol: 117 mg/dL — ABNORMAL HIGH (ref 0–99)
NonHDL: 149.43
Total CHOL/HDL Ratio: 4
Triglycerides: 164 mg/dL — ABNORMAL HIGH (ref 0.0–149.0)
VLDL: 32.8 mg/dL (ref 0.0–40.0)

## 2020-04-18 LAB — VITAMIN D 25 HYDROXY (VIT D DEFICIENCY, FRACTURES): VITD: 38.52 ng/mL (ref 30.00–100.00)

## 2020-04-18 LAB — CBC
HCT: 38.6 % (ref 36.0–46.0)
Hemoglobin: 12.8 g/dL (ref 12.0–15.0)
MCHC: 33.2 g/dL (ref 30.0–36.0)
MCV: 87.1 fl (ref 78.0–100.0)
Platelets: 249 10*3/uL (ref 150.0–400.0)
RBC: 4.43 Mil/uL (ref 3.87–5.11)
RDW: 13.9 % (ref 11.5–15.5)
WBC: 7.9 10*3/uL (ref 4.0–10.5)

## 2020-06-30 ENCOUNTER — Encounter: Payer: Self-pay | Admitting: Family

## 2020-06-30 ENCOUNTER — Telehealth (INDEPENDENT_AMBULATORY_CARE_PROVIDER_SITE_OTHER): Payer: Medicare Other | Admitting: Family

## 2020-06-30 VITALS — Ht 60.0 in | Wt 118.0 lb

## 2020-06-30 DIAGNOSIS — R55 Syncope and collapse: Secondary | ICD-10-CM

## 2020-06-30 DIAGNOSIS — J069 Acute upper respiratory infection, unspecified: Secondary | ICD-10-CM

## 2020-06-30 NOTE — Progress Notes (Signed)
Virtual Visit via Video   I connected with patient on 06/30/20 at  2:40 PM EST by a video enabled telemedicine application and verified that I am speaking with the correct person using two identifiers.  Location patient: Home Location provider: [employer], Office Persons participating in the virtual visit: Patient, Provider, CMA   I discussed the limitations of evaluation and management by telemedicine and the availability of in person appointments. The patient expressed understanding and agreed to proceed.  Subjective:   HPI:   87 year old female with a history of dementia request a video visit for symptoms of cough, sneezing and nasal congestion x 5 days. Daughter who is a Char[employer] fundraiser has been given her Claritin that helps. Her husband is ill as well. She has not been vaccinated.   Daughter also concerned that she has had several near-syncope episodes that her husband has witness. She has fallen during these episodes but does not lose complete consciousness. Daughter is concerned that her mother has a family history of a brain tumor in her mother and a history of hear arrhythmias, TIAs and sudden death in her father. She would like a referral to Dr. Jens Som.  ROS:   See pertinent positives and negatives per HPI.  Patient Active Problem List   Diagnosis Date Noted  . Memory disorder 03/01/2019  . Medicare annual wellness visit, subsequent 03/21/2017  . Routine adult health maintenance 03/21/2017  . History of atrial fibrillation 11/26/2015  . Edema 11/26/2015  . Right leg swelling 02/06/2014  . LBP (low back pain) 12/06/2012  . Osteopenia 12/06/2012  . Vitamin D deficiency 12/06/2012  . Malignant neoplasm of colon (HCC) 09/08/2009  . ANEMIA 09/08/2009  . Venous (peripheral) insufficiency 09/08/2009  . GERD 09/08/2009  . UTI 09/08/2009  . Osteoarthritis 09/08/2009  . OTHER DRUG ALLERGY 09/08/2009  . ATRIAL FIBRILLATION 08/21/2009  . Hyperlipidemia 10/30/2008  .  Anxiety state 10/30/2008  . Fibromyalgia 10/30/2008  . SCOLIOSIS 10/30/2008  . POLIOMYELITIS, HX OF 10/30/2008    Social History   Tobacco Use  . Smoking status: Never Smoker  . Smokeless tobacco: Never Used  Substance Use Topics  . Alcohol use: No    Current Outpatient Medications:  .  alendronate (FOSAMAX) 70 MG tablet, Take 70 mg by mouth once a week., Disp: , Rfl:  .  cholecalciferol (VITAMIN D) 1000 units tablet, Take 1,000 Units by mouth daily., Disp: , Rfl:  .  loratadine (CLARITIN) 10 MG tablet, Take by mouth as needed. , Disp: , Rfl:  .  Multiple Vitamins-Minerals (MULTIPLE VITAMINS/WOMENS PO), Take by mouth., Disp: , Rfl:  .  Acetaminophen (TYLENOL ARTHRITIS EXT RELIEF PO), Take by mouth. (Patient not taking: Reported on 06/30/2020), Disp: , Rfl:  .  acetaminophen (TYLENOL) 325 MG tablet, Take 650 mg by mouth. 1 per day (Patient not taking: No sig reported), Disp: , Rfl:  .  Magnesium 100 MG CAPS, Take by mouth. (Patient not taking: Reported on 06/30/2020), Disp: , Rfl:  .  memantine (NAMENDA) 10 MG tablet, Take 1 tablet (10 mg total) by mouth 2 (two) times daily. (Patient not taking: No sig reported), Disp: 180 tablet, Rfl: 3  Allergies  Allergen Reactions  . Sulfa Antibiotics Anaphylaxis    REACTION: elevated HR and BP  . Alendronate Other (See Comments)    "twitching" "twitching"  . Ampicillin     REACTION: rash and high fever  . Aspirin     REACTION: ears ringing  . Cyclobenzaprine Hcl  REACTION: nightmares  . Metaxalone     REACTION: nightmares  . Nitrofurantoin     REACTION: elevated BP and chest pain  . Nsaids     REACTION: rash  . Penicillins     REACTION: rash, high fever  . Sulfonamide Derivatives     REACTION: elevated HR and BP  . Aricept [Donepezil] Rash    Dizziness and cramping  . Nortriptyline Hcl Rash    Objective:   Ht 5' (1.524 m)   Wt 118 lb (53.5 kg)   BMI 23.05 kg/m   Patient is well-developed, well-nourished in no acute  distress.  Resting comfortably at home.  Head is normocephalic, atraumatic.  No labored breathing.  Speech is clear and coherent with logical content.  Patient is alert and oriented at baseline.   Assessment and Plan:     Mirola was seen today for cough and loss of consciousness.  Diagnoses and all orders for this visit:  Viral upper respiratory infection  Near syncope -     Ambulatory referral to Cardiology  Advised daughter to take her mother to the ED if she has another near syncopal episode. I will make a referral to Cardiology to see Dr. Stanford Breed. Consider CT scan of the head if cardiac work-up is negative   Kennyth Arnold, FNP 06/30/2020

## 2020-07-01 ENCOUNTER — Telehealth: Payer: Self-pay

## 2020-07-01 NOTE — Telephone Encounter (Signed)
Patient's daughter calling to clarify if pt will see cardiology first or is to schedule an appointment with Dr. Salena Saner and then follow up with Cardiologist.  Please advise.

## 2020-07-02 ENCOUNTER — Inpatient Hospital Stay (HOSPITAL_COMMUNITY)
Admission: EM | Admit: 2020-07-02 | Discharge: 2020-07-05 | DRG: 312 | Disposition: A | Discharge status: Home / Self Care | Payer: Medicare Other | Attending: Internal Medicine | Admitting: Internal Medicine

## 2020-07-02 ENCOUNTER — Encounter (HOSPITAL_COMMUNITY): Payer: Self-pay

## 2020-07-02 ENCOUNTER — Emergency Department (HOSPITAL_COMMUNITY): Payer: Medicare Other

## 2020-07-02 DIAGNOSIS — R55 Syncope and collapse: Principal | ICD-10-CM | POA: Diagnosis present

## 2020-07-02 DIAGNOSIS — E785 Hyperlipidemia, unspecified: Secondary | ICD-10-CM | POA: Diagnosis not present

## 2020-07-02 DIAGNOSIS — K219 Gastro-esophageal reflux disease without esophagitis: Secondary | ICD-10-CM | POA: Diagnosis present

## 2020-07-02 DIAGNOSIS — Z85038 Personal history of other malignant neoplasm of large intestine: Secondary | ICD-10-CM

## 2020-07-02 DIAGNOSIS — R52 Pain, unspecified: Secondary | ICD-10-CM | POA: Diagnosis not present

## 2020-07-02 DIAGNOSIS — M797 Fibromyalgia: Secondary | ICD-10-CM | POA: Diagnosis not present

## 2020-07-02 DIAGNOSIS — M25552 Pain in left hip: Secondary | ICD-10-CM | POA: Diagnosis not present

## 2020-07-02 DIAGNOSIS — G309 Alzheimer's disease, unspecified: Secondary | ICD-10-CM | POA: Diagnosis present

## 2020-07-02 DIAGNOSIS — Z043 Encounter for examination and observation following other accident: Secondary | ICD-10-CM | POA: Diagnosis not present

## 2020-07-02 DIAGNOSIS — U071 COVID-19: Secondary | ICD-10-CM | POA: Diagnosis not present

## 2020-07-02 DIAGNOSIS — Z90710 Acquired absence of both cervix and uterus: Secondary | ICD-10-CM

## 2020-07-02 DIAGNOSIS — R519 Headache, unspecified: Secondary | ICD-10-CM | POA: Diagnosis not present

## 2020-07-02 DIAGNOSIS — M25561 Pain in right knee: Secondary | ICD-10-CM | POA: Diagnosis not present

## 2020-07-02 DIAGNOSIS — W19XXXA Unspecified fall, initial encounter: Secondary | ICD-10-CM | POA: Diagnosis not present

## 2020-07-02 DIAGNOSIS — S79912A Unspecified injury of left hip, initial encounter: Secondary | ICD-10-CM | POA: Diagnosis not present

## 2020-07-02 DIAGNOSIS — M069 Rheumatoid arthritis, unspecified: Secondary | ICD-10-CM | POA: Diagnosis not present

## 2020-07-02 DIAGNOSIS — R0602 Shortness of breath: Secondary | ICD-10-CM

## 2020-07-02 DIAGNOSIS — F0280 Dementia in other diseases classified elsewhere without behavioral disturbance: Secondary | ICD-10-CM | POA: Diagnosis present

## 2020-07-02 DIAGNOSIS — M542 Cervicalgia: Secondary | ICD-10-CM | POA: Diagnosis not present

## 2020-07-02 LAB — CBC
HCT: 42.7 % (ref 36.0–46.0)
Hemoglobin: 13.4 g/dL (ref 12.0–15.0)
MCH: 28 pg (ref 26.0–34.0)
MCHC: 31.4 g/dL (ref 30.0–36.0)
MCV: 89.1 fL (ref 80.0–100.0)
Platelets: 186 10*3/uL (ref 150–400)
RBC: 4.79 MIL/uL (ref 3.87–5.11)
RDW: 13.2 % (ref 11.5–15.5)
WBC: 5.8 10*3/uL (ref 4.0–10.5)
nRBC: 0 % (ref 0.0–0.2)

## 2020-07-02 LAB — BASIC METABOLIC PANEL
Anion gap: 10 (ref 5–15)
BUN: 7 mg/dL — ABNORMAL LOW (ref 8–23)
CO2: 25 mmol/L (ref 22–32)
Calcium: 8.7 mg/dL — ABNORMAL LOW (ref 8.9–10.3)
Chloride: 99 mmol/L (ref 98–111)
Creatinine, Ser: 0.92 mg/dL (ref 0.44–1.00)
GFR, Estimated: >60 mL/min (ref >60)
Glucose, Bld: 100 mg/dL — ABNORMAL HIGH (ref 70–99)
Potassium: 3.9 mmol/L (ref 3.5–5.1)
Sodium: 134 mmol/L — ABNORMAL LOW (ref 135–145)

## 2020-07-02 NOTE — ED Notes (Signed)
C-Collar removed. CT images are negative. Pt is very uncomfortable with it on.

## 2020-07-02 NOTE — ED Triage Notes (Signed)
Pt from home with ems for a fall. Pt hit the back of hear head on the floor, no LOC. Pt c.o pain to her right knee and left hip. VSS Pt a.o, does not take any blood thinners

## 2020-07-03 ENCOUNTER — Observation Stay (HOSPITAL_COMMUNITY): Payer: Medicare Other

## 2020-07-03 ENCOUNTER — Encounter (HOSPITAL_COMMUNITY): Payer: Self-pay | Admitting: Internal Medicine

## 2020-07-03 DIAGNOSIS — R55 Syncope and collapse: Principal | ICD-10-CM

## 2020-07-03 DIAGNOSIS — K219 Gastro-esophageal reflux disease without esophagitis: Secondary | ICD-10-CM | POA: Diagnosis present

## 2020-07-03 DIAGNOSIS — U071 COVID-19: Secondary | ICD-10-CM | POA: Diagnosis not present

## 2020-07-03 DIAGNOSIS — F0280 Dementia in other diseases classified elsewhere without behavioral disturbance: Secondary | ICD-10-CM | POA: Diagnosis present

## 2020-07-03 DIAGNOSIS — M069 Rheumatoid arthritis, unspecified: Secondary | ICD-10-CM | POA: Diagnosis present

## 2020-07-03 DIAGNOSIS — G309 Alzheimer's disease, unspecified: Secondary | ICD-10-CM | POA: Diagnosis present

## 2020-07-03 DIAGNOSIS — Z90710 Acquired absence of both cervix and uterus: Secondary | ICD-10-CM | POA: Diagnosis not present

## 2020-07-03 DIAGNOSIS — Z85038 Personal history of other malignant neoplasm of large intestine: Secondary | ICD-10-CM | POA: Diagnosis not present

## 2020-07-03 DIAGNOSIS — E785 Hyperlipidemia, unspecified: Secondary | ICD-10-CM | POA: Diagnosis present

## 2020-07-03 DIAGNOSIS — M797 Fibromyalgia: Secondary | ICD-10-CM | POA: Diagnosis present

## 2020-07-03 LAB — CBC
HCT: 38.1 % (ref 36.0–46.0)
Hemoglobin: 12.1 g/dL (ref 12.0–15.0)
MCH: 27.9 pg (ref 26.0–34.0)
MCHC: 31.8 g/dL (ref 30.0–36.0)
MCV: 88 fL (ref 80.0–100.0)
Platelets: 166 10*3/uL (ref 150–400)
RBC: 4.33 MIL/uL (ref 3.87–5.11)
RDW: 13.2 % (ref 11.5–15.5)
WBC: 3.4 10*3/uL — ABNORMAL LOW (ref 4.0–10.5)
nRBC: 0 % (ref 0.0–0.2)

## 2020-07-03 LAB — TSH: TSH: 0.558 u[IU]/mL (ref 0.350–4.500)

## 2020-07-03 LAB — URINALYSIS, ROUTINE W REFLEX MICROSCOPIC
Bilirubin Urine: NEGATIVE
Glucose, UA: NEGATIVE mg/dL
Hgb urine dipstick: NEGATIVE
Ketones, ur: NEGATIVE mg/dL
Leukocytes,Ua: NEGATIVE
Nitrite: NEGATIVE
Protein, ur: NEGATIVE mg/dL
Specific Gravity, Urine: 1.004 — ABNORMAL LOW (ref 1.005–1.030)
pH: 7 (ref 5.0–8.0)

## 2020-07-03 LAB — TROPONIN I (HIGH SENSITIVITY)
Troponin I (High Sensitivity): 8 ng/L (ref <18)
Troponin I (High Sensitivity): 7 ng/L (ref <18)
Troponin I (High Sensitivity): 7 ng/L (ref <18)

## 2020-07-03 LAB — CREATININE, SERUM
Creatinine, Ser: 0.77 mg/dL (ref 0.44–1.00)
GFR, Estimated: >60 mL/min (ref >60)

## 2020-07-03 LAB — LACTATE DEHYDROGENASE: LDH: 187 U/L (ref 98–192)

## 2020-07-03 LAB — D-DIMER, QUANTITATIVE: D-Dimer, Quant: 0.48 ug/mL-FEU (ref 0.00–0.50)

## 2020-07-03 LAB — RESP PANEL BY RT-PCR (FLU A&B, COVID) ARPGX2
Influenza A by PCR: NEGATIVE
Influenza B by PCR: NEGATIVE
SARS Coronavirus 2 by RT PCR: POSITIVE — AB

## 2020-07-03 LAB — FERRITIN: Ferritin: 180 ng/mL (ref 11–307)

## 2020-07-03 LAB — C-REACTIVE PROTEIN: CRP: 1.2 mg/dL — ABNORMAL HIGH (ref <1.0)

## 2020-07-03 MED ORDER — SODIUM CHLORIDE 0.9 % IV SOLN: 100.0000 mg | Freq: Every day | INTRAVENOUS | Status: DC

## 2020-07-03 MED ORDER — ACETAMINOPHEN 650 MG RE SUPP: 650.0000 mg | Freq: Four times a day (QID) | RECTAL | Status: DC | PRN

## 2020-07-03 MED ORDER — ACETAMINOPHEN 500 MG PO TABS: 500.0000 mg | ORAL_TABLET | Freq: Four times a day (QID) | ORAL | Status: DC | PRN

## 2020-07-03 MED ORDER — ACETAMINOPHEN 325 MG PO TABS: 650.0000 mg | ORAL_TABLET | Freq: Four times a day (QID) | ORAL | Status: DC | PRN

## 2020-07-03 MED ORDER — METHYLPREDNISOLONE SODIUM SUCC 40 MG IJ SOLR
0.5000 mg/kg | Freq: Two times a day (BID) | INTRAMUSCULAR | Status: DC
  Administered 2020-07-03: 26.8 mg via INTRAVENOUS
  Filled 2020-07-03: qty 1

## 2020-07-03 MED ORDER — LORATADINE 10 MG PO TABS
10.0000 mg | ORAL_TABLET | Freq: Every day | ORAL | Status: DC
  Administered 2020-07-03 – 2020-07-04 (×2): 10 mg via ORAL
  Filled 2020-07-03 (×2): qty 1

## 2020-07-03 MED ORDER — ENOXAPARIN SODIUM 40 MG/0.4ML ~~LOC~~ SOLN
40.0000 mg | SUBCUTANEOUS | Status: DC
  Administered 2020-07-03 – 2020-07-05 (×3): 40 mg via SUBCUTANEOUS
  Filled 2020-07-03 (×3): qty 0.4

## 2020-07-03 MED ORDER — SODIUM CHLORIDE 0.9 % IV SOLN
200.0000 mg | Freq: Once | INTRAVENOUS | Status: AC
  Administered 2020-07-03: 200 mg via INTRAVENOUS
  Filled 2020-07-03: qty 40

## 2020-07-03 MED ORDER — CALCIUM CARBONATE ANTACID 500 MG PO CHEW: 1.0000 | CHEWABLE_TABLET | Freq: Three times a day (TID) | ORAL | Status: DC | PRN

## 2020-07-03 MED ORDER — SODIUM CHLORIDE 0.9 % IV SOLN
100.0000 mg | Freq: Every day | INTRAVENOUS | Status: AC
  Administered 2020-07-04 – 2020-07-05 (×2): 100 mg via INTRAVENOUS
  Filled 2020-07-03: qty 100
  Filled 2020-07-03: qty 20

## 2020-07-03 MED ORDER — PREDNISONE 20 MG PO TABS: 50.0000 mg | ORAL_TABLET | Freq: Every day | ORAL | Status: DC

## 2020-07-03 NOTE — ED Notes (Signed)
Lunch Tray Ordered @ 1114.  

## 2020-07-03 NOTE — Plan of Care (Signed)
  Problem: Clinical Measurements: Goal: Respiratory complications will improve Outcome: Progressing   Problem: Clinical Measurements: Goal: Cardiovascular complication will be avoided Outcome: Progressing   Problem: Elimination: Goal: Will not experience complications related to bowel motility Outcome: Progressing Goal: Will not experience complications related to urinary retention Outcome: Progressing   Problem: Pain Managment: Goal: General experience of comfort will improve Outcome: Progressing   Problem: Safety: Goal: Ability to remain free from injury will improve Outcome: Progressing   Problem: Respiratory: Goal: Will maintain a patent airway Outcome: Progressing Goal: Complications related to the disease process, condition or treatment will be avoided or minimized Outcome: Progressing

## 2020-07-03 NOTE — ED Provider Notes (Signed)
[facility] EMERGENCY DEPARTMENT Provider Note   CSN: [id number] Arrival date & time: 07/02/20  1102     History Chief Complaint  Patient presents with  . Fall    Brenda Hammond is a 87 y.o. female.  87 y/o female with hx of colon CA (bowel resection complicated by episode of Afib), HLD, fibromyalgia, rheumatoid arthritis presents to the ED from home after a fall. Patient states she was cleaning up in the kitchen after breakfast when she somehow fell and ended up on the floor. Was reported to have struck the back of her head. Unclear LOC. C/o R knee pain and L hip pain. No medications taken for pain PTA. Not chronically anticoagulated. Denies known preceding CP, SOB. Has not experiences N/V since head injury.  Per daughter, patient has been experiencing episodes of recurrent syncope and near syncope over the past week. Occasionally has had some prodromal lightheadedness prior to her passing out. This first occurred on 07/25/19 with a second syncopal event on 07/28/19. Had an episode of lighteadedness/near syncope that same afternoon (07/28/19). Daughter endorsing additional episode on Saturday, 06/29/19. She has not had any recent medication changes. Patient does state that her appetite and PO intake have been fairly poor. She was scheduled to see Cardiology at the end of the month for syncope evaluation, but was advised to come to the ED today after her event today.       Past Medical History:  Diagnosis Date  . Anemia   . Anxiety   . Atrial fibrillation (HCC)   . Colon adenocarcinoma (HCC)   . DJD (degenerative joint disease)   . Fibromyalgia   . GERD (gastroesophageal reflux disease)   . History of poliomyelitis   . History of UTI   . Hyperlipemia   . Memory disorder 03/01/2019  . Other drug allergy(995.27)   . Scoliosis   . Surgical or other procedure not carried out because of patient's decision   . Venous insufficiency     Patient Active Problem  List   Diagnosis Date Noted  . Memory disorder 03/01/2019  . Medicare annual wellness visit, subsequent 03/21/2017  . Routine adult health maintenance 03/21/2017  . History of atrial fibrillation 11/26/2015  . Edema 11/26/2015  . Right leg swelling 02/06/2014  . LBP (low back pain) 12/06/2012  . Osteopenia 12/06/2012  . Vitamin D deficiency 12/06/2012  . Malignant neoplasm of colon (HCC) 09/08/2009  . ANEMIA 09/08/2009  . Venous (peripheral) insufficiency 09/08/2009  . GERD 09/08/2009  . UTI 09/08/2009  . Osteoarthritis 09/08/2009  . OTHER DRUG ALLERGY 09/08/2009  . ATRIAL FIBRILLATION 08/21/2009  . Hyperlipidemia 10/30/2008  . Anxiety state 10/30/2008  . Fibromyalgia 10/30/2008  . SCOLIOSIS 10/30/2008  . POLIOMYELITIS, HX OF 10/30/2008    Past Surgical History:  Procedure Laterality Date  . APPENDECTOMY    . LOW ANTERIOR BOWEL RESECTION  06/2009   with anastomosis for colon cancer  . OTHER SURGICAL HISTORY     T & A  . TOTAL ABDOMINAL HYSTERECTOMY W/ BILATERAL SALPINGOOPHORECTOMY  1980s     OB History   No obstetric history on file.     Family History  Problem Relation Age of Onset  . Stroke Father   . Cancer Mother        brain  . Colon cancer Neg Hx     Social History   Tobacco Use  . Smoking status: Never Smoker  . Smokeless tobacco: Never Used  Substance Use Topics  .  Alcohol use: No  . Drug use: No    Home Medications Prior to Admission medications   Medication Sig Start Date End Date Taking? Authorizing Provider  Acetaminophen (TYLENOL ARTHRITIS EXT RELIEF PO) Take 650 mg by mouth 2 (two) times daily as needed (pain).   Yes [provider]  alendronate (FOSAMAX) 70 MG tablet Take 70 mg by mouth every Tuesday. 03/31/20  Yes [provider]  Calcium Carbonate (CALCIUM 600 PO) Take 1 tablet by mouth daily.   Yes [provider]  calcium carbonate (TUMS - DOSED IN MG ELEMENTAL CALCIUM) 500 MG chewable tablet Chew 1 tablet by  mouth daily as needed for indigestion or heartburn.   Yes [provider]  cholecalciferol (VITAMIN D) 1000 units tablet Take 1,000 Units by mouth daily.   Yes [provider]  loratadine (CLARITIN) 10 MG tablet Take 10 mg by mouth as needed for allergies or rhinitis.   Yes [provider]  Menthol (HALLS COUGH DROPS) 5.8 MG LOZG Use as directed 1 lozenge in the mouth or throat daily as needed (cough).   Yes [provider]    Allergies    Sulfa antibiotics, Alendronate, Ampicillin, Aspirin, Cyclobenzaprine hcl, Metaxalone, Nitrofurantoin, Nsaids, Penicillins, Sulfonamide derivatives, Aricept [donepezil], and Nortriptyline hcl  Review of Systems   Review of Systems  Ten systems reviewed and are negative for acute change, except as noted in the HPI.    Physical Exam Updated Vital Signs BP (!) 133/59   Pulse 70   Temp 97.6 F (36.4 C) (Oral)   Resp (!) 22   SpO2 95%   Physical Exam Vitals and nursing note reviewed.  Constitutional:      General: She is not in acute distress.    Appearance: She is well-developed and well-nourished. She is not diaphoretic.     Comments: Thin, frail. Nontoxic appearing and in NAD.  HENT:     Head: Normocephalic and atraumatic.     Comments: No battle sign or raccoon's eyes.    Right Ear: External ear normal.     Left Ear: External ear normal.  Eyes:     General: No scleral icterus.    Extraocular Movements: Extraocular movements intact and EOM normal.     Conjunctiva/sclera: Conjunctivae normal.  Cardiovascular:     Rate and Rhythm: Normal rate and regular rhythm.     Pulses: Normal pulses.  Pulmonary:     Effort: Pulmonary effort is normal. No respiratory distress.     Breath sounds: No stridor. No wheezing or rales.     Comments: Respirations even and unlabored Abdominal:     Comments: Soft, nontender, nondistended abdomen  Musculoskeletal:        General: Normal range of motion.     Cervical back:  Normal range of motion.     Comments: No bony deformities, step-offs, crepitus to the cervical, thoracic, lumbosacral midline. No associated tenderness to palpation. No leg shortening or malrotation. No BLE edema.  Skin:    General: Skin is warm and dry.     Coloration: Skin is not pale.     Findings: No erythema or rash.  Neurological:     Mental Status: She is alert and oriented to person, place, and time.     Cranial Nerves: No cranial nerve deficit.     Coordination: Coordination normal.     Comments: GCS 15. Speech is clear, goal oriented. Moving all extremities spontaneously.  Psychiatric:        Mood and  Affect: Mood and affect normal.        Behavior: Behavior normal.     ED Results / Procedures / Treatments   Labs (all labs ordered are listed, but only abnormal results are displayed) Labs Reviewed  BASIC METABOLIC PANEL - Abnormal; Notable for the following components:      Result Value   Sodium 134 (*)    Glucose, Bld 100 (*)    BUN 7 (*)    Calcium 8.7 (*)    All other components within normal limits  RESP PANEL BY RT-PCR (FLU A&B, COVID) ARPGX2  URINE CULTURE  CBC  URINALYSIS, ROUTINE W REFLEX MICROSCOPIC  TROPONIN I (HIGH SENSITIVITY)    EKG EKG Interpretation  Date/Time:  Wednesday July 02 2020 11:58:38 EST Ventricular Rate:  71 PR Interval:  88 QRS Duration: 96 QT Interval:  420 QTC Calculation: 456 R Axis:   -20 Text Interpretation: Sinus rhythm with short PR Nonspecific ST and T wave abnormality Abnormal ECG Confirmed by Thayer Jew [phone]) on 07/03/2020 2:56:01 AM   Radiology CT Head Wo Contrast  Result Date: 07/02/2020 CLINICAL DATA:  Pain following fall EXAM: CT HEAD WITHOUT CONTRAST CT CERVICAL SPINE WITHOUT CONTRAST TECHNIQUE: Multidetector CT imaging of the head and cervical spine was performed following the standard protocol without intravenous contrast. Multiplanar CT image reconstructions of the cervical spine were also generated.  COMPARISON:  None. FINDINGS: CT HEAD FINDINGS Brain: Moderate diffuse atrophy present. There is no intracranial mass, hemorrhage, extra-axial fluid collection, or midline shift. There is patchy small vessel disease in the centra semiovale bilaterally. No acute appearing infarct evident. Vascular: No hyperdense vessel. Basilar artery appears tortuous. Carotid siphon arterial vascular calcification noted. Skull: The bony calvarium appears intact. Sinuses/Orbits: There is mucosal thickening in the right maxillary antrum. There is mucosal thickening and opacification in several ethmoid air cells. There is mucosal thickening in the posterior right frontal sinus. Orbits appear symmetric bilaterally. Other: Mastoid air cells are clear. CT CERVICAL SPINE FINDINGS Alignment: There is 1 mm of retrolisthesis of C3 on C4. There is 1 mm of retrolisthesis of C4 on C5. There is 1 mm of retrolisthesis of C5 on C6. There is 1 mm of anterolisthesis of T1 on T2. Skull base and vertebrae: Skull base and craniocervical junction regions appear normal. There is pannus posterior to the odontoid, not causing significant impression on the underlying craniocervical junction. There is a degree of underlying osteoporosis. No acute fracture evident. No blastic or lytic bone lesions. Soft tissues and spinal canal: Prevertebral soft tissues and predental space regions are normal. There is no appreciable cord or canal hematoma. No paraspinous lesions are evident. Disc levels: There is moderately severe disc space narrowing and C3-4 with moderate disc space narrowing at C4-5 and C5-6. There is facet hypertrophy at multiple levels bilaterally. There is no frank disc extrusion or stenosis. No appreciable nerve root edema or effacement. Upper chest: Areas of atelectasis and apparent scarring noted in the upper lobe regions. No apical pneumothorax evident. Other: None IMPRESSION: CT head: Atrophy with periventricular small vessel disease. No mass or  hemorrhage. No evident acute infarct. There are foci of arterial vascular calcification. There is paranasal sinus disease at several sites. CT cervical spine: Underlying osteoporosis. No acute fracture. Slight spondylolisthesis at several levels is felt to be due to underlying spondylosis. Osteoarthritic change at several levels noted. No disc extrusion or stenosis. Scarring and atelectasis noted in the upper lung regions, more on the left than on the right.  Electronically Signed   By: Lowella Grip III M.D.   On: 07/02/2020 13:05   CT Cervical Spine Wo Contrast  Result Date: 07/02/2020 CLINICAL DATA:  Pain following fall EXAM: CT HEAD WITHOUT CONTRAST CT CERVICAL SPINE WITHOUT CONTRAST TECHNIQUE: Multidetector CT imaging of the head and cervical spine was performed following the standard protocol without intravenous contrast. Multiplanar CT image reconstructions of the cervical spine were also generated. COMPARISON:  None. FINDINGS: CT HEAD FINDINGS Brain: Moderate diffuse atrophy present. There is no intracranial mass, hemorrhage, extra-axial fluid collection, or midline shift. There is patchy small vessel disease in the centra semiovale bilaterally. No acute appearing infarct evident. Vascular: No hyperdense vessel. Basilar artery appears tortuous. Carotid siphon arterial vascular calcification noted. Skull: The bony calvarium appears intact. Sinuses/Orbits: There is mucosal thickening in the right maxillary antrum. There is mucosal thickening and opacification in several ethmoid air cells. There is mucosal thickening in the posterior right frontal sinus. Orbits appear symmetric bilaterally. Other: Mastoid air cells are clear. CT CERVICAL SPINE FINDINGS Alignment: There is 1 mm of retrolisthesis of C3 on C4. There is 1 mm of retrolisthesis of C4 on C5. There is 1 mm of retrolisthesis of C5 on C6. There is 1 mm of anterolisthesis of T1 on T2. Skull base and vertebrae: Skull base and craniocervical junction  regions appear normal. There is pannus posterior to the odontoid, not causing significant impression on the underlying craniocervical junction. There is a degree of underlying osteoporosis. No acute fracture evident. No blastic or lytic bone lesions. Soft tissues and spinal canal: Prevertebral soft tissues and predental space regions are normal. There is no appreciable cord or canal hematoma. No paraspinous lesions are evident. Disc levels: There is moderately severe disc space narrowing and C3-4 with moderate disc space narrowing at C4-5 and C5-6. There is facet hypertrophy at multiple levels bilaterally. There is no frank disc extrusion or stenosis. No appreciable nerve root edema or effacement. Upper chest: Areas of atelectasis and apparent scarring noted in the upper lobe regions. No apical pneumothorax evident. Other: None IMPRESSION: CT head: Atrophy with periventricular small vessel disease. No mass or hemorrhage. No evident acute infarct. There are foci of arterial vascular calcification. There is paranasal sinus disease at several sites. CT cervical spine: Underlying osteoporosis. No acute fracture. Slight spondylolisthesis at several levels is felt to be due to underlying spondylosis. Osteoarthritic change at several levels noted. No disc extrusion or stenosis. Scarring and atelectasis noted in the upper lung regions, more on the left than on the right. Electronically Signed   By: Lowella Grip III M.D.   On: 07/02/2020 13:05   DG Knee Complete 4 Views Right  Result Date: 07/02/2020 CLINICAL DATA:  Fall at home with right posterior knee pain. Initial encounter. EXAM: RIGHT KNEE - COMPLETE 4+ VIEW COMPARISON:  None. FINDINGS: Artifact from overlapping bedding. No acute fracture, subluxation, or joint effusion. Degenerative marginal spurring and generalized osteopenia. Senile chondrocalcinosis. IMPRESSION: No acute finding. Electronically Signed   By: Monte Fantasia M.D.   On: 07/02/2020 11:53   DG  Hip Unilat W or Wo Pelvis 2-3 Views Left  Result Date: 07/02/2020 CLINICAL DATA:  Fall. EXAM: DG HIP (WITH OR WITHOUT PELVIS) 2-3V LEFT COMPARISON:  CT 02/27/2014. FINDINGS: Diffuse osteopenia. Degenerative changes lumbar spine, both SI joints, and both hips. No acute bony abnormality. Surgical sutures noted the pelvis. Large amount of stool noted throughout the colon. IMPRESSION: 1. Diffuse osteopenia. Degenerative changes lumbar spine and both hips. No acute  bony abnormality. 2. Large amount of stool noted throughout the colon. Electronically Signed   By: Marcello Moores  Register   On: 07/02/2020 11:54    Procedures Procedures (including critical care time)  Medications Ordered in ED Medications  acetaminophen (TYLENOL) tablet 500 mg (has no administration in time range)  loratadine (CLARITIN) tablet 10 mg (has no administration in time range)  calcium carbonate (TUMS - dosed in mg elemental calcium) chewable tablet 200 mg of elemental calcium (has no administration in time range)    ED Course  I have reviewed the triage vital signs and the nursing notes.  Pertinent labs & imaging results that were available during my care of the patient were reviewed by me and considered in my medical decision making (see chart for details).  Clinical Course as of 07/03/20 0157  Thu Jul 03, 2020  0157 Dr. Hal Hope to admit. [KH]    Clinical Course User Index [KH] Beverely Pace   MDM Rules/Calculators/A&P                          87 year old female presents to the emergency department after a fall at home today.  It is unclear what precipitated her fall, but her evaluation from a trauma perspective has been reassuring.  After further discussion with the daughter, patient has had 4-5 other episodes of syncope/near syncope in the past week and a half.  Denies CP/SOB.  Would benefit from further telemetry monitoring, echocardiogram.  Orthostatic VS ordered.  Dr. Hal Hope to assess in the ED for  admission.   Final Clinical Impression(s) / ED Diagnoses Final diagnoses:  Syncope and collapse    Rx / DC Orders ED Discharge Orders    None       Antonietta Breach, PA-C 07/03/20 0256    Merryl Hacker, MD 07/03/20 [phone]

## 2020-07-03 NOTE — ED Notes (Signed)
Attempted to give reportx1 

## 2020-07-03 NOTE — Consult Note (Addendum)
Cardiology Consultation:   Due to the COVID-19 pandemic, this visit was completed with telemedicine (audio/video) technology to reduce patient and provider exposure as well as to preserve personal protective equipment.   Patient ID: Brenda Hammond MRN: [id number]; DOB: [birth year]  Admit date: 07/02/2020 Date of Consult: 07/03/2020  Primary Care Provider: Ronnald Nian, DO Primary Cardiologist: New  Patient Profile:   Brenda Hammond is a 87 y.o. female with a hx of post op Afib 2011, colon cancer, GERD, HLD, RA who is being seen today for the evaluation of syncope in setting of COVID positive  at the request of Dr. Benny Lennert.   Hx of afib in 2011 after colon cancer resection. She converted with amiodarone. Echo with normal LVEF. Seen by Dr. Haroldine Laws and as needed follow up recommended.   History of Present Illness:   [name] recently had severe near syncope episodes. Few witnessed by husband. Fall with near syncope episode but no LOC. Seen by PCP via telemedicine and referral made to see Dr. Stanford Breed per daughter's request (husband see's Dr. Stanford Breed).   First episode 12/27 while standing in kitchen, felt dizzy. Witnessed by husband. No LOC.  2nd episode 12/30, found on bathroom floor by husband. Had abdominal discomfort. Pt does not recall event.  3rd episode 1/1 while playing cards (pt was sitting)  Last episode 1/5 while standing in kitchen. No prodromal symptoms. Found on floor. Husband and daughter at home but did not witnessed event.   Daughter is a cardiac nurse at [address] who provided history.  Patient with history of Alzheimer dementia but very independent at home.  Able to do ADLs.  She walks every single day without chest pain or shortness of breath.  Patient never complained of any chest pain, shortness of breath, orthopnea, PND, lower extremity edema or palpitation.  Patient been feeling fatigue and tired for past few days.  She is not vaccinated.   Denies fever, chills or COVID exposure.  She has been coughing green phlegm for past few days.  Potassium 3.9 Creatinine normal Hemoglobin 13.4 Unremarkable CT of head and cervical spine COVID positive  Minimal carotid plaque by ultrasound October 2020.  Past Medical History:  Diagnosis Date  . Anemia   . Anxiety   . Atrial fibrillation ([place])   . Colon adenocarcinoma ([place])   . DJD (degenerative joint disease)   . Fibromyalgia   . GERD (gastroesophageal reflux disease)   . History of poliomyelitis   . History of UTI   . Hyperlipemia   . Memory disorder 03/01/2019  . Other drug allergy(995.27)   . Scoliosis   . Surgical or other procedure not carried out because of patient's decision   . Venous insufficiency     Past Surgical History:  Procedure Laterality Date  . APPENDECTOMY    . LOW ANTERIOR BOWEL RESECTION  06/2009   with anastomosis for colon cancer  . OTHER SURGICAL HISTORY     T & A  . TOTAL ABDOMINAL HYSTERECTOMY W/ BILATERAL SALPINGOOPHORECTOMY  1980s   Inpatient Medications: Scheduled Meds: . enoxaparin (LOVENOX) injection  40 mg Subcutaneous Q24H  . loratadine  10 mg Oral QHS   Continuous Infusions: . [START ON 07/04/2020] remdesivir 100 mg in NS 100 mL     PRN Meds: acetaminophen **OR** acetaminophen, calcium carbonate  Allergies:    Allergies  Allergen Reactions  . Sulfa Antibiotics Anaphylaxis    REACTION: elevated HR and BP  . Alendronate Other (See Comments)    "  twitching" "twitching"  . Ampicillin     REACTION: rash and high fever  . Aspirin     REACTION: ears ringing  . Cyclobenzaprine Hcl     REACTION: nightmares  . Metaxalone     REACTION: nightmares  . Nitrofurantoin     REACTION: elevated BP and chest pain  . Nsaids     REACTION: rash  . Penicillins     REACTION: rash, high fever  . Sulfonamide Derivatives     REACTION: elevated HR and BP  . Aricept [Donepezil] Rash    Dizziness and cramping  . Nortriptyline Hcl Rash     Social History:   Social History   Socioeconomic History  . Marital status: Married    Spouse name: Winniefred Capurso  . Number of children: 1  . Years of education: 86  . Highest education level: Not on file  Occupational History  . Occupation: Office manager    Comment: Works part time    Employer: [employer]  Tobacco Use  . Smoking status: Never Smoker  . Smokeless tobacco: Never Used  Substance and Sexual Activity  . Alcohol use: No  . Drug use: No  . Sexual activity: Not on file  Other Topics Concern  . Not on file  Social History Narrative   Originally from [facility] and has lived with her husband in [place] since 1968.   Fun: Exercises regularly, [place] and have breakfast, walking.   Denies abuse and feels safe at home.       Right handed    Caffeine ~ 3 cups per day    Living with his daughter    Social Determinants of Health   Financial Resource Strain: Low Risk   . Difficulty of Paying Living Expenses: Not hard at all  Food Insecurity: No Food Insecurity  . Worried About Charity fundraiser in the Last Year: Never true  . Ran Out of Food in the Last Year: Never true  Transportation Needs: No Transportation Needs  . Lack of Transportation (Medical): No  . Lack of Transportation (Non-Medical): No  Physical Activity: Not on file  Stress: Not on file  Social Connections: Not on file  Intimate Partner Violence: Not on file    Family History:   Family History  Problem Relation Age of Onset  . Stroke Father   . Cancer Mother        brain  . Colon cancer Neg Hx      ROS:  Please see the history of present illness.  All other ROS reviewed and negative.     Physical Exam/Data:   Vitals:   07/03/20 1200 07/03/20 1300 07/03/20 1345 07/03/20 1400  BP: 136/64 (!) 146/65 (!) 141/62 137/68  Pulse: 70 67 61 64  Resp: (!) 24 (!) 23 (!) 23 (!) 24  Temp:      TempSrc:      SpO2: 96% 95% 96% 95%   No intake or output data in the 24 hours  ending 07/03/20 1613 Last 3 Weights 06/30/2020 04/17/2020 10/12/2019  Weight (lbs) 118 lb 118 lb 9.6 oz 127 lb 6.4 oz  Weight (kg) 53.524 kg 53.797 kg 57.788 kg     There is no height or weight on file to calculate BMI.   VITAL SIGNS:  reviewed GEN:  no acute distress PSYCH:  normal affect  EKG:  The EKG was personally reviewed and demonstrates:  Sinus rhythm with short PR interval  Telemetry:  Telemetry was personally  reviewed and demonstrates: Sinus rhythm, frequent PAC, short PR interval  Relevant CV Studies: As summarized above  Laboratory Data:  Chemistry Recent Labs  Lab 07/02/20 1218 07/03/20 0540  NA 134*  --   K 3.9  --   CL 99  --   CO2 25  --   GLUCOSE 100*  --   BUN 7*  --   CREATININE 0.92 0.77  CALCIUM 8.7*  --   GFRNONAA >60 >60  ANIONGAP 10  --   Hematology Recent Labs  Lab 07/02/20 1218 07/03/20 0540  WBC 5.8 3.4*  RBC 4.79 4.33  HGB 13.4 12.1  HCT 42.7 38.1  MCV 89.1 88.0  MCH 28.0 27.9  MCHC 31.4 31.8  RDW 13.2 13.2  PLT 186 166   DDimer  Recent Labs  Lab 07/03/20 0540  DDIMER 0.48    Radiology/Studies:  CT Head Wo Contrast  Result Date: 07/02/2020 CLINICAL DATA:  Pain following fall EXAM: CT HEAD WITHOUT CONTRAST CT CERVICAL SPINE WITHOUT CONTRAST TECHNIQUE: Multidetector CT imaging of the head and cervical spine was performed following the standard protocol without intravenous contrast. Multiplanar CT image reconstructions of the cervical spine were also generated. COMPARISON:  None. FINDINGS: CT HEAD FINDINGS Brain: Moderate diffuse atrophy present. There is no intracranial mass, hemorrhage, extra-axial fluid collection, or midline shift. There is patchy small vessel disease in the centra semiovale bilaterally. No acute appearing infarct evident. Vascular: No hyperdense vessel. Basilar artery appears tortuous. Carotid siphon arterial vascular calcification noted. Skull: The bony calvarium appears intact. Sinuses/Orbits: There is mucosal  thickening in the right maxillary antrum. There is mucosal thickening and opacification in several ethmoid air cells. There is mucosal thickening in the posterior right frontal sinus. Orbits appear symmetric bilaterally. Other: Mastoid air cells are clear. CT CERVICAL SPINE FINDINGS Alignment: There is 1 mm of retrolisthesis of C3 on C4. There is 1 mm of retrolisthesis of C4 on C5. There is 1 mm of retrolisthesis of C5 on C6. There is 1 mm of anterolisthesis of T1 on T2. Skull base and vertebrae: Skull base and craniocervical junction regions appear normal. There is pannus posterior to the odontoid, not causing significant impression on the underlying craniocervical junction. There is a degree of underlying osteoporosis. No acute fracture evident. No blastic or lytic bone lesions. Soft tissues and spinal canal: Prevertebral soft tissues and predental space regions are normal. There is no appreciable cord or canal hematoma. No paraspinous lesions are evident. Disc levels: There is moderately severe disc space narrowing and C3-4 with moderate disc space narrowing at C4-5 and C5-6. There is facet hypertrophy at multiple levels bilaterally. There is no frank disc extrusion or stenosis. No appreciable nerve root edema or effacement. Upper chest: Areas of atelectasis and apparent scarring noted in the upper lobe regions. No apical pneumothorax evident. Other: None IMPRESSION: CT head: Atrophy with periventricular small vessel disease. No mass or hemorrhage. No evident acute infarct. There are foci of arterial vascular calcification. There is paranasal sinus disease at several sites. CT cervical spine: Underlying osteoporosis. No acute fracture. Slight spondylolisthesis at several levels is felt to be due to underlying spondylosis. Osteoarthritic change at several levels noted. No disc extrusion or stenosis. Scarring and atelectasis noted in the upper lung regions, more on the left than on the right. Electronically Signed    By: Lowella Grip III M.D.   On: 07/02/2020 13:05   CT Cervical Spine Wo Contrast  Result Date: 07/02/2020 CLINICAL DATA:  Pain following fall  EXAM: CT HEAD WITHOUT CONTRAST CT CERVICAL SPINE WITHOUT CONTRAST TECHNIQUE: Multidetector CT imaging of the head and cervical spine was performed following the standard protocol without intravenous contrast. Multiplanar CT image reconstructions of the cervical spine were also generated. COMPARISON:  None. FINDINGS: CT HEAD FINDINGS Brain: Moderate diffuse atrophy present. There is no intracranial mass, hemorrhage, extra-axial fluid collection, or midline shift. There is patchy small vessel disease in the centra semiovale bilaterally. No acute appearing infarct evident. Vascular: No hyperdense vessel. Basilar artery appears tortuous. Carotid siphon arterial vascular calcification noted. Skull: The bony calvarium appears intact. Sinuses/Orbits: There is mucosal thickening in the right maxillary antrum. There is mucosal thickening and opacification in several ethmoid air cells. There is mucosal thickening in the posterior right frontal sinus. Orbits appear symmetric bilaterally. Other: Mastoid air cells are clear. CT CERVICAL SPINE FINDINGS Alignment: There is 1 mm of retrolisthesis of C3 on C4. There is 1 mm of retrolisthesis of C4 on C5. There is 1 mm of retrolisthesis of C5 on C6. There is 1 mm of anterolisthesis of T1 on T2. Skull base and vertebrae: Skull base and craniocervical junction regions appear normal. There is pannus posterior to the odontoid, not causing significant impression on the underlying craniocervical junction. There is a degree of underlying osteoporosis. No acute fracture evident. No blastic or lytic bone lesions. Soft tissues and spinal canal: Prevertebral soft tissues and predental space regions are normal. There is no appreciable cord or canal hematoma. No paraspinous lesions are evident. Disc levels: There is moderately severe disc space  narrowing and C3-4 with moderate disc space narrowing at C4-5 and C5-6. There is facet hypertrophy at multiple levels bilaterally. There is no frank disc extrusion or stenosis. No appreciable nerve root edema or effacement. Upper chest: Areas of atelectasis and apparent scarring noted in the upper lobe regions. No apical pneumothorax evident. Other: None IMPRESSION: CT head: Atrophy with periventricular small vessel disease. No mass or hemorrhage. No evident acute infarct. There are foci of arterial vascular calcification. There is paranasal sinus disease at several sites. CT cervical spine: Underlying osteoporosis. No acute fracture. Slight spondylolisthesis at several levels is felt to be due to underlying spondylosis. Osteoarthritic change at several levels noted. No disc extrusion or stenosis. Scarring and atelectasis noted in the upper lung regions, more on the left than on the right. Electronically Signed   By: Bretta Bang III M.D.   On: 07/02/2020 13:05   Portable chest 1 View  Result Date: 07/03/2020 CLINICAL DATA:  COVID positive EXAM: PORTABLE CHEST 1 VIEW COMPARISON:  11/26/2015 FINDINGS: Normal cardiac silhouette. Lungs are mildly hyperinflated. There is chronic bronchitic markings centrally. Scarring in the upper lobes is noted. No clear airspace disease present. No pneumothorax. No acute osseous abnormality. IMPRESSION: No pneumonia identified.  Chronic findings as above. Electronically Signed   By: Genevive Bi M.D.   On: 07/03/2020 09:33   DG Knee Complete 4 Views Right  Result Date: 07/02/2020 CLINICAL DATA:  Fall at home with right posterior knee pain. Initial encounter. EXAM: RIGHT KNEE - COMPLETE 4+ VIEW COMPARISON:  None. FINDINGS: Artifact from overlapping bedding. No acute fracture, subluxation, or joint effusion. Degenerative marginal spurring and generalized osteopenia. Senile chondrocalcinosis. IMPRESSION: No acute finding. Electronically Signed   By: Marnee Spring M.D.    On: 07/02/2020 11:53   DG Hip Unilat W or Wo Pelvis 2-3 Views Left  Result Date: 07/02/2020 CLINICAL DATA:  Fall. EXAM: DG HIP (WITH OR WITHOUT PELVIS) 2-3V LEFT COMPARISON:  CT 02/27/2014. FINDINGS: Diffuse osteopenia. Degenerative changes lumbar spine, both SI joints, and both hips. No acute bony abnormality. Surgical sutures noted the pelvis. Large amount of stool noted throughout the colon. IMPRESSION: 1. Diffuse osteopenia. Degenerative changes lumbar spine and both hips. No acute bony abnormality. 2. Large amount of stool noted throughout the colon. Electronically Signed   By: Marcello Moores  Register   On: 07/02/2020 11:54   Assessment and Plan:   1. Syncope/near syncope -Multiple episodes of syncope and near syncope in the past 10 days.  Some witnessed by husband.  No prodromal symptoms.  Episodes occur while standing and sitting.  She has been feeling fatigued and tired for past couple of days.  Now COVID-positive.  She is unvaccinated. -Patient has underlying Alzheimer's dementia but functional at home per daughter who is nurse. -Her episode could be due to untreated COVID. ? Asymptomatic afib.  -Check orthostatic -Continue to monitor on telemetry -Get echocardiogram -Check TSH -Likely outpatient monitor -Mild plaque on carotid by ultrasound in 2020.  2. COVID-19 -Per primary team  3.Post op afib in 2011 - Hx of afib in 2011 after colon cancer resection. She converted with amiodarone. Echo with normal LVEF.  -Telemetry with sinus rhythm, short PR interval and frequent PAC.  No evidence of atrial fibrillation.  For questions or updates, please contact [place] Please consult [email or web address] for contact info under   Jarrett Soho, PA  07/03/2020 4:13 PM   Hx and findings reviewed wht B Bhagat.   I have also discussed with daughter who is in room with patient    Exam deferred to avoid excess exposure since COVID +  Briefley pt is an 87 yo with remote afib after colon  surgery Presents with several episodes of presyncope / syncope over several days.  No real prodrome Review of exam by hospitalist is unremarkable     Initial labs unremarkable   Urine is dilute EKG with short PR but no frank preexcitation CXR with normal cardiac silhouetted  Patient admitted to telemetry Would check orhtostatic VS Would also recomm echo to define structure / function of the heart  No other recomm for now     WIll continue to follow  Dorris Carnes MD

## 2020-07-03 NOTE — Telephone Encounter (Signed)
Hey just checking to see which cardiologist patient was referred to on [address]?

## 2020-07-03 NOTE — H&P (Signed)
History and Physical    Brenda Hammond [id number] DOB: 12/29/1932 DOA: 07/02/2020  PCP: Ronnald Nian, DO   Patient coming from: Home.  Chief Complaint: Loss of consciousness.  HPI: Brenda Hammond is a 87 y.o. female with history of postop A. fib around 2011 and subsequently which patient has been off anticoagulation with history of colon cancer status post surgery being followed by [facility], memory impairment presently not on any medication followed by Dr. Jannifer Franklin neurologist had 3 episodes of loss of consciousness over the last 10 days.  The first episode happened around June 23, 2020 when patient was playing cards in a standing position when patient felt dizzy and lost consciousness for few minutes the exact duration was not known.  Patient did not have any chest pain shortness of breath or any incontinence of urine or bowel during the episode.  The second episode happened around June 26, 2020 when patient was waiting to go to the bathroom and had some abdominal discomfort at that time.  When patient's husband opened the bathrooms door patient was on the floor unconscious.  But about an hour and a half later after the incident patient did go to shopping with her daughter when patient did walk well.  Had no episodes at that time.  The last episode happened yesterday morning when patient was in the kitchen at this time patient had no prodromal symptoms and loss consciousness and was found on the floor.  Patient was brought to the ER.  ED Course: In the ER patient remains hemodynamically stable EKG shows normal sinus rhythm with short PR interval QTC of 456 ms.  Telemetry monitor does show at times APCs.  Labs are largely unremarkable except for sodium of 134.  Covid test is pending.  CT head CT C-spine and x-rays did not show anything acute.  Patient admitted for further observation.  Review of Systems: As per HPI, rest all negative.   Past Medical History:   Diagnosis Date  . Anemia   . Anxiety   . Atrial fibrillation ([place])   . Colon adenocarcinoma ([place])   . DJD (degenerative joint disease)   . Fibromyalgia   . GERD (gastroesophageal reflux disease)   . History of poliomyelitis   . History of UTI   . Hyperlipemia   . Memory disorder 03/01/2019  . Other drug allergy(995.27)   . Scoliosis   . Surgical or other procedure not carried out because of patient's decision   . Venous insufficiency     Past Surgical History:  Procedure Laterality Date  . APPENDECTOMY    . LOW ANTERIOR BOWEL RESECTION  06/2009   with anastomosis for colon cancer  . OTHER SURGICAL HISTORY     T & A  . TOTAL ABDOMINAL HYSTERECTOMY W/ BILATERAL SALPINGOOPHORECTOMY  1980s     reports that she has never smoked. She has never used smokeless tobacco. She reports that she does not drink alcohol and does not use drugs.  Allergies  Allergen Reactions  . Sulfa Antibiotics Anaphylaxis    REACTION: elevated HR and BP  . Alendronate Other (See Comments)    "twitching" "twitching"  . Ampicillin     REACTION: rash and high fever  . Aspirin     REACTION: ears ringing  . Cyclobenzaprine Hcl     REACTION: nightmares  . Metaxalone     REACTION: nightmares  . Nitrofurantoin     REACTION: elevated BP and chest pain  . Nsaids  REACTION: rash  . Penicillins     REACTION: rash, high fever  . Sulfonamide Derivatives     REACTION: elevated HR and BP  . Aricept [Donepezil] Rash    Dizziness and cramping  . Nortriptyline Hcl Rash    Family History  Problem Relation Age of Onset  . Stroke Father   . Cancer Mother        brain  . Colon cancer Neg Hx     Prior to Admission medications   Medication Sig Start Date End Date Taking? Authorizing Provider  Acetaminophen (TYLENOL ARTHRITIS EXT RELIEF PO) Take 650 mg by mouth 2 (two) times daily as needed (pain).   Yes [provider]  alendronate (FOSAMAX) 70 MG tablet Take 70 mg by mouth every Tuesday.  03/31/20  Yes [provider]  Calcium Carbonate (CALCIUM 600 PO) Take 1 tablet by mouth daily.   Yes [provider]  calcium carbonate (TUMS - DOSED IN MG ELEMENTAL CALCIUM) 500 MG chewable tablet Chew 1 tablet by mouth daily as needed for indigestion or heartburn.   Yes [provider]  cholecalciferol (VITAMIN D) 1000 units tablet Take 1,000 Units by mouth daily.   Yes [provider]  loratadine (CLARITIN) 10 MG tablet Take 10 mg by mouth as needed for allergies or rhinitis.   Yes [provider]  Menthol (HALLS COUGH DROPS) 5.8 MG LOZG Use as directed 1 lozenge in the mouth or throat daily as needed (cough).   Yes [provider]    Physical Exam: Constitutional: Moderately built and nourished. Vitals:   07/03/20 0130 07/03/20 0215 07/03/20 0230 07/03/20 0453  BP: (!) 133/59 (!) 145/82 (!) 142/77 133/72  Pulse: 70 89 70 78  Resp: (!) 22 (!) 21 (!) 23 (!) 24  Temp:    98.6 F (37 C)  TempSrc:    Axillary  SpO2: 95% 98% 96% 96%   Eyes: Anicteric no pallor. ENMT: No discharge from the ears eyes nose or mouth. Neck: No mass felt.  No neck rigidity.  No JVD appreciated. Respiratory: No rhonchi or crepitations. Cardiovascular: S1-S2 heard. Abdomen: Soft nontender bowel sounds present. Musculoskeletal: No edema.   Skin: No rash. Neurologic: Alert awake oriented to time place and person.  Moves all extremities. Psychiatric: Appears normal.  Normal affect.   Labs on Admission: I have personally reviewed following labs and imaging studies  CBC: Recent Labs  Lab 07/02/20 1218  WBC 5.8  HGB 13.4  HCT 42.7  MCV 89.1  PLT 99991111   Basic Metabolic Panel: Recent Labs  Lab 07/02/20 1218  NA 134*  K 3.9  CL 99  CO2 25  GLUCOSE 100*  BUN 7*  CREATININE 0.92  CALCIUM 8.7*   GFR: Estimated Creatinine Clearance: 32.1 mL/min (by C-G formula based on SCr of 0.92 mg/dL). Liver Function Tests: No results for input(s): AST, ALT,  ALKPHOS, BILITOT, PROT, ALBUMIN in the last 168 hours. No results for input(s): LIPASE, AMYLASE in the last 168 hours. No results for input(s): AMMONIA in the last 168 hours. Coagulation Profile: No results for input(s): INR, PROTIME in the last 168 hours. Cardiac Enzymes: No results for input(s): CKTOTAL, CKMB, CKMBINDEX, TROPONINI in the last 168 hours. BNP (last 3 results) No results for input(s): PROBNP in the last 8760 hours. HbA1C: No results for input(s): HGBA1C in the last 72 hours. CBG: No results for input(s): GLUCAP in the last 168 hours. Lipid Profile: No results for input(s): CHOL, HDL, LDLCALC, TRIG, CHOLHDL,  LDLDIRECT in the last 72 hours. Thyroid Function Tests: No results for input(s): TSH, T4TOTAL, FREET4, T3FREE, THYROIDAB in the last 72 hours. Anemia Panel: No results for input(s): VITAMINB12, FOLATE, FERRITIN, TIBC, IRON, RETICCTPCT in the last 72 hours. Urine analysis:    Component Value Date/Time   COLORURINE YELLOW 07/03/2020 0314   APPEARANCEUR CLEAR 07/03/2020 0314   LABSPEC 1.004 (L) 07/03/2020 0314   PHURINE 7.0 07/03/2020 0314   GLUCOSEU NEGATIVE 07/03/2020 0314   GLUCOSEU NEGATIVE 04/17/2020 1334   HGBUR NEGATIVE 07/03/2020 0314   BILIRUBINUR NEGATIVE 07/03/2020 0314   KETONESUR NEGATIVE 07/03/2020 0314   PROTEINUR NEGATIVE 07/03/2020 0314   UROBILINOGEN 0.2 04/17/2020 1334   NITRITE NEGATIVE 07/03/2020 0314   LEUKOCYTESUR NEGATIVE 07/03/2020 0314   Sepsis Labs: @LABRCNTIP (procalcitonin:4,lacticidven:4) )No results found for this or any previous visit (from the past 240 hour(s)).   Radiological Exams on Admission: CT Head Wo Contrast  Result Date: 07/02/2020 CLINICAL DATA:  Pain following fall EXAM: CT HEAD WITHOUT CONTRAST CT CERVICAL SPINE WITHOUT CONTRAST TECHNIQUE: Multidetector CT imaging of the head and cervical spine was performed following the standard protocol without intravenous contrast. Multiplanar CT image reconstructions of the  cervical spine were also generated. COMPARISON:  None. FINDINGS: CT HEAD FINDINGS Brain: Moderate diffuse atrophy present. There is no intracranial mass, hemorrhage, extra-axial fluid collection, or midline shift. There is patchy small vessel disease in the centra semiovale bilaterally. No acute appearing infarct evident. Vascular: No hyperdense vessel. Basilar artery appears tortuous. Carotid siphon arterial vascular calcification noted. Skull: The bony calvarium appears intact. Sinuses/Orbits: There is mucosal thickening in the right maxillary antrum. There is mucosal thickening and opacification in several ethmoid air cells. There is mucosal thickening in the posterior right frontal sinus. Orbits appear symmetric bilaterally. Other: Mastoid air cells are clear. CT CERVICAL SPINE FINDINGS Alignment: There is 1 mm of retrolisthesis of C3 on C4. There is 1 mm of retrolisthesis of C4 on C5. There is 1 mm of retrolisthesis of C5 on C6. There is 1 mm of anterolisthesis of T1 on T2. Skull base and vertebrae: Skull base and craniocervical junction regions appear normal. There is pannus posterior to the odontoid, not causing significant impression on the underlying craniocervical junction. There is a degree of underlying osteoporosis. No acute fracture evident. No blastic or lytic bone lesions. Soft tissues and spinal canal: Prevertebral soft tissues and predental space regions are normal. There is no appreciable cord or canal hematoma. No paraspinous lesions are evident. Disc levels: There is moderately severe disc space narrowing and C3-4 with moderate disc space narrowing at C4-5 and C5-6. There is facet hypertrophy at multiple levels bilaterally. There is no frank disc extrusion or stenosis. No appreciable nerve root edema or effacement. Upper chest: Areas of atelectasis and apparent scarring noted in the upper lobe regions. No apical pneumothorax evident. Other: None IMPRESSION: CT head: Atrophy with periventricular  small vessel disease. No mass or hemorrhage. No evident acute infarct. There are foci of arterial vascular calcification. There is paranasal sinus disease at several sites. CT cervical spine: Underlying osteoporosis. No acute fracture. Slight spondylolisthesis at several levels is felt to be due to underlying spondylosis. Osteoarthritic change at several levels noted. No disc extrusion or stenosis. Scarring and atelectasis noted in the upper lung regions, more on the left than on the right. Electronically Signed   By: Lowella Grip III M.D.   On: 07/02/2020 13:05   CT Cervical Spine Wo Contrast  Result Date: 07/02/2020 CLINICAL DATA:  Pain  following fall EXAM: CT HEAD WITHOUT CONTRAST CT CERVICAL SPINE WITHOUT CONTRAST TECHNIQUE: Multidetector CT imaging of the head and cervical spine was performed following the standard protocol without intravenous contrast. Multiplanar CT image reconstructions of the cervical spine were also generated. COMPARISON:  None. FINDINGS: CT HEAD FINDINGS Brain: Moderate diffuse atrophy present. There is no intracranial mass, hemorrhage, extra-axial fluid collection, or midline shift. There is patchy small vessel disease in the centra semiovale bilaterally. No acute appearing infarct evident. Vascular: No hyperdense vessel. Basilar artery appears tortuous. Carotid siphon arterial vascular calcification noted. Skull: The bony calvarium appears intact. Sinuses/Orbits: There is mucosal thickening in the right maxillary antrum. There is mucosal thickening and opacification in several ethmoid air cells. There is mucosal thickening in the posterior right frontal sinus. Orbits appear symmetric bilaterally. Other: Mastoid air cells are clear. CT CERVICAL SPINE FINDINGS Alignment: There is 1 mm of retrolisthesis of C3 on C4. There is 1 mm of retrolisthesis of C4 on C5. There is 1 mm of retrolisthesis of C5 on C6. There is 1 mm of anterolisthesis of T1 on T2. Skull base and vertebrae: Skull  base and craniocervical junction regions appear normal. There is pannus posterior to the odontoid, not causing significant impression on the underlying craniocervical junction. There is a degree of underlying osteoporosis. No acute fracture evident. No blastic or lytic bone lesions. Soft tissues and spinal canal: Prevertebral soft tissues and predental space regions are normal. There is no appreciable cord or canal hematoma. No paraspinous lesions are evident. Disc levels: There is moderately severe disc space narrowing and C3-4 with moderate disc space narrowing at C4-5 and C5-6. There is facet hypertrophy at multiple levels bilaterally. There is no frank disc extrusion or stenosis. No appreciable nerve root edema or effacement. Upper chest: Areas of atelectasis and apparent scarring noted in the upper lobe regions. No apical pneumothorax evident. Other: None IMPRESSION: CT head: Atrophy with periventricular small vessel disease. No mass or hemorrhage. No evident acute infarct. There are foci of arterial vascular calcification. There is paranasal sinus disease at several sites. CT cervical spine: Underlying osteoporosis. No acute fracture. Slight spondylolisthesis at several levels is felt to be due to underlying spondylosis. Osteoarthritic change at several levels noted. No disc extrusion or stenosis. Scarring and atelectasis noted in the upper lung regions, more on the left than on the right. Electronically Signed   By: Bretta Bang III M.D.   On: 07/02/2020 13:05   DG Knee Complete 4 Views Right  Result Date: 07/02/2020 CLINICAL DATA:  Fall at home with right posterior knee pain. Initial encounter. EXAM: RIGHT KNEE - COMPLETE 4+ VIEW COMPARISON:  None. FINDINGS: Artifact from overlapping bedding. No acute fracture, subluxation, or joint effusion. Degenerative marginal spurring and generalized osteopenia. Senile chondrocalcinosis. IMPRESSION: No acute finding. Electronically Signed   By: Marnee Spring  M.D.   On: 07/02/2020 11:53   DG Hip Unilat W or Wo Pelvis 2-3 Views Left  Result Date: 07/02/2020 CLINICAL DATA:  Fall. EXAM: DG HIP (WITH OR WITHOUT PELVIS) 2-3V LEFT COMPARISON:  CT 02/27/2014. FINDINGS: Diffuse osteopenia. Degenerative changes lumbar spine, both SI joints, and both hips. No acute bony abnormality. Surgical sutures noted the pelvis. Large amount of stool noted throughout the colon. IMPRESSION: 1. Diffuse osteopenia. Degenerative changes lumbar spine and both hips. No acute bony abnormality. 2. Large amount of stool noted throughout the colon. Electronically Signed   By: Maisie Fus  Register   On: 07/02/2020 11:54    EKG: Independently reviewed.  Normal sinus rhythm with short PR interval QTC 456 ms.  Assessment/Plan Principal Problem:   Syncope and collapse    1. Syncope cause not clear.  The last episode had no prodrome so will closely monitor in telemetry rule out any cardiac issues.  Consulted cardiology.  Check 2D echo. 2. History of memory impairment presently not on any medications. 3. History of postop A. fib in 2011. 4. History of colon cancer being followed by gastroenterologist. 5. History of arthritis presently not on any medication.  Covid test is pending.   DVT prophylaxis: Lovenox. Code Status: Full code. Family Communication: Patient's daughter. Disposition Plan: Home. Consults called: Cardiology. Admission status: Observation.   Rise Patience MD [facility] Pager [phone].  If 7PM-7AM, please contact night-coverage [email or web address] Password [email or web address]  07/03/2020, 5:40 AM

## 2020-07-03 NOTE — Progress Notes (Addendum)
PROGRESS NOTE  Brenda Hammond RUE:[id number] DOB: 1933-01-30 DOA: 07/02/2020 PCP: Overton Mam, DO  Brief History   Brenda Hammond is a 87 y.o. female with history of postop A. fib around 2011 and subsequently which patient has been off anticoagulation with history of colon cancer status post surgery being followed by [facility], memory impairment presently not on any medication followed by Dr. Anne Hahn neurologist had 3 episodes of loss of consciousness over the last 10 days.  The first episode happened around June 23, 2020 when patient was playing cards in a standing position when patient felt dizzy and lost consciousness for few minutes the exact duration was not known.  Patient did not have any chest pain shortness of breath or any incontinence of urine or bowel during the episode.  The second episode happened around June 26, 2020 when patient was waiting to go to the bathroom and had some abdominal discomfort at that time.  When patient's husband opened the bathrooms door patient was on the floor unconscious.  But about an hour and a half later after the incident patient did go to shopping with her daughter when patient did walk well.  Had no episodes at that time.  The last episode happened yesterday morning when patient was in the kitchen at this time patient had no prodromal symptoms and loss consciousness and was found on the floor.  Patient was brought to the ER.  ED Course: In the ER patient remains hemodynamically stable EKG shows normal sinus rhythm with short PR interval QTC of 456 ms.  Telemetry monitor does show at times APCs.  Labs are largely unremarkable except for sodium of 134.  CT head CT C-spine and x-rays did not show anything acute.  Patient admitted for further observation.  [facility] were consulted to admit the patient for further evaluation and care. She tested positive for COVID.  Endocardiogram is pending. The patient is being monitored  on telemetry.  Consultants  . None  Procedures  . None  Antibiotics   Anti-infectives (From admission, onward)   Start     Dose/Rate Route Frequency Ordered Stop   07/04/20 1000  remdesivir 100 mg in sodium chloride 0.9 % 100 mL IVPB  Status:  Discontinued       "Followed by" Linked Group Details   100 mg 200 mL/hr over 30 Minutes Intravenous Daily 07/03/20 0846 07/03/20 1113   07/04/20 1000  remdesivir 100 mg in sodium chloride 0.9 % 100 mL IVPB       "Followed by" Linked Group Details   100 mg 200 mL/hr over 30 Minutes Intravenous Daily 07/03/20 1113 07/06/20 0959   07/03/20 1000  remdesivir 200 mg in sodium chloride 0.9% 250 mL IVPB       "Followed by" Linked Group Details   200 mg 580 mL/hr over 30 Minutes Intravenous Once 07/03/20 0846 07/03/20 1140    .  Subjective  The patient is resting comfortably. No new complaints.  Objective   Vitals:  Vitals:   07/03/20 1100 07/03/20 1200  BP: 118/77 136/64  Pulse: 80 70  Resp: 20 (!) 24  Temp:    SpO2: 97% 96%   Exam:  Constitutional:  . The patient is awake, alert, and oriented x 3. No acute distress. Respiratory:  . No increased work of breathing. . No wheezes, rales, or rhonchi . No tactile fremitus Cardiovascular:  . Regular rate and rhythm . No murmurs, ectopy, or gallups. . No lateral PMI. No thrills.  Abdomen:  . Abdomen is soft, non-tender, non-distended . No hernias, masses, or organomegaly . Normoactive bowel sounds.  Musculoskeletal:  . No cyanosis, clubbing, or edema Skin:  . No rashes, lesions, ulcers . palpation of skin: no induration or nodules Neurologic:  . CN 2-12 intact . Sensation all 4 extremities intact Psychiatric:  . Mental status o Mood, affect appropriate o Orientation to person, place, time  . judgment and insight appear intact   I have personally reviewed the following:   Today's Data  . Vitals, LDH, Troponin, Ferritin, CRP, D Dimer, CBC  Micro Data  . COVID-19  positive  Imaging  . CXR . CT head  Cardiology Data  . EKG  Scheduled Meds: . enoxaparin (LOVENOX) injection  40 mg Subcutaneous Q24H  . loratadine  10 mg Oral QHS   Continuous Infusions: . [START ON 07/04/2020] remdesivir 100 mg in NS 100 mL      Principal Problem:   Syncope and collapse   LOS: 0 days   A & P   Syncope cause not clear.  The last episode had no prodrome so will closely monitor in telemetry rule out any cardiac issues.  Consulted cardiology.  Echocardiogram is pending.  History of memory impairment presently not on any medications.  History of postop A. fib in 2011.  History of colon cancer being followed by gastroenterologist.  History of arthritis presently not on any medication.  Covid-19 positive: Appears to be an incidental finding. CRP slightly elevated at 1.2, Ferritin is within normal limits, D Dimer is 0.48 and normal, and LDH was normal at 187. I have discussed the patient with Dr. Nena Alexander. He advised me to place the patient on the 3 day remdesivir regimen without steroids as the patient does not have respiratory symptoms.  I have seen and examined this patient myself. I have spent 36 minutes in her evaluation and care.  DVT prophylaxis: Lovenox. Code Status: Full code. Family Communication: none available. Disposition Plan: Home.  Raciel Caffrey, DO [facility] Direct contact: see [email or web address]  7PM-7AM contact night coverage as above 07/03/2020, 2:04 PM  LOS: 0 days

## 2020-07-03 NOTE — Telephone Encounter (Signed)
Pt already has appt scheduled later this month, they wanted to know if they needed to see Dr C first or the cardiologist first

## 2020-07-04 ENCOUNTER — Other Ambulatory Visit: Payer: Self-pay | Admitting: Cardiology

## 2020-07-04 ENCOUNTER — Inpatient Hospital Stay (HOSPITAL_COMMUNITY): Payer: Medicare Other

## 2020-07-04 ENCOUNTER — Other Ambulatory Visit: Payer: Self-pay

## 2020-07-04 DIAGNOSIS — R55 Syncope and collapse: Secondary | ICD-10-CM

## 2020-07-04 LAB — URINE CULTURE: Culture: <10000 — AB

## 2020-07-04 LAB — ECHOCARDIOGRAM LIMITED
Area-P 1/2: 4.21 cm2
Height: 61 in
S' Lateral: 2.8 cm
Weight: 1761.6 oz

## 2020-07-04 NOTE — Progress Notes (Signed)
Progress Note  Patient Name: Brenda Hammond Date of Encounter: 07/04/2020  [facility] Cardiologist: NEW  Subjective   Spoke with daughter on phone   Pt not dizzy  No SOB  Inpatient Medications    Scheduled Meds: . enoxaparin (LOVENOX) injection  40 mg Subcutaneous Q24H  . loratadine  10 mg Oral QHS   Continuous Infusions: . remdesivir 100 mg in NS 100 mL     PRN Meds: acetaminophen **OR** acetaminophen, calcium carbonate   Vital Signs    Vitals:   07/03/20 1749 07/03/20 2043 07/04/20 0454 07/04/20 0725  BP: 138/71 130/66 (!) 141/69 (!) 145/75  Pulse: 89 78 65 81  Resp:  20 16 16   Temp:  98.1 F (36.7 C) 97.7 F (36.5 C) (!) 96.3 F (35.7 C)  TempSrc:  Oral Oral Axillary  SpO2:  97% 99% 100%  Weight:      Height:       No intake or output data in the 24 hours ending 07/04/20 0759 Last 3 Weights 07/03/2020 06/30/2020 04/17/2020  Weight (lbs) 110 lb 1.6 oz 118 lb 118 lb 9.6 oz  Weight (kg) 49.941 kg 53.524 kg 53.797 kg      Telemetry    SR - Personally Reviewed  ECG    No new  - Personally Reviewed  Physical Exam   Orthostatic VS  Laying 138/71    P 89   Sitting  128/70   P 76    Standing 128/83  P 86   Standing 3 min 147/82   P 89   Exam not performed due to COVID   Labs    High Sensitivity Troponin:   Recent Labs  Lab 07/03/20 0045 07/03/20 1141 07/03/20 2054  TROPONINIHS 8 7 7       Chemistry Recent Labs  Lab 07/02/20 1218 07/03/20 0540  NA 134*  --   K 3.9  --   CL 99  --   CO2 25  --   GLUCOSE 100*  --   BUN 7*  --   CREATININE 0.92 0.77  CALCIUM 8.7*  --   GFRNONAA >60 >60  ANIONGAP 10  --      Hematology Recent Labs  Lab 07/02/20 1218 07/03/20 0540  WBC 5.8 3.4*  RBC 4.79 4.33  HGB 13.4 12.1  HCT 42.7 38.1  MCV 89.1 88.0  MCH 28.0 27.9  MCHC 31.4 31.8  RDW 13.2 13.2  PLT 186 166    BNPNo results for input(s): BNP, PROBNP in the last 168 hours.   DDimer  Recent Labs  Lab 07/03/20 0540  DDIMER  0.48     Radiology    CT Head Wo Contrast  Result Date: 07/02/2020 CLINICAL DATA:  Pain following fall EXAM: CT HEAD WITHOUT CONTRAST CT CERVICAL SPINE WITHOUT CONTRAST TECHNIQUE: Multidetector CT imaging of the head and cervical spine was performed following the standard protocol without intravenous contrast. Multiplanar CT image reconstructions of the cervical spine were also generated. COMPARISON:  None. FINDINGS: CT HEAD FINDINGS Brain: Moderate diffuse atrophy present. There is no intracranial mass, hemorrhage, extra-axial fluid collection, or midline shift. There is patchy small vessel disease in the centra semiovale bilaterally. No acute appearing infarct evident. Vascular: No hyperdense vessel. Basilar artery appears tortuous. Carotid siphon arterial vascular calcification noted. Skull: The bony calvarium appears intact. Sinuses/Orbits: There is mucosal thickening in the right maxillary antrum. There is mucosal thickening and opacification in several ethmoid air cells. There is mucosal thickening in the posterior  right frontal sinus. Orbits appear symmetric bilaterally. Other: Mastoid air cells are clear. CT CERVICAL SPINE FINDINGS Alignment: There is 1 mm of retrolisthesis of C3 on C4. There is 1 mm of retrolisthesis of C4 on C5. There is 1 mm of retrolisthesis of C5 on C6. There is 1 mm of anterolisthesis of T1 on T2. Skull base and vertebrae: Skull base and craniocervical junction regions appear normal. There is pannus posterior to the odontoid, not causing significant impression on the underlying craniocervical junction. There is a degree of underlying osteoporosis. No acute fracture evident. No blastic or lytic bone lesions. Soft tissues and spinal canal: Prevertebral soft tissues and predental space regions are normal. There is no appreciable cord or canal hematoma. No paraspinous lesions are evident. Disc levels: There is moderately severe disc space narrowing and C3-4 with moderate disc space  narrowing at C4-5 and C5-6. There is facet hypertrophy at multiple levels bilaterally. There is no frank disc extrusion or stenosis. No appreciable nerve root edema or effacement. Upper chest: Areas of atelectasis and apparent scarring noted in the upper lobe regions. No apical pneumothorax evident. Other: None IMPRESSION: CT head: Atrophy with periventricular small vessel disease. No mass or hemorrhage. No evident acute infarct. There are foci of arterial vascular calcification. There is paranasal sinus disease at several sites. CT cervical spine: Underlying osteoporosis. No acute fracture. Slight spondylolisthesis at several levels is felt to be due to underlying spondylosis. Osteoarthritic change at several levels noted. No disc extrusion or stenosis. Scarring and atelectasis noted in the upper lung regions, more on the left than on the right. Electronically Signed   By: Lowella Grip III M.D.   On: 07/02/2020 13:05   CT Cervical Spine Wo Contrast  Result Date: 07/02/2020 CLINICAL DATA:  Pain following fall EXAM: CT HEAD WITHOUT CONTRAST CT CERVICAL SPINE WITHOUT CONTRAST TECHNIQUE: Multidetector CT imaging of the head and cervical spine was performed following the standard protocol without intravenous contrast. Multiplanar CT image reconstructions of the cervical spine were also generated. COMPARISON:  None. FINDINGS: CT HEAD FINDINGS Brain: Moderate diffuse atrophy present. There is no intracranial mass, hemorrhage, extra-axial fluid collection, or midline shift. There is patchy small vessel disease in the centra semiovale bilaterally. No acute appearing infarct evident. Vascular: No hyperdense vessel. Basilar artery appears tortuous. Carotid siphon arterial vascular calcification noted. Skull: The bony calvarium appears intact. Sinuses/Orbits: There is mucosal thickening in the right maxillary antrum. There is mucosal thickening and opacification in several ethmoid air cells. There is mucosal thickening  in the posterior right frontal sinus. Orbits appear symmetric bilaterally. Other: Mastoid air cells are clear. CT CERVICAL SPINE FINDINGS Alignment: There is 1 mm of retrolisthesis of C3 on C4. There is 1 mm of retrolisthesis of C4 on C5. There is 1 mm of retrolisthesis of C5 on C6. There is 1 mm of anterolisthesis of T1 on T2. Skull base and vertebrae: Skull base and craniocervical junction regions appear normal. There is pannus posterior to the odontoid, not causing significant impression on the underlying craniocervical junction. There is a degree of underlying osteoporosis. No acute fracture evident. No blastic or lytic bone lesions. Soft tissues and spinal canal: Prevertebral soft tissues and predental space regions are normal. There is no appreciable cord or canal hematoma. No paraspinous lesions are evident. Disc levels: There is moderately severe disc space narrowing and C3-4 with moderate disc space narrowing at C4-5 and C5-6. There is facet hypertrophy at multiple levels bilaterally. There is no frank disc extrusion  or stenosis. No appreciable nerve root edema or effacement. Upper chest: Areas of atelectasis and apparent scarring noted in the upper lobe regions. No apical pneumothorax evident. Other: None IMPRESSION: CT head: Atrophy with periventricular small vessel disease. No mass or hemorrhage. No evident acute infarct. There are foci of arterial vascular calcification. There is paranasal sinus disease at several sites. CT cervical spine: Underlying osteoporosis. No acute fracture. Slight spondylolisthesis at several levels is felt to be due to underlying spondylosis. Osteoarthritic change at several levels noted. No disc extrusion or stenosis. Scarring and atelectasis noted in the upper lung regions, more on the left than on the right. Electronically Signed   By: Lowella Grip III M.D.   On: 07/02/2020 13:05   Portable chest 1 View  Result Date: 07/03/2020 CLINICAL DATA:  COVID positive EXAM:  PORTABLE CHEST 1 VIEW COMPARISON:  11/26/2015 FINDINGS: Normal cardiac silhouette. Lungs are mildly hyperinflated. There is chronic bronchitic markings centrally. Scarring in the upper lobes is noted. No clear airspace disease present. No pneumothorax. No acute osseous abnormality. IMPRESSION: No pneumonia identified.  Chronic findings as above. Electronically Signed   By: Suzy Bouchard M.D.   On: 07/03/2020 09:33   DG Knee Complete 4 Views Right  Result Date: 07/02/2020 CLINICAL DATA:  Fall at home with right posterior knee pain. Initial encounter. EXAM: RIGHT KNEE - COMPLETE 4+ VIEW COMPARISON:  None. FINDINGS: Artifact from overlapping bedding. No acute fracture, subluxation, or joint effusion. Degenerative marginal spurring and generalized osteopenia. Senile chondrocalcinosis. IMPRESSION: No acute finding. Electronically Signed   By: Monte Fantasia M.D.   On: 07/02/2020 11:53   DG Hip Unilat W or Wo Pelvis 2-3 Views Left  Result Date: 07/02/2020 CLINICAL DATA:  Fall. EXAM: DG HIP (WITH OR WITHOUT PELVIS) 2-3V LEFT COMPARISON:  CT 02/27/2014. FINDINGS: Diffuse osteopenia. Degenerative changes lumbar spine, both SI joints, and both hips. No acute bony abnormality. Surgical sutures noted the pelvis. Large amount of stool noted throughout the colon. IMPRESSION: 1. Diffuse osteopenia. Degenerative changes lumbar spine and both hips. No acute bony abnormality. 2. Large amount of stool noted throughout the colon. Electronically Signed   By: Marcello Moores  Register   On: 07/02/2020 11:54    Cardiac Studies   Echo ordered   Patient Profile      Brenda Hammond is a 87 y.o. female with a hx of post op Afib 2011, colon cancer, GERD, HLD, RA who is being seen today for the evaluation of syncope in setting of COVID positive  at the request of Dr. Benny Lennert.   Assessment & Plan    1  Syncope   Orhtostatics are negative today   Echo has been ordered to r/o structural problem    2  Hx remote Afib  Tele  shows normal rhythm  If echo is normal can go home today   Will set up for event monitor to try to capture arrhythmia   Follow closely   Encourage hydration  For questions or updates, please contact [place] Please consult [email or web address] for contact info under        Signed, Dorris Carnes, MD  07/04/2020, 7:59 AM

## 2020-07-04 NOTE — Progress Notes (Signed)
  Echocardiogram 2D Echocardiogram has been performed.  Brenda Hammond 07/04/2020, 3:58 PM

## 2020-07-04 NOTE — Progress Notes (Signed)
PROGRESS NOTE  Brenda Hammond DOB: 02-27-1933 DOA: 07/02/2020 PCP: Ronnald Nian, DO  Brief History   Brenda Hammond is a 87 y.o. female with history of postop A. fib around 2011 and subsequently which patient has been off anticoagulation with history of colon cancer status post surgery being followed by [facility], memory impairment presently not on any medication followed by Dr. Jannifer Franklin neurologist had 3 episodes of loss of consciousness over the last 10 days.  The first episode happened around June 23, 2020 when patient was playing cards in a standing position when patient felt dizzy and lost consciousness for few minutes the exact duration was not known.  Patient did not have any chest pain shortness of breath or any incontinence of urine or bowel during the episode.  The second episode happened around June 26, 2020 when patient was waiting to go to the bathroom and had some abdominal discomfort at that time.  When patient's husband opened the bathrooms door patient was on the floor unconscious.  But about an hour and a half later after the incident patient did go to shopping with her daughter when patient did walk well.  Had no episodes at that time.  The last episode happened yesterday morning when patient was in the kitchen at this time patient had no prodromal symptoms and loss consciousness and was found on the floor.  Patient was brought to the ER.  ED Course: In the ER patient remains hemodynamically stable EKG shows normal sinus rhythm with short PR interval QTC of 456 ms.  Telemetry monitor does show at times APCs.  Labs are largely unremarkable except for sodium of 134.  CT head CT C-spine and x-rays did not show anything acute.  Patient admitted for further observation.  [facility] hospitalists were consulted to admit the patient for further evaluation and care. She tested positive for COVID.  She is receiving the three day course of remdesevir. The  patient is being monitored on telemetry.  Cardiology has been consulted. Echocardiogram was performed. It has demonstrated EF of 55-60%. The LV has normal function with no regional wall motion abnormalities. Grade I diastolic dysfunction was observed. RV function was normal with RVSP of 22.9 mmHg. No valvular abnormalities were observed. There was a trivial pericardial effusion. There was no atrial level shunt detected.  Consultants  . None  Procedures  . None  Antibiotics   Anti-infectives (From admission, onward)   Start     Dose/Rate Route Frequency Ordered Stop   07/04/20 1000  remdesivir 100 mg in sodium chloride 0.9 % 100 mL IVPB  Status:  Discontinued       "Followed by" Linked Group Details   100 mg 200 mL/hr over 30 Minutes Intravenous Daily 07/03/20 0846 07/03/20 1113   07/04/20 1000  remdesivir 100 mg in sodium chloride 0.9 % 100 mL IVPB       "Followed by" Linked Group Details   100 mg 200 mL/hr over 30 Minutes Intravenous Daily 07/03/20 1113 07/06/20 0959   07/03/20 1000  remdesivir 200 mg in sodium chloride 0.9% 250 mL IVPB       "Followed by" Linked Group Details   200 mg 580 mL/hr over 30 Minutes Intravenous Once 07/03/20 0846 07/03/20 1140     Subjective  The patient is resting comfortably. No new complaints. Daughter is at bedside.  Objective   Vitals:  Vitals:   07/04/20 0941 07/04/20 1753  BP: (!) 142/70 (!) 175/71  Pulse: 79 69  Resp: 18 16  Temp: 98.4 F (36.9 C) 97.8 F (36.6 C)  SpO2: 99% 97%   Exam:  Constitutional:  . The patient is awake, alert, and oriented x 3. No acute distress. Respiratory:  . No increased work of breathing. . No wheezes, rales, or rhonchi . No tactile fremitus Cardiovascular:  . Regular rate and rhythm . No murmurs, ectopy, or gallups. . No lateral PMI. No thrills. Abdomen:  . Abdomen is soft, non-tender, non-distended . No hernias, masses, or organomegaly . Normoactive bowel sounds.  Musculoskeletal:  . No  cyanosis, clubbing, or edema Skin:  . No rashes, lesions, ulcers . palpation of skin: no induration or nodules Neurologic:  . CN 2-12 intact . Sensation all 4 extremities intact Psychiatric:  . Mental status o Mood, affect appropriate o Orientation to person, place, time  . judgment and insight appear intact   I have personally reviewed the following:   Today's Data  . Orthoptist  . COVID-19 positive  Imaging  . CXR . CT head  Cardiology Data  . EKG . Echocardiogram  Scheduled Meds: . enoxaparin (LOVENOX) injection  40 mg Subcutaneous Q24H  . loratadine  10 mg Oral QHS   Continuous Infusions: . remdesivir 100 mg in NS 100 mL 100 mg (07/04/20 1041)    Principal Problem:   Syncope and collapse   LOS: 1 day   A & P   Syncope cause not clear.  The last episode had no prodrome so will closely monitor in telemetry rule out any cardiac issues.  Consulted cardiology.  Echocardiogram is unremarkable. I appreciated cardiology's assistance.  History of memory impairment presently not on any medications.  History of postop A. fib in 2011.  History of colon cancer being followed by gastroenterologist.  History of arthritis presently not on any medication.  Covid-19 positive: Appears to be an incidental finding. CRP slightly elevated at 1.2, Ferritin is within normal limits, D Dimer is 0.48 and normal, and LDH was normal at 187. I have discussed the patient with Dr. Nena Alexander. He advised me to place the patient on the 3 day remdesivir regimen without steroids as the patient does not have respiratory symptoms. Will keep the patient overnight to complete her three day course of remdesivir prior to discharge.  I have seen and examined this patient myself. I have spent 32 minutes in her evaluation and care.  DVT prophylaxis: Lovenox. Code Status: Full code. Family Communication: none available. Disposition Plan: Home.  Cyanne Delmar, DO [facility] Direct contact: see [email or web address]  7PM-7AM contact night coverage as above 07/04/2020, 6:27 PM  LOS: 0 days

## 2020-07-05 DIAGNOSIS — R55 Syncope and collapse: Secondary | ICD-10-CM | POA: Diagnosis not present

## 2020-07-05 NOTE — Discharge Summary (Addendum)
Physician Discharge Summary  Brenda Hammond [id number] DOB: Mar 30, 1933 DOA: 07/02/2020  PCP: Brenda Nian, DO  Admit date: 07/02/2020 Discharge date: 07/05/2020  Recommendations for Outpatient Follow-up:  Discharge to home with family. Due to Positive COVID-19 status she should isolate for 10 days. She should be expected to test positive for 90 days after this positive test. This should not be considered an new infection if she does. Supervision for mobility. Cardiology to provide event monitor for patient. Patient to follow up with PCP in 10-14 days.  Chemistry to be checked on that visit.   Discharge Diagnoses: Principal diagnosis is #1 Syncope - recurrent and unrelated to COVID-19 COVID - 19 positive. Incidental finding History of memory impairment History of post-operative atrial fibrillation in 2011. History of colon cancer - being followed by gastroenterology History of arthritis  Discharge Condition: Fair  Disposition: Home with family. Supervision for mobility.  Diet recommendation: Heart healthy  Filed Weights   07/03/20 1726  Weight: 49.9 kg    History of present illness: Brenda Hammond is a 87 y.o. female with history of postop A. fib around 2011 and subsequently which patient has been off anticoagulation with history of colon cancer status post surgery being followed by [facility], memory impairment presently not on any medication followed by Dr. Jannifer Hammond neurologist had 3 episodes of loss of consciousness over the last 10 days.  The first episode happened around June 23, 2020 when patient was playing cards in a standing position when patient felt dizzy and lost consciousness for few minutes the exact duration was not known.  Patient did not have any chest pain shortness of breath or any incontinence of urine or bowel during the episode.  The second episode happened around June 26, 2020 when patient was waiting to go to the bathroom and had  some abdominal discomfort at that time.  When patient's husband opened the bathrooms door patient was on the floor unconscious.  But about an hour and a half later after the incident patient did go to shopping with her daughter when patient did walk well.  Had no episodes at that time.  The last episode happened yesterday morning when patient was in the kitchen at this time patient had no prodromal symptoms and loss consciousness and was found on the floor.  Patient was brought to the ER.   ED Course: In the ER patient remains hemodynamically stable EKG shows normal sinus rhythm with short PR interval QTC of 456 ms.  Telemetry monitor does show at times APCs.  Labs are largely unremarkable except for sodium of 134.  Covid test is pending.  CT head CT C-spine and x-rays did not show anything acute.  Patient admitted for further observation.  Hospital Course: [facility] were consulted to admit the patient for further evaluation and care. She tested positive for COVID.  She is receiving the three day course of remdesevir. The patient is being monitored on telemetry.   Cardiology has been consulted. Echocardiogram was performed. It has demonstrated EF of 55-60%. The LV has normal function with no regional wall motion abnormalities. Grade I diastolic dysfunction was observed. RV function was normal with RVSP of 22.9 mmHg. No valvular abnormalities were observed. There was a trivial pericardial effusion. There was no atrial level shunt detected.  The patient has completed her 3 dose regimen of remdesevir. Dr. Harrington Challenger has cleared her from a cardiovascular perspective for discharge. Dr. Harrington Challenger will also set the patient up for an event recorder.  She will follow up with cardiology as outpatient. I have recommended that the patient has supervision for mobility for safety reasons.  Today's assessment: S: The patient is resting comfortably. No new complaints. O: Vitals:  Vitals:   07/05/20 0835 07/05/20 1155   BP: (!) 142/69 (!) 151/70  Pulse: 73 63  Resp: 18 16  Temp: 98.4 F (36.9 C) 97.9 F (36.6 C)  SpO2:  98%  Exam:   Constitutional:  The patient is awake, alert, and oriented x 3. No acute distress. Respiratory:  No increased work of breathing. No wheezes, rales, or rhonchi No tactile fremitus Cardiovascular:  Regular rate and rhythm No murmurs, ectopy, or gallups. No lateral PMI. No thrills. Abdomen:  Abdomen is soft, non-tender, non-distended No hernias, masses, or organomegaly Normoactive bowel sounds.  Musculoskeletal:  No cyanosis, clubbing, or edema Skin:  No rashes, lesions, ulcers palpation of skin: no induration or nodules Neurologic:  CN 2-12 intact Sensation all 4 extremities intact Psychiatric:  Mental status Mood, affect appropriate Orientation to person, place, time Discharge Instructions  Discharge Instructions     Activity as tolerated - No restrictions   Complete by: As directed    Call MD for:  difficulty breathing, headache or visual disturbances   Complete by: As directed    Call MD for:  extreme fatigue   Complete by: As directed    Call MD for:  persistant dizziness or light-headedness   Complete by: As directed    Call MD for:  temperature >100.4   Complete by: As directed    Diet - low sodium heart healthy   Complete by: As directed    Discharge instructions   Complete by: As directed    Discharge to home with family. Due to Positive COVID-19 status she should isolate for 10 days. She should be expected to test positive for 90 days after this positive test. This should not be considered an new infection if she does. Supervision for mobility. Cardiology to provide event monitor for patient. Patient to follow up with PCP in 10-14 days.  Chemistry to be checked on that visit.   Increase activity slowly   Complete by: As directed       Allergies as of 07/05/2020       Reactions   Sulfa Antibiotics Anaphylaxis   REACTION: elevated  HR and BP   Alendronate Other (See Comments)   "twitching" "twitching"   Ampicillin    REACTION: rash and high fever   Aspirin    REACTION: ears ringing   Cyclobenzaprine Hcl    REACTION: nightmares   Metaxalone    REACTION: nightmares   Nitrofurantoin    REACTION: elevated BP and chest pain   Nsaids    REACTION: rash   Penicillins    REACTION: rash, high fever   Sulfonamide Derivatives    REACTION: elevated HR and BP   Aricept [donepezil] Rash   Dizziness and cramping   Nortriptyline Hcl Rash        Medication List     TAKE these medications    alendronate 70 MG tablet Commonly known as: FOSAMAX Take 70 mg by mouth every Tuesday.   CALCIUM 600 PO Take 1 tablet by mouth daily.   calcium carbonate 500 MG chewable tablet Commonly known as: TUMS - dosed in mg elemental calcium Chew 1 tablet by mouth daily as needed for indigestion or heartburn.   cholecalciferol 1000 units tablet Commonly known as: VITAMIN D Take 1,000 Units by mouth daily.  Halls Cough Drops 5.8 MG Lozg Generic drug: Menthol Use as directed 1 lozenge in the mouth or throat daily as needed (cough).   loratadine 10 MG tablet Commonly known as: CLARITIN Take 10 mg by mouth as needed for allergies or rhinitis.   TYLENOL ARTHRITIS EXT RELIEF PO Take 650 mg by mouth 2 (two) times daily as needed (pain).       Allergies  Allergen Reactions   Sulfa Antibiotics Anaphylaxis    REACTION: elevated HR and BP   Alendronate Other (See Comments)    "twitching" "twitching"   Ampicillin     REACTION: rash and high fever   Aspirin     REACTION: ears ringing   Cyclobenzaprine Hcl     REACTION: nightmares   Metaxalone     REACTION: nightmares   Nitrofurantoin     REACTION: elevated BP and chest pain   Nsaids     REACTION: rash   Penicillins     REACTION: rash, high fever   Sulfonamide Derivatives     REACTION: elevated HR and BP   Aricept [Donepezil] Rash    Dizziness and cramping    Nortriptyline Hcl Rash    The results of significant diagnostics from this hospitalization (including imaging, microbiology, ancillary and laboratory) are listed below for reference.    Significant Diagnostic Studies: CT Head Wo Contrast  Result Date: 07/02/2020 CLINICAL DATA:  Pain following fall EXAM: CT HEAD WITHOUT CONTRAST CT CERVICAL SPINE WITHOUT CONTRAST TECHNIQUE: Multidetector CT imaging of the head and cervical spine was performed following the standard protocol without intravenous contrast. Multiplanar CT image reconstructions of the cervical spine were also generated. COMPARISON:  None. FINDINGS: CT HEAD FINDINGS Brain: Moderate diffuse atrophy present. There is no intracranial mass, hemorrhage, extra-axial fluid collection, or midline shift. There is patchy small vessel disease in the centra semiovale bilaterally. No acute appearing infarct evident. Vascular: No hyperdense vessel. Basilar artery appears tortuous. Carotid siphon arterial vascular calcification noted. Skull: The bony calvarium appears intact. Sinuses/Orbits: There is mucosal thickening in the right maxillary antrum. There is mucosal thickening and opacification in several ethmoid air cells. There is mucosal thickening in the posterior right frontal sinus. Orbits appear symmetric bilaterally. Other: Mastoid air cells are clear. CT CERVICAL SPINE FINDINGS Alignment: There is 1 mm of retrolisthesis of C3 on C4. There is 1 mm of retrolisthesis of C4 on C5. There is 1 mm of retrolisthesis of C5 on C6. There is 1 mm of anterolisthesis of T1 on T2. Skull base and vertebrae: Skull base and craniocervical junction regions appear normal. There is pannus posterior to the odontoid, not causing significant impression on the underlying craniocervical junction. There is a degree of underlying osteoporosis. No acute fracture evident. No blastic or lytic bone lesions. Soft tissues and spinal canal: Prevertebral soft tissues and predental space  regions are normal. There is no appreciable cord or canal hematoma. No paraspinous lesions are evident. Disc levels: There is moderately severe disc space narrowing and C3-4 with moderate disc space narrowing at C4-5 and C5-6. There is facet hypertrophy at multiple levels bilaterally. There is no frank disc extrusion or stenosis. No appreciable nerve root edema or effacement. Upper chest: Areas of atelectasis and apparent scarring noted in the upper lobe regions. No apical pneumothorax evident. Other: None IMPRESSION: CT head: Atrophy with periventricular small vessel disease. No mass or hemorrhage. No evident acute infarct. There are foci of arterial vascular calcification. There is paranasal sinus disease at several sites. CT cervical spine:  Underlying osteoporosis. No acute fracture. Slight spondylolisthesis at several levels is felt to be due to underlying spondylosis. Osteoarthritic change at several levels noted. No disc extrusion or stenosis. Scarring and atelectasis noted in the upper lung regions, more on the left than on the right. Electronically Signed   By: Lowella Grip III M.D.   On: 07/02/2020 13:05   CT Cervical Spine Wo Contrast  Result Date: 07/02/2020 CLINICAL DATA:  Pain following fall EXAM: CT HEAD WITHOUT CONTRAST CT CERVICAL SPINE WITHOUT CONTRAST TECHNIQUE: Multidetector CT imaging of the head and cervical spine was performed following the standard protocol without intravenous contrast. Multiplanar CT image reconstructions of the cervical spine were also generated. COMPARISON:  None. FINDINGS: CT HEAD FINDINGS Brain: Moderate diffuse atrophy present. There is no intracranial mass, hemorrhage, extra-axial fluid collection, or midline shift. There is patchy small vessel disease in the centra semiovale bilaterally. No acute appearing infarct evident. Vascular: No hyperdense vessel. Basilar artery appears tortuous. Carotid siphon arterial vascular calcification noted. Skull: The bony  calvarium appears intact. Sinuses/Orbits: There is mucosal thickening in the right maxillary antrum. There is mucosal thickening and opacification in several ethmoid air cells. There is mucosal thickening in the posterior right frontal sinus. Orbits appear symmetric bilaterally. Other: Mastoid air cells are clear. CT CERVICAL SPINE FINDINGS Alignment: There is 1 mm of retrolisthesis of C3 on C4. There is 1 mm of retrolisthesis of C4 on C5. There is 1 mm of retrolisthesis of C5 on C6. There is 1 mm of anterolisthesis of T1 on T2. Skull base and vertebrae: Skull base and craniocervical junction regions appear normal. There is pannus posterior to the odontoid, not causing significant impression on the underlying craniocervical junction. There is a degree of underlying osteoporosis. No acute fracture evident. No blastic or lytic bone lesions. Soft tissues and spinal canal: Prevertebral soft tissues and predental space regions are normal. There is no appreciable cord or canal hematoma. No paraspinous lesions are evident. Disc levels: There is moderately severe disc space narrowing and C3-4 with moderate disc space narrowing at C4-5 and C5-6. There is facet hypertrophy at multiple levels bilaterally. There is no frank disc extrusion or stenosis. No appreciable nerve root edema or effacement. Upper chest: Areas of atelectasis and apparent scarring noted in the upper lobe regions. No apical pneumothorax evident. Other: None IMPRESSION: CT head: Atrophy with periventricular small vessel disease. No mass or hemorrhage. No evident acute infarct. There are foci of arterial vascular calcification. There is paranasal sinus disease at several sites. CT cervical spine: Underlying osteoporosis. No acute fracture. Slight spondylolisthesis at several levels is felt to be due to underlying spondylosis. Osteoarthritic change at several levels noted. No disc extrusion or stenosis. Scarring and atelectasis noted in the upper lung regions,  more on the left than on the right. Electronically Signed   By: Lowella Grip III M.D.   On: 07/02/2020 13:05   Portable chest 1 View  Result Date: 07/03/2020 CLINICAL DATA:  COVID positive EXAM: PORTABLE CHEST 1 VIEW COMPARISON:  11/26/2015 FINDINGS: Normal cardiac silhouette. Lungs are mildly hyperinflated. There is chronic bronchitic markings centrally. Scarring in the upper lobes is noted. No clear airspace disease present. No pneumothorax. No acute osseous abnormality. IMPRESSION: No pneumonia identified.  Chronic findings as above. Electronically Signed   By: Suzy Bouchard M.D.   On: 07/03/2020 09:33   DG Knee Complete 4 Views Right  Result Date: 07/02/2020 CLINICAL DATA:  Fall at home with right posterior knee pain. Initial encounter.  EXAM: RIGHT KNEE - COMPLETE 4+ VIEW COMPARISON:  None. FINDINGS: Artifact from overlapping bedding. No acute fracture, subluxation, or joint effusion. Degenerative marginal spurring and generalized osteopenia. Senile chondrocalcinosis. IMPRESSION: No acute finding. Electronically Signed   By: Monte Fantasia M.D.   On: 07/02/2020 11:53   DG Hip Unilat W or Wo Pelvis 2-3 Views Left  Result Date: 07/02/2020 CLINICAL DATA:  Fall. EXAM: DG HIP (WITH OR WITHOUT PELVIS) 2-3V LEFT COMPARISON:  CT 02/27/2014. FINDINGS: Diffuse osteopenia. Degenerative changes lumbar spine, both SI joints, and both hips. No acute bony abnormality. Surgical sutures noted the pelvis. Large amount of stool noted throughout the colon. IMPRESSION: 1. Diffuse osteopenia. Degenerative changes lumbar spine and both hips. No acute bony abnormality. 2. Large amount of stool noted throughout the colon. Electronically Signed   By: Marcello Moores  Register   On: 07/02/2020 11:54   ECHOCARDIOGRAM LIMITED  Result Date: 07/04/2020    ECHOCARDIOGRAM LIMITED REPORT   Patient Name:   RA CLENDANIEL [facility] Date of Exam: 07/04/2020 Medical Rec #:  [id number]               Height:       61.0 in Accession #:     [id number]              Weight:       110.1 lb Date of Birth:  10/14/1932               BSA:          1.466 m Patient Age:    56 years                BP:           142/70 mmHg Patient Gender: F                       HR:           75 bpm. Exam Location:  Inpatient Procedure: Limited Echo, Cardiac Doppler and Color Doppler Indications:    Syncope  History:        Patient has no prior history of Echocardiogram examinations.                 Arrythmias:Atrial Fibrillation; Risk Factors:Dyslipidemia. GERD.  Sonographer:    Clayton Lefort RDCS (AE) Referring Phys: [id number] [place]  1. Left ventricular ejection fraction, by estimation, is 55 to 60%. The left ventricle has normal function. The left ventricle has no regional wall motion abnormalities. Left ventricular diastolic parameters are consistent with Grade I diastolic dysfunction (impaired relaxation).  2. Right ventricular systolic function is normal. The right ventricular size is normal. There is normal pulmonary artery systolic pressure. The estimated right ventricular systolic pressure is [id number] mmHg.  3. The mitral valve is normal in structure. Trivial mitral valve regurgitation. No evidence of mitral stenosis.  4. The aortic valve is tricuspid. Aortic valve regurgitation is not visualized. No aortic stenosis is present.  5. The inferior vena cava is normal in size with greater than 50% respiratory variability, suggesting right atrial pressure of 3 mmHg. FINDINGS  Left Ventricle: Left ventricular ejection fraction, by estimation, is 55 to 60%. The left ventricle has normal function. The left ventricle has no regional wall motion abnormalities. The left ventricular internal cavity size was normal in size. There is  no left ventricular hypertrophy. Left ventricular diastolic parameters are consistent with Grade I diastolic dysfunction (impaired relaxation). Right Ventricle: The right ventricular size  is normal. No increase in right ventricular wall  thickness. Right ventricular systolic function is normal. There is normal pulmonary artery systolic pressure. The tricuspid regurgitant velocity is 2.23 m/s, and  with an assumed right atrial pressure of 3 mmHg, the estimated right ventricular systolic pressure is 38.2 mmHg. Left Atrium: Left atrial size was normal in size. Right Atrium: Right atrial size was normal in size. Pericardium: Trivial pericardial effusion is present. Mitral Valve: The mitral valve is normal in structure. Trivial mitral valve regurgitation. No evidence of mitral valve stenosis. Tricuspid Valve: The tricuspid valve is normal in structure. Tricuspid valve regurgitation is trivial. Aortic Valve: The aortic valve is tricuspid. Aortic valve regurgitation is not visualized. No aortic stenosis is present. Pulmonic Valve: The pulmonic valve was normal in structure. Pulmonic valve regurgitation is not visualized. Aorta: The aortic root is normal in size and structure. Venous: The inferior vena cava is normal in size with greater than 50% respiratory variability, suggesting right atrial pressure of 3 mmHg. IAS/Shunts: No atrial level shunt detected by color flow Doppler. LEFT VENTRICLE PLAX 2D LVIDd:         4.30 cm  Diastology LVIDs:         2.80 cm  LV e' medial:    5.87 cm/s LV PW:         1.10 cm  LV E/e' medial:  13.5 LV IVS:        1.10 cm  LV e' lateral:   6.85 cm/s LVOT diam:     1.60 cm  LV E/e' lateral: 11.6 LV SV:         39 LV SV Index:   27 LVOT Area:     2.01 cm  IVC IVC diam: 1.40 cm LEFT ATRIUM         Index LA diam:    2.40 cm 1.64 cm/m  AORTIC VALVE LVOT Vmax:   90.20 cm/s LVOT Vmean:  58.400 cm/s LVOT VTI:    0.196 m  AORTA Ao Root diam: 2.90 cm Ao Asc diam:  2.80 cm MITRAL VALVE                TRICUSPID VALVE MV Area (PHT): 4.21 cm     TR Peak grad:   19.9 mmHg MV Decel Time: 180 msec     TR Vmax:        223.00 cm/s MV E velocity: 79.20 cm/s MV A velocity: 119.00 cm/s  SHUNTS MV E/A ratio:  0.67         Systemic VTI:  0.20 m                              Systemic Diam: 1.60 cm Loralie Champagne MD Electronically signed by Loralie Champagne MD Signature Date/Time: 07/04/2020/5:47:16 PM    Final     Microbiology: Recent Results (from the past 240 hour(s))  Urine culture     Status: Abnormal   Collection Time: 07/03/20  2:48 AM   Specimen: Urine, Random  Result Value Ref Range Status   Specimen Description URINE, RANDOM  Final   Special Requests NONE  Final   Culture (A)  Final    <10,000 COLONIES/mL INSIGNIFICANT GROWTH Performed at [facility], [address] [place], [place] [address]    Report Status 07/04/2020 FINAL  Final  Resp Panel by RT-PCR (Flu A&B, Covid) Nasopharyngeal Swab     Status: Abnormal   Collection Time: 07/03/20  4:40 AM   Specimen: Nasopharyngeal Swab; Nasopharyngeal(NP) swabs in vial transport medium  Result Value Ref Range Status   SARS Coronavirus 2 by RT PCR POSITIVE (A) NEGATIVE Final    Comment: RESULT CALLED TO, READ BACK BY AND VERIFIED WITH: JANET VIVEQUEV AT 0612 BY MESSAN ON 07/03/2020 (NOTE) SARS-CoV-2 target nucleic acids are DETECTED.  The SARS-CoV-2 RNA is generally detectable in upper respiratory specimens during the acute phase of infection. Positive results are indicative of the presence of the identified virus, but do not rule out bacterial infection or co-infection with other pathogens not detected by the test. Clinical correlation with patient history and other diagnostic information is necessary to determine patient infection status. The expected result is Negative.  Fact Sheet for Patients: EntrepreneurPulse.com.au  Fact Sheet for Healthcare Providers: IncredibleEmployment.be  This test is not yet approved or cleared by the [place] FDA and  has been authorized for detection and/or diagnosis of SARS-CoV-2 by FDA under an Emergency Use Authorization (EUA).  This EUA will remain in effect (meaning this  test can be used)  for the duration of  the COVID-19 declaration under Section 564(b)(1) of the Act, 21 U.S.C. section 360bbb-3(b)(1), unless the authorization is terminated or revoked sooner.     Influenza A by PCR NEGATIVE NEGATIVE Final   Influenza B by PCR NEGATIVE NEGATIVE Final    Comment: (NOTE) The Xpert Xpress SARS-CoV-2/FLU/RSV plus assay is intended as an aid in the diagnosis of influenza from Nasopharyngeal swab specimens and should not be used as a sole basis for treatment. Nasal washings and aspirates are unacceptable for Xpert Xpress SARS-CoV-2/FLU/RSV testing.  Fact Sheet for Patients: EntrepreneurPulse.com.au  Fact Sheet for Healthcare Providers: IncredibleEmployment.be  This test is not yet approved or cleared by the [place] FDA and has been authorized for detection and/or diagnosis of SARS-CoV-2 by FDA under an Emergency Use Authorization (EUA). This EUA will remain in effect (meaning this test can be used) for the duration of the COVID-19 declaration under Section 564(b)(1) of the Act, 21 U.S.C. section 360bbb-3(b)(1), unless the authorization is terminated or revoked.  Performed at [facility], [place] [address] [place], [place] [address]      Labs: Basic Metabolic Panel: Recent Labs  Lab 07/02/20 1218 07/03/20 0540  NA 134*  --   K 3.9  --   CL 99  --   CO2 25  --   GLUCOSE 100*  --   BUN 7*  --   CREATININE 0.92 0.77  CALCIUM 8.7*  --    Liver Function Tests: No results for input(s): AST, ALT, ALKPHOS, BILITOT, PROT, ALBUMIN in the last 168 hours. No results for input(s): LIPASE, AMYLASE in the last 168 hours. No results for input(s): AMMONIA in the last 168 hours. CBC: Recent Labs  Lab 07/02/20 1218 07/03/20 0540  WBC 5.8 3.4*  HGB 13.4 12.1  HCT 42.7 38.1  MCV 89.1 88.0  PLT 186 166   Cardiac Enzymes: No results for input(s): CKTOTAL, CKMB, CKMBINDEX, TROPONINI in the last 168 hours. BNP: BNP (last  3 results) No results for input(s): BNP in the last 8760 hours.  ProBNP (last 3 results) No results for input(s): PROBNP in the last 8760 hours.  CBG: No results for input(s): GLUCAP in the last 168 hours.  Principal Problem:   Syncope and collapse  Time coordinating discharge: 38 minutes.  Signed:        Brody Bonneau, DO [facility]  07/05/2020, 12:50 PM

## 2020-07-07 ENCOUNTER — Encounter: Payer: Self-pay | Admitting: *Deleted

## 2020-07-07 ENCOUNTER — Telehealth: Payer: Self-pay | Admitting: Internal Medicine

## 2020-07-07 ENCOUNTER — Telehealth: Payer: Self-pay

## 2020-07-07 NOTE — Progress Notes (Signed)
Patient ID: Brenda Hammond, female   DOB: 04-Aug-1932, 87 y.o.   MRN: [id number] Patient enrolled for [employer] to ship a 30 day cardiac event monitor to [address], [place], [place]  [address].  Letter with instructions mailed to same address.

## 2020-07-07 NOTE — Telephone Encounter (Signed)
Transition Care Management Follow-up Telephone Call  Date of discharge and from where: 07/05/20-  How have you been since you were released from the hospital? Tired -per daughter she is eating small amounts today but drinking lots of water  Any questions or concerns? No  Items Reviewed:  Did the pt receive and understand the discharge instructions provided? Yes   Medications obtained and verified? Yes   Other? Yes   Any new allergies since your discharge? No   Dietary orders reviewed? Yes  Do you have support at home? Yes   Home Care and Equipment/Supplies: Were home health services ordered? no If so, what is the name of the agency? n/a  Has the agency set up a time to come to the patient's home? not applicable Were any new equipment or medical supplies ordered?  No What is the name of the medical supply agency? n/a Were you able to get the supplies/equipment? no Do you have any questions related to the use of the equipment or supplies? n/a  Functional Questionnaire: (I = Independent and D = Dependent) ADLs: I with assistance  Bathing/Dressing- I with assistance  Meal Prep- D  Eating- I  Maintaining continence- I with assistance  Transferring/Ambulation- I with assistance  Managing Meds- D  Follow up appointments reviewed:   PCP Hospital f/u appt confirmed? Yes  Scheduled to see Dr. Bryan Lemma on 07/17/20 @ 1:30 .[facility] f/u appt confirmed? Yes  Scheduled    to see Crdiology on 08/03/20 @ 1:20.  Are transportation arrangements needed? No   If their condition worsens, is the pt aware to call PCP or go to the Emergency Dept.? Yes  Was the patient provided with contact information for the PCP's office or ED? Yes  Was to pt encouraged to call back with questions or concerns? Yes

## 2020-07-07 NOTE — Telephone Encounter (Signed)
    [name] said pt is living with her at the moment and she would like to send the heart monitor to their place. She gave address: [place], [place] [address]

## 2020-07-08 ENCOUNTER — Ambulatory Visit: Payer: Medicare Other | Admitting: Podiatry

## 2020-07-10 ENCOUNTER — Telehealth: Payer: Self-pay | Admitting: Family Medicine

## 2020-07-10 NOTE — Telephone Encounter (Signed)
Received FMLA paperwork for pts daughter Brenda Hammond but I'm not able to complete forms without knowing what type of leave she is requesting (continuous, intermittent, to cover work days previously missed, etc), date of leave, etc. I think a VV with Brenda Hammond would be best so I can most accurately and efficiently complete this paperwork.

## 2020-07-10 NOTE — Telephone Encounter (Signed)
lft VM to rtn call to see about scheduling a VV appt to fill out the [facility] paperwork.  Or if it can wait till her upcoming appt on 07/17/20.  Dm/cma

## 2020-07-11 ENCOUNTER — Ambulatory Visit: Payer: Medicare Other | Admitting: Cardiovascular Disease

## 2020-07-12 ENCOUNTER — Ambulatory Visit (INDEPENDENT_AMBULATORY_CARE_PROVIDER_SITE_OTHER): Payer: Medicare Other

## 2020-07-12 DIAGNOSIS — R55 Syncope and collapse: Secondary | ICD-10-CM

## 2020-07-16 ENCOUNTER — Other Ambulatory Visit: Payer: Self-pay

## 2020-07-17 ENCOUNTER — Inpatient Hospital Stay: Payer: Medicare Other | Admitting: Family Medicine

## 2020-07-17 NOTE — Telephone Encounter (Signed)
appt 07/25/20

## 2020-07-22 ENCOUNTER — Ambulatory Visit: Payer: Medicare Other | Admitting: Internal Medicine

## 2020-07-22 ENCOUNTER — Telehealth: Payer: Self-pay

## 2020-07-22 NOTE — Telephone Encounter (Signed)
Pt's daughter calling to inquire about FMLA paperwork and wanted to know the status.  I informed pt that according to notes, the paperwork will be discussed during pt's next visit with Dr. Loletha Grayer.

## 2020-07-24 ENCOUNTER — Other Ambulatory Visit: Payer: Self-pay

## 2020-07-25 ENCOUNTER — Encounter: Payer: Self-pay | Admitting: Family Medicine

## 2020-07-25 ENCOUNTER — Ambulatory Visit (INDEPENDENT_AMBULATORY_CARE_PROVIDER_SITE_OTHER): Payer: Medicare Other | Admitting: Family Medicine

## 2020-07-25 VITALS — BP 118/66 | HR 76 | Temp 97.6°F | Ht 61.0 in | Wt 108.6 lb

## 2020-07-25 DIAGNOSIS — Z2821 Immunization not carried out because of patient refusal: Secondary | ICD-10-CM

## 2020-07-25 DIAGNOSIS — U071 COVID-19: Secondary | ICD-10-CM

## 2020-07-25 DIAGNOSIS — R55 Syncope and collapse: Secondary | ICD-10-CM

## 2020-07-25 DIAGNOSIS — Z09 Encounter for follow-up examination after completed treatment for conditions other than malignant neoplasm: Secondary | ICD-10-CM | POA: Diagnosis not present

## 2020-07-25 DIAGNOSIS — R634 Abnormal weight loss: Secondary | ICD-10-CM

## 2020-07-25 NOTE — Patient Instructions (Signed)
Goal is 24-36oz of water per day!  Carnation instant breakfast shake Boost Ensure  Small amount are fine but every 2 hours

## 2020-07-25 NOTE — Progress Notes (Signed)
Brenda Hammond is a 87 y.o. female  Chief Complaint  Patient presents with  . Hospitalization Follow-up    Hospital f/u from 07/02/20 (covid) and need FMLA paperwork filled out for daughter.   Declines flu and covid.     HPI: Brenda Hammond is a 87 y.o. female who is accompanied by her daughter Brenda Hammond and is seen today for hospital f/u. Pt was admitted [facility] on 07/02/20 and discharged on 07/05/20. Pt had syncopal episode at home, went to ER and was incidentally found to have covid infection. She has f/u appt with cardio Dr. Harrington Challenger on 08/06/19. She is wearing a 30 day holter. She is due for repeat BMP today. Brenda Hammond reports multiple falls starting at the end of 05/2020 - in kitchen while playing cards with husband she started to feel "tired" and then "dizzy", in upstairs of bathroom, in the kitchen a 3rd time (did not pass out) and again 4th time. EMS called and pt taken to ER. Daughter is concerned about pts lack of appetite and weight loss. Pt states she just has a small appetite, eats a few bites and then is full. Will eat again when she is hungry. Both pt and daughter endorse pts loves of sweets. Some "meals" are 1/2 of Klond[employer] bar and decaf coffee with cookies.   Wt Readings from Last 3 Encounters:  07/25/20 108 lb 9.6 oz (49.3 kg)  07/03/20 110 lb 1.6 oz (49.9 kg)  06/30/20 118 lb (53.5 kg)    Past Medical History:  Diagnosis Date  . Anemia   . Anxiety   . Atrial fibrillation ([place])   . Colon adenocarcinoma ([place])   . DJD (degenerative joint disease)   . Fibromyalgia   . GERD (gastroesophageal reflux disease)   . History of poliomyelitis   . History of UTI   . Hyperlipemia   . Memory disorder 03/01/2019  . Other drug allergy(995.27)   . Scoliosis   . Surgical or other procedure not carried out because of patient's decision   . Venous insufficiency     Past Surgical History:  Procedure Laterality Date  . APPENDECTOMY    . LOW ANTERIOR BOWEL RESECTION  06/2009    with anastomosis for colon cancer  . OTHER SURGICAL HISTORY     T & A  . TOTAL ABDOMINAL HYSTERECTOMY W/ BILATERAL SALPINGOOPHORECTOMY  1980s    Social History   Socioeconomic History  . Marital status: Married    Spouse name: Therisa Mennella  . Number of children: 1  . Years of education: 30  . Highest education level: Not on file  Occupational History  . Occupation: Office manager    Comment: Works part time    Employer: [employer]  Tobacco Use  . Smoking status: Never Smoker  . Smokeless tobacco: Never Used  Substance and Sexual Activity  . Alcohol use: No  . Drug use: No  . Sexual activity: Not on file  Other Topics Concern  . Not on file  Social History Narrative   Originally from [place] and has lived with her husband in [place] since 1968.   Fun: Exercises regularly, [place] and have breakfast, walking.   Denies abuse and feels safe at home.       Right handed    Caffeine ~ 3 cups per day    Living with his daughter    Social Determinants of Health   Financial Resource Strain: Low Risk   . Difficulty of Paying Living Expenses:  Not hard at all  Food Insecurity: No Food Insecurity  . Worried About Charity fundraiser in the Last Year: Never true  . Ran Out of Food in the Last Year: Never true  Transportation Needs: No Transportation Needs  . Lack of Transportation (Medical): No  . Lack of Transportation (Non-Medical): No  Physical Activity: Not on file  Stress: Not on file  Social Connections: Not on file  Intimate Partner Violence: Not on file    Family History  Problem Relation Age of Onset  . Stroke Father   . Cancer Mother        brain  . Colon cancer Neg Hx      Immunization History  Administered Date(s) Administered  . Tdap 04/12/2018    Outpatient Encounter Medications as of 07/25/2020  Medication Sig  . Acetaminophen (TYLENOL ARTHRITIS EXT RELIEF PO) Take 650 mg by mouth 2 (two) times daily as needed (pain).  Marland Kitchen alendronate  (FOSAMAX) 70 MG tablet Take 70 mg by mouth every Tuesday.  . Ascorbic Acid (VITAMIN C) 1000 MG tablet Take 1,000 mg by mouth daily.  . bisacodyl (LAXATIVE) 5 MG EC tablet Take 5 mg by mouth daily as needed for moderate constipation.  . Calcium Carbonate (CALCIUM 600 PO) Take 1 tablet by mouth daily.  . calcium carbonate (TUMS - DOSED IN MG ELEMENTAL CALCIUM) 500 MG chewable tablet Chew 1 tablet by mouth daily as needed for indigestion or heartburn.  . cholecalciferol (VITAMIN D) 1000 units tablet Take 1,000 Units by mouth daily.  . Menthol (HALLS COUGH DROPS) 5.8 MG LOZG Use as directed 1 lozenge in the mouth or throat daily as needed (cough).  . Multiple Vitamins-Minerals (MULTIVITAMIN WITH MINERALS) tablet Take 1 tablet by mouth daily.  Marland Kitchen zinc sulfate 220 (50 Zn) MG capsule Take 220 mg by mouth daily.  Marland Kitchen loratadine (CLARITIN) 10 MG tablet Take 10 mg by mouth as needed for allergies or rhinitis.   No facility-administered encounter medications on file as of 07/25/2020.     ROS: Pertinent positives and negatives noted in HPI. Remainder of ROS non-contributory   Allergies  Allergen Reactions  . Sulfa Antibiotics Anaphylaxis    REACTION: elevated HR and BP  . Alendronate Other (See Comments)    "twitching" "twitching"  . Ampicillin     REACTION: rash and high fever  . Aspirin     REACTION: ears ringing  . Cyclobenzaprine Hcl     REACTION: nightmares  . Metaxalone     REACTION: nightmares  . Nitrofurantoin     REACTION: elevated BP and chest pain  . Nsaids     REACTION: rash  . Penicillins     REACTION: rash, high fever  . Sulfonamide Derivatives     REACTION: elevated HR and BP  . Aricept [Donepezil] Rash    Dizziness and cramping  . Nortriptyline Hcl Rash    BP 118/66   Pulse 76   Temp 97.6 F (36.4 C) (Temporal)   Ht 5\' 1"  (1.549 m)   Wt 108 lb 9.6 oz (49.3 kg)   SpO2 99%   BMI 20.52 kg/m    Wt Readings from Last 3 Encounters:  07/25/20 108 lb 9.6 oz (49.3 kg)   07/03/20 110 lb 1.6 oz (49.9 kg)  06/30/20 118 lb (53.5 kg)   Temp Readings from Last 3 Encounters:  07/25/20 97.6 F (36.4 C) (Temporal)  07/05/20 (!) 97.4 F (36.3 C) (Axillary)  04/17/20 (!) 97.4 F (36.3 C) (Temporal)  BP Readings from Last 3 Encounters:  07/25/20 118/66  07/05/20 (!) 154/82  04/17/20 122/70   Pulse Readings from Last 3 Encounters:  07/25/20 76  07/05/20 79  04/17/20 83     Physical Exam Constitutional:      General: She is not in acute distress.    Appearance: Normal appearance. She is not ill-appearing.  Cardiovascular:     Rate and Rhythm: Normal rate and regular rhythm.     Pulses: Normal pulses.  Pulmonary:     Effort: Pulmonary effort is normal. No respiratory distress.     Breath sounds: Normal breath sounds.  Abdominal:     General: Abdomen is flat. Bowel sounds are normal. There is no distension.     Palpations: Abdomen is soft.     Tenderness: There is no abdominal tenderness.  Musculoskeletal:     Right lower leg: No edema.     Left lower leg: No edema.  Neurological:     Mental Status: She is alert. Mental status is at baseline.      A/P:   1. Influenza vaccination declined by patient  2. COVID-19 vaccination declined  3. Hospital discharge follow-up 4. COVID-19 virus infection 5. Syncope, unspecified syncope type - pt wearing holter monitor and has f/u appt with cardio scheduled - no covid symptoms currently - no further syncopal or near-syncopal episodes since prior to hospitalization - Comprehensive metabolic panel - pt feels she is doing fine overall and taking one day at a time. She is not concerned about her recent symptoms. Discussed importance of completing cardiac work-up and pt expressed understanding. - pt and husband are moving in with their daughter Brenda Hammond - FMLA form completed today for pts daughter   6. Weight loss, unintentional - pt eats very little and mainly sweets - discussed importance of  maintaining her weight and getting adequate nutrition and calories - advised small but more frequent healthy snacks and meals, fine for portions to be small but need to eat every 2-3hrs - boost or ensure or other as supplementation - goal of 2 cans per day - Comprehensive metabolic panel - f/u in 2 mo or sooner PRN   This visit occurred during the SARS-CoV-2 public health emergency.  Safety protocols were in place, including screening questions prior to the visit, additional usage of staff PPE, and extensive cleaning of exam room while observing appropriate contact time as indicated for disinfecting solutions.

## 2020-07-26 LAB — COMPREHENSIVE METABOLIC PANEL
AG Ratio: 1.4 (calc) (ref 1.0–2.5)
ALT: 16 U/L (ref 6–29)
AST: 30 U/L (ref 10–35)
Albumin: 3.9 g/dL (ref 3.6–5.1)
Alkaline phosphatase (APISO): 117 U/L (ref 37–153)
BUN/Creatinine Ratio: 8 (calc) (ref 6–22)
BUN: 7 mg/dL (ref 7–25)
CO2: 30 mmol/L (ref 20–32)
Calcium: 9.6 mg/dL (ref 8.6–10.4)
Chloride: 102 mmol/L (ref 98–110)
Creat: 0.91 mg/dL — ABNORMAL HIGH (ref 0.60–0.88)
Globulin: 2.8 g/dL (calc) (ref 1.9–3.7)
Glucose, Bld: 165 mg/dL — ABNORMAL HIGH (ref 65–99)
Potassium: 3.8 mmol/L (ref 3.5–5.3)
Sodium: 143 mmol/L (ref 135–146)
Total Bilirubin: 0.3 mg/dL (ref 0.2–1.2)
Total Protein: 6.7 g/dL (ref 6.1–8.1)

## 2020-08-05 ENCOUNTER — Other Ambulatory Visit: Payer: Self-pay

## 2020-08-05 ENCOUNTER — Ambulatory Visit: Payer: Medicare Other | Admitting: Internal Medicine

## 2020-08-05 ENCOUNTER — Encounter: Payer: Self-pay | Admitting: Internal Medicine

## 2020-08-05 VITALS — BP 132/60 | HR 75 | Ht 61.0 in | Wt 111.6 lb

## 2020-08-05 DIAGNOSIS — R55 Syncope and collapse: Secondary | ICD-10-CM

## 2020-08-05 NOTE — Patient Instructions (Signed)
Medication Instructions:  No changes *If you need a refill on your cardiac medications before your next appointment, please call your pharmacy*   Lab Work: none If you have labs (blood work) drawn today and your tests are completely normal, you will receive your results only by: Marland Kitchen MyChart Message (if you have MyChart) OR . A paper copy in the mail If you have any lab test that is abnormal or we need to change your treatment, we will call you to review the results.   Testing/Procedures: none   Follow-Up: Follow up with your physician will depend on monitor results.   Other Instructions

## 2020-08-05 NOTE — Progress Notes (Signed)
Cardiology Office Note   Date:  08/05/2020   ID:  Ximenna, Fonseca 1933/01/04, MRN [id number]  PCP:  Ronnald Nian, DO  Cardiologist:   Dorris Carnes, MD       History of Present Illness: Kaliope Quinonez is a 87 y.o. female with a history of a brief episode of afib in 2011 after colon surgery (Rx with short course amiodarone), HL, RA.   She was recenlty admitted to [facility] in early January with several episodes of near syncope/syncope  One assocated with abdominal discomfort,one in kitchen, one while sitting SHe was admitted to [facility] to [facility] was placed on telemetry  No arrhythmias detected  Echo showed normal LVEF and RVEF  Since D/C the pt has had no spells of dizziness nor syncope   WIll finish up wearing monitor in a few days.    Her breathing is OK   No SOB           Current Meds  Medication Sig  . Acetaminophen (TYLENOL ARTHRITIS EXT RELIEF PO) Take 650 mg by mouth 2 (two) times daily as needed (pain).  Marland Kitchen alendronate (FOSAMAX) 70 MG tablet Take 70 mg by mouth every Tuesday.  . Ascorbic Acid (VITAMIN C) 1000 MG tablet Take 1,000 mg by mouth daily.  . bisacodyl (LAXATIVE) 5 MG EC tablet Take 5 mg by mouth daily as needed for moderate constipation.  . Calcium Carbonate (CALCIUM 600 PO) Take 1 tablet by mouth daily.  . calcium carbonate (TUMS - DOSED IN MG ELEMENTAL CALCIUM) 500 MG chewable tablet Chew 1 tablet by mouth daily as needed for indigestion or heartburn.  . cholecalciferol (VITAMIN D) 1000 units tablet Take 1,000 Units by mouth daily.  Marland Kitchen loratadine (CLARITIN) 10 MG tablet Take 10 mg by mouth as needed for allergies or rhinitis.  . Menthol (HALLS COUGH DROPS) 5.8 MG LOZG Use as directed 1 lozenge in the mouth or throat daily as needed (cough).  . Multiple Vitamins-Minerals (MULTIVITAMIN WITH MINERALS) tablet Take 1 tablet by mouth daily.  Marland Kitchen zinc sulfate 220 (50 Zn) MG capsule Take 220 mg by mouth daily.      Allergies:   Sulfa antibiotics, Alendronate, Ampicillin, Aspirin, Cyclobenzaprine hcl, Metaxalone, Nitrofurantoin, Nsaids, Penicillins, Sulfonamide derivatives, Aricept [donepezil], and Nortriptyline hcl   Past Medical History:  Diagnosis Date  . Anemia   . Anxiety   . Atrial fibrillation ([place])   . Colon adenocarcinoma ([place])   . DJD (degenerative joint disease)   . Fibromyalgia   . GERD (gastroesophageal reflux disease)   . History of poliomyelitis   . History of UTI   . Hyperlipemia   . Memory disorder 03/01/2019  . Other drug allergy(995.27)   . Scoliosis   . Surgical or other procedure not carried out because of patient's decision   . Venous insufficiency     Past Surgical History:  Procedure Laterality Date  . APPENDECTOMY    . LOW ANTERIOR BOWEL RESECTION  06/2009   with anastomosis for colon cancer  . OTHER SURGICAL HISTORY     T & A  . TOTAL ABDOMINAL HYSTERECTOMY W/ BILATERAL SALPINGOOPHORECTOMY  1980s     Social History:  The patient  reports that she has never smoked. She has never used smokeless tobacco. She reports that she does not drink alcohol and does not use drugs.   Family History:  The patient's family history includes Cancer in her mother; Stroke in  her father.    ROS:  Please see the history of present illness. All other systems are reviewed and  Negative to the above problem except as noted.    PHYSICAL EXAM: VS:  BP 132/60   Pulse 75   Ht 5\' 1"  (1.549 m)   Wt 111 lb 9.6 oz (50.6 kg)   BMI 21.09 kg/m   BP  124/62  P 74   Sitting:  128/64  P 79   Standing   120/58  P 76   Standing 114/60  ) 77 GEN: Well nourished, well developed, in no acute distress  HEENT: normal  Neck: no JVD, carotid bruits, or masses Cardiac: RRR; no murmurs  NO LE  edema  Respiratory:  clear to auscultation bilaterally,  GI: soft, nontender, nondistended, + BS  No hepatomegaly  MS: no deformity Moving all extremities   Skin: warm and dry, no rash Neuro:  Strength  and sensation are intact Psych: euthymic mood, full affect   EKG:  EKG is not ordered today.   Lipid Panel    Component Value Date/Time   CHOL 203 (H) 04/17/2020 1328   CHOL 210 (H) 01/05/2019 1445   TRIG 164.0 (H) 04/17/2020 1328   HDL 53.90 04/17/2020 1328   HDL 51 01/05/2019 1445   CHOLHDL 4 04/17/2020 1328   VLDL 32.8 04/17/2020 1328   LDLCALC 117 (H) 04/17/2020 1328   LDLCALC 130 (H) 01/05/2019 1445   LDLDIRECT 125.0 04/12/2018 1444      Wt Readings from Last 3 Encounters:  08/05/20 111 lb 9.6 oz (50.6 kg)  07/25/20 108 lb 9.6 oz (49.3 kg)  07/03/20 110 lb 1.6 oz (49.9 kg)      ASSESSMENT AND PLAN:  1  Hx of presyncope/syncope   Pt with several spells early in Jan   Had COVID infeciton at the time.   May have been related to this and decrased PO intake     No recurrence   Feels good   Echo normal   She is about done with monitor  Orthostatics negative today     WIll follow up with results  If negative for signif arrhythmia will follow up as needed   2  Hx wt   Daughter says she has last signif weight   ENcoruaged nutritious food.      Current medicines are reviewed at length with the patient today.  The patient does not have concerns regarding medicines.  Signed, Dorris Carnes, MD  08/05/2020 1:43 PM    [facility] [address], [place], [place]  [address] Phone: [phone]; Fax: [phone]

## 2020-08-15 ENCOUNTER — Telehealth: Payer: Self-pay | Admitting: Internal Medicine

## 2020-08-15 NOTE — Telephone Encounter (Signed)
Brenda Hammond is returning Michalene's call in regards to Onesty's results. Please advise.

## 2020-08-15 NOTE — Telephone Encounter (Signed)
See result note documentation, RN spoke with daughter Cordie Grice

## 2020-09-11 ENCOUNTER — Telehealth: Payer: Self-pay | Admitting: Family Medicine

## 2020-09-11 NOTE — Telephone Encounter (Signed)
Melanie(pt's daughter) is wanting a letter excusing her from jury duty on the grounds of her taking care of her parents. She is the primary care giver for her mom and dad. They both are living with her at this time. She is wanting to pick this letter tomorrow afternoon 09/12/20. Please advise [name] if there are any further questions at [phone].

## 2020-09-11 NOTE — Telephone Encounter (Signed)
Please see message and advise.  Thank you. ° °

## 2020-09-11 NOTE — Telephone Encounter (Signed)
Spoke with patient's daughter she stated she will call back to schedule AWV

## 2020-09-12 NOTE — Telephone Encounter (Signed)
Note done and in chart. You should be able to print

## 2020-09-12 NOTE — Telephone Encounter (Signed)
Left message on voicemail to call office.  

## 2020-09-12 NOTE — Telephone Encounter (Signed)
I spoke with daughter and informed her that she can pick up the letter.

## 2020-09-12 NOTE — Telephone Encounter (Signed)
Pt daughter called back and requested call back at your convenience

## 2020-09-29 ENCOUNTER — Other Ambulatory Visit: Payer: Self-pay

## 2020-09-29 ENCOUNTER — Ambulatory Visit (INDEPENDENT_AMBULATORY_CARE_PROVIDER_SITE_OTHER): Payer: Medicare Other | Admitting: Podiatry

## 2020-09-29 DIAGNOSIS — M79675 Pain in left toe(s): Secondary | ICD-10-CM | POA: Diagnosis not present

## 2020-09-29 DIAGNOSIS — M79672 Pain in left foot: Secondary | ICD-10-CM

## 2020-09-29 DIAGNOSIS — Q828 Other specified congenital malformations of skin: Secondary | ICD-10-CM | POA: Diagnosis not present

## 2020-09-29 DIAGNOSIS — M79671 Pain in right foot: Secondary | ICD-10-CM

## 2020-09-29 DIAGNOSIS — M79674 Pain in right toe(s): Secondary | ICD-10-CM | POA: Diagnosis not present

## 2020-09-29 DIAGNOSIS — B351 Tinea unguium: Secondary | ICD-10-CM

## 2020-10-05 ENCOUNTER — Encounter: Payer: Self-pay | Admitting: Podiatry

## 2020-10-05 NOTE — Progress Notes (Signed)
Subjective: Brenda Hammond presents today for follow up of painful porokeratotic lesion(s) b/l feet and painful mycotic toenails b/l that limit ambulation. Aggravating factors include weightbearing with and without shoe gear. Pain for both is relieved with periodic professional debridement.   Her daughter and husband are present during today's visit. Brenda Hammond voices no new pedal problems on today's visit.   She has been hospitalized since her last visit and has followed up with her PCP.   PCP is Dr. Letta Hammond and last visit was 07/25/2020.  Allergies  Allergen Reactions  . Sulfa Antibiotics Anaphylaxis    REACTION: elevated HR and BP  . Alendronate Other (See Comments)    "twitching" "twitching"  . Ampicillin     REACTION: rash and high fever  . Aspirin     REACTION: ears ringing  . Cyclobenzaprine Hcl     REACTION: nightmares  . Metaxalone     REACTION: nightmares  . Nitrofurantoin     REACTION: elevated BP and chest pain  . Nsaids     REACTION: rash  . Penicillins     REACTION: rash, high fever  . Sulfonamide Derivatives     REACTION: elevated HR and BP  . Aricept [Donepezil] Rash    Dizziness and cramping  . Nortriptyline Hcl Rash    Objective: There were no vitals filed for this visit.  Pt 87 y.o. year old female  in NAD. AAO x 3.   Vascular Examination:  Capillary refill time to digits immediate b/l. Palpable DP pulses b/l. Palpable PT pulses b/l. Pedal hair absent b/l Skin temperature gradient within normal limits b/l.  Dermatological Examination: Pedal skin with normal turgor, texture and tone bilaterally. No open wounds bilaterally. No interdigital macerations bilaterally. Toenails 1-5 b/l elongated, dystrophic, thickened, crumbly with subungual debris and tenderness to dorsal palpation. Porokeratotic lesion(s) submet head 2 right foot, submet head 3 right foot and submet head 5 left foot. No erythema, no edema, no drainage, no  flocculence.  Musculoskeletal: Normal muscle strength 5/5 to all lower extremity muscle groups bilaterally, no pain crepitus or joint limitation noted with ROM b/l and hammertoes noted to the  2-5 bilaterally  Neurological: Protective sensation intact 5/5 intact bilaterally with 10g monofilament b/l Vibratory sensation intact b/l Proprioception intact bilaterally  Assessment: 1. Pain due to onychomycosis of toenails of both feet   2. Porokeratosis   3. Pain in both feet    Plan: -Toenails 1-5 b/l were debrided in length and girth with sterile nail nippers and dremel without iatrogenic bleeding.  -Painful porokeratotic lesion(s) submet head 2 right foot, submet head 3 right foot and submet head 5 left foot pared and enucleated with sterile scalpel blade without incident. -Patient to continue soft, supportive shoe gear daily. -Patient to report any pedal injuries to medical professional immediately. -Patient/POA to call should there be question/concern in the interim.  Return in about 3 months (around 12/29/2020).

## 2020-10-17 ENCOUNTER — Other Ambulatory Visit: Payer: Self-pay

## 2020-10-17 ENCOUNTER — Ambulatory Visit (INDEPENDENT_AMBULATORY_CARE_PROVIDER_SITE_OTHER): Payer: Medicare Other | Admitting: Family Medicine

## 2020-10-17 ENCOUNTER — Encounter: Payer: Self-pay | Admitting: Family Medicine

## 2020-10-17 VITALS — BP 110/68 | HR 66 | Temp 97.9°F | Ht 61.0 in | Wt 114.6 lb

## 2020-10-17 DIAGNOSIS — R7301 Impaired fasting glucose: Secondary | ICD-10-CM

## 2020-10-17 DIAGNOSIS — R413 Other amnesia: Secondary | ICD-10-CM

## 2020-10-17 DIAGNOSIS — R634 Abnormal weight loss: Secondary | ICD-10-CM | POA: Diagnosis not present

## 2020-10-17 DIAGNOSIS — M8589 Other specified disorders of bone density and structure, multiple sites: Secondary | ICD-10-CM

## 2020-10-17 DIAGNOSIS — E559 Vitamin D deficiency, unspecified: Secondary | ICD-10-CM

## 2020-10-17 NOTE — Progress Notes (Signed)
Brenda Hammond is a 87 y.o. female  Chief Complaint  Patient presents with  . Follow-up    Pt here for 6 mo/fu    HPI: Brenda Hammond is a 87 y.o. female patient, accompanied by her husband Mallie Mussel and daughter [name], who is seen today for 36mo f/u on chronic medical conditions including unintentional weight loss, cognitive decline, HLD, osteopenia. She and husband live with her daughter.  She is on fosamax weekly, as well as daily calcium and Vit D.  She is not on any cholesterol-lowering medication. Diet: 1 can of boost, 24oz of water daily, lots of sweets  Wt Readings from Last 3 Encounters:  10/17/20 114 lb 9.6 oz (52 kg)  08/05/20 111 lb 9.6 oz (50.6 kg)  07/25/20 108 lb 9.6 oz (49.3 kg)   Lab Results  Component Value Date   CHOL 203 (H) 04/17/2020   HDL 53.90 04/17/2020   LDLCALC 117 (H) 04/17/2020   LDLDIRECT 125.0 04/12/2018   TRIG 164.0 (H) 04/17/2020   CHOLHDL 4 04/17/2020   Lab Results  Component Value Date   NA 143 07/25/2020   K 3.8 07/25/2020   CREATININE 0.91 (H) 07/25/2020   GFRNONAA >60 07/03/2020   GFRAA 77 01/05/2019   GLUCOSE 165 (H) 07/25/2020   Lab Results  Component Value Date   HGBA1C 5.9 04/17/2018   Last vitamin D Lab Results  Component Value Date   VD25OH 38.52 04/17/2020   Lab Results  Component Value Date   ALT 16 07/25/2020   AST 30 07/25/2020   ALKPHOS 98 04/17/2020   BILITOT 0.3 07/25/2020     Past Medical History:  Diagnosis Date  . Anemia   . Anxiety   . Atrial fibrillation ([place])   . Colon adenocarcinoma ([place])   . DJD (degenerative joint disease)   . Fibromyalgia   . GERD (gastroesophageal reflux disease)   . History of poliomyelitis   . History of UTI   . Hyperlipemia   . Memory disorder 03/01/2019  . Other drug allergy(995.27)   . Scoliosis   . Surgical or other procedure not carried out because of patient's decision   . Venous insufficiency     Past Surgical History:  Procedure Laterality  Date  . APPENDECTOMY    . LOW ANTERIOR BOWEL RESECTION  06/2009   with anastomosis for colon cancer  . OTHER SURGICAL HISTORY     T & A  . TOTAL ABDOMINAL HYSTERECTOMY W/ BILATERAL SALPINGOOPHORECTOMY  1980s    Social History   Socioeconomic History  . Marital status: Married    Spouse name: Aviyah Desso  . Number of children: 1  . Years of education: 104  . Highest education level: Not on file  Occupational History  . Occupation: Office manager    Comment: Works part time    Employer: [employer]  Tobacco Use  . Smoking status: Never Smoker  . Smokeless tobacco: Never Used  Substance and Sexual Activity  . Alcohol use: No  . Drug use: No  . Sexual activity: Not on file  Other Topics Concern  . Not on file  Social History Narrative   Originally from [facility] and has lived with her husband in [place] since 1968.   Fun: Exercises regularly, [place] and have breakfast, walking.   Denies abuse and feels safe at home.       Right handed    Caffeine ~ 3 cups per day    Living with his daughter  Social Determinants of Health   Financial Resource Strain: Not on file  Food Insecurity: Not on file  Transportation Needs: Not on file  Physical Activity: Not on file  Stress: Not on file  Social Connections: Not on file  Intimate Partner Violence: Not on file    Family History  Problem Relation Age of Onset  . Stroke Father   . Cancer Mother        brain  . Colon cancer Neg Hx      Immunization History  Administered Date(s) Administered  . Tdap 04/12/2018    Outpatient Encounter Medications as of 10/17/2020  Medication Sig  . Acetaminophen (TYLENOL ARTHRITIS EXT RELIEF PO) Take 650 mg by mouth 2 (two) times daily as needed (pain).  [place] Kitchen alendronate (FOSAMAX) 70 MG tablet Take 70 mg by mouth every Tuesday.  . Ascorbic Acid (VITAMIN C) 1000 MG tablet Take 1,000 mg by mouth daily.  . bisacodyl (LAXATIVE) 5 MG EC tablet Take 5 mg by mouth daily as needed for  moderate constipation.  . Calcium Carbonate (CALCIUM 600 PO) Take 1 tablet by mouth daily.  . calcium carbonate (TUMS - DOSED IN MG ELEMENTAL CALCIUM) 500 MG chewable tablet Chew 1 tablet by mouth daily as needed for indigestion or heartburn.  . cholecalciferol (VITAMIN D) 1000 units tablet Take 1,000 Units by mouth daily.  Marland Kitchen loratadine (CLARITIN) 10 MG tablet Take 10 mg by mouth as needed for allergies or rhinitis.  . Menthol (HALLS COUGH DROPS) 5.8 MG LOZG Use as directed 1 lozenge in the mouth or throat daily as needed (cough).  . Multiple Vitamins-Minerals (MULTIVITAMIN WITH MINERALS) tablet Take 1 tablet by mouth daily.  Marland Kitchen zinc sulfate 220 (50 Zn) MG capsule Take 220 mg by mouth daily.   No facility-administered encounter medications on file as of 10/17/2020.     ROS: Pertinent positives and negatives noted in HPI. Remainder of ROS non-contributory    Allergies  Allergen Reactions  . Sulfa Antibiotics Anaphylaxis    REACTION: elevated HR and BP  . Alendronate Other (See Comments)    "twitching" "twitching"  . Ampicillin     REACTION: rash and high fever  . Aspirin     REACTION: ears ringing  . Cyclobenzaprine Hcl     REACTION: nightmares  . Metaxalone     REACTION: nightmares  . Nitrofurantoin     REACTION: elevated BP and chest pain  . Nsaids     REACTION: rash  . Penicillins     REACTION: rash, high fever  . Sulfonamide Derivatives     REACTION: elevated HR and BP  . Aricept [Donepezil] Rash    Dizziness and cramping  . Nortriptyline Hcl Rash    BP 110/68 (BP Location: Left Arm, Patient Position: Sitting, Cuff Size: Normal)   Pulse 66   Temp 97.9 F (36.6 C) (Oral)   Ht 5\' 1"  (1.549 m)   Wt 114 lb 9.6 oz (52 kg)   SpO2 99%   BMI 21.65 kg/m   Wt Readings from Last 3 Encounters:  10/17/20 114 lb 9.6 oz (52 kg)  08/05/20 111 lb 9.6 oz (50.6 kg)  07/25/20 108 lb 9.6 oz (49.3 kg)   Temp Readings from Last 3 Encounters:  10/17/20 97.9 F (36.6 C) (Oral)   07/25/20 97.6 F (36.4 C) (Temporal)  07/05/20 (!) 97.4 F (36.3 C) (Axillary)   BP Readings from Last 3 Encounters:  10/17/20 110/68  08/05/20 132/60  07/25/20 118/66   Pulse Readings from  Last 3 Encounters:  10/17/20 66  08/05/20 75  07/25/20 76     Physical Exam Constitutional:      Appearance: Normal appearance.  Cardiovascular:     Rate and Rhythm: Normal rate and regular rhythm.     Pulses: Normal pulses.  Pulmonary:     Effort: Pulmonary effort is normal.     Breath sounds: Normal breath sounds. No wheezing or rhonchi.  Abdominal:     General: Bowel sounds are normal. There is no distension.     Palpations: Abdomen is soft.     Tenderness: There is no abdominal tenderness.  Musculoskeletal:     Cervical back: Neck supple.     Right lower leg: No edema.     Left lower leg: No edema.  Lymphadenopathy:     Cervical: No cervical adenopathy.  Neurological:     Mental Status: She is alert. Mental status is at baseline.  Psychiatric:        Mood and Affect: Mood normal.        Behavior: Behavior normal.      A/P:  1. Weight loss, unintentional - pt has gained 3lbs since last OV 2/60mo ago - increase boost to 2 per day - cont with high protein, healthy fat foods  2. Memory disorder - stable, has and continues to decline Rx med  3. IFG (impaired fasting glucose) Lab Results  Component Value Date   HGBA1C 5.9 04/17/2018  - pt eats a lot of sweets and is unwilling to change this behavior  4. Vitamin D deficiency - on Vit D 1000IU daily  5. Osteopenia of multiple sites - on fosamax - dexa due in 12/2021    This visit occurred during the SARS-CoV-2 public health emergency.  Safety protocols were in place, including screening questions prior to the visit, additional usage of staff PPE, and extensive cleaning of exam room while observing appropriate contact time as indicated for disinfecting solutions.

## 2020-10-23 ENCOUNTER — Telehealth: Payer: Self-pay | Admitting: Family Medicine

## 2020-10-23 NOTE — Telephone Encounter (Signed)
Left message for patient to schedule Annual Wellness Visit.  Please schedule with Nurse Health Advisor Martha Stanley, RN at  [place]  °

## 2020-11-20 ENCOUNTER — Telehealth: Payer: Self-pay | Admitting: Family Medicine

## 2020-11-20 NOTE — Telephone Encounter (Signed)
Left message for patient to call back and schedule Medicare Annual Wellness Visit (AWV) in office.   If not able to come in office, please offer to do virtually or by telephone.   Last AWV:10/24/2019  Please schedule at anytime with Nurse Health Advisor.   

## 2020-12-22 ENCOUNTER — Telehealth: Payer: Self-pay

## 2020-12-22 ENCOUNTER — Other Ambulatory Visit: Payer: Self-pay | Admitting: Family Medicine

## 2020-12-22 NOTE — Telephone Encounter (Signed)
New message    Checking on the status of FMLA paperwork deadline is  12/31/2020.

## 2020-12-23 NOTE — Telephone Encounter (Signed)
Last OV 10/17/20 Last fill 10/04/*21  Historical provider

## 2020-12-25 NOTE — Telephone Encounter (Signed)
I called the pt's daughter back and informed her that I have not seen any FMLA forms for her mom.  Daughter informed me that the forms will be re faxed.

## 2020-12-31 NOTE — Telephone Encounter (Signed)
Pts daughter is calling again to see if we rec'd FMLA paperwork that were to be faxed. She said you can lvm, she will be sleeping. If you did not receive them she can drop a copy of tomorrow morning when she gets off work.  Thanks

## 2021-01-01 ENCOUNTER — Telehealth: Payer: Self-pay | Admitting: Family Medicine

## 2021-01-01 NOTE — Telephone Encounter (Signed)
Folder placed in Dr. Lurline Del basket.  Pt aware that Dr. Loletha Grayer is ooo this week.

## 2021-01-01 NOTE — Telephone Encounter (Signed)
Left a detailed message on voicemail. Informing pt's daughter that Dr. Loletha Grayer is out of the office until next Wednesday.  I would like to know if she can wait that long to receive forms back.

## 2021-01-01 NOTE — Telephone Encounter (Signed)
She said you can call her back and just leave a detailed message on what we are able to do

## 2021-01-01 NOTE — Telephone Encounter (Signed)
Pts daughter called back and said that they need it done as soon as possible and if someone else could please fill it out, she said it was originally due yesterday but there was trouble with them sending it to [place]. She said if that's not at all possible then she said she guesses she will have to wait

## 2021-01-01 NOTE — Telephone Encounter (Signed)
Pt dropped off FMLA form to fill out, I put in folder up front

## 2021-01-05 NOTE — Telephone Encounter (Signed)
Forms place on your desk.

## 2021-01-06 ENCOUNTER — Ambulatory Visit: Payer: Medicare Other | Admitting: Podiatry

## 2021-01-06 ENCOUNTER — Encounter: Payer: Self-pay | Admitting: Podiatry

## 2021-01-06 ENCOUNTER — Other Ambulatory Visit: Payer: Self-pay

## 2021-01-06 DIAGNOSIS — M79672 Pain in left foot: Secondary | ICD-10-CM

## 2021-01-06 DIAGNOSIS — B351 Tinea unguium: Secondary | ICD-10-CM

## 2021-01-06 DIAGNOSIS — M79674 Pain in right toe(s): Secondary | ICD-10-CM | POA: Diagnosis not present

## 2021-01-06 DIAGNOSIS — M79671 Pain in right foot: Secondary | ICD-10-CM | POA: Diagnosis not present

## 2021-01-06 DIAGNOSIS — Q828 Other specified congenital malformations of skin: Secondary | ICD-10-CM | POA: Diagnosis not present

## 2021-01-06 DIAGNOSIS — M79675 Pain in left toe(s): Secondary | ICD-10-CM | POA: Diagnosis not present

## 2021-01-07 NOTE — Telephone Encounter (Signed)
Dr.Cirigliano is filling out the paperwork from [employer] and she will fax off today. Pt is aware that this will be handled today.

## 2021-01-07 NOTE — Telephone Encounter (Signed)
Pt daughter called back and just wanted to be sure this can be addressed today because they keep contacting her because its late which she said she understands its her fault for bringing it late. Please advise

## 2021-01-07 NOTE — Telephone Encounter (Signed)
Completed and faxed.

## 2021-01-08 NOTE — Telephone Encounter (Signed)
Paperwork complete and faxed off.

## 2021-01-09 NOTE — Progress Notes (Signed)
Subjective: Brenda Hammond presents today for follow up of painful porokeratotic lesion(s) b/l feet and painful mycotic toenails b/l that limit ambulation. Aggravating factors include weightbearing with and without shoe gear. Pain for both is relieved with periodic professional debridement.   She was brought to her appointment by her son in law. Brenda Hammond voices no new pedal problems on today's visit.  PCP is Dr. Letta Hammond and last visit was 11/20/2020.  She voices no new pedal problems on today's visit.  Allergies  Allergen Reactions   Sulfa Antibiotics Anaphylaxis    REACTION: elevated HR and BP   Alendronate Other (See Comments)    "twitching" "twitching"   Ampicillin     REACTION: rash and high fever   Aspirin     REACTION: ears ringing   Cyclobenzaprine Hcl     REACTION: nightmares   Metaxalone     REACTION: nightmares   Nitrofurantoin     REACTION: elevated BP and chest pain   Nsaids     REACTION: rash   Penicillins     REACTION: rash, high fever   Sulfonamide Derivatives     REACTION: elevated HR and BP   Aricept [Donepezil] Rash    Dizziness and cramping   Nortriptyline Hcl Rash    Objective: There were no vitals filed for this visit.  Pt 87 y.o. year old female  in NAD. AAO x 3.   Vascular Examination:  Capillary refill time to digits immediate b/l. Palpable DP pulses b/l. Palpable PT pulses b/l. Pedal hair absent b/l Skin temperature gradient within normal limits b/l.  Dermatological Examination: Pedal skin with normal turgor, texture and tone b/l lower extremities No open wounds b/l lower extremities No interdigital macerations b/l lower extremities Toenails 1-5 b/l elongated, discolored, dystrophic, thickened, crumbly with subungual debris and tenderness to dorsal palpation. Porokeratotic lesion(s) submet head 3 right foot and submet head 4 right foot. No erythema, no edema, no drainage, no fluctuance.  Musculoskeletal: Normal muscle  strength 5/5 to all lower extremity muscle groups bilaterally, no pain crepitus or joint limitation noted with ROM b/l and hammertoes noted to the  2-5 bilaterally  Neurological: Protective sensation intact 5/5 intact bilaterally with 10g monofilament b/l Vibratory sensation intact b/l Proprioception intact bilaterally  Assessment: 1. Pain due to onychomycosis of toenails of both feet   2. Porokeratosis   3. Pain in both feet    Plan: -Examined patient. -Toenails 1-5 b/l were debrided in length and girth with sterile nail nippers and dremel without iatrogenic bleeding.  -Painful porokeratotic lesion(s) submet head 3 right foot and submet head 4 right foot pared and enucleated with sterile scalpel blade without incident. Total number of lesions debrided=2. -Patient to report any pedal injuries to medical professional immediately.  Return in about 3 months (around 04/08/2021).

## 2021-02-09 DIAGNOSIS — Z961 Presence of intraocular lens: Secondary | ICD-10-CM | POA: Diagnosis not present

## 2021-03-06 ENCOUNTER — Telehealth: Payer: Self-pay

## 2021-03-06 NOTE — Telephone Encounter (Signed)
Lft VM to rtn call to schedule an AWV. Dm/cma

## 2021-03-16 ENCOUNTER — Other Ambulatory Visit: Payer: Self-pay

## 2021-03-16 MED ORDER — ALENDRONATE SODIUM 70 MG PO TABS: ORAL_TABLET | ORAL | 0 refills | Status: DC

## 2021-03-16 NOTE — Telephone Encounter (Signed)
Refill request for: Alendronate 70 mg tabs LR 12/23/20, # 12, 0 rfs LOV 10/17/20  FOV 04/23/21  Please review and advise.  Thanks.  Dm/cma

## 2021-04-08 ENCOUNTER — Other Ambulatory Visit: Payer: Self-pay

## 2021-04-08 ENCOUNTER — Encounter: Payer: Self-pay | Admitting: Podiatry

## 2021-04-08 ENCOUNTER — Ambulatory Visit: Payer: Medicare Other | Admitting: Podiatry

## 2021-04-08 DIAGNOSIS — B351 Tinea unguium: Secondary | ICD-10-CM

## 2021-04-08 DIAGNOSIS — M79675 Pain in left toe(s): Secondary | ICD-10-CM | POA: Diagnosis not present

## 2021-04-08 DIAGNOSIS — M79674 Pain in right toe(s): Secondary | ICD-10-CM | POA: Diagnosis not present

## 2021-04-12 NOTE — Progress Notes (Signed)
Subjective: Brenda Hammond is a 87 y.o. female patient seen today for follow up of  painful thick toenails that are difficult to trim. Pain interferes with ambulation. Aggravating factors include wearing enclosed shoe gear. Pain is relieved with periodic professional debridement.  New problems reported today: None.  She is accompanied by her daughter on today's visit.  PCP is Brenda Nian, DO. Last visit was: 10/17/2020.  Allergies  Allergen Reactions   Sulfa Antibiotics Anaphylaxis    REACTION: elevated HR and BP   Alendronate Other (See Comments)    "twitching" "twitching"   Ampicillin     REACTION: rash and high fever   Aspirin     REACTION: ears ringing   Cyclobenzaprine Hcl     REACTION: nightmares   Metaxalone     REACTION: nightmares   Nitrofurantoin     REACTION: elevated BP and chest pain   Nsaids     REACTION: rash   Penicillins     REACTION: rash, high fever   Sulfonamide Derivatives     REACTION: elevated HR and BP   Aricept [Donepezil] Rash    Dizziness and cramping   Nortriptyline Hcl Rash    PCP is Cirigliano, Mary K, DO .  Objective: Physical Exam  General: Patient is a pleasant 87 y.o. Caucasian female WD, WN in NAD. AAO x 3.   Neurovascular Examination: Capillary refill time to digits immediate b/l. Palpable pedal pulses b/l LE. Pedal hair absent. Lower extremity skin temperature gradient within normal limits. No pain with calf compression b/l. No edema noted b/l lower extremities.  Protective sensation intact 5/5 intact bilaterally with 10g monofilament b/l. Vibratory sensation intact b/l.  Dermatological:  Pedal skin thin, shiny and atrophic b/l LE. No open wounds b/l LE. No interdigital macerations b/l lower extremities. Toenails 1-5 b/l elongated, discolored, dystrophic, thickened, crumbly with subungual debris and tenderness to dorsal palpation.  Musculoskeletal:  Normal muscle strength 5/5 to all lower extremity muscle groups  bilaterally. Hammertoe(s) noted to the 2-5 bilaterally.  Assessment: 1. Pain due to onychomycosis of toenails of both feet    Plan: Patient was evaluated and treated and all questions answered. Consent given for treatment as described below: -Examined patient. -Patient to continue soft, supportive shoe gear daily. -Toenails 1-5 b/l were debrided in length and girth with sterile nail nippers and dremel without iatrogenic bleeding.  -Patient to report any pedal injuries to medical professional immediately. -Patient/POA to call should there be question/concern in the interim.  Return in about 3 months (around 07/09/2021).  Marzetta Board, DPM

## 2021-04-22 ENCOUNTER — Ambulatory Visit: Payer: Medicare Other | Admitting: Family Medicine

## 2021-04-23 ENCOUNTER — Encounter: Payer: Self-pay | Admitting: Family Medicine

## 2021-04-23 ENCOUNTER — Ambulatory Visit (INDEPENDENT_AMBULATORY_CARE_PROVIDER_SITE_OTHER): Payer: Medicare Other | Admitting: Family Medicine

## 2021-04-23 ENCOUNTER — Other Ambulatory Visit: Payer: Self-pay

## 2021-04-23 VITALS — BP 110/64 | HR 70 | Temp 97.5°F | Ht 61.0 in | Wt 120.2 lb

## 2021-04-23 DIAGNOSIS — R413 Other amnesia: Secondary | ICD-10-CM | POA: Diagnosis not present

## 2021-04-23 DIAGNOSIS — R7301 Impaired fasting glucose: Secondary | ICD-10-CM | POA: Insufficient documentation

## 2021-04-23 DIAGNOSIS — M810 Age-related osteoporosis without current pathological fracture: Secondary | ICD-10-CM | POA: Diagnosis not present

## 2021-04-23 DIAGNOSIS — E782 Mixed hyperlipidemia: Secondary | ICD-10-CM | POA: Diagnosis not present

## 2021-04-23 LAB — URINALYSIS, ROUTINE W REFLEX MICROSCOPIC
Bilirubin Urine: NEGATIVE
Hgb urine dipstick: NEGATIVE
Ketones, ur: NEGATIVE
Leukocytes,Ua: NEGATIVE
Nitrite: NEGATIVE
Specific Gravity, Urine: >=1.03 — AB (ref 1.000–1.030)
Total Protein, Urine: NEGATIVE
Urine Glucose: NEGATIVE
Urobilinogen, UA: 0.2 (ref 0.0–1.0)
pH: 5.5 (ref 5.0–8.0)

## 2021-04-23 NOTE — Addendum Note (Signed)
Addended by: Lynnea Ferrier on: 04/23/2021 02:52 PM   Modules accepted: Orders

## 2021-04-23 NOTE — Progress Notes (Signed)
Established Patient Office Visit  Subjective:  Patient ID: Brenda Hammond, female    DOB: December 13, 1932  Age: 87 y.o. MRN: [id number]  CC:  Chief Complaint  Patient presents with   Transitions Of Care    TOC from Dr. Cathleen Corti, no concerns.     HPI Brenda Hammond presents for establishment of care by way of transfer.  She is accompanied by her husband and daughter [name].  Past medical history to include osteoporosis, mixed hyperlipidemia, elevated glucose and memory decline.  Daughter [name] who is a nurse reports that the patient often repeats herself.  Sometimes has difficulty getting dressed with close that are inside out.  She does not wander or exhibit any problem behaviors.  Takes alendronate for osteoporosis.  Has difficulty taking required amounts of vitamin D and calcium.  LDL cholesterol has been mildly elevated.  Has difficulty fasting for blood work.  Past Medical History:  Diagnosis Date   Anemia    Anxiety    Atrial fibrillation (HCC)    Colon adenocarcinoma (HCC)    DJD (degenerative joint disease)    Fibromyalgia    GERD (gastroesophageal reflux disease)    History of poliomyelitis    History of UTI    Hyperlipemia    Memory disorder 03/01/2019   Other drug allergy(995.27)    Scoliosis    Surgical or other procedure not carried out because of patient's decision    Venous insufficiency     Past Surgical History:  Procedure Laterality Date   APPENDECTOMY     LOW ANTERIOR BOWEL RESECTION  06/2009   with anastomosis for colon cancer   OTHER SURGICAL HISTORY     T & A   TOTAL ABDOMINAL HYSTERECTOMY W/ BILATERAL SALPINGOOPHORECTOMY  1980s    Family History  Problem Relation Age of Onset   Stroke Father    Cancer Mother        brain   Colon cancer Neg Hx     Social History   Socioeconomic History   Marital status: Married    Spouse name: Anastashia Westerfeld   Number of children: 1   Years of education: 15   Highest education  level: Not on file  Occupational History   Occupation: Office manager    Comment: Works part time    Fish farm manager: [employer]  Tobacco Use   Smoking status: Never   Smokeless tobacco: Never  Substance and Sexual Activity   Alcohol use: No   Drug use: No   Sexual activity: Not on file  Other Topics Concern   Not on file  Social History Narrative   Originally from [place] and has lived with her husband in [place] since 1968.   Fun: Exercises regularly, [place] and have breakfast, walking.   Denies abuse and feels safe at home.       Right handed    Caffeine ~ 3 cups per day    Living with his daughter    Social Determinants of Health   Financial Resource Strain: Not on file  Food Insecurity: Not on file  Transportation Needs: Not on file  Physical Activity: Not on file  Stress: Not on file  Social Connections: Not on file  Intimate Partner Violence: Not on file    Outpatient Medications Prior to Visit  Medication Sig Dispense Refill   Acetaminophen (TYLENOL ARTHRITIS EXT RELIEF PO) Take 650 mg by mouth 2 (two) times daily as needed (pain).     alendronate (FOSAMAX) 70 MG  tablet TAKE 1 TABLET(70 MG) BY MOUTH EVERY 7 DAYS WITH A FULL GLASS OF WATER AND ON AN EMPTY STOMACH 12 tablet 0   bisacodyl (LAXATIVE) 5 MG EC tablet Take 5 mg by mouth daily as needed for moderate constipation.     loratadine (CLARITIN) 10 MG tablet Take 10 mg by mouth as needed for allergies or rhinitis.     Menthol (HALLS COUGH DROPS) 5.8 MG LOZG Use as directed 1 lozenge in the mouth or throat daily as needed (cough).     Ascorbic Acid (VITAMIN C) 1000 MG tablet Take 1,000 mg by mouth daily. (Patient not taking: Reported on 04/23/2021)     Calcium Carbonate (CALCIUM 600 PO) Take 1 tablet by mouth daily. (Patient not taking: Reported on 04/23/2021)     calcium carbonate (TUMS - DOSED IN MG ELEMENTAL CALCIUM) 500 MG chewable tablet Chew 1 tablet by mouth daily as needed for indigestion or heartburn.  (Patient not taking: Reported on 04/23/2021)     cholecalciferol (VITAMIN D) 1000 units tablet Take 1,000 Units by mouth daily. (Patient not taking: Reported on 04/23/2021)     Multiple Vitamins-Minerals (MULTIVITAMIN WITH MINERALS) tablet Take 1 tablet by mouth daily. (Patient not taking: Reported on 04/23/2021)     zinc sulfate 220 (50 Zn) MG capsule Take 220 mg by mouth daily. (Patient not taking: Reported on 04/23/2021)     No facility-administered medications prior to visit.    Allergies  Allergen Reactions   Sulfa Antibiotics Anaphylaxis    REACTION: elevated HR and BP   Alendronate Other (See Comments)    "twitching" "twitching"   Ampicillin     REACTION: rash and high fever   Aspirin     REACTION: ears ringing   Cyclobenzaprine Hcl     REACTION: nightmares   Metaxalone     REACTION: nightmares   Nitrofurantoin     REACTION: elevated BP and chest pain   Nsaids     REACTION: rash   Penicillins     REACTION: rash, high fever   Sulfonamide Derivatives     REACTION: elevated HR and BP   Aricept [Donepezil] Rash    Dizziness and cramping   Nortriptyline Hcl Rash    ROS Review of Systems  Constitutional:  Negative for diaphoresis, fatigue, fever and unexpected weight change.  HENT: Negative.    Respiratory: Negative.    Cardiovascular: Negative.   Gastrointestinal: Negative.   Musculoskeletal:  Positive for arthralgias and gait problem.  Neurological:  Negative for facial asymmetry, speech difficulty and weakness.  Psychiatric/Behavioral:  Positive for confusion.      Objective:    Physical Exam Vitals and nursing note reviewed.  Constitutional:      General: She is not in acute distress.    Appearance: Normal appearance. She is not ill-appearing, toxic-appearing or diaphoretic.  HENT:     Head: Normocephalic and atraumatic.     Right Ear: External ear normal.     Left Ear: External ear normal.  Eyes:     General: No scleral icterus.       Right eye: No  discharge.        Left eye: No discharge.     Extraocular Movements: Extraocular movements intact.     Conjunctiva/sclera: Conjunctivae normal.  Cardiovascular:     Rate and Rhythm: Normal rate and regular rhythm.  Pulmonary:     Effort: Pulmonary effort is normal.     Breath sounds: Normal breath sounds.  Skin:  General: Skin is warm and dry.  Neurological:     Mental Status: She is alert and oriented to person, place, and time.     Cranial Nerves: No dysarthria or facial asymmetry.  Psychiatric:        Mood and Affect: Mood normal.        Behavior: Behavior normal.    BP 110/64 (BP Location: Left Arm, Patient Position: Sitting, Cuff Size: Normal)   Pulse 70   Temp (!) 97.5 F (36.4 C) (Temporal)   Ht 5\' 1"  (1.549 m)   Wt 120 lb 3.2 oz (54.5 kg)   SpO2 99%   BMI 22.71 kg/m  Wt Readings from Last 3 Encounters:  04/23/21 120 lb 3.2 oz (54.5 kg)  10/17/20 114 lb 9.6 oz (52 kg)  08/05/20 111 lb 9.6 oz (50.6 kg)     Health Maintenance Due  Topic Date Due   Pneumonia Vaccine 33+ Years old (1 - PCV) Never done    There are no preventive care reminders to display for this patient.  Lab Results  Component Value Date   TSH 0.558 07/03/2020   Lab Results  Component Value Date   WBC 3.4 (L) 07/03/2020   HGB 12.1 07/03/2020   HCT 38.1 07/03/2020   MCV 88.0 07/03/2020   PLT 166 07/03/2020   Lab Results  Component Value Date   NA 143 07/25/2020   K 3.8 07/25/2020   CO2 30 07/25/2020   GLUCOSE 165 (H) 07/25/2020   BUN 7 07/25/2020   CREATININE 0.91 (H) 07/25/2020   BILITOT 0.3 07/25/2020   ALKPHOS 98 04/17/2020   AST 30 07/25/2020   ALT 16 07/25/2020   PROT 6.7 07/25/2020   ALBUMIN 3.9 04/17/2020   CALCIUM 9.6 07/25/2020   ANIONGAP 10 07/02/2020   GFR 54.08 (L) 04/17/2020   Lab Results  Component Value Date   CHOL 203 (H) 04/17/2020   Lab Results  Component Value Date   HDL 53.90 04/17/2020   Lab Results  Component Value Date   LDLCALC 117 (H)  04/17/2020   Lab Results  Component Value Date   TRIG 164.0 (H) 04/17/2020   Lab Results  Component Value Date   CHOLHDL 4 04/17/2020   Lab Results  Component Value Date   HGBA1C 5.9 04/17/2018      Assessment & Plan:   Problem List Items Addressed This Visit       Endocrine   IFG (impaired fasting glucose)   Relevant Orders   Comprehensive metabolic panel   CBC   Hemoglobin A1c   Urinalysis, Routine w reflex microscopic     Musculoskeletal and Integument   Age-related osteoporosis without current pathological fracture     Other   Hyperlipidemia   Relevant Orders   Comprehensive metabolic panel   CBC   LDL cholesterol, direct   Memory disorder - Primary    No orders of the defined types were placed in this encounter.   Follow-up: Return in about 6 months (around 10/22/2021).    [name], MD

## 2021-04-23 NOTE — Addendum Note (Signed)
Addended by: Lynnea Ferrier on: 04/23/2021 02:58 PM   Modules accepted: Orders

## 2021-05-18 ENCOUNTER — Telehealth: Payer: Self-pay | Admitting: Family Medicine

## 2021-05-18 NOTE — Telephone Encounter (Signed)
Pt daughter called asking about the blood work from 10/27 appt... it shows it is not released.Marland Kitchen

## 2021-05-18 NOTE — Telephone Encounter (Signed)
Called patients daughter to inform of message below, no answer LMTCB. Patient needing to come back for labs or go to main lab to have labs drawn since no one at our office could get a good stick.

## 2021-05-18 NOTE — Telephone Encounter (Signed)
Patient daughter calling states that when patient was in the office for labs, blood and urine was collected. Only U/A results in chart. Can someone clarify if blood was sent for testing?

## 2021-05-20 NOTE — Telephone Encounter (Signed)
Returned call no answer tried both contact numbers was able to LM on one number to [facility]

## 2021-05-20 NOTE — Telephone Encounter (Signed)
Pt's daughter missed her call and would like a call back.

## 2021-05-20 NOTE — Telephone Encounter (Signed)
Tried calling patients daughter no answer unable to LM, will call back

## 2021-06-01 NOTE — Telephone Encounter (Signed)
Spoke with patients daughter who states that she will take patient to have blood work drawn

## 2021-06-04 ENCOUNTER — Other Ambulatory Visit (INDEPENDENT_AMBULATORY_CARE_PROVIDER_SITE_OTHER): Payer: Medicare Other

## 2021-06-04 DIAGNOSIS — E782 Mixed hyperlipidemia: Secondary | ICD-10-CM | POA: Diagnosis not present

## 2021-06-04 DIAGNOSIS — R7301 Impaired fasting glucose: Secondary | ICD-10-CM

## 2021-06-04 LAB — CBC
HCT: 38.5 % (ref 36.0–46.0)
Hemoglobin: 12.6 g/dL (ref 12.0–15.0)
MCHC: 32.8 g/dL (ref 30.0–36.0)
MCV: 88.9 fl (ref 78.0–100.0)
Platelets: 225 10*3/uL (ref 150.0–400.0)
RBC: 4.32 Mil/uL (ref 3.87–5.11)
RDW: 13.4 % (ref 11.5–15.5)
WBC: 7.2 10*3/uL (ref 4.0–10.5)

## 2021-06-04 LAB — COMPREHENSIVE METABOLIC PANEL
ALT: 12 U/L (ref 0–35)
AST: 22 U/L (ref 0–37)
Albumin: 4 g/dL (ref 3.5–5.2)
Alkaline Phosphatase: 89 U/L (ref 39–117)
BUN: 20 mg/dL (ref 6–23)
CO2: 31 mEq/L (ref 19–32)
Calcium: 9.7 mg/dL (ref 8.4–10.5)
Chloride: 99 mEq/L (ref 96–112)
Creatinine, Ser: 0.86 mg/dL (ref 0.40–1.20)
GFR: 61.23 mL/min (ref >60.00)
Glucose, Bld: 139 mg/dL — ABNORMAL HIGH (ref 70–99)
Potassium: 4 mEq/L (ref 3.5–5.1)
Sodium: 137 mEq/L (ref 135–145)
Total Bilirubin: 0.4 mg/dL (ref 0.2–1.2)
Total Protein: 7.2 g/dL (ref 6.0–8.3)

## 2021-06-04 LAB — HEMOGLOBIN A1C: Hgb A1c MFr Bld: 6.3 % (ref 4.6–6.5)

## 2021-06-04 LAB — LDL CHOLESTEROL, DIRECT: Direct LDL: 135 mg/dL

## 2021-06-10 ENCOUNTER — Other Ambulatory Visit: Payer: Self-pay | Admitting: Family Medicine

## 2021-06-10 ENCOUNTER — Other Ambulatory Visit: Payer: Self-pay | Admitting: Family

## 2021-07-13 ENCOUNTER — Telehealth: Payer: Self-pay | Admitting: Family Medicine

## 2021-07-13 NOTE — Telephone Encounter (Signed)
I attempted to leave message for patient to call back and schedule Medicare Annual Wellness Visit (AWV) in office. No voice mail.  If not able to come in office, please offer to do virtually or by telephone.  Left office number and my jabber [phone].  Last AWV:10/24/2019  Please schedule at anytime with Nurse Health Advisor.

## 2021-07-21 ENCOUNTER — Other Ambulatory Visit: Payer: Self-pay

## 2021-07-21 ENCOUNTER — Ambulatory Visit: Payer: Medicare Other | Admitting: Podiatry

## 2021-07-21 ENCOUNTER — Encounter: Payer: Self-pay | Admitting: Podiatry

## 2021-07-21 DIAGNOSIS — M79674 Pain in right toe(s): Secondary | ICD-10-CM

## 2021-07-21 DIAGNOSIS — B351 Tinea unguium: Secondary | ICD-10-CM

## 2021-07-21 DIAGNOSIS — M79675 Pain in left toe(s): Secondary | ICD-10-CM

## 2021-07-27 ENCOUNTER — Telehealth: Payer: Self-pay | Admitting: Family Medicine

## 2021-07-27 NOTE — Telephone Encounter (Signed)
Left message for patient to call back and schedule Medicare Annual Wellness Visit (AWV) in office.   If not able to come in office, please offer to do virtually or by telephone.  Left office number and my jabber [phone].  Last AWV:10/24/2019  Please schedule at anytime with Nurse Health Advisor.

## 2021-07-28 NOTE — Progress Notes (Signed)
°  Subjective:  Patient ID: Brenda Hammond, female    DOB: Jun 20, 1933,  MRN: [id number]  Brenda Hammond presents to clinic today for painful elongated mycotic toenails 1-5 bilaterally which are tender when wearing enclosed shoe gear. Pain is relieved with periodic professional debridement.  New problem(s): None.   Her daughter is present during today's visit.  PCP is [name], MD , and last visit was 04/23/2021.  Allergies  Allergen Reactions   Sulfa Antibiotics Anaphylaxis    REACTION: elevated HR and BP   Alendronate Other (See Comments)    "twitching" "twitching"   Ampicillin     REACTION: rash and high fever   Aspirin     REACTION: ears ringing   Cyclobenzaprine Hcl     REACTION: nightmares   Metaxalone     REACTION: nightmares   Nitrofurantoin     REACTION: elevated BP and chest pain   Nsaids     REACTION: rash   Penicillins     REACTION: rash, high fever   Sulfonamide Derivatives     REACTION: elevated HR and BP   Aricept [Donepezil] Rash    Dizziness and cramping   Nortriptyline Hcl Rash    Review of Systems: Negative except as noted in the HPI. Objective:   Constitutional Brenda Hammond is a pleasant 88 y.o. Caucasian female, WD, WN in NAD. AAO x 3.   Vascular CFT immediate b/l LE. Palpable DP/PT pulses b/l LE. Digital hair absent b/l. Skin temperature gradient WNL b/l. No pain with calf compression b/l. No edema noted b/l. No cyanosis or clubbing noted b/l LE.  Neurologic Normal speech. Oriented to person, place, and time. Protective sensation intact 5/5 intact bilaterally with 10g monofilament b/l. Vibratory sensation intact b/l.  Dermatologic Pedal skin thin and atrophic b/l LE. No open wounds b/l LE. No interdigital macerations noted b/l LE. Toenails 1-5 b/l elongated, discolored, dystrophic, thickened, crumbly with subungual debris and tenderness to dorsal palpation. No hyperkeratotic nor porokeratotic lesions present on  today's visit.  Orthopedic: Muscle strength 5/5 to all lower extremity muscle groups bilaterally. Hammertoe deformity noted 2-5 b/l.   Radiographs: None  Last A1c:  Hemoglobin A1C Latest Ref Rng & Units 06/04/2021  HGBA1C 4.6 - 6.5 % 6.3  Some recent data might be hidden   Assessment:   1. Pain due to onychomycosis of toenails of both feet    Plan:  Patient was evaluated and treated and all questions answered. Consent given for treatment as described below: -Mycotic toenails 1-5 bilaterally were debrided in length and girth with sterile nail nippers and dremel without incident. -Patient/POA to call should there be question/concern in the interim.  Return in about 3 months (around 10/19/2021).  Marzetta Board, DPM

## 2021-08-04 ENCOUNTER — Emergency Department (HOSPITAL_BASED_OUTPATIENT_CLINIC_OR_DEPARTMENT_OTHER): Payer: Medicare Other | Admitting: Radiology

## 2021-08-04 ENCOUNTER — Other Ambulatory Visit: Payer: Self-pay

## 2021-08-04 ENCOUNTER — Telehealth: Payer: Self-pay | Admitting: Family Medicine

## 2021-08-04 ENCOUNTER — Emergency Department (HOSPITAL_BASED_OUTPATIENT_CLINIC_OR_DEPARTMENT_OTHER)
Admission: EM | Admit: 2021-08-04 | Discharge: 2021-08-04 | Disposition: A | Discharge status: Home / Self Care | Payer: Medicare Other | Attending: Emergency Medicine | Admitting: Emergency Medicine

## 2021-08-04 ENCOUNTER — Encounter (HOSPITAL_BASED_OUTPATIENT_CLINIC_OR_DEPARTMENT_OTHER): Payer: Self-pay

## 2021-08-04 DIAGNOSIS — R059 Cough, unspecified: Secondary | ICD-10-CM | POA: Diagnosis not present

## 2021-08-04 DIAGNOSIS — H5789 Other specified disorders of eye and adnexa: Secondary | ICD-10-CM | POA: Insufficient documentation

## 2021-08-04 DIAGNOSIS — U071 COVID-19: Secondary | ICD-10-CM | POA: Insufficient documentation

## 2021-08-04 DIAGNOSIS — G309 Alzheimer's disease, unspecified: Secondary | ICD-10-CM | POA: Diagnosis not present

## 2021-08-04 DIAGNOSIS — R9431 Abnormal electrocardiogram [ECG] [EKG]: Secondary | ICD-10-CM | POA: Diagnosis not present

## 2021-08-04 DIAGNOSIS — R509 Fever, unspecified: Secondary | ICD-10-CM | POA: Diagnosis not present

## 2021-08-04 LAB — CBC WITH DIFFERENTIAL/PLATELET
Abs Immature Granulocytes: 0.01 10*3/uL (ref 0.00–0.07)
Basophils Absolute: 0 10*3/uL (ref 0.0–0.1)
Basophils Relative: 1 %
Eosinophils Absolute: 0 10*3/uL (ref 0.0–0.5)
Eosinophils Relative: 0 %
HCT: 44.3 % (ref 36.0–46.0)
Hemoglobin: 14.3 g/dL (ref 12.0–15.0)
Immature Granulocytes: 0 %
Lymphocytes Relative: 7 %
Lymphs Abs: 0.4 10*3/uL — ABNORMAL LOW (ref 0.7–4.0)
MCH: 28.8 pg (ref 26.0–34.0)
MCHC: 32.3 g/dL (ref 30.0–36.0)
MCV: 89.1 fL (ref 80.0–100.0)
Monocytes Absolute: 0.6 10*3/uL (ref 0.1–1.0)
Monocytes Relative: 9 %
Neutro Abs: 5.1 10*3/uL (ref 1.7–7.7)
Neutrophils Relative %: 83 %
Platelets: 171 10*3/uL (ref 150–400)
RBC: 4.97 MIL/uL (ref 3.87–5.11)
RDW: 13.1 % (ref 11.5–15.5)
WBC: 6.1 10*3/uL (ref 4.0–10.5)
nRBC: 0 % (ref 0.0–0.2)

## 2021-08-04 LAB — COMPREHENSIVE METABOLIC PANEL
ALT: 10 U/L (ref 0–44)
AST: 22 U/L (ref 15–41)
Albumin: 3.9 g/dL (ref 3.5–5.0)
Alkaline Phosphatase: 75 U/L (ref 38–126)
Anion gap: 7 (ref 5–15)
BUN: 16 mg/dL (ref 8–23)
CO2: 28 mmol/L (ref 22–32)
Calcium: 9 mg/dL (ref 8.9–10.3)
Chloride: 101 mmol/L (ref 98–111)
Creatinine, Ser: 0.91 mg/dL (ref 0.44–1.00)
GFR, Estimated: >60 mL/min (ref >60)
Glucose, Bld: 121 mg/dL — ABNORMAL HIGH (ref 70–99)
Potassium: 4 mmol/L (ref 3.5–5.1)
Sodium: 136 mmol/L (ref 135–145)
Total Bilirubin: 0.5 mg/dL (ref 0.3–1.2)
Total Protein: 7 g/dL (ref 6.5–8.1)

## 2021-08-04 LAB — URINALYSIS, ROUTINE W REFLEX MICROSCOPIC
Bilirubin Urine: NEGATIVE
Glucose, UA: NEGATIVE mg/dL
Hgb urine dipstick: NEGATIVE
Ketones, ur: 15 mg/dL — AB
Nitrite: NEGATIVE
Protein, ur: 30 mg/dL — AB
Specific Gravity, Urine: 1.032 — ABNORMAL HIGH (ref 1.005–1.030)
pH: 6 (ref 5.0–8.0)

## 2021-08-04 LAB — RESP PANEL BY RT-PCR (FLU A&B, COVID) ARPGX2
Influenza A by PCR: NEGATIVE
Influenza B by PCR: NEGATIVE
SARS Coronavirus 2 by RT PCR: POSITIVE — AB

## 2021-08-04 LAB — LACTIC ACID, PLASMA: Lactic Acid, Venous: 1 mmol/L (ref 0.5–1.9)

## 2021-08-04 MED ORDER — LACTATED RINGERS IV BOLUS
500.0000 mL | Freq: Once | INTRAVENOUS | Status: AC
  Administered 2021-08-04: 500 mL via INTRAVENOUS

## 2021-08-04 MED ORDER — MOLNUPIRAVIR EUA 200MG CAPSULE: 4.0000 | ORAL_CAPSULE | Freq: Two times a day (BID) | ORAL | 0 refills | Status: AC

## 2021-08-04 MED ORDER — ACETAMINOPHEN 325 MG PO TABS
650.0000 mg | ORAL_TABLET | Freq: Once | ORAL | Status: AC
  Administered 2021-08-04: 650 mg via ORAL
  Filled 2021-08-04: qty 2

## 2021-08-04 NOTE — ED Provider Notes (Signed)
[facility] EMERGENCY DEPT Provider Note   CSN: [id number] Arrival date & time: 08/04/21  1304     History  Chief Complaint  Patient presents with   Fever    Brenda Hammond is a 88 y.o. female who presents with her daughter at the bedside with concern for fever, fatigue, decreased appetite, runny nose, and watery eyes since yesterday.  Patient's husband is currently admitted to [facility] long hospital with COVID-19 pneumonia.  They do live together independently.  Tmax at home 102 F treated with fever.  Normal urine output today no decreased appetite.  Patient is ambulatory with a cane at baseline according to her daughter has been able to ambulate today though she does appear significantly fatigued when walking long distances.  Brenda Hammond has history of Alzheimer's dementia, atrial fibrillation, colon adenocarcinoma, hyperlipidemia, and venous insufficiency.  She is on alendronate once a week but otherwise is not on any medications daily.  HPI     Home Medications Prior to Admission medications   Medication Sig Start Date End Date Taking? Authorizing Provider  Acetaminophen (TYLENOL ARTHRITIS EXT RELIEF PO) Take 650 mg by mouth 2 (two) times daily as needed (pain).    [provider]  alendronate (FOSAMAX) 70 MG tablet TAKE 1 TABLET(70 MG) BY MOUTH EVERY 7 DAYS WITH A FULL GLASS OF WATER AND ON AN EMPTY STOMACH 06/11/21   [name], MD  Ascorbic Acid (VITAMIN C) 1000 MG tablet Take 1,000 mg by mouth daily. Patient not taking: Reported on 04/23/2021    [provider]  bisacodyl (LAXATIVE) 5 MG EC tablet Take 5 mg by mouth daily as needed for moderate constipation.    [provider]  Calcium Carbonate (CALCIUM 600 PO) Take 1 tablet by mouth daily. Patient not taking: Reported on 04/23/2021    [provider]  calcium carbonate (TUMS - DOSED IN MG ELEMENTAL CALCIUM) 500 MG chewable tablet Chew 1 tablet by mouth daily  as needed for indigestion or heartburn. Patient not taking: Reported on 04/23/2021    [provider]  cholecalciferol (VITAMIN D) 1000 units tablet Take 1,000 Units by mouth daily. Patient not taking: Reported on 04/23/2021    [provider]  loratadine (CLARITIN) 10 MG tablet Take 10 mg by mouth as needed for allergies or rhinitis.    [provider]  Menthol (HALLS COUGH DROPS) 5.8 MG LOZG Use as directed 1 lozenge in the mouth or throat daily as needed (cough).    [provider]  Multiple Vitamins-Minerals (MULTIVITAMIN WITH MINERALS) tablet Take 1 tablet by mouth daily. Patient not taking: Reported on 04/23/2021    [provider]  zinc sulfate 220 (50 Zn) MG capsule Take 220 mg by mouth daily. Patient not taking: Reported on 04/23/2021    [provider]      Allergies    Sulfa antibiotics, Alendronate, Ampicillin, Aspirin, Cyclobenzaprine hcl, Metaxalone, Nitrofurantoin, Nsaids, Penicillins, Sulfonamide derivatives, Aricept [donepezil], and Nortriptyline hcl    Review of Systems   Review of Systems  Constitutional:  Positive for activity change, appetite change, chills, fatigue and fever.  HENT:  Positive for congestion, rhinorrhea and sneezing. Negative for trouble swallowing.   Eyes:  Positive for discharge.  Respiratory:  Positive for cough. Negative for shortness of breath.   Cardiovascular: Negative.   Gastrointestinal: Negative.   Genitourinary:  Negative for decreased urine volume.  Neurological:  Positive for weakness. Negative for dizziness, light-headedness, numbness and headaches.   Physical Exam Updated Vital  Signs BP (!) 126/50    Pulse 75    Temp (!) 103.4 F (39.7 C)    Resp (!) 23    Ht 5\' 1"  (1.549 m)    Wt 54.4 kg    SpO2 97%    BMI 22.67 kg/m  Physical Exam Vitals and nursing note reviewed.  Constitutional:      General: She is sleeping. She is not in acute distress.    Appearance: She is  ill-appearing. She is not toxic-appearing.  HENT:     Head: Normocephalic and atraumatic.     Nose: Congestion and rhinorrhea present. Rhinorrhea is clear.     Mouth/Throat:     Mouth: Mucous membranes are moist.     Pharynx: Oropharynx is clear. Uvula midline. No oropharyngeal exudate or posterior oropharyngeal erythema.     Tonsils: No tonsillar exudate.  Eyes:     General: Lids are normal. Vision grossly intact.        Right eye: Discharge present.        Left eye: No discharge.     Extraocular Movements: Extraocular movements intact.     Conjunctiva/sclera: Conjunctivae normal.     Pupils: Pupils are equal, round, and reactive to light.     Comments: Watery discharge from the right eye.  No conjunctival injection.  Neck:     Trachea: Trachea and phonation normal.     Meningeal: Brudzinski's sign absent.  Cardiovascular:     Rate and Rhythm: Normal rate and regular rhythm.     Pulses: Normal pulses.     Heart sounds: Normal heart sounds. No murmur heard. Pulmonary:     Effort: Pulmonary effort is normal. No tachypnea, bradypnea, accessory muscle usage, prolonged expiration, respiratory distress or retractions.     Breath sounds: No decreased air movement. Examination of the left-lower field reveals rhonchi. Rhonchi present. No wheezing or rales.  Chest:     Chest wall: No mass, lacerations, deformity, swelling, tenderness, crepitus or edema.  Abdominal:     General: Bowel sounds are normal. There is no distension.     Palpations: Abdomen is soft.     Tenderness: There is no abdominal tenderness. There is no right CVA tenderness, left CVA tenderness, guarding or rebound.  Musculoskeletal:        General: No deformity.     Cervical back: Normal range of motion and neck supple. No edema, rigidity, tenderness or crepitus. No pain with movement, spinous process tenderness or muscular tenderness.     Right lower leg: No edema.     Left lower leg: No edema.  Lymphadenopathy:      Cervical: No cervical adenopathy.  Skin:    General: Skin is warm and dry.     Capillary Refill: Capillary refill takes less than 2 seconds.     Findings: No rash.  Neurological:     General: No focal deficit present.     Mental Status: She is oriented to person, place, and time and easily aroused. Mental status is at baseline.     GCS: GCS eye subscore is 4. GCS verbal subscore is 5. GCS motor subscore is 6.     Gait: Gait is intact.  Psychiatric:        Mood and Affect: Mood normal.    ED Results / Procedures / Treatments   Labs (all labs ordered are listed, but only abnormal results are displayed) Labs Reviewed  RESP PANEL BY RT-PCR (FLU A&B, COVID) [id number]  CULTURE, BLOOD (ROUTINE X  2)  CULTURE, BLOOD (ROUTINE X 2)  COMPREHENSIVE METABOLIC PANEL  LACTIC ACID, PLASMA  LACTIC ACID, PLASMA  CBC WITH DIFFERENTIAL/PLATELET  PROTIME-INR  URINALYSIS, ROUTINE W REFLEX MICROSCOPIC    EKG None  Radiology DG Chest Port 1 View  Result Date: 08/04/2021 CLINICAL DATA:  Provided history: Cough with fever. Additional history provided: Fever and cough which began last night, exposure to COVID. EXAM: PORTABLE CHEST 1 VIEW COMPARISON:  Radiograph 12/11/2020 and earlier. FINDINGS: Heart size within normal limits. Aortic atherosclerosis. Mild ill-defined opacity within the left lung base. Redemonstrated left upper lobe pulmonary scarring. No appreciable airspace consolidation within the right lung. No evidence of pleural effusion or pneumothorax. No acute bony abnormality identified. Thoracic levocurvature. IMPRESSION: Mild ill-defined opacity within the left lung base, which may reflect atelectasis or early pneumonia. Redemonstrated left upper lobe pulmonary scarring. Aortic Atherosclerosis (ICD10-I70.0). Thoracic levocurvature. Electronically Signed   By: Kellie Simmering D.O.   On: 08/04/2021 13:54    Procedures Procedures    Medications Ordered in ED Medications - No data to display  ED  Course/ Medical Decision Making/ A&P                           Medical Decision Making 88 year old female presentConcern for fever, decreased appetite, and URI symptoms for the last 24 hours.  Has been with COVID-19.  Febrile to 103 F on intake.  Mildly tachypneic but vital signs are otherwise normal.  Cardiopulmonary exam revealed some rhonchi in the left lung base.  She is no longer tachypneic.  Abdominal exam is benign.  Patient with clear rhinorrhea but otherwise reassuring HEENT exam with moist mucous membranes.  No tenting of the skin or decreased skin turgor.  No lower extremity edema.   Amount and/or Complexity of Data Reviewed Labs: ordered.    Details: CBC unremarkable, CMP unremarkable.  RVP positive for COVID-19.  Lactic acid is normal.  UA without sign of infection Radiology: ordered.    Details: Chest x-ray with ill-defined opacity in the left lung base reflecting possible early pneumonia. ECG/medicine tests:     Details: No STEMI on EKG.  Risk OTC drugs.  We will reevaluate after administration of Tylenol and fluid bolus.  If patient can ambulate independently and is able to provide urine specimen will allow her to be discharged home given lack of hypoxia and overall very reassuring physical exam.  Patient feeling much improved after fluid bolus and management of her fever with Tylenol.  Tolerating p.o., ambulatory in the emergency department, and cognitively intact.  Option saturation remained greater than 95% on room air throughout her stay and she is no longer tachypneic after resolution of her fever.  Overall clinical picture most consistent with COVID-19 infection.  Clinical suspicion for emergent underlying issue that would warrant further work-up in the emergency department or inpatient management is very low.  We will discharge with prescription for antiviral medication given patient's advanced age.  Ryenn and her daughter voiced understanding for medical evaluation  and treatment plan.  If her questions answered to her expressed satisfaction.  Return precautions were given.  Patient is well-appearing, stable, and was discharged in good condition.  This chart was dictated using voice recognition software, Dragon. Despite the best efforts of this provider to proofread and correct errors, errors may still occur which can change documentation meaning.  Shuntavia Yerby was evaluated in Emergency Department on 08/04/2021 for the symptoms described in the history of present  illness. She was evaluated in the context of the global COVID-19 pandemic, which necessitated consideration that the patient might be at risk for infection with the SARS-CoV-2 virus that causes COVID-19. Institutional protocols and algorithms that pertain to the evaluation of patients at risk for COVID-19 are in a state of rapid change based on information released by regulatory bodies including the CDC and federal and state organizations. These policies and algorithms were followed during the patient's care in the ED.   Final Clinical Impression(s) / ED Diagnoses Final diagnoses:  Cough with fever    Rx / DC Orders ED Discharge Orders     None         Emeline Darling, PA-C 08/04/21 1739    Sherwood Gambler, MD 08/06/21 [phone]

## 2021-08-04 NOTE — ED Notes (Signed)
Bladder scan amount 70mL

## 2021-08-04 NOTE — Discharge Instructions (Signed)
Brenda Hammond is seen in the ER today for her fever and decreased appetite.  She tested positive for COVID-19.  Has been prescribed an antiviral medicine called molnupiravir which she may take for the next 5 days to prevent development of complications from a [id number] infection.  He may use Tylenol as needed for fever.  Please encourage her to remain hydrated and follow-up with her primary care doctor.  Return to ER if you develop some difficulty breathing, nausea or vomiting that does not stop, or any other new severe symptoms

## 2021-08-04 NOTE — Telephone Encounter (Addendum)
We received through fax pts daughters FMLA form, I attached charge form and put in Dr Avel Peace folder up front. Charge form still needs to be signed

## 2021-08-04 NOTE — ED Triage Notes (Signed)
C/O fever started last night, + exposure to covid.   Daughter reported decrease appetite, and been sleeping, cough, runny nose and red watery eyes.

## 2021-08-04 NOTE — ED Notes (Signed)
Patient assisted to the bedside commode and no desat or desaturation noted. Patient walked around the room without complication and O2 sat acceptable @ 95-97%. RN aware.

## 2021-08-09 LAB — CULTURE, BLOOD (ROUTINE X 2)
Culture: NO GROWTH
Culture: NO GROWTH
Special Requests: ADEQUATE
Special Requests: ADEQUATE

## 2021-08-10 NOTE — Telephone Encounter (Signed)
Pts daughter called and just asked for a call back at [phone] when the form is faxed, please advise

## 2021-08-12 NOTE — Telephone Encounter (Signed)
Forms received waiting to be viewed and signed by provider.

## 2021-08-15 IMAGING — DX DG CHEST 1V PORT
1 series · 1 of 1 positions shown · non-contrast
Comparison: 11/26/2015

CLINICAL DATA: COVID positive

EXAM:
PORTABLE CHEST 1 VIEW

[chest ap]
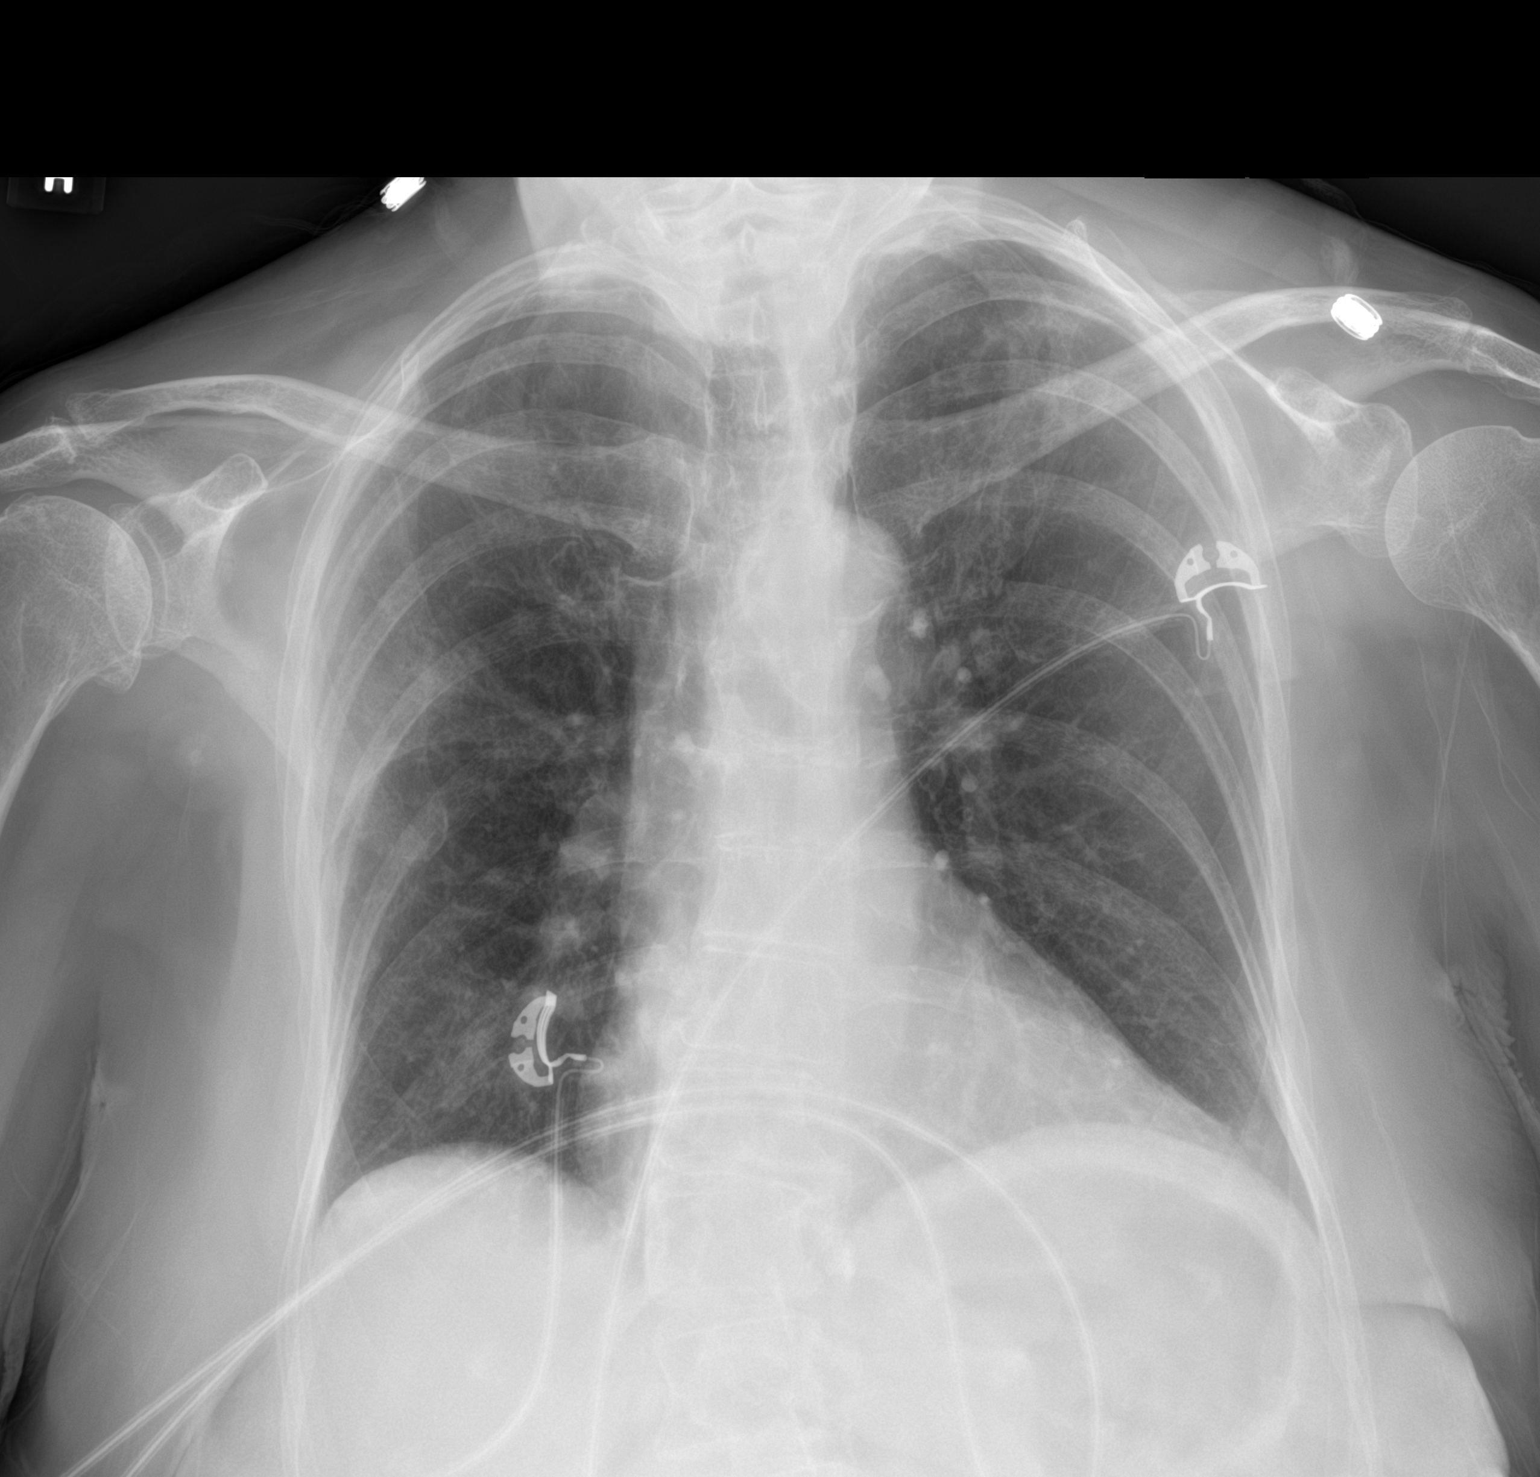

[1 of 1 positions shown; findings below may reference images not displayed]

FINDINGS: Normal cardiac silhouette. Lungs are mildly hyperinflated. There is
chronic bronchitic markings centrally. Scarring in the upper lobes
is noted. No clear airspace disease present. No pneumothorax. No
acute osseous abnormality.
IMPRESSION: No pneumonia identified.  Chronic findings as above.

## 2021-08-26 NOTE — Telephone Encounter (Signed)
Patients daughter aware that requested form faxed over.  ?

## 2021-09-03 ENCOUNTER — Other Ambulatory Visit: Payer: Self-pay | Admitting: Family Medicine

## 2021-10-13 ENCOUNTER — Encounter: Payer: Self-pay | Admitting: Family Medicine

## 2021-10-13 ENCOUNTER — Ambulatory Visit (INDEPENDENT_AMBULATORY_CARE_PROVIDER_SITE_OTHER): Payer: Medicare Other | Admitting: Family Medicine

## 2021-10-13 VITALS — BP 134/68 | HR 74 | Temp 97.7°F | Ht 61.0 in | Wt 118.2 lb

## 2021-10-13 DIAGNOSIS — Z0000 Encounter for general adult medical examination without abnormal findings: Secondary | ICD-10-CM

## 2021-10-13 DIAGNOSIS — R2681 Unsteadiness on feet: Secondary | ICD-10-CM

## 2021-10-13 DIAGNOSIS — R413 Other amnesia: Secondary | ICD-10-CM | POA: Diagnosis not present

## 2021-10-13 NOTE — Progress Notes (Signed)
? ?  Established Patient Office Visit ? ?Subjective   ?Patient ID: Brenda Hammond, female    DOB: 05/01/1933  Age: 88 y.o. MRN: [id number] ? ?Chief Complaint  ?Patient presents with  ? Follow-up  ?  6 month follow up, no concerns needing paper work filled out for nursing home.   ? ? ?HPI for FL 2 form.  Significant history of memory disturbance and gait instability.  She is living at home with her husband.  She is accompanied by her daughter daughter feels as though the cannot be left alone.  Patient does dress herself but put some on clothes backwards.  She toilets, feeds herself and ambulates with a cane. ? ? ? ?Review of Systems  ?Constitutional:  Negative for chills, diaphoresis, malaise/fatigue and weight loss.  ?HENT: Negative.    ?Eyes: Negative.  Negative for blurred vision and double vision.  ?Respiratory:  Negative for shortness of breath.   ?Cardiovascular:  Negative for chest pain.  ?Gastrointestinal:  Negative for abdominal pain.  ?Genitourinary: Negative.   ?Musculoskeletal:  Negative for falls and myalgias.  ?Neurological:  Negative for speech change, loss of consciousness and weakness.  ?Psychiatric/Behavioral:  Positive for memory loss.   ? ?  ?Objective:  ?  ? ?BP 134/68 (BP Location: Left Arm, Patient Position: Sitting, Cuff Size: Normal)   Pulse 74   Temp 97.7 ?F (36.5 ?C) (Temporal)   Ht '5\' 1"'$  (1.549 m)   Wt 118 lb 3.2 oz (53.6 kg)   SpO2 98%   BMI 22.33 kg/m?  ? ? ?Physical Exam ?Vitals and nursing note reviewed.  ?Constitutional:   ?   General: She is not in acute distress. ?   Appearance: Normal appearance. She is not ill-appearing, toxic-appearing or diaphoretic.  ?HENT:  ?   Head: Normocephalic and atraumatic.  ?   Right Ear: External ear normal.  ?   Left Ear: External ear normal.  ?Eyes:  ?   General: No scleral icterus.    ?   Right eye: No discharge.     ?   Left eye: No discharge.  ?   Extraocular Movements: Extraocular movements intact.  ?   Conjunctiva/sclera:  Conjunctivae normal.  ?Pulmonary:  ?   Effort: Pulmonary effort is normal.  ?Skin: ?   General: Skin is warm and dry.  ?Neurological:  ?   Mental Status: She is alert. Mental status is at baseline.  ?Psychiatric:     ?   Mood and Affect: Mood normal.     ?   Behavior: Behavior normal.  ? ? ? ?No results found for any visits on 10/13/21. ? ? ? ?The ASCVD Risk score (Arnett DK, et al., 2019) failed to calculate for the following reasons: ?  The 2019 ASCVD risk score is only valid for ages 33 to 62 ? ?  ?Assessment & Plan:  ? ?Problem List Items Addressed This Visit   ? ?  ? Other  ? Memory disorder - Primary  ? ?Other Visit Diagnoses   ? ? Gait instability      ? Healthcare maintenance      ? Relevant Orders  ? QuantiFERON-TB Gold Plus  ? ?  ? ? ?Return in about 3 months (around 01/12/2022).  ?FL 2 form was completed.  Completed DNR forms for patient. ? ?[name], MD ? ?

## 2021-10-16 LAB — QUANTIFERON-TB GOLD PLUS
Mitogen-NIL: >10 IU/mL
NIL: 0.03 IU/mL
QuantiFERON-TB Gold Plus: NEGATIVE
TB1-NIL: 0.07 IU/mL
TB2-NIL: 0.06 IU/mL

## 2021-10-19 ENCOUNTER — Telehealth: Payer: Self-pay | Admitting: Family Medicine

## 2021-10-19 NOTE — Telephone Encounter (Signed)
Brenda Hammond

## 2021-10-19 NOTE — Telephone Encounter (Signed)
Melody(daughter) is calling in checking on status of pt's FL2 form. The FL2 form needs to be corrected and her tb reading also needs to be faxed over to [facility].  ?

## 2021-10-19 NOTE — Telephone Encounter (Signed)
Done

## 2021-10-21 ENCOUNTER — Ambulatory Visit: Payer: Medicare Other | Admitting: Podiatry

## 2021-10-21 ENCOUNTER — Encounter: Payer: Self-pay | Admitting: Podiatry

## 2021-10-21 DIAGNOSIS — M79675 Pain in left toe(s): Secondary | ICD-10-CM

## 2021-10-21 DIAGNOSIS — B351 Tinea unguium: Secondary | ICD-10-CM | POA: Diagnosis not present

## 2021-10-21 DIAGNOSIS — M79674 Pain in right toe(s): Secondary | ICD-10-CM

## 2021-10-26 ENCOUNTER — Telehealth: Payer: Self-pay | Admitting: Family Medicine

## 2021-10-26 ENCOUNTER — Ambulatory Visit: Payer: Medicare Other | Admitting: Family Medicine

## 2021-10-26 NOTE — Telephone Encounter (Signed)
Brenda Hammond [phone] from her nursing home called questioning a med pt has on her list that she is allergic to and wants to know about her taking it. I could not find the drug she was talking about. I could not find medication allergy. ?

## 2021-10-28 NOTE — Telephone Encounter (Signed)
Returned Cathy's call, no answer LMTCB. Called patients daughter to see if she was aware of the medication and allergy per patients daughter there is no allergy to Alendronate patient takes every night before bed. Informed [name] (patients daughter) that records show twitching usually occur when taking. Per daughter this have never happened but she would like for this medication to be discontinued. Please advise, okay to inform facility to D/C?  ?

## 2021-10-30 ENCOUNTER — Telehealth: Payer: Self-pay | Admitting: Family Medicine

## 2021-10-30 NOTE — Telephone Encounter (Signed)
Called Hanna regarding patients medication, no answer LMTCB ?

## 2021-10-30 NOTE — Telephone Encounter (Signed)
Pt is needing her TB results faxed to [facility]   fax#[phone]. Attention  Margarita Grizzle ?

## 2021-10-30 NOTE — Telephone Encounter (Signed)
Results faxed for patient and patients husband  ?

## 2021-10-30 NOTE — Progress Notes (Signed)
?  Subjective:  ?Patient ID: Brenda Hammond, female    DOB: 31-Jul-1932,  MRN: [id number] ? ?Brenda Hammond presents to clinic today for painful elongated mycotic toenails 1-5 bilaterally which are tender when wearing enclosed shoe gear. Pain is relieved with periodic professional debridement. ? ?Patient is accompanied by her daughter on today's visit. Daughter states Mom and Dad will be moving into assisted living facility next week. ? ?New problem(s): None.  ? ?PCP is [name], MD , and last visit was October 13, 2021. ? ?Allergies  ?Allergen Reactions  ? Sulfa Antibiotics Anaphylaxis  ?  REACTION: elevated HR and BP  ? Alendronate Other (See Comments)  ?  "twitching" ?"twitching"  ? Ampicillin   ?  REACTION: rash and high fever  ? Aspirin   ?  REACTION: ears ringing  ? Cyclobenzaprine Hcl   ?  REACTION: nightmares  ? Metaxalone   ?  REACTION: nightmares  ? Nitrofurantoin   ?  REACTION: elevated BP and chest pain  ? Nsaids   ?  REACTION: rash  ? Penicillins   ?  REACTION: rash, high fever  ? Sulfonamide Derivatives   ?  REACTION: elevated HR and BP  ? Aricept [Donepezil] Rash  ?  Dizziness and cramping  ? Nortriptyline Hcl Rash  ? ? ?Review of Systems: Negative except as noted in the HPI. ? ?Objective: No changes noted in today's physical examination. ? ?Constitutional Brenda Hammond is a pleasant 88 y.o. Caucasian female, WD, WN in NAD. AAO x 3.   ?Vascular CFT immediate b/l LE. Palpable DP/PT pulses b/l LE. Digital hair absent b/l. Skin temperature gradient WNL b/l. No pain with calf compression b/l. No edema noted b/l. No cyanosis or clubbing noted b/l LE.  ?Neurologic Normal speech. Oriented to person, place, and time. Protective sensation intact 5/5 intact bilaterally with 10g monofilament b/l. Vibratory sensation intact b/l.  ?Dermatologic Pedal skin thin and atrophic b/l LE. No open wounds b/l LE. No interdigital macerations noted b/l LE. Toenails 1-5 b/l elongated,  discolored, dystrophic, thickened, crumbly with subungual debris and tenderness to dorsal palpation. Minimal hyperkeratosis noted plantar aspect submet head 2, 3 right foot.   ?Orthopedic: Muscle strength 5/5 to all lower extremity muscle groups bilaterally. Hammertoe deformity noted 2-5 b/l.  ? ?Radiographs: None ? ?  Latest Ref Rng & Units 06/04/2021  ?  1:32 PM  ?Hemoglobin A1C  ?Hemoglobin-A1c 4.6 - 6.5 % 6.3    ? ?Assessment/Plan: ?1. Pain due to onychomycosis of toenails of both feet   ?  ?-Patient was evaluated and treated. All patient's and/or POA's questions/concerns answered on today's visit. ?-No paring of minimal hyperkeratosis required on today's visit. ?-Toenails 2-5 bilaterally and right great toe debrided in length and girth without iatrogenic bleeding with sterile nail nipper and dremel.  ?-Offending nail border debrided and curretaged left great toe utilizing sterile nail nipper and currette. Border cleansed with alcohol and triple antibiotic applied. No further treatment required by patient/caregiver. ?-Patient/POA to call should there be question/concern in the interim.  ? ?Return in about 3 months (around 01/20/2022). ? ?Marzetta Board, DPM  ?

## 2021-11-02 DIAGNOSIS — G309 Alzheimer's disease, unspecified: Secondary | ICD-10-CM | POA: Diagnosis not present

## 2021-11-02 DIAGNOSIS — M1991 Primary osteoarthritis, unspecified site: Secondary | ICD-10-CM | POA: Diagnosis not present

## 2021-11-02 DIAGNOSIS — J309 Allergic rhinitis, unspecified: Secondary | ICD-10-CM | POA: Diagnosis not present

## 2021-11-02 DIAGNOSIS — K5901 Slow transit constipation: Secondary | ICD-10-CM | POA: Diagnosis not present

## 2021-11-06 ENCOUNTER — Telehealth: Payer: Self-pay | Admitting: Family Medicine

## 2021-11-06 NOTE — Telephone Encounter (Signed)
Pts daughter called, the FMLA paper work needs editing. It says shes out for 6 hours of work, but it is 12. Please mark out the 6 and write in 12.  ? ? ?

## 2021-11-11 DIAGNOSIS — Z2821 Immunization not carried out because of patient refusal: Secondary | ICD-10-CM | POA: Diagnosis not present

## 2021-11-11 DIAGNOSIS — Z28310 Unvaccinated for covid-19: Secondary | ICD-10-CM | POA: Diagnosis not present

## 2021-11-11 DIAGNOSIS — M810 Age-related osteoporosis without current pathological fracture: Secondary | ICD-10-CM | POA: Diagnosis not present

## 2021-11-11 DIAGNOSIS — R7302 Impaired glucose tolerance (oral): Secondary | ICD-10-CM | POA: Diagnosis not present

## 2021-11-11 DIAGNOSIS — M150 Primary generalized (osteo)arthritis: Secondary | ICD-10-CM | POA: Diagnosis not present

## 2021-11-16 DIAGNOSIS — M810 Age-related osteoporosis without current pathological fracture: Secondary | ICD-10-CM | POA: Diagnosis not present

## 2021-11-16 DIAGNOSIS — M150 Primary generalized (osteo)arthritis: Secondary | ICD-10-CM | POA: Diagnosis not present

## 2021-11-16 DIAGNOSIS — Z2821 Immunization not carried out because of patient refusal: Secondary | ICD-10-CM | POA: Diagnosis not present

## 2021-11-16 DIAGNOSIS — R7302 Impaired glucose tolerance (oral): Secondary | ICD-10-CM | POA: Diagnosis not present

## 2021-11-16 DIAGNOSIS — Z28310 Unvaccinated for covid-19: Secondary | ICD-10-CM | POA: Diagnosis not present

## 2021-11-17 NOTE — Telephone Encounter (Signed)
Have called several times left messages to call back regarding FMLA forms no return call until 11/17/21. Delayed/ no documentation on each phone call with messages being left but had called to see if patient needed to fax over new forms to be filled out due to unsure if [employer] would except a mark out on FMLA forms. Have marked out 6 per patients request and faxed back. Also called patient left message to call back and let us know that message was received.

## 2021-11-17 NOTE — Telephone Encounter (Signed)
Patients daughter returned call spoke with front desk states that she would call to make sure [employer] received forms today.

## 2021-11-17 NOTE — Telephone Encounter (Signed)
Pt's daughter calling in again. She said no one contacted her back or updated the [employer] paperwork for her since 5/12. She is an employee at [employer] and getting occurrences being out to take care of her mom.   Paperwork notes 6 hrs per occurrence but it needs to be 12 hrs per occurrence. [employer] said ok to mark thru the original documents, change and initial, and refax to [employer].   Call back # [phone] Caller: [name], pts daughter (her FMLA)

## 2021-11-18 DIAGNOSIS — Z2821 Immunization not carried out because of patient refusal: Secondary | ICD-10-CM | POA: Diagnosis not present

## 2021-11-18 DIAGNOSIS — M810 Age-related osteoporosis without current pathological fracture: Secondary | ICD-10-CM | POA: Diagnosis not present

## 2021-11-18 DIAGNOSIS — M150 Primary generalized (osteo)arthritis: Secondary | ICD-10-CM | POA: Diagnosis not present

## 2021-11-18 DIAGNOSIS — R7302 Impaired glucose tolerance (oral): Secondary | ICD-10-CM | POA: Diagnosis not present

## 2021-11-18 DIAGNOSIS — Z28310 Unvaccinated for covid-19: Secondary | ICD-10-CM | POA: Diagnosis not present

## 2021-11-19 NOTE — Telephone Encounter (Signed)
Pt daughter called stating the papers were not filled out correctly, nothing was changed on them like requested.   Please mark out 6 hours and write in 12 hours, initial and date.  Please resign signature and date.  And fax the entire thing to [employer].

## 2021-11-19 NOTE — Telephone Encounter (Signed)
Forms signed and updated, patients daughter aware

## 2021-11-25 NOTE — Telephone Encounter (Signed)
Spoke with patients daughter who states that [employer] informed her that they have not received forms. Have sent over forms with conformations on 11/19/21, 11/17/21, and 08/26/21. Will send again today.

## 2021-11-25 NOTE — Telephone Encounter (Signed)
Pts daughter is askinig if the updated FMLA form was faxed. They calling her saying its incomplete. I saw where you noted it had been corrected.   Thank you

## 2021-11-26 DIAGNOSIS — M150 Primary generalized (osteo)arthritis: Secondary | ICD-10-CM | POA: Diagnosis not present

## 2021-11-26 DIAGNOSIS — M810 Age-related osteoporosis without current pathological fracture: Secondary | ICD-10-CM | POA: Diagnosis not present

## 2021-11-26 DIAGNOSIS — Z2821 Immunization not carried out because of patient refusal: Secondary | ICD-10-CM | POA: Diagnosis not present

## 2021-11-26 DIAGNOSIS — R7302 Impaired glucose tolerance (oral): Secondary | ICD-10-CM | POA: Diagnosis not present

## 2021-11-26 DIAGNOSIS — Z28310 Unvaccinated for covid-19: Secondary | ICD-10-CM | POA: Diagnosis not present

## 2021-11-30 DIAGNOSIS — G309 Alzheimer's disease, unspecified: Secondary | ICD-10-CM | POA: Diagnosis not present

## 2021-11-30 DIAGNOSIS — M1991 Primary osteoarthritis, unspecified site: Secondary | ICD-10-CM | POA: Diagnosis not present

## 2021-11-30 DIAGNOSIS — J309 Allergic rhinitis, unspecified: Secondary | ICD-10-CM | POA: Diagnosis not present

## 2021-11-30 DIAGNOSIS — K5901 Slow transit constipation: Secondary | ICD-10-CM | POA: Diagnosis not present

## 2021-12-02 DIAGNOSIS — M810 Age-related osteoporosis without current pathological fracture: Secondary | ICD-10-CM | POA: Diagnosis not present

## 2021-12-02 DIAGNOSIS — Z2821 Immunization not carried out because of patient refusal: Secondary | ICD-10-CM | POA: Diagnosis not present

## 2021-12-02 DIAGNOSIS — R7302 Impaired glucose tolerance (oral): Secondary | ICD-10-CM | POA: Diagnosis not present

## 2021-12-02 DIAGNOSIS — Z28310 Unvaccinated for covid-19: Secondary | ICD-10-CM | POA: Diagnosis not present

## 2021-12-02 DIAGNOSIS — M150 Primary generalized (osteo)arthritis: Secondary | ICD-10-CM | POA: Diagnosis not present

## 2021-12-07 DIAGNOSIS — R7302 Impaired glucose tolerance (oral): Secondary | ICD-10-CM | POA: Diagnosis not present

## 2021-12-07 DIAGNOSIS — M150 Primary generalized (osteo)arthritis: Secondary | ICD-10-CM | POA: Diagnosis not present

## 2021-12-07 DIAGNOSIS — Z28310 Unvaccinated for covid-19: Secondary | ICD-10-CM | POA: Diagnosis not present

## 2021-12-07 DIAGNOSIS — M810 Age-related osteoporosis without current pathological fracture: Secondary | ICD-10-CM | POA: Diagnosis not present

## 2021-12-07 DIAGNOSIS — Z2821 Immunization not carried out because of patient refusal: Secondary | ICD-10-CM | POA: Diagnosis not present

## 2021-12-14 DIAGNOSIS — M810 Age-related osteoporosis without current pathological fracture: Secondary | ICD-10-CM | POA: Diagnosis not present

## 2021-12-14 DIAGNOSIS — M150 Primary generalized (osteo)arthritis: Secondary | ICD-10-CM | POA: Diagnosis not present

## 2022-01-04 DIAGNOSIS — K5901 Slow transit constipation: Secondary | ICD-10-CM | POA: Diagnosis not present

## 2022-01-04 DIAGNOSIS — M150 Primary generalized (osteo)arthritis: Secondary | ICD-10-CM | POA: Diagnosis not present

## 2022-01-04 DIAGNOSIS — G309 Alzheimer's disease, unspecified: Secondary | ICD-10-CM | POA: Diagnosis not present

## 2022-01-04 DIAGNOSIS — J309 Allergic rhinitis, unspecified: Secondary | ICD-10-CM | POA: Diagnosis not present

## 2022-01-07 DIAGNOSIS — G301 Alzheimer's disease with late onset: Secondary | ICD-10-CM | POA: Diagnosis not present

## 2022-01-07 DIAGNOSIS — J301 Allergic rhinitis due to pollen: Secondary | ICD-10-CM | POA: Diagnosis not present

## 2022-01-07 DIAGNOSIS — K5901 Slow transit constipation: Secondary | ICD-10-CM | POA: Diagnosis not present

## 2022-01-20 DIAGNOSIS — G301 Alzheimer's disease with late onset: Secondary | ICD-10-CM | POA: Diagnosis not present

## 2022-02-01 DIAGNOSIS — G301 Alzheimer's disease with late onset: Secondary | ICD-10-CM | POA: Diagnosis not present

## 2022-02-01 DIAGNOSIS — J301 Allergic rhinitis due to pollen: Secondary | ICD-10-CM | POA: Diagnosis not present

## 2022-02-01 DIAGNOSIS — K5901 Slow transit constipation: Secondary | ICD-10-CM | POA: Diagnosis not present

## 2022-02-01 DIAGNOSIS — M150 Primary generalized (osteo)arthritis: Secondary | ICD-10-CM | POA: Diagnosis not present

## 2022-02-10 ENCOUNTER — Ambulatory Visit: Payer: Medicare Other | Admitting: Podiatry

## 2022-02-10 ENCOUNTER — Encounter: Payer: Self-pay | Admitting: Podiatry

## 2022-02-10 DIAGNOSIS — Q828 Other specified congenital malformations of skin: Secondary | ICD-10-CM

## 2022-02-10 DIAGNOSIS — B351 Tinea unguium: Secondary | ICD-10-CM

## 2022-02-10 DIAGNOSIS — M79674 Pain in right toe(s): Secondary | ICD-10-CM | POA: Diagnosis not present

## 2022-02-10 DIAGNOSIS — M79675 Pain in left toe(s): Secondary | ICD-10-CM | POA: Diagnosis not present

## 2022-02-10 NOTE — Progress Notes (Signed)
  Subjective:  Patient ID: Brenda Hammond, female    DOB: June 14, 1933,  MRN: [id number]  Brenda Hammond presents to clinic today for painful elongated mycotic toenails 1-5 bilaterally which are tender when wearing enclosed shoe gear. Pain is relieved with periodic professional debridement.    New problem(s): None.   PCP is [name], MD , and last visit was October 13, 2021.  Allergies  Allergen Reactions   Sulfa Antibiotics Anaphylaxis    REACTION: elevated HR and BP   Alendronate Other (See Comments)    "twitching" "twitching"   Ampicillin     REACTION: rash and high fever   Aspirin     REACTION: ears ringing   Cyclobenzaprine Hcl     REACTION: nightmares   Metaxalone     REACTION: nightmares   Nitrofurantoin     REACTION: elevated BP and chest pain   Nsaids     REACTION: rash   Penicillins     REACTION: rash, high fever   Sulfonamide Derivatives     REACTION: elevated HR and BP   Aricept [Donepezil] Rash    Dizziness and cramping   Nortriptyline Hcl Rash    Review of Systems: Negative except as noted in the HPI.  Objective: No changes noted in today's physical examination.  Constitutional Brenda Hammond is a pleasant 88 y.o. Caucasian female, WD, WN in NAD. AAO x 3.   Vascular CFT immediate b/l LE. Palpable DP/PT pulses b/l LE. Digital hair absent b/l. Skin temperature gradient WNL b/l. No pain with calf compression b/l. No edema noted b/l. No cyanosis or clubbing noted b/l LE.  Neurologic Normal speech. Oriented to person, place, and time. Protective sensation intact 5/5 intact bilaterally with 10g monofilament b/l. Vibratory sensation intact b/l.  Dermatologic Pedal skin thin and atrophic b/l LE. No open wounds b/l LE. No interdigital macerations noted b/l LE. Toenails 1-5 b/l elongated, discolored, dystrophic, thickened, crumbly with subungual debris and tenderness to dorsal palpation. Minimal hyperkeratosis noted plantar aspect  submet head 2, 3 right foot.   Orthopedic: Muscle strength 5/5 to all lower extremity muscle groups bilaterally. Hammertoe deformity noted 2-5 b/l.   Radiographs: None    Latest Ref Rng & Units 06/04/2021    1:32 PM  Hemoglobin A1C  Hemoglobin-A1c 4.6 - 6.5 % 6.3    Assessment/Plan: 1. Pain due to onychomycosis of toenails of both feet   2. Porokeratosis     -Discussed and educated patient on foot care, especially with  regards to the vascular, neurological and musculoskeletal systems.   -Discussed supportive shoes at all times and checking feet regularly.  -Mechanically debrided all nails 1-5 bilateral using sterile nail nipper and filed with dremel without incident  -Answered all patient questions -Patient to return  in 3 months for at risk foot care -Patient advised to call the office if any problems or questions arise in the meantime.   No follow-ups on file.  Lorenda Peck, DPM

## 2022-03-01 DIAGNOSIS — J301 Allergic rhinitis due to pollen: Secondary | ICD-10-CM | POA: Diagnosis not present

## 2022-03-01 DIAGNOSIS — K5901 Slow transit constipation: Secondary | ICD-10-CM | POA: Diagnosis not present

## 2022-03-01 DIAGNOSIS — G301 Alzheimer's disease with late onset: Secondary | ICD-10-CM | POA: Diagnosis not present

## 2022-03-01 DIAGNOSIS — M150 Primary generalized (osteo)arthritis: Secondary | ICD-10-CM | POA: Diagnosis not present

## 2022-03-15 ENCOUNTER — Telehealth: Payer: Self-pay | Admitting: Family Medicine

## 2022-03-15 NOTE — Telephone Encounter (Signed)
Left message for patient to call back and schedule Medicare Annual Wellness Visit (AWV).   Please offer to do virtually or by telephone.  Left office number and my jabber [phone].  Last AWV:10/24/2019  Please schedule at anytime with Nurse Health Advisor.

## 2022-03-23 DIAGNOSIS — G301 Alzheimer's disease with late onset: Secondary | ICD-10-CM | POA: Diagnosis not present

## 2022-03-29 ENCOUNTER — Telehealth: Payer: Self-pay | Admitting: Family Medicine

## 2022-03-29 NOTE — Telephone Encounter (Signed)
Late Entry- Received call at 3am on 9/30 that pt had fallen in the bathroom while brushing her hair.  There were no injuries noted.  Call was an [place]

## 2022-04-26 DIAGNOSIS — M150 Primary generalized (osteo)arthritis: Secondary | ICD-10-CM | POA: Diagnosis not present

## 2022-04-26 DIAGNOSIS — K5901 Slow transit constipation: Secondary | ICD-10-CM | POA: Diagnosis not present

## 2022-04-26 DIAGNOSIS — J309 Allergic rhinitis, unspecified: Secondary | ICD-10-CM | POA: Diagnosis not present

## 2022-04-26 DIAGNOSIS — G301 Alzheimer's disease with late onset: Secondary | ICD-10-CM | POA: Diagnosis not present

## 2022-04-27 DIAGNOSIS — G301 Alzheimer's disease with late onset: Secondary | ICD-10-CM | POA: Diagnosis not present

## 2022-04-29 DIAGNOSIS — K5901 Slow transit constipation: Secondary | ICD-10-CM | POA: Diagnosis not present

## 2022-04-29 DIAGNOSIS — J302 Other seasonal allergic rhinitis: Secondary | ICD-10-CM | POA: Diagnosis not present

## 2022-04-29 DIAGNOSIS — G301 Alzheimer's disease with late onset: Secondary | ICD-10-CM | POA: Diagnosis not present

## 2022-05-17 ENCOUNTER — Encounter: Payer: Self-pay | Admitting: Podiatry

## 2022-05-17 ENCOUNTER — Ambulatory Visit: Payer: Medicare Other | Admitting: Podiatry

## 2022-05-17 DIAGNOSIS — B351 Tinea unguium: Secondary | ICD-10-CM

## 2022-05-17 DIAGNOSIS — M79675 Pain in left toe(s): Secondary | ICD-10-CM | POA: Diagnosis not present

## 2022-05-17 DIAGNOSIS — M79674 Pain in right toe(s): Secondary | ICD-10-CM

## 2022-05-22 NOTE — Progress Notes (Signed)
  Subjective:  Patient ID: Brenda Hammond, female    DOB: 01-08-1933,  MRN: [id number]  Silver Achey presents to clinic today for painful thick toenails that are difficult to trim. Pain interferes with ambulation. Aggravating factors include wearing enclosed shoe gear. Pain is relieved with periodic professional debridement.   Patient has h/o dementia. She is accompanied by her daughter on today's visit. She now lives at [facility] living with her husband.  Chief Complaint  Patient presents with   Nail Problem    Routine foot care PCP-Henry Tripp PCP [facility]- Early 2023   New problem(s): None.   PCP is Reymundo Poll, MD.  Allergies  Allergen Reactions   Sulfa Antibiotics Anaphylaxis    REACTION: elevated HR and BP   Alendronate Other (See Comments)    "twitching" "twitching"   Ampicillin     REACTION: rash and high fever   Aspirin     REACTION: ears ringing   Cyclobenzaprine Hcl     REACTION: nightmares   Metaxalone     REACTION: nightmares   Nitrofurantoin     REACTION: elevated BP and chest pain   Nsaids     REACTION: rash   Penicillins     REACTION: rash, high fever   Sulfonamide Derivatives     REACTION: elevated HR and BP   Aricept [Donepezil] Rash    Dizziness and cramping   Nortriptyline Hcl Rash   Review of Systems: Negative except as noted in the HPI.  Objective: No changes noted in today's physical examination.  Rosilyn Coachman is a pleasant 88 y.o. female WD, WN in NAD. AAO x 2.  Vascular CFT immediate b/l LE. Palpable DP/PT pulses b/l LE. Digital hair absent b/l. Skin temperature gradient WNL b/l. No pain with calf compression b/l. No edema noted b/l. No cyanosis or clubbing noted b/l LE.  Neurologic Normal speech. Oriented to person, place, and time. Protective sensation intact 5/5 intact bilaterally with 10g monofilament b/l. Vibratory sensation intact b/l.  Dermatologic Pedal skin thin and atrophic b/l LE. No open wounds  b/l LE. No interdigital macerations noted b/l LE. Toenails 1-5 b/l elongated, discolored, dystrophic, thickened, crumbly with subungual debris and tenderness to dorsal palpation. Minimal hyperkeratosis noted plantar aspect submet head 2, 3 right foot.   Orthopedic: Muscle strength 5/5 to all lower extremity muscle groups bilaterally. Hammertoe deformity noted 2-5 b/l.   Radiographs: None  Assessment/Plan: 1. Pain due to onychomycosis of toenails of both feet     No orders of the defined types were placed in this encounter.   -Patient with h/o dementia/Alzheimer's/cognitive deficit. Patient's family member present. All questions/concerns addressed on today's visit. -Patient to continue soft, supportive shoe gear daily. -Toenails 1-5 b/l were debrided in length and girth with sterile nail nippers and dremel without iatrogenic bleeding.  -Patient/POA to call should there be question/concern in the interim.   Return in about 3 months (around 08/17/2022).  Marzetta Board, DPM

## 2022-05-24 DIAGNOSIS — G301 Alzheimer's disease with late onset: Secondary | ICD-10-CM | POA: Diagnosis not present

## 2022-05-24 DIAGNOSIS — K5901 Slow transit constipation: Secondary | ICD-10-CM | POA: Diagnosis not present

## 2022-05-24 DIAGNOSIS — J302 Other seasonal allergic rhinitis: Secondary | ICD-10-CM | POA: Diagnosis not present

## 2022-05-24 DIAGNOSIS — M150 Primary generalized (osteo)arthritis: Secondary | ICD-10-CM | POA: Diagnosis not present

## 2022-06-14 DIAGNOSIS — K5901 Slow transit constipation: Secondary | ICD-10-CM | POA: Diagnosis not present

## 2022-06-14 DIAGNOSIS — J302 Other seasonal allergic rhinitis: Secondary | ICD-10-CM | POA: Diagnosis not present

## 2022-06-14 DIAGNOSIS — M150 Primary generalized (osteo)arthritis: Secondary | ICD-10-CM | POA: Diagnosis not present

## 2022-06-14 DIAGNOSIS — G301 Alzheimer's disease with late onset: Secondary | ICD-10-CM | POA: Diagnosis not present

## 2022-07-08 DIAGNOSIS — Z961 Presence of intraocular lens: Secondary | ICD-10-CM | POA: Diagnosis not present

## 2022-07-13 ENCOUNTER — Encounter (HOSPITAL_COMMUNITY): Payer: Self-pay

## 2022-07-13 ENCOUNTER — Observation Stay (HOSPITAL_COMMUNITY)
Admission: EM | Admit: 2022-07-13 | Discharge: 2022-07-15 | Disposition: A | Discharge status: Home Health | Payer: Medicare Other | Attending: Internal Medicine | Admitting: Internal Medicine

## 2022-07-13 ENCOUNTER — Other Ambulatory Visit: Payer: Self-pay

## 2022-07-13 ENCOUNTER — Emergency Department (HOSPITAL_COMMUNITY): Payer: Medicare Other

## 2022-07-13 DIAGNOSIS — R6889 Other general symptoms and signs: Secondary | ICD-10-CM | POA: Diagnosis not present

## 2022-07-13 DIAGNOSIS — Z79899 Other long term (current) drug therapy: Secondary | ICD-10-CM | POA: Insufficient documentation

## 2022-07-13 DIAGNOSIS — Z743 Need for continuous supervision: Secondary | ICD-10-CM | POA: Diagnosis not present

## 2022-07-13 DIAGNOSIS — E860 Dehydration: Secondary | ICD-10-CM

## 2022-07-13 DIAGNOSIS — R262 Difficulty in walking, not elsewhere classified: Secondary | ICD-10-CM | POA: Diagnosis not present

## 2022-07-13 DIAGNOSIS — R55 Syncope and collapse: Principal | ICD-10-CM

## 2022-07-13 DIAGNOSIS — R404 Transient alteration of awareness: Secondary | ICD-10-CM | POA: Diagnosis not present

## 2022-07-13 DIAGNOSIS — R2681 Unsteadiness on feet: Secondary | ICD-10-CM | POA: Diagnosis not present

## 2022-07-13 DIAGNOSIS — I480 Paroxysmal atrial fibrillation: Secondary | ICD-10-CM | POA: Diagnosis not present

## 2022-07-13 DIAGNOSIS — Z1152 Encounter for screening for COVID-19: Secondary | ICD-10-CM | POA: Diagnosis not present

## 2022-07-13 DIAGNOSIS — J101 Influenza due to other identified influenza virus with other respiratory manifestations: Secondary | ICD-10-CM | POA: Diagnosis not present

## 2022-07-13 LAB — CBC WITH DIFFERENTIAL/PLATELET
Abs Immature Granulocytes: 0.01 10*3/uL (ref 0.00–0.07)
Basophils Absolute: 0 10*3/uL (ref 0.0–0.1)
Basophils Relative: 1 %
Eosinophils Absolute: 0.3 10*3/uL (ref 0.0–0.5)
Eosinophils Relative: 6 %
HCT: 40.4 % (ref 36.0–46.0)
Hemoglobin: 12.3 g/dL (ref 12.0–15.0)
Immature Granulocytes: 0 %
Lymphocytes Relative: 17 %
Lymphs Abs: 0.9 10*3/uL (ref 0.7–4.0)
MCH: 28.4 pg (ref 26.0–34.0)
MCHC: 30.4 g/dL (ref 30.0–36.0)
MCV: 93.3 fL (ref 80.0–100.0)
Monocytes Absolute: 0.9 10*3/uL (ref 0.1–1.0)
Monocytes Relative: 18 %
Neutro Abs: 2.9 10*3/uL (ref 1.7–7.7)
Neutrophils Relative %: 58 %
Platelets: 179 10*3/uL (ref 150–400)
RBC: 4.33 MIL/uL (ref 3.87–5.11)
RDW: 13.2 % (ref 11.5–15.5)
WBC: 5 10*3/uL (ref 4.0–10.5)
nRBC: 0 % (ref 0.0–0.2)

## 2022-07-13 LAB — COMPREHENSIVE METABOLIC PANEL
ALT: 12 U/L (ref 0–44)
AST: 25 U/L (ref 15–41)
Albumin: 3.4 g/dL — ABNORMAL LOW (ref 3.5–5.0)
Alkaline Phosphatase: 97 U/L (ref 38–126)
Anion gap: 8 (ref 5–15)
BUN: 11 mg/dL (ref 8–23)
CO2: 22 mmol/L (ref 22–32)
Calcium: 8.6 mg/dL — ABNORMAL LOW (ref 8.9–10.3)
Chloride: 108 mmol/L (ref 98–111)
Creatinine, Ser: 0.86 mg/dL (ref 0.44–1.00)
GFR, Estimated: >60 mL/min (ref >60)
Glucose, Bld: 96 mg/dL (ref 70–99)
Potassium: 3.9 mmol/L (ref 3.5–5.1)
Sodium: 138 mmol/L (ref 135–145)
Total Bilirubin: 0.4 mg/dL (ref 0.3–1.2)
Total Protein: 6.6 g/dL (ref 6.5–8.1)

## 2022-07-13 LAB — URINALYSIS, ROUTINE W REFLEX MICROSCOPIC
Bilirubin Urine: NEGATIVE
Glucose, UA: NEGATIVE mg/dL
Hgb urine dipstick: NEGATIVE
Ketones, ur: 5 mg/dL — AB
Leukocytes,Ua: NEGATIVE
Nitrite: NEGATIVE
Protein, ur: NEGATIVE mg/dL
Specific Gravity, Urine: 1.019 (ref 1.005–1.030)
pH: 5 (ref 5.0–8.0)

## 2022-07-13 LAB — LACTIC ACID, PLASMA: Lactic Acid, Venous: 1.3 mmol/L (ref 0.5–1.9)

## 2022-07-13 LAB — RESP PANEL BY RT-PCR (RSV, FLU A&B, COVID)  RVPGX2
Influenza A by PCR: POSITIVE — AB
Influenza B by PCR: NEGATIVE
Resp Syncytial Virus by PCR: NEGATIVE
SARS Coronavirus 2 by RT PCR: NEGATIVE

## 2022-07-13 MED ORDER — ONDANSETRON HCL 4 MG PO TABS: 4.0000 mg | ORAL_TABLET | Freq: Four times a day (QID) | ORAL | Status: DC | PRN

## 2022-07-13 MED ORDER — ACETAMINOPHEN 650 MG RE SUPP: 650.0000 mg | Freq: Four times a day (QID) | RECTAL | Status: DC | PRN

## 2022-07-13 MED ORDER — ONDANSETRON HCL 4 MG/2ML IJ SOLN: 4.0000 mg | Freq: Four times a day (QID) | INTRAMUSCULAR | Status: DC | PRN

## 2022-07-13 MED ORDER — OSELTAMIVIR PHOSPHATE 75 MG PO CAPS: 75.0000 mg | ORAL_CAPSULE | Freq: Once | ORAL | Status: DC

## 2022-07-13 MED ORDER — ACETAMINOPHEN 325 MG PO TABS
650.0000 mg | ORAL_TABLET | Freq: Once | ORAL | Status: AC
  Administered 2022-07-13: 650 mg via ORAL
  Filled 2022-07-13: qty 2

## 2022-07-13 MED ORDER — SODIUM CHLORIDE 0.9 % IV BOLUS
1000.0000 mL | Freq: Once | INTRAVENOUS | Status: AC
Start: 2022-07-13 — End: 2022-07-13
  Administered 2022-07-13: 1000 mL via INTRAVENOUS

## 2022-07-13 MED ORDER — ACETAMINOPHEN 325 MG PO TABS
650.0000 mg | ORAL_TABLET | Freq: Four times a day (QID) | ORAL | Status: DC | PRN
  Administered 2022-07-14: 650 mg via ORAL
  Filled 2022-07-13: qty 2

## 2022-07-13 MED ORDER — SODIUM CHLORIDE 0.9 % IV BOLUS: 500.0000 mL | Freq: Once | INTRAVENOUS | Status: DC

## 2022-07-13 MED ORDER — SODIUM CHLORIDE 0.9 % IV SOLN: INTRAVENOUS | Status: DC

## 2022-07-13 MED ORDER — OSELTAMIVIR PHOSPHATE 75 MG PO CAPS
75.0000 mg | ORAL_CAPSULE | Freq: Once | ORAL | Status: AC
  Administered 2022-07-13: 75 mg via ORAL
  Filled 2022-07-13: qty 1

## 2022-07-13 MED ORDER — ENOXAPARIN SODIUM 40 MG/0.4ML IJ SOSY
40.0000 mg | PREFILLED_SYRINGE | INTRAMUSCULAR | Status: DC
  Administered 2022-07-14: 40 mg via SUBCUTANEOUS
  Filled 2022-07-13 (×2): qty 0.4

## 2022-07-13 MED ORDER — OSELTAMIVIR PHOSPHATE 30 MG PO CAPS
30.0000 mg | ORAL_CAPSULE | Freq: Two times a day (BID) | ORAL | Status: DC
  Administered 2022-07-14 – 2022-07-15 (×3): 30 mg via ORAL
  Filled 2022-07-13 (×3): qty 1

## 2022-07-13 NOTE — ED Provider Notes (Signed)
[place] DEPT Provider Note   CSN: [id number] Arrival date & time: 07/13/22  0840     History  No chief complaint on file.   Brenda Hammond is a 89 y.o. female.  The history is provided by the patient and medical records. No language interpreter was used.  Loss of Consciousness Episode history:  Single Most recent episode:  Today Timing:  Unable to specify Progression:  Resolved Chronicity:  New Witnessed: yes   Relieved by:  Nothing Worsened by:  Nothing Ineffective treatments:  None tried Associated symptoms: headaches and recent fall   Associated symptoms: no chest pain, no confusion, no diaphoresis, no difficulty breathing, no dizziness, no fever, no malaise/fatigue, no nausea, no palpitations, no recent injury, no seizures, no shortness of breath, no visual change, no vomiting and no weakness        Home Medications Prior to Admission medications   Medication Sig Start Date End Date Taking? Authorizing Provider  Acetaminophen (TYLENOL ARTHRITIS EXT RELIEF PO) Take 650 mg by mouth 2 (two) times daily as needed (pain).    [provider]  alendronate (FOSAMAX) 70 MG tablet TAKE 1 TABLET(70 MG) BY MOUTH EVERY 7 DAYS WITH A FULL GLASS OF WATER AND ON AN EMPTY STOMACH 09/03/21   [name], MD  DIFLUCAN 150 MG tablet Take 150 mg by mouth every 3 (three) days. 02/02/22   [provider]  Menthol (HALLS COUGH DROPS) 5.8 MG LOZG Use as directed 1 lozenge in the mouth or throat daily as needed (cough).    [provider]  Psyllium Fiber 0.52 g CAPS Take 1 capsule by mouth at bedtime. 05/05/22   [provider]      Allergies    Sulfa antibiotics, Alendronate, Ampicillin, Aspirin, Cyclobenzaprine hcl, Metaxalone, Nitrofurantoin, Nsaids, Penicillins, Sulfonamide derivatives, Aricept [donepezil], and Nortriptyline hcl    Review of Systems   Review of Systems  Constitutional:  Negative for  chills, diaphoresis, fatigue, fever and malaise/fatigue.  HENT:  Negative for congestion.   Eyes:  Negative for visual disturbance.  Respiratory:  Positive for cough (on exam). Negative for chest tightness, shortness of breath and wheezing.   Cardiovascular:  Positive for syncope. Negative for chest pain and palpitations.  Gastrointestinal:  Negative for abdominal pain, constipation, diarrhea, nausea and vomiting.  Genitourinary:  Negative for dysuria and flank pain.  Musculoskeletal:  Negative for back pain, neck pain and neck stiffness.  Skin:  Negative for rash and wound.  Neurological:  Positive for syncope and headaches. Negative for dizziness, seizures, facial asymmetry, weakness, light-headedness and numbness.  Psychiatric/Behavioral:  Negative for agitation and confusion.   All other systems reviewed and are negative.   Physical Exam Updated Vital Signs BP 98/80   Pulse 60   Temp 98.6 F (37 C) (Oral)   Resp 16   Ht '5\' 1"'$  (1.549 m)   Wt 53.6 kg   SpO2 98%   BMI 22.33 kg/m  Physical Exam Vitals and nursing note reviewed.  Constitutional:      General: She is not in acute distress.    Appearance: She is well-developed. She is not ill-appearing, toxic-appearing or diaphoretic.  HENT:     Head: Normocephalic and atraumatic.     Nose: No congestion or rhinorrhea.     Mouth/Throat:     Mouth: Mucous membranes are dry.     Pharynx: No oropharyngeal exudate or posterior oropharyngeal erythema.  Eyes:     Extraocular Movements: Extraocular movements intact.  Conjunctiva/sclera: Conjunctivae normal.     Pupils: Pupils are equal, round, and reactive to light.  Cardiovascular:     Rate and Rhythm: Normal rate and regular rhythm.     Heart sounds: No murmur heard. Pulmonary:     Effort: Pulmonary effort is normal. No respiratory distress.     Breath sounds: Normal breath sounds. No wheezing, rhonchi or rales.  Chest:     Chest wall: No tenderness.  Abdominal:      General: Abdomen is flat.     Palpations: Abdomen is soft.     Tenderness: There is no abdominal tenderness. There is no right CVA tenderness, left CVA tenderness, guarding or rebound.  Musculoskeletal:        General: No swelling, tenderness or signs of injury.     Cervical back: Neck supple. No tenderness.     Right lower leg: No edema.     Left lower leg: No edema.  Skin:    General: Skin is warm and dry.     Capillary Refill: Capillary refill takes less than 2 seconds.     Findings: No erythema or rash.  Neurological:     General: No focal deficit present.     Mental Status: She is alert.     Sensory: No sensory deficit.     Motor: No weakness.  Psychiatric:        Mood and Affect: Mood normal.     ED Results / Procedures / Treatments   Labs (all labs ordered are listed, but only abnormal results are displayed) Labs Reviewed  RESP PANEL BY RT-PCR (RSV, FLU A&B, COVID)  RVPGX2 - Abnormal; Notable for the following components:      Result Value   Influenza A by PCR POSITIVE (*)    All other components within normal limits  COMPREHENSIVE METABOLIC PANEL - Abnormal; Notable for the following components:   Calcium 8.6 (*)    Albumin 3.4 (*)    All other components within normal limits  URINALYSIS, ROUTINE W REFLEX MICROSCOPIC - Abnormal; Notable for the following components:   Ketones, ur 5 (*)    All other components within normal limits  CBC WITH DIFFERENTIAL/PLATELET  LACTIC ACID, PLASMA  LACTIC ACID, PLASMA    EKG EKG Interpretation  Date/Time:  Tuesday July 13 2022 11:39:45 EST Ventricular Rate:  61 PR Interval:  120 QRS Duration: 94 QT Interval:  422 QTC Calculation: 424 R Axis:   -16 Text Interpretation: Normal sinus rhythm with sinus arrhythmia Incomplete right bundle branch block Borderline ECG No previous ECGs available when compared to prior, slower rate. No STEMI Confirmed by Antony Blackbird [phone]) on 07/13/2022 12:01:23 PM  Radiology CT HEAD WO  CONTRAST (5MM)  Result Date: 07/13/2022 CLINICAL DATA:  Syncope. EXAM: CT HEAD WITHOUT CONTRAST TECHNIQUE: Contiguous axial images were obtained from the base of the skull through the vertex without intravenous contrast. RADIATION DOSE REDUCTION: This exam was performed according to the departmental dose-optimization program which includes automated exposure control, adjustment of the mA and/or kV according to patient size and/or use of iterative reconstruction technique. COMPARISON:  CT head dated July 02, 2020. FINDINGS: Brain: No evidence of acute infarction, hemorrhage, hydrocephalus, extra-axial collection or mass lesion/mass effect. Stable atrophy and chronic microvascular ischemic changes. Vascular: Calcified atherosclerosis at the skull base. No hyperdense vessel. Skull: Normal. Negative for fracture or focal lesion. Sinuses/Orbits: No acute finding. Other: None. IMPRESSION: 1. No acute intracranial abnormality. Stable atrophy and chronic microvascular ischemic changes. Electronically Signed  By: Titus Dubin M.D.   On: 07/13/2022 11:03   DG Chest Portable 1 View  Result Date: 07/13/2022 CLINICAL DATA:  Syncopal episode, history dementia EXAM: PORTABLE CHEST 1 VIEW COMPARISON:  Portable exam 0909 hours compared to 08/04/2021 FINDINGS: Normal heart size, mediastinal contours, and pulmonary vascularity. Atherosclerotic calcification aorta. Calcified AP window and RIGHT hilar lymph nodes with a few scattered pulmonary granulomata. Lungs otherwise clear. No infiltrate, pleural effusion, pneumothorax, or acute osseous findings. IMPRESSION: Old granulomatous disease. No acute abnormalities. Aortic Atherosclerosis (ICD10-I70.0). Electronically Signed   By: Lavonia Dana M.D.   On: 07/13/2022 09:17    Procedures Procedures    Medications Ordered in ED Medications  sodium chloride 0.9 % bolus 500 mL (500 mLs Intravenous Not Given 07/13/22 1120)  sodium chloride 0.9 % bolus 1,000 mL (has no  administration in time range)  acetaminophen (TYLENOL) tablet 650 mg (650 mg Oral Given 07/13/22 1131)    ED Course/ Medical Decision Making/ A&P                             Medical Decision Making Amount and/or Complexity of Data Reviewed Labs: ordered. Radiology: ordered.  Risk OTC drugs. Decision regarding hospitalization.    Brenda Hammond is a 89 y.o. female with a past medical history significant for atrial fibrillation not on anticoagulation therapy, previous lower anterior bowel resection due to colon cancer, previous poliomyelitis, hyperlipidemia, GERD, previous appendectomy, previous hysterectomy and bilateral salpingo-oophorectomy, anemia, fibromyalgia, and memory disorder who presents for syncope.  According to EMS report as well as speaking with the patient's daughter on the phone, patient was getting ready this morning and was standing liters of [place] when she was witnessed to have her eyes were back in the back of her head and then fall backwards onto the bed.  She did not have any acute trauma and was out briefly and but was unresponsive for short time.  She then woke back up and is back to normal.  She currently has no complaints and is at her mental status baseline per family.  Patient is denying any recent fevers, chills, congestion, cough, nausea, vomiting, constipation, diarrhea, or urinary changes.  She is complaining of a slight headache in the back of her head but denies any neck pain or back pain.  Family reports she had a recent unwitnessed fall where she says that she sat on the ground but is unclear if she actually fell.  She also was witnessed to have a little bit of coughing during my evaluation that she was denying.  She otherwise has no complaints.  On my exam, lungs clear and chest nontender.  Abdomen nontender.  Patient moving all extremities with intact sensation strength and pulses in extremities.  Symmetric smile.  Clear speech.  Pupils are  symmetric and reactive with normal extraocular movements.  Neck nontender.  Back of head nontender and there was no laceration seen.  Patient resting and appears to be at her baseline.  She is constantly asking to see her husband.  Had a shared decision conversation with daughter and she agrees with getting some workup to look for abnormalities given this syncopal episode.  We will get urinalysis, chest x-ray with her coughing, and labs including COVID and viral swab.  We will get a head CT given the headache and will give her some fluids as she has dry mucous membranes and blood pressure was in the mid 90s on  arrival.  Will get a rectal temperature to make sure she is not febrile.  Daughter agrees that if workup is completely reassuring and she appears some stability she is likely able to go home and she would like to take her back to the facility.  Will await workup results to determine disposition.  1:44 PM Workup began to return and she is flu a positive.  Her urinalysis showed some ketones likely signifying some dehydration but otherwise there is no evidence of UTI.  The rest of her labs are reassuring.  Her blood pressure for last few hours has remained in the 90s.  Initially they were unable to get IV access on her so we agreed to try some oral hydration however blood pressure still not coming up above 100.  I had a shared decision-making conversation with the patient's daughter and even while just laying, her blood pressure is significant lower than it normally is.  Daughter reports her blood pressure is normally between 161 and 096 systolic.  Daughter is concerned about her having recurrent lightheadedness and syncopal episodes in the setting of her influenza diagnosis.  Given the age of 89, new syncope with reported recent fall, and softer pressures with evidence of some dehydration, we will call the IV team and get access and give her some fluids and will also call for admission overnight for  rehydration and blood pressure monitoring.  Will call for admission for further management.  Will discuss with admitting team if they feel that Tamiflu would be of benefit for this patient.        Final Clinical Impression(s) / ED Diagnoses Final diagnoses:  Syncope, unspecified syncope type  Influenza A  Dehydration    Clinical Impression: 1. Syncope, unspecified syncope type   2. Influenza A   3. Dehydration     Disposition: Admit  This note was prepared with assistance of Dragon voice recognition software. Occasional wrong-word or sound-a-like substitutions may have occurred due to the inherent limitations of voice recognition software.     Kyl Givler, Gwenyth Allegra, MD 07/13/22 [phone]

## 2022-07-13 NOTE — ED Triage Notes (Addendum)
Pt bib [facility]. Pt was standing by her bed getting ready for breakfast she stopped talking then lay back on her bed. Pt did not fall or hit her head. After one min pt reterned to baseline. Pt has pmh of dimentia. Denies pain and has no complaints at this time. Pt from [facility] care.    BP 112/50  Hr 60 Rr 18  Spo2 98%  CBG 144

## 2022-07-13 NOTE — H&P (Signed)
History and Physical    Patient: Brenda Hammond ZOX:[id number] DOB: Dec 29, 1932 DOA: 07/13/2022 DOS: the patient was seen and examined on 07/13/2022 PCP: Reymundo Poll, MD  Patient coming from: ALF/ILF  Chief Complaint:  Chief Complaint  Patient presents with   Near Syncope   HPI: Brenda Hammond is a 89 y.o. female with medical history significant of PAF, GERD, fibromyalgia was at ALF, getting ready, stood up to check in the mirror and passed out. She is a poor historian and pleasantly confused at baseline, but is able to answer some questions. She is alert and oriented to place and person. . She is brushing off the syncopal episodes. She reports some dizziness. She denies any fevers , chills, nausea, vomiting or abdominal pain.  She denies sob, cough, hematemesis or any rectal bleeding. She denies any urinary symptoms.  Some of the history also obtained from EDP.  On arrival to ED, she was afebrile, with soft BP 94/64, labs were unremarkable except for positive influenza A.  She was referred to [facility] for evaluation of syncope and management of dehydration.  Review of Systems: As mentioned in the history of present illness. All other systems reviewed and are negative. Past Medical History:  Diagnosis Date   Anemia    Anxiety    Atrial fibrillation (HCC)    Colon adenocarcinoma (HCC)    DJD (degenerative joint disease)    Fibromyalgia    GERD (gastroesophageal reflux disease)    History of poliomyelitis    History of UTI    Hyperlipemia    Memory disorder 03/01/2019   Other drug allergy(995.27)    Scoliosis    Surgical or other procedure not carried out because of patient's decision    Venous insufficiency    Past Surgical History:  Procedure Laterality Date   APPENDECTOMY     LOW ANTERIOR BOWEL RESECTION  06/2009   with anastomosis for colon cancer   OTHER SURGICAL HISTORY     T & A   TOTAL ABDOMINAL HYSTERECTOMY W/ BILATERAL SALPINGOOPHORECTOMY  1980s   Social  History:  reports that she has never smoked. She has never used smokeless tobacco. She reports that she does not drink alcohol and does not use drugs.  Allergies  Allergen Reactions   Sulfa Antibiotics Anaphylaxis    REACTION: elevated HR and BP   Alendronate Other (See Comments)    "twitching" "twitching"   Ampicillin     REACTION: rash and high fever   Aspirin     REACTION: ears ringing   Cyclobenzaprine Hcl     REACTION: nightmares   Metaxalone     REACTION: nightmares   Nitrofurantoin     REACTION: elevated BP and chest pain   Nsaids     REACTION: rash   Penicillins     REACTION: rash, high fever   Sulfonamide Derivatives     REACTION: elevated HR and BP   Aricept [Donepezil] Rash    Dizziness and cramping   Nortriptyline Hcl Rash    Family History  Problem Relation Age of Onset   Stroke Father    Cancer Mother        brain   Colon cancer Neg Hx     Prior to Admission medications   Medication Sig Start Date End Date Taking? Authorizing Provider  Acetaminophen (TYLENOL ARTHRITIS EXT RELIEF PO) Take 650 mg by mouth 2 (two) times daily as needed (pain).    [provider]  alendronate (FOSAMAX) 70 MG tablet TAKE  1 TABLET(70 MG) BY MOUTH EVERY 7 DAYS WITH A FULL GLASS OF WATER AND ON AN EMPTY STOMACH 09/03/21   [name], MD  DIFLUCAN 150 MG tablet Take 150 mg by mouth every 3 (three) days. 02/02/22   [provider]  Menthol (HALLS COUGH DROPS) 5.8 MG LOZG Use as directed 1 lozenge in the mouth or throat daily as needed (cough).    [provider]  Psyllium Fiber 0.52 g CAPS Take 1 capsule by mouth at bedtime. 05/05/22   [provider]    Physical Exam: Vitals:   07/13/22 0855 07/13/22 0856 07/13/22 0905 07/13/22 1230  BP:  94/64  98/80  Pulse:  62  60  Resp:  16  16  Temp:   99.2 F (37.3 C) 98.6 F (37 C)  TempSrc:   Rectal Oral  SpO2:  96%  98%  Weight: 53.6 kg     Height: '5\' 1"'$  (1.549 m)      General exam:  Appears calm and comfortable  Respiratory system: Clear to auscultation. Respiratory effort normal. Cardiovascular system: S1 & S2 heard, RRR. No JVD, murmurs,  Gastrointestinal system: Abdomen is nondistended, soft and nontender. Central nervous system: Alert and oriented to place and person, slightly confused. . No focal neurological deficits. Extremities: Symmetric 5 x 5 power. Skin: No rashes, lesions or ulcers or bruising.  Psychiatry:  Mood & affect appropriate.   Data Reviewed: Results for orders placed or performed during the hospital encounter of 07/13/22 (from the past 24 hour(s))  CBC with Differential     Status: None   Collection Time: 07/13/22  9:50 AM  Result Value Ref Range   WBC 5.0 4.0 - 10.5 K/uL   RBC 4.33 3.87 - 5.11 MIL/uL   Hemoglobin 12.3 12.0 - 15.0 g/dL   HCT 40.4 36.0 - 46.0 %   MCV 93.3 80.0 - 100.0 fL   MCH 28.4 26.0 - 34.0 pg   MCHC 30.4 30.0 - 36.0 g/dL   RDW 13.2 11.5 - 15.5 %   Platelets 179 150 - 400 K/uL   nRBC 0.0 0.0 - 0.2 %   Neutrophils Relative % 58 %   Neutro Abs 2.9 1.7 - 7.7 K/uL   Lymphocytes Relative 17 %   Lymphs Abs 0.9 0.7 - 4.0 K/uL   Monocytes Relative 18 %   Monocytes Absolute 0.9 0.1 - 1.0 K/uL   Eosinophils Relative 6 %   Eosinophils Absolute 0.3 0.0 - 0.5 K/uL   Basophils Relative 1 %   Basophils Absolute 0.0 0.0 - 0.1 K/uL   Immature Granulocytes 0 %   Abs Immature Granulocytes 0.01 0.00 - 0.07 K/uL  Comprehensive metabolic panel     Status: Abnormal   Collection Time: 07/13/22  9:50 AM  Result Value Ref Range   Sodium 138 135 - 145 mmol/L   Potassium 3.9 3.5 - 5.1 mmol/L   Chloride 108 98 - 111 mmol/L   CO2 22 22 - 32 mmol/L   Glucose, Bld 96 70 - 99 mg/dL   BUN 11 8 - 23 mg/dL   Creatinine, Ser 0.86 0.44 - 1.00 mg/dL   Calcium 8.6 (L) 8.9 - 10.3 mg/dL   Total Protein 6.6 6.5 - 8.1 g/dL   Albumin 3.4 (L) 3.5 - 5.0 g/dL   AST 25 15 - 41 U/L   ALT 12 0 - 44 U/L   Alkaline Phosphatase 97 38 - 126 U/L   Total  Bilirubin 0.4 0.3 - 1.2  mg/dL   GFR, Estimated >60 >60 mL/min   Anion gap 8 5 - 15  Lactic acid, plasma     Status: None   Collection Time: 07/13/22  9:50 AM  Result Value Ref Range   Lactic Acid, Venous 1.3 0.5 - 1.9 mmol/L  Resp panel by RT-PCR (RSV, Flu A&B, Covid) Anterior Nasal Swab     Status: Abnormal   Collection Time: 07/13/22  9:50 AM   Specimen: Anterior Nasal Swab  Result Value Ref Range   SARS Coronavirus 2 by RT PCR NEGATIVE NEGATIVE   Influenza A by PCR POSITIVE (A) NEGATIVE   Influenza B by PCR NEGATIVE NEGATIVE   Resp Syncytial Virus by PCR NEGATIVE NEGATIVE  Urinalysis, Routine w reflex microscopic Urine, In & Out Cath     Status: Abnormal   Collection Time: 07/13/22 12:56 PM  Result Value Ref Range   Color, Urine YELLOW YELLOW   APPearance CLEAR CLEAR   Specific Gravity, Urine 1.019 1.005 - 1.030   pH 5.0 5.0 - 8.0   Glucose, UA NEGATIVE NEGATIVE mg/dL   Hgb urine dipstick NEGATIVE NEGATIVE   Bilirubin Urine NEGATIVE NEGATIVE   Ketones, ur 5 (A) NEGATIVE mg/dL   Protein, ur NEGATIVE NEGATIVE mg/dL   Nitrite NEGATIVE NEGATIVE   Leukocytes,Ua NEGATIVE NEGATIVE     Assessment and Plan:  Syncope  Suspect orthostatic hypotension from dehydration possibly from the Influenza A infection.  Monitor overnight on telemetry for any arrhythmias.  Check echocardiogram.  Check orthostatic vital signs. Start her on IV fluids.  Check EEG for evaluation of seizures as this happened before.    Influenza A  - tamiflu started.    Paroxysmal atrial fibrillation Rate controlled.     GERD stable.    Slightly elevated BP parameters.      Advance Care Planning:   Code Status: DNR   Consults: none.   Family Communication: discussed with daughter over the phone.   Severity of Illness: The appropriate patient status for this patient is OBSERVATION. Observation status is judged to be reasonable and necessary in order to provide the required intensity of service  to ensure the patient's safety. The patient's presenting symptoms, physical exam findings, and initial radiographic and laboratory data in the context of their medical condition is felt to place them at decreased risk for further clinical deterioration. Furthermore, it is anticipated that the patient will be medically stable for discharge from the hospital within 2 midnights of admission.   Author: Hosie Poisson, MD 07/13/2022 4:35 PM  For on call review [email or web address].

## 2022-07-13 NOTE — ED Notes (Signed)
Pt c/o pain in her feet- provider notified

## 2022-07-14 ENCOUNTER — Other Ambulatory Visit (HOSPITAL_COMMUNITY): Payer: Self-pay

## 2022-07-14 ENCOUNTER — Observation Stay (HOSPITAL_COMMUNITY)
Admit: 2022-07-14 | Discharge: 2022-07-14 | Disposition: A | Discharge status: Home / Self Care | Payer: Medicare Other | Attending: Internal Medicine | Admitting: Internal Medicine

## 2022-07-14 ENCOUNTER — Observation Stay (HOSPITAL_COMMUNITY): Payer: Medicare Other

## 2022-07-14 DIAGNOSIS — R55 Syncope and collapse: Secondary | ICD-10-CM | POA: Diagnosis not present

## 2022-07-14 LAB — CBC
HCT: 35.4 % — ABNORMAL LOW (ref 36.0–46.0)
Hemoglobin: 11.2 g/dL — ABNORMAL LOW (ref 12.0–15.0)
MCH: 28.8 pg (ref 26.0–34.0)
MCHC: 31.6 g/dL (ref 30.0–36.0)
MCV: 91 fL (ref 80.0–100.0)
Platelets: 188 10*3/uL (ref 150–400)
RBC: 3.89 MIL/uL (ref 3.87–5.11)
RDW: 13.2 % (ref 11.5–15.5)
WBC: 4.4 10*3/uL (ref 4.0–10.5)
nRBC: 0 % (ref 0.0–0.2)

## 2022-07-14 LAB — COMPREHENSIVE METABOLIC PANEL
ALT: 11 U/L (ref 0–44)
AST: 22 U/L (ref 15–41)
Albumin: 2.9 g/dL — ABNORMAL LOW (ref 3.5–5.0)
Alkaline Phosphatase: 84 U/L (ref 38–126)
Anion gap: 6 (ref 5–15)
BUN: 11 mg/dL (ref 8–23)
CO2: 26 mmol/L (ref 22–32)
Calcium: 7.8 mg/dL — ABNORMAL LOW (ref 8.9–10.3)
Chloride: 106 mmol/L (ref 98–111)
Creatinine, Ser: 0.75 mg/dL (ref 0.44–1.00)
GFR, Estimated: >60 mL/min (ref >60)
Glucose, Bld: 84 mg/dL (ref 70–99)
Potassium: 3.7 mmol/L (ref 3.5–5.1)
Sodium: 138 mmol/L (ref 135–145)
Total Bilirubin: 0.3 mg/dL (ref 0.3–1.2)
Total Protein: 5.7 g/dL — ABNORMAL LOW (ref 6.5–8.1)

## 2022-07-14 LAB — ECHOCARDIOGRAM COMPLETE
Height: 61 in
S' Lateral: 2.4 cm
Weight: 1890.66 oz

## 2022-07-14 LAB — BASIC METABOLIC PANEL
Anion gap: 6 (ref 5–15)
BUN: 10 mg/dL (ref 8–23)
CO2: 25 mmol/L (ref 22–32)
Calcium: 8 mg/dL — ABNORMAL LOW (ref 8.9–10.3)
Chloride: 107 mmol/L (ref 98–111)
Creatinine, Ser: 0.76 mg/dL (ref 0.44–1.00)
GFR, Estimated: >60 mL/min (ref >60)
Glucose, Bld: 87 mg/dL (ref 70–99)
Potassium: 3.5 mmol/L (ref 3.5–5.1)
Sodium: 138 mmol/L (ref 135–145)

## 2022-07-14 LAB — CBC WITH DIFFERENTIAL/PLATELET
Abs Immature Granulocytes: 0.01 10*3/uL (ref 0.00–0.07)
Basophils Absolute: 0 10*3/uL (ref 0.0–0.1)
Basophils Relative: 1 %
Eosinophils Absolute: 0.3 10*3/uL (ref 0.0–0.5)
Eosinophils Relative: 8 %
HCT: 30.2 % — ABNORMAL LOW (ref 36.0–46.0)
Hemoglobin: 9.4 g/dL — ABNORMAL LOW (ref 12.0–15.0)
Immature Granulocytes: 0 %
Lymphocytes Relative: 20 %
Lymphs Abs: 0.8 10*3/uL (ref 0.7–4.0)
MCH: 29.4 pg (ref 26.0–34.0)
MCHC: 31.1 g/dL (ref 30.0–36.0)
MCV: 94.4 fL (ref 80.0–100.0)
Monocytes Absolute: 0.6 10*3/uL (ref 0.1–1.0)
Monocytes Relative: 16 %
Neutro Abs: 2.1 10*3/uL (ref 1.7–7.7)
Neutrophils Relative %: 55 %
Platelets: 127 10*3/uL — ABNORMAL LOW (ref 150–400)
RBC: 3.2 MIL/uL — ABNORMAL LOW (ref 3.87–5.11)
RDW: 13.2 % (ref 11.5–15.5)
WBC: 3.7 10*3/uL — ABNORMAL LOW (ref 4.0–10.5)
nRBC: 0 % (ref 0.0–0.2)

## 2022-07-14 LAB — PROTIME-INR
INR: 1.1 (ref 0.8–1.2)
Prothrombin Time: 13.8 seconds (ref 11.4–15.2)

## 2022-07-14 MED ORDER — OLANZAPINE 10 MG IM SOLR
2.0000 mg | Freq: Once | INTRAMUSCULAR | Status: AC | PRN
  Administered 2022-07-14: 2 mg via INTRAMUSCULAR
  Filled 2022-07-14: qty 10

## 2022-07-14 MED ORDER — STERILE WATER FOR INJECTION IJ SOLN
INTRAMUSCULAR | Status: AC
  Administered 2022-07-14: 2.1 mL
  Filled 2022-07-14: qty 10

## 2022-07-14 MED ORDER — OSELTAMIVIR PHOSPHATE 30 MG PO CAPS
30.0000 mg | ORAL_CAPSULE | Freq: Two times a day (BID) | ORAL | 0 refills | Status: DC
  Filled 2022-07-14: qty 8, 4d supply, fill #0

## 2022-07-14 NOTE — Evaluation (Signed)
Physical Therapy Evaluation Patient Details Name: Kalani Baray MRN: [id number] DOB: 1932-10-22 Today's Date: 07/14/2022  History of Present Illness  Karra Pink is a 89 y.o. female with medical history significant of PAF, GERD, fibromyalgia was at ALF, getting ready, stood up to check in the mirror and passed out. She is a poor historian and pleasantly confused at baseline. Patient presents with syncope.  Clinical Impression  Pt admitted with above diagnosis.  Pt currently with functional limitations due to the deficits listed below (see PT Problem List). Pt will benefit from skilled PT to increase their independence and safety with mobility to allow discharge to the venue listed below.     The patient is pleasantly confused. Ambulated with min guard x 300' (200' without RW, light HHA) Patient should be able to return to the ALF.      Recommendations for follow up therapy are one component of a multi-disciplinary discharge planning process, led by the attending physician.  Recommendations may be updated based on patient status, additional functional criteria and insurance authorization.  Follow Up Recommendations Home health PT (at  her)      Assistance Recommended at Discharge Frequent or constant Supervision/Assistance  Patient can return home with the following  A little help with walking and/or transfers;A little help with bathing/dressing/bathroom    Equipment Recommendations None recommended by PT  Recommendations for Other Services       Functional Status Assessment Patient has had a recent decline in their functional status and demonstrates the ability to make significant improvements in function in a reasonable and predictable amount of time.     Precautions / Restrictions Precautions Precautions: Fall Restrictions Weight Bearing Restrictions: No      Mobility  Bed Mobility         Supine to sit: Min assist     General bed mobility  comments: Min assist for hand hold to transfer into sitting on stretcher, assist legs onto bed as patient was not following directions to return to supine    Transfers Overall transfer level: Needs assistance Equipment used: Rolling walker (2 wheels)               General transfer comment: Initally use of walker as she was unsteady with initial stand    Ambulation/Gait Ambulation/Gait assistance: Min assist Gait Distance (Feet): 300 Feet Assistive device: Rolling walker (2 wheels), 1 person hand held assist Gait Pattern/deviations: Step-through pattern       General Gait Details: patient "dancing" at times, ambulated 100' with Rw then 200' with min guard.  Stairs            Wheelchair Mobility    Modified Rankin (Stroke Patients Only)       Balance Overall balance assessment: Mild deficits observed, not formally tested                                           Pertinent Vitals/Pain Pain Assessment Pain Assessment: No/denies pain    Home Living Family/patient expects to be discharged to:: Assisted living                 Home Equipment: None Additional Comments: Patient unable to answer PLOF.    Prior Function               Mobility Comments: no device ADLs Comments: may need intermittent supervision at baseline  Hand Dominance   Dominant Hand: Right    Extremity/Trunk Assessment   Upper Extremity Assessment Upper Extremity Assessment: Overall WFL for tasks assessed    Lower Extremity Assessment Lower Extremity Assessment: Overall WFL for tasks assessed    Cervical / Trunk Assessment Cervical / Trunk Assessment: Normal  Communication   Communication: No difficulties  Cognition Arousal/Alertness: Awake/alert Behavior During Therapy: WFL for tasks assessed/performed Overall Cognitive Status: History of cognitive impairments - at baseline                                          General  Comments      Exercises     Assessment/Plan    PT Assessment Patient needs continued PT services  PT Problem List Decreased strength;Decreased cognition;Decreased activity tolerance;Decreased safety awareness;Decreased knowledge of use of DME       PT Treatment Interventions DME instruction;Therapeutic activities;Cognitive remediation;Gait training;Therapeutic exercise;Patient/family education;Functional mobility training    PT Goals (Current goals can be found in the Care Plan section)  Acute Rehab PT Goals PT Goal Formulation: Patient unable to participate in goal setting Time For Goal Achievement: 07/28/22 Potential to Achieve Goals: Good    Frequency Min 2X/week     Co-evaluation               AM-PAC PT "6 Clicks" Mobility  Outcome Measure Help needed turning from your back to your side while in a flat bed without using bedrails?: A Little Help needed moving from lying on your back to sitting on the side of a flat bed without using bedrails?: A Little Help needed moving to and from a bed to a chair (including a wheelchair)?: A Little Help needed standing up from a chair using your arms (e.g., wheelchair or bedside chair)?: A Little Help needed to walk in hospital room?: A Little Help needed climbing 3-5 steps with a railing? : A Lot 6 Click Score: 17    End of Session Equipment Utilized During Treatment: Gait belt Activity Tolerance: Patient tolerated treatment well Patient left: in bed Nurse Communication: Mobility status PT Visit Diagnosis: Unsteadiness on feet (R26.81);Difficulty in walking, not elsewhere classified (R26.2)    Time: 5956-3875 PT Time Calculation (min) (ACUTE ONLY): 12 min   Charges:   PT Evaluation $PT Eval Low Complexity: 1 Low          [place] Office [phone] Weekend [phone]   Claretha Cooper 07/14/2022, 12:26 PM

## 2022-07-14 NOTE — Progress Notes (Signed)
  Echocardiogram 2D Echocardiogram attempted at 1500. Obtained images of the PLAX and SAX AV before pt refused to continue exam. I have informed the care team. Will attempt again as schedule permits.  Eartha Inch 07/14/2022, 3:27 PM

## 2022-07-14 NOTE — ED Notes (Addendum)
Per Eartha Inch she attempted the echo on Ms. Brenda Hammond. She was only able to obtain a few images before the pt. refused to continue the exam. She notified cardiology.

## 2022-07-14 NOTE — Hospital Course (Addendum)
89 y.o. female with medical history significant of PAF, GERD, fibromyalgia was at ALF, getting ready, stood up to check in the mirror and passed out. She is a poor historian and pleasantly confused at baseline, but is able to answer some questions. She is alert and oriented to place and person. . She is brushing off the syncopal episodes. She reports some dizziness. She denies any fevers , chills, nausea, vomiting or abdominal pain.  She denies sob, cough, hematemesis or any rectal bleeding. She denies any urinary symptoms.  Some of the history also obtained from EDP.  On arrival to ED, she was afebrile, with soft BP 94/64, labs were unremarkable except for positive influenza A.  She was referred to [facility] for evaluation of syncope and management of dehydration. Orthostatics was done and negative. PT OT eval done and recommended HHPTOT. DAUGHTER wanting to take patient to back to [facility] Once TTE and EEG resulted and if normal. Overall labs are stable. Vitals stable. Echocardiogram although limited no acute finding EEG no evidence of seizure. 1/18 morning after overnight good rest patient is back to baseline alert awake oriented fairly oriented.  She will be discharged back to facility to complete Tamiflu course

## 2022-07-14 NOTE — Procedures (Signed)
Patient Name: Brenda Hammond  MRN: [id number]  Epilepsy Attending: Lora Havens  Referring Physician/Provider: Hosie Poisson, MD  Date: 07/14/2022 Duration: 22.18 mins  Patient history: 89yo f with syncope. EEG to evaluate for seizure.   Level of alertness: Awake  AEDs during EEG study: None  Technical aspects: This EEG study was done with scalp electrodes positioned according to the 10-20 International system of electrode placement. Electrical activity was reviewed with band pass filter of 1-'70Hz'$ , sensitivity of 7 uV/mm, display speed of 66m/sec with a '60Hz'$  notched filter applied as appropriate. EEG data were recorded continuously and digitally stored.  Video monitoring was available and reviewed as appropriate.  Description: The posterior dominant rhythm consists of 7 Hz activity of moderate voltage (25-35 uV) seen predominantly in posterior head regions, symmetric and reactive to eye opening and eye closing.  Hyperventilation and photic stimulation were not performed.     ABNORMALITY - Background slow  IMPRESSION: This study is suggestive of mild diffuse encephalopathy, nonspecific etiology. No seizures or epileptiform discharges were seen throughout the recording.  [name] [name]

## 2022-07-14 NOTE — Evaluation (Signed)
Occupational Therapy Evaluation Patient Details Name: Brenda Hammond MRN: [id number] DOB: 21-Mar-1933 Today's Date: 07/14/2022   History of Present Illness Brenda Hammond is a 89 y.o. female with medical history significant of PAF, GERD, fibromyalgia was at ALF, getting ready, stood up to check in the mirror and passed out. She is a poor historian and pleasantly confused at baseline. Patient presents with syncope.   Clinical Impression   Brenda Hammond is an 89 year old woman who demonstrated ability to ambulate without a device and perform ADLs. She is pleasantly confused. Needed cues to stay on task. Initially used walker but then advanced to not need it.Patient appears to be at her baseline. Can return to her facility.     Recommendations for follow up therapy are one component of a multi-disciplinary discharge planning process, led by the attending physician.  Recommendations may be updated based on patient status, additional functional criteria and insurance authorization.   Follow Up Recommendations  No OT follow up     Assistance Recommended at Discharge Intermittent Supervision/Assistance  Patient can return home with the following Assistance with cooking/housework;Direct supervision/assist for medications management;Direct supervision/assist for financial management    Functional Status Assessment  Patient has not had a recent decline in their functional status  Equipment Recommendations  None recommended by OT    Recommendations for Other Services       Precautions / Restrictions Precautions Precautions: None Restrictions Weight Bearing Restrictions: No      Mobility Bed Mobility Overal bed mobility: Needs Assistance Bed Mobility: Supine to Sit, Sit to Supine     Supine to sit: Min assist     General bed mobility comments: Min assist for hand hold to ttansfer into sitting on stretcher    Transfers                   General  transfer comment: Initally use of walker as she was unsteady with initial stand but then advanced to no device or assistance needed to ambulate.      Balance Overall balance assessment: Mild deficits observed, not formally tested                                         ADL either performed or assessed with clinical judgement   ADL Overall ADL's : At baseline                                       General ADL Comments: Supervision to stay on task. no physical assistance .     Vision   Vision Assessment?: No apparent visual deficits     Perception     Praxis      Pertinent Vitals/Pain Pain Assessment Pain Assessment: No/denies pain     Hand Dominance Right   Extremity/Trunk Assessment Upper Extremity Assessment Upper Extremity Assessment: Overall WFL for tasks assessed   Lower Extremity Assessment Lower Extremity Assessment: Defer to PT evaluation   Cervical / Trunk Assessment Cervical / Trunk Assessment: Normal   Communication Communication Communication: No difficulties   Cognition Arousal/Alertness: Awake/alert Behavior During Therapy: WFL for tasks assessed/performed Overall Cognitive Status: History of cognitive impairments - at baseline  General Comments       Exercises     Shoulder Instructions      Home Living Family/patient expects to be discharged to:: Assisted living                             Home Equipment: None   Additional Comments: Patient unable to answer PLOF.      Prior Functioning/Environment               Mobility Comments: no device ADLs Comments: may need intermittent supervision at baseline        OT Problem List:        OT Treatment/Interventions:      OT Goals(Current goals can be found in the care plan section) Acute Rehab OT Goals OT Goal Formulation: Patient unable to participate in goal setting  OT  Frequency:      Co-evaluation PT/OT/SLP Co-Evaluation/Treatment: Yes (coeval)            AM-PAC OT "6 Clicks" Daily Activity     Outcome Measure Help from another person eating meals?: None Help from another person taking care of personal grooming?: None Help from another person toileting, which includes using toliet, bedpan, or urinal?: None Help from another person bathing (including washing, rinsing, drying)?: None Help from another person to put on and taking off regular upper body clothing?: None Help from another person to put on and taking off regular lower body clothing?: None 6 Click Score: 24   End of Session Equipment Utilized During Treatment: Gait belt Nurse Communication: Mobility status  Activity Tolerance: Patient tolerated treatment well Patient left: in bed;with call bell/phone within reach  OT Visit Diagnosis: History of falling (Z91.81)                Time: 2585-2778 OT Time Calculation (min): 11 min Charges:  OT General Charges $OT Visit: 1 Visit OT Evaluation $OT Eval Low Complexity: 1 Low  Gustavo Lah, OTR/L [place]  Office [phone]   Lenward Chancellor 07/14/2022, 9:18 AM

## 2022-07-14 NOTE — Progress Notes (Signed)
PROGRESS NOTE Brenda Hammond  QMV:[id number] DOB: 11/19/1932 DOA: 07/13/2022 PCP: Reymundo Poll, MD   Brief Narrative/Hospital Course:  89 y.o. female with medical history significant of PAF, GERD, fibromyalgia was at ALF, getting ready, stood up to check in the mirror and passed out. She is a poor historian and pleasantly confused at baseline, but is able to answer some questions. She is alert and oriented to place and person. . She is brushing off the syncopal episodes. She reports some dizziness. She denies any fevers , chills, nausea, vomiting or abdominal pain.  She denies sob, cough, hematemesis or any rectal bleeding. She denies any urinary symptoms.  Some of the history also obtained from EDP.  On arrival to ED, she was afebrile, with soft BP 94/64, labs were unremarkable except for positive influenza A.  She was referred to [facility] for evaluation of syncope and management of dehydration.      Subjective: Seen and examined In hallway waiting for room Eating, RN reports she has been abel to ambulate, on RA Overnight BP stable afebrile Labs reviewed mild leukopenia 3.7 anemia 9.4 platelet 127 Influenza was positive on admission UA negative   Assessment and Plan: Principal Problem:   Syncope   Syncope suspect orthostatic hypotension in the setting of dehydration and influenza infection : Lost IV access has not been getting IV fluids.  Asked nursing to check orthostatic vitals this morning Pending echocardiogram EEG for workup.  Request PT OT evaluation.  Influenza A infection continue Tamiflu Paroxysmal atrial fibrillation rate is controlled.  Not on rate controlling medication or anticoagulation at baseline> advised to follow-up with her cardiology GERD stable Slightly elevated blood pressure on admission: but soft today  DVT prophylaxis: enoxaparin (LOVENOX) injection 40 mg Start: 07/14/22 1000 Code Status:   Code Status: DNR Family Communication: plan of care discussed  with patient/NO FAMILY at bedside.  Discussed with nursing staff at bedside regarding plan of care  Patient status is: Admitted observation but remains hospitalized due to ongoing evaluation of syncope.  Level of care: Telemetry  Dispo: The patient is from: She is from [facility]            Anticipated disposition: TBD Mobility Assessment (last 72 hours)     Mobility Assessment     Row Name 07/13/22 20:41:02           Does patient have an order for bedrest or is patient medically unstable No - Continue assessment       What is the highest level of mobility based on the progressive mobility assessment? Level 5 (Walks with assist in room/hall) - Balance while stepping forward/back and can walk in room with assist - Complete                 Objective: Vitals last 24 hrs: Vitals:   07/13/22 1732 07/13/22 2231 07/14/22 0235 07/14/22 0619  BP: (!) 159/69 131/80 (!) 141/62 135/69  Pulse: 67 70 69 70  Resp: '16 16 16 18  '$ Temp: 98.7 F (37.1 C) 98.7 F (37.1 C) 98.4 F (36.9 C) 97.9 F (36.6 C)  TempSrc:  Oral  Oral  SpO2: 97% 99% 97% 98%  Weight:      Height:       Weight change:   Physical Examination: General exam: alert awake oriented to self only not to date, place people, older than stated age HEENT:Oral mucosa moist, Ear/Nose WNL grossly Respiratory system: bilaterally clear BS, no use of accessory muscle Cardiovascular system: S1 &  S2 +, No JVD. Gastrointestinal system: Abdomen soft,NT,ND, BS+ Nervous System:Alert, awake, moving extremities. Extremities: LE edema neg,distal peripheral pulses palpable.  Skin: No rashes,no icterus. MSK: Normal muscle bulk,tone, power  Medications reviewed:  Scheduled Meds:  enoxaparin (LOVENOX) injection  40 mg Subcutaneous Q24H   oseltamivir  30 mg Oral BID   Continuous Infusions:  sodium chloride 75 mL/hr at 07/14/22 0045   sodium chloride      Diet Order             Diet regular Room service appropriate? Yes;  Fluid consistency: Thin  Diet effective now                   Intake/Output Summary (Last 24 hours) at 07/14/2022 0859 Last data filed at 07/13/2022 1733 Gross per 24 hour  Intake 1000 ml  Output --  Net 1000 ml   Net IO Since Admission: 1,000 mL [07/14/22 0859]  Wt Readings from Last 3 Encounters:  07/13/22 53.6 kg  10/13/21 53.6 kg  08/04/21 54.4 kg     Unresulted Labs (From admission, onward)     Start     Ordered   07/20/22 0500  Creatinine, serum  (enoxaparin (LOVENOX)    CrCl >/= 30 ml/min)  Weekly,   R     Comments: while on enoxaparin therapy    07/13/22 2223   07/13/22 0902  Lactic acid, plasma  Now then every 2 hours,   R      07/13/22 0901          Data Reviewed: I have personally reviewed following labs and imaging studies CBC: Recent Labs  Lab 07/13/22 0950 07/14/22 0420 07/14/22 0556  WBC 5.0 4.4 3.7*  NEUTROABS 2.9  --  2.1  HGB 12.3 11.2* 9.4*  HCT 40.4 35.4* 30.2*  MCV 93.3 91.0 94.4  PLT 179 188 209*   Basic Metabolic Panel: Recent Labs  Lab 07/13/22 0950 07/14/22 0420 07/14/22 0630  NA 138 138 138  K 3.9 3.7 3.5  CL 108 106 107  CO2 '22 26 25  '$ GLUCOSE 96 84 87  BUN '11 11 10  '$ CREATININE 0.86 0.75 0.76  CALCIUM 8.6* 7.8* 8.0*   GFR: Estimated Creatinine Clearance: 37.4 mL/min (by C-G formula based on SCr of 0.76 mg/dL). Liver Function Tests: Recent Labs  Lab 07/13/22 0950 07/14/22 0420  AST 25 22  ALT 12 11  ALKPHOS 97 84  BILITOT 0.4 0.3  PROT 6.6 5.7*  ALBUMIN 3.4* 2.9*   No results for input(s): "LIPASE", "AMYLASE" in the last 168 hours. No results for input(s): "AMMONIA" in the last 168 hours. Coagulation Profile: Recent Labs  Lab 07/14/22 0420  INR 1.1   BNP (last 3 results) No results for input(s): "PROBNP" in the last 8760 hours. HbA1C: No results for input(s): "HGBA1C" in the last 72 hours. CBG: No results for input(s): "GLUCAP" in the last 168 hours. Lipid Profile: No results for input(s): "CHOL",  "HDL", "LDLCALC", "TRIG", "CHOLHDL", "LDLDIRECT" in the last 72 hours. Thyroid Function Tests: No results for input(s): "TSH", "T4TOTAL", "FREET4", "T3FREE", "THYROIDAB" in the last 72 hours. Sepsis Labs: Recent Labs  Lab 07/13/22 0950  LATICACIDVEN 1.3    Recent Results (from the past 240 hour(s))  Resp panel by RT-PCR (RSV, Flu A&B, Covid) Anterior Nasal Swab     Status: Abnormal   Collection Time: 07/13/22  9:50 AM   Specimen: Anterior Nasal Swab  Result Value Ref Range Status   SARS Coronavirus 2 by  RT PCR NEGATIVE NEGATIVE Final    Comment: (NOTE) SARS-CoV-2 target nucleic acids are NOT DETECTED.  The SARS-CoV-2 RNA is generally detectable in upper respiratory specimens during the acute phase of infection. The lowest concentration of SARS-CoV-2 viral copies this assay can detect is 138 copies/mL. A negative result does not preclude SARS-Cov-2 infection and should not be used as the sole basis for treatment or other patient management decisions. A negative result may occur with  improper specimen collection/handling, submission of specimen other than nasopharyngeal swab, presence of viral mutation(s) within the areas targeted by this assay, and inadequate number of viral copies(<138 copies/mL). A negative result must be combined with clinical observations, patient history, and epidemiological information. The expected result is Negative.  Fact Sheet for Patients:  EntrepreneurPulse.com.au  Fact Sheet for Healthcare Providers:  IncredibleEmployment.be  This test is no t yet approved or cleared by the [place] FDA and  has been authorized for detection and/or diagnosis of SARS-CoV-2 by FDA under an Emergency Use Authorization (EUA). This EUA will remain  in effect (meaning this test can be used) for the duration of the COVID-19 declaration under Section 564(b)(1) of the Act, 21 U.S.C.section 360bbb-3(b)(1), unless the authorization  is terminated  or revoked sooner.       Influenza A by PCR POSITIVE (A) NEGATIVE Final   Influenza B by PCR NEGATIVE NEGATIVE Final    Comment: (NOTE) The Xpert Xpress SARS-CoV-2/FLU/RSV plus assay is intended as an aid in the diagnosis of influenza from Nasopharyngeal swab specimens and should not be used as a sole basis for treatment. Nasal washings and aspirates are unacceptable for Xpert Xpress SARS-CoV-2/FLU/RSV testing.  Fact Sheet for Patients: EntrepreneurPulse.com.au  Fact Sheet for Healthcare Providers: IncredibleEmployment.be  This test is not yet approved or cleared by the [place] FDA and has been authorized for detection and/or diagnosis of SARS-CoV-2 by FDA under an Emergency Use Authorization (EUA). This EUA will remain in effect (meaning this test can be used) for the duration of the COVID-19 declaration under Section 564(b)(1) of the Act, 21 U.S.C. section 360bbb-3(b)(1), unless the authorization is terminated or revoked.     Resp Syncytial Virus by PCR NEGATIVE NEGATIVE Final    Comment: (NOTE) Fact Sheet for Patients: EntrepreneurPulse.com.au  Fact Sheet for Healthcare Providers: IncredibleEmployment.be  This test is not yet approved or cleared by the [place] FDA and has been authorized for detection and/or diagnosis of SARS-CoV-2 by FDA under an Emergency Use Authorization (EUA). This EUA will remain in effect (meaning this test can be used) for the duration of the COVID-19 declaration under Section 564(b)(1) of the Act, 21 U.S.C. section 360bbb-3(b)(1), unless the authorization is terminated or revoked.  Performed at [facility], [place] [address], [place], [place] [address]     Antimicrobials: Anti-infectives (From admission, onward)    Start     Dose/Rate Route Frequency Ordered Stop   07/14/22 1000  oseltamivir (TAMIFLU) capsule 30 mg         30 mg Oral 2 times daily 07/13/22 1633 07/18/22 2159   07/13/22 1700  oseltamivir (TAMIFLU) capsule 75 mg       Note to Pharmacy: Please renally Dose tamiflu   75 mg Oral  Once 07/13/22 1636 07/13/22 1732   07/13/22 1645  oseltamivir (TAMIFLU) capsule 75 mg  Status:  Discontinued       Note to Pharmacy: Please renally Dose tamiflu   75 mg Oral  Once 07/13/22 1630 07/13/22 1633  Culture/Microbiology    Component Value Date/Time   SDES  08/04/2021 1424    BLOOD LEFT ANTECUBITAL Performed at [facility], [address] [place], [place] [address]    [facility]  08/04/2021 1424    Blood Culture adequate volume BOTTLES DRAWN AEROBIC AND ANAEROBIC Performed at [facility], [address] [place], [place] [address]    CULT  08/04/2021 1424    NO GROWTH 5 DAYS Performed at [facility] [address] [place], [place] [address]    REPTSTATUS 08/09/2021 FINAL 08/04/2021 1424  Other culture-see note  Radiology Studies: CT HEAD WO CONTRAST (5MM)  Result Date: 07/13/2022 CLINICAL DATA:  Syncope. EXAM: CT HEAD WITHOUT CONTRAST TECHNIQUE: Contiguous axial images were obtained from the base of the skull through the vertex without intravenous contrast. RADIATION DOSE REDUCTION: This exam was performed according to the departmental dose-optimization program which includes automated exposure control, adjustment of the mA and/or kV according to patient size and/or use of iterative reconstruction technique. COMPARISON:  CT head dated July 02, 2020. FINDINGS: Brain: No evidence of acute infarction, hemorrhage, hydrocephalus, extra-axial collection or mass lesion/mass effect. Stable atrophy and chronic microvascular ischemic changes. Vascular: Calcified atherosclerosis at the skull base. No hyperdense vessel. Skull: Normal. Negative for fracture or focal lesion. Sinuses/Orbits: No acute finding. Other: None. IMPRESSION: 1. No acute intracranial  abnormality. Stable atrophy and chronic microvascular ischemic changes. Electronically Signed   By: Titus Dubin M.D.   On: 07/13/2022 11:03   DG Chest Portable 1 View  Result Date: 07/13/2022 CLINICAL DATA:  Syncopal episode, history dementia EXAM: PORTABLE CHEST 1 VIEW COMPARISON:  Portable exam 0909 hours compared to 08/04/2021 FINDINGS: Normal heart size, mediastinal contours, and pulmonary vascularity. Atherosclerotic calcification aorta. Calcified AP window and RIGHT hilar lymph nodes with a few scattered pulmonary granulomata. Lungs otherwise clear. No infiltrate, pleural effusion, pneumothorax, or acute osseous findings. IMPRESSION: Old granulomatous disease. No acute abnormalities. Aortic Atherosclerosis (ICD10-I70.0). Electronically Signed   By: Lavonia Dana M.D.   On: 07/13/2022 09:17     LOS: 0 days   Antonieta Pert, MD [facility]  07/14/2022, 8:59 AM

## 2022-07-14 NOTE — Discharge Summary (Signed)
Physician Discharge Summary  Brenda Hammond ZLD:[id number] DOB: 11-Jan-1933 DOA: 07/13/2022  PCP: Reymundo Poll, MD  Admit date: 07/13/2022 Discharge date: 07/15/2022 Recommendations for Outpatient Follow-up:  Follow up with PCP in 1 weeks-call for appointment Please obtain BMP/CBC in one week  Discharge Dispo: Memory care/ALF Discharge Condition: Stable Code Status:   Code Status: DNR Diet recommendation:  Diet Order             Diet regular Room service appropriate? Yes; Fluid consistency: Thin  Diet effective now                    Brief/Interim Summary:  89 y.o. female with medical history significant of PAF, GERD, fibromyalgia was at ALF, getting ready, stood up to check in the mirror and passed out. She is a poor historian and pleasantly confused at baseline, but is able to answer some questions. She is alert and oriented to place and person. . She is brushing off the syncopal episodes. She reports some dizziness. She denies any fevers , chills, nausea, vomiting or abdominal pain.  She denies sob, cough, hematemesis or any rectal bleeding. She denies any urinary symptoms.  Some of the history also obtained from EDP.  On arrival to ED, she was afebrile, with soft BP 94/64, labs were unremarkable except for positive influenza A.  She was referred to [facility] for evaluation of syncope and management of dehydration. Orthostatics was done and negative. PT OT eval done and recommended HHPTOT. DAUGHTER wanting to take patient to back to [facility] Once TTE and EEG resulted and if normal. Overall labs are stable. Vitals stable. Echocardiogram although limited no acute finding EEG no evidence of seizure. 1/18 morning after overnight good rest patient is back to baseline alert awake oriented fairly oriented.  She will be discharged back to facility to complete Tamiflu course       Discharge Diagnoses:  Principal Problem:   Syncope Syncope suspect orthostatic hypotension in the setting of  dehydration and influenza infection: OV negative.  Although limited but unremarkable tte and no seizure on eeg.  Vital stable.  Influenza A infection continue Tamiflu> total course To be completed 10 doses starting from 1/16 Paroxysmal atrial fibrillation rate is controlled.  Not on rate controlling medication or anticoagulation at baseline> advised to follow-up with her cardiology GERD stable Slightly elevated blood pressure on admission: but soft today  Consults: PTOT  Subjective: Alert awake resting comfortably having her meal on the bedside chair.  Eager to return to the facility today  Discharge Exam: Vitals:   07/14/22 1553 07/14/22 1744  BP:  (!) 147/67  Pulse:  76  Resp:  18  Temp: 98.4 F (36.9 C) 98.7 F (37.1 C)  SpO2:  100%   General: Pt is alert, awake, not in acute distress Cardiovascular: RRR, S1/S2 +, no rubs, no gallops Respiratory: CTA bilaterally, no wheezing, no rhonchi Abdominal: Soft, NT, ND, bowel sounds + Extremities: no edema, no cyanosis  Discharge Instructions  Discharge Instructions     Discharge instructions   Complete by: As directed    Please call call MD or return to ER for similar or worsening recurring problem that brought you to hospital or if any fever,nausea/vomiting,abdominal pain, uncontrolled pain, chest pain,  shortness of breath or any other alarming symptoms.  Please follow-up your doctor as instructed in a week time and call the office for appointment.  Please avoid alcohol, smoking, or any other illicit substance and maintain healthy habits including taking  your regular medications as prescribed.  You were cared for by a hospitalist during your hospital stay. If you have any questions about your discharge medications or the care you received while you were in the hospital after you are discharged, you can call the unit and ask to speak with the hospitalist on call if the hospitalist that took care of you is not available.  Once  you are discharged, your primary care physician will handle any further medical issues. Please note that NO REFILLS for any discharge medications will be authorized once you are discharged, as it is imperative that you return to your primary care physician (or establish a relationship with a primary care physician if you do not have one) for your aftercare needs so that they can reassess your need for medications and monitor your lab values   Increase activity slowly   Complete by: As directed       Allergies as of 07/15/2022       Reactions   Sulfa Antibiotics Anaphylaxis, Other (See Comments), Hypertension   Elevated HR and BP, also and "ALLERGIC," per [facility]   Alendronate Other (See Comments)   "twitching" and "ALLERGIC," per [facility]   Ampicillin Other (See Comments)   High fever and "ALLERGIC," per [facility]   Aspirin Other (See Comments)   Made the ears ring and "ALLERGIC," per [facility]   Cyclobenzaprine Hcl Other (See Comments)   Nightmares- "ALLERGIC," per [facility]   Metaxalone Other (See Comments)   Nightmares- "ALLERGIC," per [facility]   Nitrofurantoin Other (See Comments), Hypertension   Elevated B/P and chest pain- "ALLERGIC," per [facility]   Sulfonamide Derivatives Other (See Comments), Hypertension   Elevated HR and B/P- "ALLERGIC," per [facility]   Aricept [donepezil] Rash, Other (See Comments)   Dizziness and cramping also- "ALLERGIC," per [facility]   Nortriptyline Hcl Rash, Other (See Comments)   "ALLERGIC," per [facility]   Nsaids Rash, Other (See Comments)   "ALLERGIC," per [facility]   Penicillins Rash, Other (See Comments)   High fever, too- "ALLERGIC," per [facility]        Medication List     TAKE these medications    alendronate 70 MG tablet Commonly known as: FOSAMAX TAKE 1 TABLET(70 MG) BY MOUTH EVERY 7 DAYS WITH A FULL GLASS OF WATER AND ON AN EMPTY STOMACH   bisacodyl 5 MG EC tablet Generic drug: bisacodyl Take 5 mg by mouth daily as needed (for constipation).   lidocaine 2 % solution Commonly known as:  XYLOCAINE Use as directed 1 mL in the mouth or throat See admin instructions. Rinse with 1 ml in the mouth every six hours as needed for sores. Rinse, then spit.   loratadine 10 MG tablet Commonly known as: CLARITIN Take 10 mg by mouth daily as needed for allergies.   oseltamivir 30 MG capsule Commonly known as: TAMIFLU Take 1 capsule (30 mg total) by mouth 2 (two) times daily for 6 doses. Cont till 07/18/22   PSYLLIUM PO Take 0.4 g by mouth at bedtime.   Tylenol 8 Hour Arthritis Pain 650 MG CR tablet Generic drug: acetaminophen Take 650 mg by mouth every 12 (twelve) hours as needed for pain.        Follow-up Information     Reymundo Poll, MD Follow up in 1 week(s).   Specialty: Family Medicine Contact information: [address] [place] [address] [phone]                Allergies  Allergen  Reactions   Sulfa Antibiotics Anaphylaxis, Other (See Comments) and Hypertension    Elevated HR and BP, also and "ALLERGIC," per [facility]   Alendronate Other (See Comments)    "twitching" and "ALLERGIC," per [facility]   Ampicillin Other (See Comments)    High fever and "ALLERGIC," per [facility]   Aspirin Other (See Comments)    Made the ears ring and "ALLERGIC," per [facility]   Cyclobenzaprine Hcl Other (See Comments)    Nightmares- "ALLERGIC," per [facility]   Metaxalone Other (See Comments)    Nightmares- "ALLERGIC," per [facility]   Nitrofurantoin Other (See Comments) and Hypertension    Elevated B/P and chest pain- "ALLERGIC," per [facility]   Sulfonamide Derivatives Other (See Comments) and Hypertension    Elevated HR and B/P- "ALLERGIC," per [facility]   Aricept [Donepezil] Rash and Other (See Comments)    Dizziness and cramping also- "ALLERGIC," per [facility]   Nortriptyline Hcl Rash and Other (See Comments)    "ALLERGIC," per [facility]   Nsaids Rash and Other (See Comments)    "ALLERGIC," per [facility]   Penicillins Rash and Other (See Comments)    High fever, too- "ALLERGIC," per [facility]    The results of  significant diagnostics from this hospitalization (including imaging, microbiology, ancillary and laboratory) are listed below for reference.    Microbiology: Recent Results (from the past 240 hour(s))  Resp panel by RT-PCR (RSV, Flu A&B, Covid) Anterior Nasal Swab     Status: Abnormal   Collection Time: 07/13/22  9:50 AM   Specimen: Anterior Nasal Swab  Result Value Ref Range Status   SARS Coronavirus 2 by RT PCR NEGATIVE NEGATIVE Final    Comment: (NOTE) SARS-CoV-2 target nucleic acids are NOT DETECTED.  The SARS-CoV-2 RNA is generally detectable in upper respiratory specimens during the acute phase of infection. The lowest concentration of SARS-CoV-2 viral copies this assay can detect is 138 copies/mL. A negative result does not preclude SARS-Cov-2 infection and should not be used as the sole basis for treatment or other patient management decisions. A negative result may occur with  improper specimen collection/handling, submission of specimen other than nasopharyngeal swab, presence of viral mutation(s) within the areas targeted by this assay, and inadequate number of viral copies(<138 copies/mL). A negative result must be combined with clinical observations, patient history, and epidemiological information. The expected result is Negative.  Fact Sheet for Patients:  EntrepreneurPulse.com.au  Fact Sheet for Healthcare Providers:  IncredibleEmployment.be  This test is no t yet approved or cleared by the [place] FDA and  has been authorized for detection and/or diagnosis of SARS-CoV-2 by FDA under an Emergency Use Authorization (EUA). This EUA will remain  in effect (meaning this test can be used) for the duration of the COVID-19 declaration under Section 564(b)(1) of the Act, 21 U.S.C.section 360bbb-3(b)(1), unless the authorization is terminated  or revoked sooner.       Influenza A by PCR POSITIVE (A) NEGATIVE Final   Influenza  B by PCR NEGATIVE NEGATIVE Final    Comment: (NOTE) The Xpert Xpress SARS-CoV-2/FLU/RSV plus assay is intended as an aid in the diagnosis of influenza from Nasopharyngeal swab specimens and should not be used as a sole basis for treatment. Nasal washings and aspirates are unacceptable for Xpert Xpress SARS-CoV-2/FLU/RSV testing.  Fact Sheet for Patients: EntrepreneurPulse.com.au  Fact Sheet for Healthcare Providers: IncredibleEmployment.be  This test is not yet approved or cleared by the [place] FDA and has been authorized for detection and/or diagnosis of SARS-CoV-2 by FDA  under an Emergency Use Authorization (EUA). This EUA will remain in effect (meaning this test can be used) for the duration of the COVID-19 declaration under Section 564(b)(1) of the Act, 21 U.S.C. section 360bbb-3(b)(1), unless the authorization is terminated or revoked.     Resp Syncytial Virus by PCR NEGATIVE NEGATIVE Final    Comment: (NOTE) Fact Sheet for Patients: EntrepreneurPulse.com.au  Fact Sheet for Healthcare Providers: IncredibleEmployment.be  This test is not yet approved or cleared by the [place] FDA and has been authorized for detection and/or diagnosis of SARS-CoV-2 by FDA under an Emergency Use Authorization (EUA). This EUA will remain in effect (meaning this test can be used) for the duration of the COVID-19 declaration under Section 564(b)(1) of the Act, 21 U.S.C. section 360bbb-3(b)(1), unless the authorization is terminated or revoked.  Performed at [facility], [place] [address], [place], [place] [address]     Procedures/Studies: EEG adult  Result Date: 07/17/22 Lora Havens, MD     2022/07/17  6:07 PM Patient Name: Brenda Hammond MRN: [id number] Epilepsy Attending: Lora Havens Referring Physician/Provider: Hosie Poisson, MD Date: 17-Jul-2022 Duration: 22.18 mins  Patient history: 89yo f with syncope. EEG to evaluate for seizure. Level of alertness: Awake AEDs during EEG study: None Technical aspects: This EEG study was done with scalp electrodes positioned according to the 10-20 International system of electrode placement. Electrical activity was reviewed with band pass filter of 1-'70Hz'$ , sensitivity of 7 uV/mm, display speed of 51m/sec with a '60Hz'$  notched filter applied as appropriate. EEG data were recorded continuously and digitally stored.  Video monitoring was available and reviewed as appropriate. Description: The posterior dominant rhythm consists of 7 Hz activity of moderate voltage (25-35 uV) seen predominantly in posterior head regions, symmetric and reactive to eye opening and eye closing.  Hyperventilation and photic stimulation were not performed.   ABNORMALITY - Background slow IMPRESSION: This study is suggestive of mild diffuse encephalopathy, nonspecific etiology. No seizures or epileptiform discharges were seen throughout the recording. PLora Havens  ECHOCARDIOGRAM COMPLETE  Result Date: 12024/01/20   ECHOCARDIOGRAM REPORT   [facility] 1[facility]24Medical Rec #:  [id number]              Height:       61.0 in Accession #:    [id number]             Weight:       118.2 lb Date of Birth:  8Sep 04, 1934              BSA:          1.510 m Patient Age:    89years                BP:           132/88 mmHg Patient Gender: F                       HR:           62 bpm. Exam Location:  Inpatient Procedure: 2D Echo, Cardiac Doppler and Color Doppler Indications:    Syncope  History:        Patient has prior history of Echocardiogram examinations, most                 recent 07/04/2020. Angina, Arrythmias:Atrial Fibrillation,  Signs/Symptoms:Fever; Risk Factors:Dyslipidemia.  Sonographer:    Eartha Inch Referring Phys: Theola Sequin IMPRESSIONS  1. Only limited parasternal images obtained as pt refused  to complete exam; LV function normal on parasternal long axis views.  2. Left ventricular ejection fraction, by estimation, is 60 to 65%. The left ventricle has normal function. The left ventricle has no regional wall motion abnormalities. Left ventricular diastolic function could not be evaluated.  3. Right ventricular systolic function was not well visualized. The right ventricular size is not well visualized.  4. The mitral valve is normal in structure. Trivial mitral valve regurgitation. No evidence of mitral stenosis.  5. The aortic valve was not well visualized. Aortic valve regurgitation Not assessed. FINDINGS  Left Ventricle: Left ventricular ejection fraction, by estimation, is 60 to 65%. The left ventricle has normal function. The left ventricle has no regional wall motion abnormalities. The left ventricular internal cavity size was normal in size. There is  no left ventricular hypertrophy. Left ventricular diastolic function could not be evaluated. Right Ventricle: The right ventricular size is not well visualized. Right ventricular systolic function was not well visualized. Left Atrium: Left atrial size was normal in size. Right Atrium: Right atrial size was not assessed. Pericardium: There is no evidence of pericardial effusion. Mitral Valve: The mitral valve is normal in structure. Trivial mitral valve regurgitation. No evidence of mitral valve stenosis. Tricuspid Valve: The tricuspid valve is normal in structure. Tricuspid valve regurgitation is trivial. Aortic Valve: The aortic valve was not well visualized. Aortic valve regurgitation Not assessed. Pulmonic Valve: The pulmonic valve was normal in structure. Pulmonic valve regurgitation is not visualized. No evidence of pulmonic stenosis. Aorta: The aortic root is normal in size and structure. IAS/Shunts: The interatrial septum was not assessed. Additional Comments: Only limited parasternal images obtained as pt refused to complete exam; LV function  normal on parasternal long axis views.  LEFT VENTRICLE PLAX 2D LVIDd:         4.30 cm LVIDs:         2.40 cm LV PW:         0.90 cm LV IVS:        0.90 cm LVOT diam:     2.00 cm LVOT Area:     3.14 cm  LEFT ATRIUM         Index LA diam:    2.90 cm 1.92 cm/m   AORTA Ao Root diam: 2.70 cm Ao Asc diam:  2.90 cm  SHUNTS Systemic Diam: 2.00 cm Kirk Ruths MD Electronically signed by Kirk Ruths MD Signature Date/Time: 07/14/2022/3:35:10 PM    Final    CT HEAD WO CONTRAST (5MM)  Result Date: 07/13/2022 CLINICAL DATA:  Syncope. EXAM: CT HEAD WITHOUT CONTRAST TECHNIQUE: Contiguous axial images were obtained from the base of the skull through the vertex without intravenous contrast. RADIATION DOSE REDUCTION: This exam was performed according to the departmental dose-optimization program which includes automated exposure control, adjustment of the mA and/or kV according to patient size and/or use of iterative reconstruction technique. COMPARISON:  CT head dated July 02, 2020. FINDINGS: Brain: No evidence of acute infarction, hemorrhage, hydrocephalus, extra-axial collection or mass lesion/mass effect. Stable atrophy and chronic microvascular ischemic changes. Vascular: Calcified atherosclerosis at the skull base. No hyperdense vessel. Skull: Normal. Negative for fracture or focal lesion. Sinuses/Orbits: No acute finding. Other: None. IMPRESSION: 1. No acute intracranial abnormality. Stable atrophy and chronic microvascular ischemic changes. Electronically Signed   By: Orville Govern.D.  On: 07/13/2022 11:03   DG Chest Portable 1 View  Result Date: 07/13/2022 CLINICAL DATA:  Syncopal episode, history dementia EXAM: PORTABLE CHEST 1 VIEW COMPARISON:  Portable exam 0909 hours compared to 08/04/2021 FINDINGS: Normal heart size, mediastinal contours, and pulmonary vascularity. Atherosclerotic calcification aorta. Calcified AP window and RIGHT hilar lymph nodes with a few scattered pulmonary granulomata. Lungs  otherwise clear. No infiltrate, pleural effusion, pneumothorax, or acute osseous findings. IMPRESSION: Old granulomatous disease. No acute abnormalities. Aortic Atherosclerosis (ICD10-I70.0). Electronically Signed   By: Lavonia Dana M.D.   On: 07/13/2022 09:17    Labs: BNP (last 3 results) No results for input(s): "BNP" in the last 8760 hours. Basic Metabolic Panel: Recent Labs  Lab 07/13/22 0950 07/14/22 0420 07/14/22 0630 07/15/22 0549  NA 138 138 138 139  K 3.9 3.7 3.5 3.8  CL 108 106 107 106  CO2 '22 26 25 25  '$ GLUCOSE 96 84 87 95  BUN '11 11 10 9  '$ CREATININE 0.86 0.75 0.76 0.80  CALCIUM 8.6* 7.8* 8.0* 8.4*   Liver Function Tests: Recent Labs  Lab 07/13/22 0950 07/14/22 0420  AST 25 22  ALT 12 11  ALKPHOS 97 84  BILITOT 0.4 0.3  PROT 6.6 5.7*  ALBUMIN 3.4* 2.9*   No results for input(s): "LIPASE", "AMYLASE" in the last 168 hours. No results for input(s): "AMMONIA" in the last 168 hours. CBC: Recent Labs  Lab 07/13/22 0950 07/14/22 0420 07/14/22 0556 07/15/22 0549  WBC 5.0 4.4 3.7* 4.7  NEUTROABS 2.9  --  2.1  --   HGB 12.3 11.2* 9.4* 12.7  HCT 40.4 35.4* 30.2* 40.3  MCV 93.3 91.0 94.4 90.2  PLT 179 188 127* 222   Cardiac Enzymes: No results for input(s): "CKTOTAL", "CKMB", "CKMBINDEX", "TROPONINI" in the last 168 hours. BNP: Invalid input(s): "POCBNP" CBG: No results for input(s): "GLUCAP" in the last 168 hours. D-Dimer No results for input(s): "DDIMER" in the last 72 hours. Hgb A1c No results for input(s): "HGBA1C" in the last 72 hours. Lipid Profile No results for input(s): "CHOL", "HDL", "LDLCALC", "TRIG", "CHOLHDL", "LDLDIRECT" in the last 72 hours. Thyroid function studies No results for input(s): "TSH", "T4TOTAL", "T3FREE", "THYROIDAB" in the last 72 hours.  Invalid input(s): "FREET3" Anemia work up No results for input(s): "VITAMINB12", "FOLATE", "FERRITIN", "TIBC", "IRON", "RETICCTPCT" in the last 72 hours. Urinalysis    Component Value  Date/Time   COLORURINE YELLOW 07/13/2022 1256   APPEARANCEUR CLEAR 07/13/2022 1256   LABSPEC 1.019 07/13/2022 1256   PHURINE 5.0 07/13/2022 1256   GLUCOSEU NEGATIVE 07/13/2022 1256   GLUCOSEU NEGATIVE 04/23/2021 1458   HGBUR NEGATIVE 07/13/2022 1256   BILIRUBINUR NEGATIVE 07/13/2022 1256   KETONESUR 5 (A) 07/13/2022 1256   PROTEINUR NEGATIVE 07/13/2022 1256   UROBILINOGEN 0.2 04/23/2021 1458   NITRITE NEGATIVE 07/13/2022 1256   LEUKOCYTESUR NEGATIVE 07/13/2022 1256   Sepsis Labs Recent Labs  Lab 07/13/22 0950 07/14/22 0420 07/14/22 0556 07/15/22 0549  WBC 5.0 4.4 3.7* 4.7   Microbiology Recent Results (from the past 240 hour(s))  Resp panel by RT-PCR (RSV, Flu A&B, Covid) Anterior Nasal Swab     Status: Abnormal   Collection Time: 07/13/22  9:50 AM   Specimen: Anterior Nasal Swab  Result Value Ref Range Status   SARS Coronavirus 2 by RT PCR NEGATIVE NEGATIVE Final    Comment: (NOTE) SARS-CoV-2 target nucleic acids are NOT DETECTED.  The SARS-CoV-2 RNA is generally detectable in upper respiratory specimens during the acute phase of infection. The  lowest concentration of SARS-CoV-2 viral copies this assay can detect is 138 copies/mL. A negative result does not preclude SARS-Cov-2 infection and should not be used as the sole basis for treatment or other patient management decisions. A negative result may occur with  improper specimen collection/handling, submission of specimen other than nasopharyngeal swab, presence of viral mutation(s) within the areas targeted by this assay, and inadequate number of viral copies(<138 copies/mL). A negative result must be combined with clinical observations, patient history, and epidemiological information. The expected result is Negative.  Fact Sheet for Patients:  EntrepreneurPulse.com.au  Fact Sheet for Healthcare Providers:  IncredibleEmployment.be  This test is no t yet approved or cleared by  the [place] FDA and  has been authorized for detection and/or diagnosis of SARS-CoV-2 by FDA under an Emergency Use Authorization (EUA). This EUA will remain  in effect (meaning this test can be used) for the duration of the COVID-19 declaration under Section 564(b)(1) of the Act, 21 U.S.C.section 360bbb-3(b)(1), unless the authorization is terminated  or revoked sooner.       Influenza A by PCR POSITIVE (A) NEGATIVE Final   Influenza B by PCR NEGATIVE NEGATIVE Final    Comment: (NOTE) The Xpert Xpress SARS-CoV-2/FLU/RSV plus assay is intended as an aid in the diagnosis of influenza from Nasopharyngeal swab specimens and should not be used as a sole basis for treatment. Nasal washings and aspirates are unacceptable for Xpert Xpress SARS-CoV-2/FLU/RSV testing.  Fact Sheet for Patients: EntrepreneurPulse.com.au  Fact Sheet for Healthcare Providers: IncredibleEmployment.be  This test is not yet approved or cleared by the [place] FDA and has been authorized for detection and/or diagnosis of SARS-CoV-2 by FDA under an Emergency Use Authorization (EUA). This EUA will remain in effect (meaning this test can be used) for the duration of the COVID-19 declaration under Section 564(b)(1) of the Act, 21 U.S.C. section 360bbb-3(b)(1), unless the authorization is terminated or revoked.     Resp Syncytial Virus by PCR NEGATIVE NEGATIVE Final    Comment: (NOTE) Fact Sheet for Patients: EntrepreneurPulse.com.au  Fact Sheet for Healthcare Providers: IncredibleEmployment.be  This test is not yet approved or cleared by the [place] FDA and has been authorized for detection and/or diagnosis of SARS-CoV-2 by FDA under an Emergency Use Authorization (EUA). This EUA will remain in effect (meaning this test can be used) for the duration of the COVID-19 declaration under Section 564(b)(1) of the Act, 21  U.S.C. section 360bbb-3(b)(1), unless the authorization is terminated or revoked.  Performed at [facility], [place] [address], [place], [place] [address]   Time coordinating discharge: 25 minutes  SIGNED: Antonieta Pert, MD  [facility] 07/15/2022, 10:21 AM  If 7PM-7AM, please contact night-coverage [email or web address]

## 2022-07-14 NOTE — ED Notes (Signed)
ED TO INPATIENT HANDOFF REPORT  ED Nurse Name and Phone #: Alroy Bailiff Name/Age/Gender Brenda Hammond 89 y.o. female Room/Bed: WHALC/WHALC  Code Status   Code Status: DNR  Home/SNF/Other Rehab Patient oriented to: self and place Is this baseline? Yes   Triage Complete: Triage complete  Chief Complaint Syncope [R55]  Triage Note Pt bib [facility]. Pt was standing by her bed getting ready for breakfast she stopped talking then lay back on her bed. Pt did not fall or hit her head. After one min pt reterned to baseline. Pt has pmh of dimentia. Denies pain and has no complaints at this time. Pt from [facility] care.    BP 112/50  Hr 60 Rr 18  Spo2 98%  CBG 144   Allergies Allergies  Allergen Reactions   Sulfa Antibiotics Anaphylaxis    REACTION: elevated HR and BP   Alendronate Other (See Comments)    "twitching" "twitching"   Ampicillin     REACTION: rash and high fever   Aspirin     REACTION: ears ringing   Cyclobenzaprine Hcl     REACTION: nightmares   Metaxalone     REACTION: nightmares   Nitrofurantoin     REACTION: elevated BP and chest pain   Nsaids     REACTION: rash   Penicillins     REACTION: rash, high fever   Sulfonamide Derivatives     REACTION: elevated HR and BP   Aricept [Donepezil] Rash    Dizziness and cramping   Nortriptyline Hcl Rash    Level of Care/Admitting Diagnosis ED Disposition     ED Disposition  Admit   Condition  --   Comment  Hospital Area: [place] [[id number]]  Level of Care: Telemetry [5]  Admit to tele based on following criteria: Eval of Syncope  May place patient in observation at [facility] or [place] if equivalent level of care is available:: Yes  Covid Evaluation: Confirmed COVID Negative  Diagnosis: Syncope [206001]  Admitting Physician: L[place] Sheldrake, Valeria  Attending Physician: Hosie Poisson [[id number]]          B Medical/Surgery History Past Medical History:   Diagnosis Date   Anemia    Anxiety    Atrial fibrillation ([place])    Colon adenocarcinoma ([place])    DJD (degenerative joint disease)    Fibromyalgia    GERD (gastroesophageal reflux disease)    History of poliomyelitis    History of UTI    Hyperlipemia    Memory disorder 03/01/2019   Other drug allergy(995.27)    Scoliosis    Surgical or other procedure not carried out because of patient's decision    Venous insufficiency    Past Surgical History:  Procedure Laterality Date   APPENDECTOMY     LOW ANTERIOR BOWEL RESECTION  06/2009   with anastomosis for colon cancer   OTHER SURGICAL HISTORY     T & A   TOTAL ABDOMINAL HYSTERECTOMY W/ BILATERAL SALPINGOOPHORECTOMY  1980s     A IV Location/Drains/Wounds Patient Lines/Drains/Airways Status     Active Line/Drains/Airways     None            Intake/Output Last 24 hours  Intake/Output Summary (Last 24 hours) at 07/14/2022 1552 Last data filed at 07/13/2022 1733 Gross per 24 hour  Intake 1000 ml  Output --  Net 1000 ml    Labs/Imaging Results for orders placed or performed during the hospital encounter of 07/13/22 (from the  past 48 hour(s))  CBC with Differential     Status: None   Collection Time: 07/13/22  9:50 AM  Result Value Ref Range   WBC 5.0 4.0 - 10.5 K/uL   RBC 4.33 3.87 - 5.11 MIL/uL   Hemoglobin 12.3 12.0 - 15.0 g/dL   HCT 40.4 36.0 - 46.0 %   MCV 93.3 80.0 - 100.0 fL   MCH 28.4 26.0 - 34.0 pg   MCHC 30.4 30.0 - 36.0 g/dL   RDW 13.2 11.5 - 15.5 %   Platelets 179 150 - 400 K/uL   nRBC 0.0 0.0 - 0.2 %   Neutrophils Relative % 58 %   Neutro Abs 2.9 1.7 - 7.7 K/uL   Lymphocytes Relative 17 %   Lymphs Abs 0.9 0.7 - 4.0 K/uL   Monocytes Relative 18 %   Monocytes Absolute 0.9 0.1 - 1.0 K/uL   Eosinophils Relative 6 %   Eosinophils Absolute 0.3 0.0 - 0.5 K/uL   Basophils Relative 1 %   Basophils Absolute 0.0 0.0 - 0.1 K/uL   Immature Granulocytes 0 %   Abs Immature Granulocytes 0.01 0.00 - 0.07 K/uL     Comment: Performed at [facility], [place] [address], [place], [place] [address]  Comprehensive metabolic panel     Status: Abnormal   Collection Time: 07/13/22  9:50 AM  Result Value Ref Range   Sodium 138 135 - 145 mmol/L   Potassium 3.9 3.5 - 5.1 mmol/L   Chloride 108 98 - 111 mmol/L   CO2 22 22 - 32 mmol/L   Glucose, Bld 96 70 - 99 mg/dL    Comment: Glucose reference range applies only to samples taken after fasting for at least 8 hours.   BUN 11 8 - 23 mg/dL   Creatinine, Ser 0.86 0.44 - 1.00 mg/dL   Calcium 8.6 (L) 8.9 - 10.3 mg/dL   Total Protein 6.6 6.5 - 8.1 g/dL   Albumin 3.4 (L) 3.5 - 5.0 g/dL   AST 25 15 - 41 U/L   ALT 12 0 - 44 U/L   Alkaline Phosphatase 97 38 - 126 U/L   Total Bilirubin 0.4 0.3 - 1.2 mg/dL   GFR, Estimated >60 >60 mL/min    Comment: (NOTE) Calculated using the CKD-EPI Creatinine Equation (2021)    Anion gap 8 5 - 15    Comment: Performed at [facility], [place] [address] [place], [place] [address]  Lactic acid, plasma     Status: None   Collection Time: 07/13/22  9:50 AM  Result Value Ref Range   Lactic Acid, Venous 1.3 0.5 - 1.9 mmol/L    Comment: Performed at [facility], [place] [address] [place], [place] [address]  Resp panel by RT-PCR (RSV, Flu A&B, Covid) Anterior Nasal Swab     Status: Abnormal   Collection Time: 07/13/22  9:50 AM   Specimen: Anterior Nasal Swab  Result Value Ref Range   SARS Coronavirus 2 by RT PCR NEGATIVE NEGATIVE    Comment: (NOTE) SARS-CoV-2 target nucleic acids are NOT DETECTED.  The SARS-CoV-2 RNA is generally detectable in upper respiratory specimens during the acute phase of infection. The lowest concentration of SARS-CoV-2 viral copies this assay can detect is 138 copies/mL. A negative result does not preclude SARS-Cov-2 infection and should not be used as the sole basis for treatment or other patient management decisions. A negative result may occur  with  improper specimen collection/handling, submission of specimen  other than nasopharyngeal swab, presence of viral mutation(s) within the areas targeted by this assay, and inadequate number of viral copies(<138 copies/mL). A negative result must be combined with clinical observations, patient history, and epidemiological information. The expected result is Negative.  Fact Sheet for Patients:  EntrepreneurPulse.com.au  Fact Sheet for Healthcare Providers:  IncredibleEmployment.be  This test is no t yet approved or cleared by the [place] FDA and  has been authorized for detection and/or diagnosis of SARS-CoV-2 by FDA under an Emergency Use Authorization (EUA). This EUA will remain  in effect (meaning this test can be used) for the duration of the COVID-19 declaration under Section 564(b)(1) of the Act, 21 U.S.C.section 360bbb-3(b)(1), unless the authorization is terminated  or revoked sooner.       Influenza A by PCR POSITIVE (A) NEGATIVE   Influenza B by PCR NEGATIVE NEGATIVE    Comment: (NOTE) The Xpert Xpress SARS-CoV-2/FLU/RSV plus assay is intended as an aid in the diagnosis of influenza from Nasopharyngeal swab specimens and should not be used as a sole basis for treatment. Nasal washings and aspirates are unacceptable for Xpert Xpress SARS-CoV-2/FLU/RSV testing.  Fact Sheet for Patients: EntrepreneurPulse.com.au  Fact Sheet for Healthcare Providers: IncredibleEmployment.be  This test is not yet approved or cleared by the [place] FDA and has been authorized for detection and/or diagnosis of SARS-CoV-2 by FDA under an Emergency Use Authorization (EUA). This EUA will remain in effect (meaning this test can be used) for the duration of the COVID-19 declaration under Section 564(b)(1) of the Act, 21 U.S.C. section 360bbb-3(b)(1), unless the authorization is terminated or revoked.      Resp Syncytial Virus by PCR NEGATIVE NEGATIVE    Comment: (NOTE) Fact Sheet for Patients: EntrepreneurPulse.com.au  Fact Sheet for Healthcare Providers: IncredibleEmployment.be  This test is not yet approved or cleared by the [place] FDA and has been authorized for detection and/or diagnosis of SARS-CoV-2 by FDA under an Emergency Use Authorization (EUA). This EUA will remain in effect (meaning this test can be used) for the duration of the COVID-19 declaration under Section 564(b)(1) of the Act, 21 U.S.C. section 360bbb-3(b)(1), unless the authorization is terminated or revoked.  Performed at [facility], [place] [address], [place], [place] [address]   Urinalysis, Routine w reflex microscopic Urine, In & Out Cath     Status: Abnormal   Collection Time: 07/13/22 12:56 PM  Result Value Ref Range   Color, Urine YELLOW YELLOW   APPearance CLEAR CLEAR   Specific Gravity, Urine 1.019 1.005 - 1.030   pH 5.0 5.0 - 8.0   Glucose, UA NEGATIVE NEGATIVE mg/dL   Hgb urine dipstick NEGATIVE NEGATIVE   Bilirubin Urine NEGATIVE NEGATIVE   Ketones, ur 5 (A) NEGATIVE mg/dL   Protein, ur NEGATIVE NEGATIVE mg/dL   Nitrite NEGATIVE NEGATIVE   Leukocytes,Ua NEGATIVE NEGATIVE    Comment: Performed at [facility] [address], [place], [place] [address]  Comprehensive metabolic panel     Status: Abnormal   Collection Time: 07/14/22  4:20 AM  Result Value Ref Range   Sodium 138 135 - 145 mmol/L   Potassium 3.7 3.5 - 5.1 mmol/L   Chloride 106 98 - 111 mmol/L   CO2 26 22 - 32 mmol/L   Glucose, Bld 84 70 - 99 mg/dL    Comment: Glucose reference range applies only to samples taken after fasting for at least 8 hours.   BUN 11 8 - 23 mg/dL  Creatinine, Ser 0.75 0.44 - 1.00 mg/dL   Calcium 7.8 (L) 8.9 - 10.3 mg/dL   Total Protein 5.7 (L) 6.5 - 8.1 g/dL   Albumin 2.9 (L) 3.5 - 5.0 g/dL   AST 22 15 - 41 U/L   ALT 11 0 -  44 U/L   Alkaline Phosphatase 84 38 - 126 U/L   Total Bilirubin 0.3 0.3 - 1.2 mg/dL   GFR, Estimated >60 >60 mL/min    Comment: (NOTE) Calculated using the CKD-EPI Creatinine Equation (2021)    Anion gap 6 5 - 15    Comment: Performed at [facility], [place] [address] [place], [place] [address]  CBC     Status: Abnormal   Collection Time: 07/14/22  4:20 AM  Result Value Ref Range   WBC 4.4 4.0 - 10.5 K/uL   RBC 3.89 3.87 - 5.11 MIL/uL   Hemoglobin 11.2 (L) 12.0 - 15.0 g/dL   HCT 35.4 (L) 36.0 - 46.0 %   MCV 91.0 80.0 - 100.0 fL   MCH 28.8 26.0 - 34.0 pg   MCHC 31.6 30.0 - 36.0 g/dL   RDW 13.2 11.5 - 15.5 %   Platelets 188 150 - 400 K/uL   nRBC 0.0 0.0 - 0.2 %    Comment: Performed at [facility], [place] [address], [place], [place] [address]  Protime-INR     Status: None   Collection Time: 07/14/22  4:20 AM  Result Value Ref Range   Prothrombin Time 13.8 11.4 - 15.2 seconds   INR 1.1 0.8 - 1.2    Comment: (NOTE) INR goal varies based on device and disease states. Performed at [facility], [place] [address], [place], [place] [address]   CBC with Differential/Platelet     Status: Abnormal   Collection Time: 07/14/22  5:56 AM  Result Value Ref Range   WBC 3.7 (L) 4.0 - 10.5 K/uL   RBC 3.20 (L) 3.87 - 5.11 MIL/uL   Hemoglobin 9.4 (L) 12.0 - 15.0 g/dL   HCT 30.2 (L) 36.0 - 46.0 %   MCV 94.4 80.0 - 100.0 fL   MCH 29.4 26.0 - 34.0 pg   MCHC 31.1 30.0 - 36.0 g/dL   RDW 13.2 11.5 - 15.5 %   Platelets 127 (L) 150 - 400 K/uL    Comment: SPECIMEN CHECKED FOR CLOTS REPEATED TO VERIFY    nRBC 0.0 0.0 - 0.2 %   Neutrophils Relative % 55 %   Neutro Abs 2.1 1.7 - 7.7 K/uL   Lymphocytes Relative 20 %   Lymphs Abs 0.8 0.7 - 4.0 K/uL   Monocytes Relative 16 %   Monocytes Absolute 0.6 0.1 - 1.0 K/uL   Eosinophils Relative 8 %   Eosinophils Absolute 0.3 0.0 - 0.5 K/uL   Basophils Relative 1 %   Basophils Absolute 0.0 0.0 - 0.1  K/uL   Immature Granulocytes 0 %   Abs Immature Granulocytes 0.01 0.00 - 0.07 K/uL    Comment: Performed at [facility], [place] [address], [place], [place] [address]  Basic metabolic panel     Status: Abnormal   Collection Time: 07/14/22  6:30 AM  Result Value Ref Range   Sodium 138 135 - 145 mmol/L   Potassium 3.5 3.5 - 5.1 mmol/L   Chloride 107 98 - 111 mmol/L   CO2 25 22 - 32 mmol/L   Glucose, Bld 87 70 - 99 mg/dL    Comment: Glucose reference  range applies only to samples taken after fasting for at least 8 hours.   BUN 10 8 - 23 mg/dL   Creatinine, Ser 0.76 0.44 - 1.00 mg/dL   Calcium 8.0 (L) 8.9 - 10.3 mg/dL   GFR, Estimated >60 >60 mL/min    Comment: (NOTE) Calculated using the CKD-EPI Creatinine Equation (2021)    Anion gap 6 5 - 15    Comment: Performed at [facility], [place] [address] [place], [place] [address]   ECHOCARDIOGRAM COMPLETE  Result Date: 07/14/2022    ECHOCARDIOGRAM REPORT   Patient Name:   Brenda Hammond [facility] Date of Exam: 07/14/2022 Medical Rec #:  [id number]               Height:       61.0 in Accession #:    [id number]              Weight:       118.2 lb Date of Birth:  14-Mar-1933               BSA:          1.510 m Patient Age:    19 years                BP:           132/88 mmHg Patient Gender: F                       HR:           62 bpm. Exam Location:  Inpatient Procedure: 2D Echo, Cardiac Doppler and Color Doppler Indications:    Syncope  History:        Patient has prior history of Echocardiogram examinations, most                 recent 07/04/2020. Angina, Arrythmias:Atrial Fibrillation,                 Signs/Symptoms:Fever; Risk Factors:Dyslipidemia.  Sonographer:    Eartha Inch Referring Phys: Theola Sequin IMPRESSIONS  1. Only limited parasternal images obtained as pt refused to complete exam; LV function normal on parasternal long axis views.  2. Left ventricular ejection fraction, by estimation, is 60 to 65%.  The left ventricle has normal function. The left ventricle has no regional wall motion abnormalities. Left ventricular diastolic function could not be evaluated.  3. Right ventricular systolic function was not well visualized. The right ventricular size is not well visualized.  4. The mitral valve is normal in structure. Trivial mitral valve regurgitation. No evidence of mitral stenosis.  5. The aortic valve was not well visualized. Aortic valve regurgitation Not assessed. FINDINGS  Left Ventricle: Left ventricular ejection fraction, by estimation, is 60 to 65%. The left ventricle has normal function. The left ventricle has no regional wall motion abnormalities. The left ventricular internal cavity size was normal in size. There is  no left ventricular hypertrophy. Left ventricular diastolic function could not be evaluated. Right Ventricle: The right ventricular size is not well visualized. Right ventricular systolic function was not well visualized. Left Atrium: Left atrial size was normal in size. Right Atrium: Right atrial size was not assessed. Pericardium: There is no evidence of pericardial effusion. Mitral Valve: The mitral valve is normal in structure. Trivial mitral valve regurgitation. No evidence of mitral valve stenosis. Tricuspid Valve: The tricuspid valve is normal in structure. Tricuspid valve regurgitation is trivial. Aortic Valve: The aortic valve was  not well visualized. Aortic valve regurgitation Not assessed. Pulmonic Valve: The pulmonic valve was normal in structure. Pulmonic valve regurgitation is not visualized. No evidence of pulmonic stenosis. Aorta: The aortic root is normal in size and structure. IAS/Shunts: The interatrial septum was not assessed. Additional Comments: Only limited parasternal images obtained as pt refused to complete exam; LV function normal on parasternal long axis views.  LEFT VENTRICLE PLAX 2D LVIDd:         4.30 cm LVIDs:         2.40 cm LV PW:         0.90 cm LV IVS:         0.90 cm LVOT diam:     2.00 cm LVOT Area:     3.14 cm  LEFT ATRIUM         Index LA diam:    2.90 cm 1.92 cm/m   AORTA Ao Root diam: 2.70 cm Ao Asc diam:  2.90 cm  SHUNTS Systemic Diam: 2.00 cm Kirk Ruths MD Electronically signed by Kirk Ruths MD Signature Date/Time: 07/14/2022/3:35:10 PM    Final    CT HEAD WO CONTRAST (5MM)  Result Date: 07/13/2022 CLINICAL DATA:  Syncope. EXAM: CT HEAD WITHOUT CONTRAST TECHNIQUE: Contiguous axial images were obtained from the base of the skull through the vertex without intravenous contrast. RADIATION DOSE REDUCTION: This exam was performed according to the departmental dose-optimization program which includes automated exposure control, adjustment of the mA and/or kV according to patient size and/or use of iterative reconstruction technique. COMPARISON:  CT head dated July 02, 2020. FINDINGS: Brain: No evidence of acute infarction, hemorrhage, hydrocephalus, extra-axial collection or mass lesion/mass effect. Stable atrophy and chronic microvascular ischemic changes. Vascular: Calcified atherosclerosis at the skull base. No hyperdense vessel. Skull: Normal. Negative for fracture or focal lesion. Sinuses/Orbits: No acute finding. Other: None. IMPRESSION: 1. No acute intracranial abnormality. Stable atrophy and chronic microvascular ischemic changes. Electronically Signed   By: Titus Dubin M.D.   On: 07/13/2022 11:03   DG Chest Portable 1 View  Result Date: 07/13/2022 CLINICAL DATA:  Syncopal episode, history dementia EXAM: PORTABLE CHEST 1 VIEW COMPARISON:  Portable exam 0909 hours compared to 08/04/2021 FINDINGS: Normal heart size, mediastinal contours, and pulmonary vascularity. Atherosclerotic calcification aorta. Calcified AP window and RIGHT hilar lymph nodes with a few scattered pulmonary granulomata. Lungs otherwise clear. No infiltrate, pleural effusion, pneumothorax, or acute osseous findings. IMPRESSION: Old granulomatous disease. No acute  abnormalities. Aortic Atherosclerosis (ICD10-I70.0). Electronically Signed   By: Lavonia Dana M.D.   On: 07/13/2022 09:17    Pending Labs Unresulted Labs (From admission, onward)     Start     Ordered   07/20/22 0500  Creatinine, serum  (enoxaparin (LOVENOX)    CrCl >/= 30 ml/min)  Weekly,   R     Comments: while on enoxaparin therapy    07/13/22 2223   07/15/22 9562  Basic metabolic panel  Daily,   R      07/14/22 1022   07/15/22 0500  CBC  Daily,   R      07/14/22 1022            Vitals/Pain Today's Vitals   07/14/22 1005 07/14/22 1031 07/14/22 1048 07/14/22 1515  BP:   127/65 132/88  Pulse:   65 (!) 58  Resp:   14 18  Temp: 98 F (36.7 C)     TempSrc: Oral     SpO2:   99% 100%  Weight:  Height:      PainSc:  0-No pain      Isolation Precautions No active isolations  Medications Medications  sodium chloride 0.9 % bolus 500 mL (500 mLs Intravenous Not Given 07/13/22 1120)  0.9 %  sodium chloride infusion ( Intravenous Rate/Dose Verify 07/14/22 0045)  enoxaparin (LOVENOX) injection 40 mg (40 mg Subcutaneous Given 07/14/22 0832)  acetaminophen (TYLENOL) tablet 650 mg (650 mg Oral Given 07/14/22 0653)    Or  acetaminophen (TYLENOL) suppository 650 mg ( Rectal See Alternative 07/14/22 0653)  ondansetron (ZOFRAN) tablet 4 mg (has no administration in time range)    Or  ondansetron (ZOFRAN) injection 4 mg (has no administration in time range)  oseltamivir (TAMIFLU) capsule 30 mg (30 mg Oral Given 07/14/22 0831)  acetaminophen (TYLENOL) tablet 650 mg (650 mg Oral Given 07/13/22 1131)  sodium chloride 0.9 % bolus 1,000 mL (0 mLs Intravenous Stopped 07/13/22 1733)  oseltamivir (TAMIFLU) capsule 75 mg (75 mg Oral Given 07/13/22 1732)  OLANZapine (ZYPREXA) injection 2 mg (2 mg Intramuscular Given 07/14/22 0420)  sterile water (preservative free) injection (2.1 mLs  Given 07/14/22 0420)    Mobility walks with person assist     Focused Assessments    R Recommendations:  See Admitting Provider Note  Report given to:   Additional Notes: getting her EEG done now

## 2022-07-14 NOTE — ED Notes (Signed)
Pt has been repeatedly pulling at IV's, monitoring cables, removed 5-lead.

## 2022-07-14 NOTE — Progress Notes (Signed)
Patient in hallway in ED no rooms available in ED to do EEG now per RN

## 2022-07-14 NOTE — Progress Notes (Signed)
EEG complete - results pending 

## 2022-07-15 ENCOUNTER — Other Ambulatory Visit (HOSPITAL_COMMUNITY): Payer: Self-pay

## 2022-07-15 DIAGNOSIS — R55 Syncope and collapse: Secondary | ICD-10-CM | POA: Diagnosis not present

## 2022-07-15 LAB — BASIC METABOLIC PANEL
Anion gap: 8 (ref 5–15)
BUN: 9 mg/dL (ref 8–23)
CO2: 25 mmol/L (ref 22–32)
Calcium: 8.4 mg/dL — ABNORMAL LOW (ref 8.9–10.3)
Chloride: 106 mmol/L (ref 98–111)
Creatinine, Ser: 0.8 mg/dL (ref 0.44–1.00)
GFR, Estimated: >60 mL/min (ref >60)
Glucose, Bld: 95 mg/dL (ref 70–99)
Potassium: 3.8 mmol/L (ref 3.5–5.1)
Sodium: 139 mmol/L (ref 135–145)

## 2022-07-15 LAB — CBC
HCT: 40.3 % (ref 36.0–46.0)
Hemoglobin: 12.7 g/dL (ref 12.0–15.0)
MCH: 28.4 pg (ref 26.0–34.0)
MCHC: 31.5 g/dL (ref 30.0–36.0)
MCV: 90.2 fL (ref 80.0–100.0)
Platelets: 222 10*3/uL (ref 150–400)
RBC: 4.47 MIL/uL (ref 3.87–5.11)
RDW: 13.2 % (ref 11.5–15.5)
WBC: 4.7 10*3/uL (ref 4.0–10.5)
nRBC: 0 % (ref 0.0–0.2)

## 2022-07-15 MED ORDER — OSELTAMIVIR PHOSPHATE 30 MG PO CAPS: 30.0000 mg | ORAL_CAPSULE | Freq: Two times a day (BID) | ORAL | 0 refills | Status: AC

## 2022-07-26 DIAGNOSIS — J302 Other seasonal allergic rhinitis: Secondary | ICD-10-CM | POA: Diagnosis not present

## 2022-07-26 DIAGNOSIS — G301 Alzheimer's disease with late onset: Secondary | ICD-10-CM | POA: Diagnosis not present

## 2022-07-26 DIAGNOSIS — K5901 Slow transit constipation: Secondary | ICD-10-CM | POA: Diagnosis not present

## 2022-07-26 DIAGNOSIS — M150 Primary generalized (osteo)arthritis: Secondary | ICD-10-CM | POA: Diagnosis not present

## 2022-08-17 ENCOUNTER — Emergency Department (HOSPITAL_COMMUNITY): Payer: Medicare Other

## 2022-08-17 ENCOUNTER — Emergency Department (HOSPITAL_COMMUNITY)
Admission: EM | Admit: 2022-08-17 | Discharge: 2022-08-17 | Disposition: A | Discharge status: Home / Self Care | Payer: Medicare Other | Attending: Emergency Medicine | Admitting: Emergency Medicine

## 2022-08-17 ENCOUNTER — Other Ambulatory Visit: Payer: Self-pay

## 2022-08-17 DIAGNOSIS — F0390 Unspecified dementia without behavioral disturbance: Secondary | ICD-10-CM | POA: Insufficient documentation

## 2022-08-17 DIAGNOSIS — S199XXA Unspecified injury of neck, initial encounter: Secondary | ICD-10-CM | POA: Diagnosis not present

## 2022-08-17 DIAGNOSIS — M25552 Pain in left hip: Secondary | ICD-10-CM | POA: Diagnosis not present

## 2022-08-17 DIAGNOSIS — W1830XA Fall on same level, unspecified, initial encounter: Secondary | ICD-10-CM | POA: Diagnosis not present

## 2022-08-17 DIAGNOSIS — I6521 Occlusion and stenosis of right carotid artery: Secondary | ICD-10-CM | POA: Diagnosis not present

## 2022-08-17 DIAGNOSIS — Z043 Encounter for examination and observation following other accident: Secondary | ICD-10-CM | POA: Diagnosis not present

## 2022-08-17 DIAGNOSIS — R9431 Abnormal electrocardiogram [ECG] [EKG]: Secondary | ICD-10-CM | POA: Diagnosis not present

## 2022-08-17 DIAGNOSIS — S0083XA Contusion of other part of head, initial encounter: Secondary | ICD-10-CM | POA: Insufficient documentation

## 2022-08-17 DIAGNOSIS — G319 Degenerative disease of nervous system, unspecified: Secondary | ICD-10-CM | POA: Diagnosis not present

## 2022-08-17 DIAGNOSIS — J984 Other disorders of lung: Secondary | ICD-10-CM | POA: Diagnosis not present

## 2022-08-17 DIAGNOSIS — W19XXXA Unspecified fall, initial encounter: Secondary | ICD-10-CM | POA: Diagnosis not present

## 2022-08-17 DIAGNOSIS — S0003XA Contusion of scalp, initial encounter: Secondary | ICD-10-CM | POA: Diagnosis not present

## 2022-08-17 DIAGNOSIS — R6889 Other general symptoms and signs: Secondary | ICD-10-CM | POA: Diagnosis not present

## 2022-08-17 DIAGNOSIS — Z743 Need for continuous supervision: Secondary | ICD-10-CM | POA: Diagnosis not present

## 2022-08-17 DIAGNOSIS — M5030 Other cervical disc degeneration, unspecified cervical region: Secondary | ICD-10-CM | POA: Diagnosis not present

## 2022-08-17 DIAGNOSIS — S0990XA Unspecified injury of head, initial encounter: Secondary | ICD-10-CM | POA: Diagnosis not present

## 2022-08-17 LAB — CBC WITH DIFFERENTIAL/PLATELET
Abs Immature Granulocytes: 0.03 10*3/uL (ref 0.00–0.07)
Basophils Absolute: 0 10*3/uL (ref 0.0–0.1)
Basophils Relative: 0 %
Eosinophils Absolute: 0.3 10*3/uL (ref 0.0–0.5)
Eosinophils Relative: 3 %
HCT: 39.1 % (ref 36.0–46.0)
Hemoglobin: 12.3 g/dL (ref 12.0–15.0)
Immature Granulocytes: 0 %
Lymphocytes Relative: 6 %
Lymphs Abs: 0.6 10*3/uL — ABNORMAL LOW (ref 0.7–4.0)
MCH: 28.5 pg (ref 26.0–34.0)
MCHC: 31.5 g/dL (ref 30.0–36.0)
MCV: 90.5 fL (ref 80.0–100.0)
Monocytes Absolute: 0.7 10*3/uL (ref 0.1–1.0)
Monocytes Relative: 7 %
Neutro Abs: 8.6 10*3/uL — ABNORMAL HIGH (ref 1.7–7.7)
Neutrophils Relative %: 84 %
Platelets: 220 10*3/uL (ref 150–400)
RBC: 4.32 MIL/uL (ref 3.87–5.11)
RDW: 13.2 % (ref 11.5–15.5)
WBC: 10.2 10*3/uL (ref 4.0–10.5)
nRBC: 0 % (ref 0.0–0.2)

## 2022-08-17 LAB — COMPREHENSIVE METABOLIC PANEL
ALT: 18 U/L (ref 0–44)
AST: 26 U/L (ref 15–41)
Albumin: 3.5 g/dL (ref 3.5–5.0)
Alkaline Phosphatase: 96 U/L (ref 38–126)
Anion gap: 8 (ref 5–15)
BUN: 15 mg/dL (ref 8–23)
CO2: 25 mmol/L (ref 22–32)
Calcium: 8.3 mg/dL — ABNORMAL LOW (ref 8.9–10.3)
Chloride: 104 mmol/L (ref 98–111)
Creatinine, Ser: 0.81 mg/dL (ref 0.44–1.00)
GFR, Estimated: >60 mL/min (ref >60)
Glucose, Bld: 114 mg/dL — ABNORMAL HIGH (ref 70–99)
Potassium: 4.2 mmol/L (ref 3.5–5.1)
Sodium: 137 mmol/L (ref 135–145)
Total Bilirubin: 0.8 mg/dL (ref 0.3–1.2)
Total Protein: 6.8 g/dL (ref 6.5–8.1)

## 2022-08-17 LAB — TROPONIN I (HIGH SENSITIVITY)
Troponin I (High Sensitivity): 4 ng/L (ref <18)
Troponin I (High Sensitivity): 5 ng/L (ref <18)

## 2022-08-17 MED ORDER — SODIUM CHLORIDE 0.9 % IV BOLUS
1000.0000 mL | Freq: Once | INTRAVENOUS | Status: AC
  Administered 2022-08-17: 1000 mL via INTRAVENOUS

## 2022-08-17 NOTE — ED Provider Notes (Signed)
[place] EMERGENCY DEPARTMENT AT [facility] Provider Note   CSN: [id number] Arrival date & time: 08/17/22  [id number]     History  Chief Complaint  Patient presents with   Fall   Head Injury    Brenda Hammond is a 89 y.o. female.  Patient had a fall today.  She hit her head.  Patient has a history of dementia.  The history is provided by the nursing home and a relative. No language interpreter was used.  Fall This is a new problem. The current episode started 12 to 24 hours ago. The problem occurs constantly. Pertinent negatives include no chest pain. Nothing aggravates the symptoms. She has tried nothing for the symptoms. The treatment provided mild relief.       Home Medications Prior to Admission medications   Medication Sig Start Date End Date Taking? Authorizing Provider  bisacodyl 5 MG EC tablet Take 5 mg by mouth daily as needed (for constipation).   Yes [provider]  lidocaine (XYLOCAINE) 2 % solution Use as directed 1 mL in the mouth or throat See admin instructions. Rinse with 1 ml in the mouth every six hours as needed for sores. Rinse, then spit.   Yes [provider]  loratadine (CLARITIN) 10 MG tablet Take 10 mg by mouth daily as needed for allergies.   Yes [provider]  PSYLLIUM PO Take 0.4 g by mouth at bedtime.   Yes [provider]  TYLENOL 8 HOUR ARTHRITIS PAIN 650 MG CR tablet Take 650 mg by mouth every 12 (twelve) hours as needed for pain.   Yes [provider]  alendronate (FOSAMAX) 70 MG tablet TAKE 1 TABLET(70 MG) BY MOUTH EVERY 7 DAYS WITH A FULL GLASS OF WATER AND ON AN EMPTY STOMACH Patient not taking: Reported on 07/14/2022 09/03/21   [name], MD      Allergies    Sulfa antibiotics, Alendronate, Ampicillin, Aspirin, Cyclobenzaprine hcl, Metaxalone, Nitrofurantoin, Sulfonamide derivatives, Aricept [donepezil], Nortriptyline hcl, Nsaids, and Penicillins    Review of  Systems   Review of Systems  Unable to perform ROS: Mental status change  Cardiovascular:  Negative for chest pain.    Physical Exam Updated Vital Signs BP (!) 152/88   Pulse 81   Temp (!) 97.4 F (36.3 C) (Oral)   Resp (!) 24   SpO2 100%  Physical Exam Vitals reviewed.  Constitutional:      Appearance: She is well-developed.  HENT:     Head: Normocephalic.     Comments: Contusion left forehead    Nose: Nose normal.  Eyes:     General: No scleral icterus.    Conjunctiva/sclera: Conjunctivae normal.  Neck:     Thyroid: No thyromegaly.  Cardiovascular:     Rate and Rhythm: Normal rate and regular rhythm.     Heart sounds: No murmur heard.    No friction rub. No gallop.  Pulmonary:     Breath sounds: No stridor. No wheezing or rales.  Chest:     Chest wall: No tenderness.  Abdominal:     General: There is no distension.     Tenderness: There is no abdominal tenderness. There is no rebound.  Musculoskeletal:        General: Normal range of motion.     Cervical back: Neck supple.  Lymphadenopathy:     Cervical: No cervical adenopathy.  Skin:    Findings: No erythema or rash.  Neurological:     Mental  Status: She is alert and oriented to person, place, and time.     Motor: No abnormal muscle tone.     Coordination: Coordination normal.  Psychiatric:        Behavior: Behavior normal.     ED Results / Procedures / Treatments   Labs (all labs ordered are listed, but only abnormal results are displayed) Labs Reviewed  CBC WITH DIFFERENTIAL/PLATELET - Abnormal; Notable for the following components:      Result Value   Neutro Abs 8.6 (*)    Lymphs Abs 0.6 (*)    All other components within normal limits  COMPREHENSIVE METABOLIC PANEL - Abnormal; Notable for the following components:   Glucose, Bld 114 (*)    Calcium 8.3 (*)    All other components within normal limits  TROPONIN I (HIGH SENSITIVITY)  TROPONIN I (HIGH SENSITIVITY)    EKG EKG  Interpretation  Date/Time:  Tuesday August 17 2022 09:23:56 EST Ventricular Rate:  66 PR Interval:  137 QRS Duration: 95 QT Interval:  409 QTC Calculation: 429 R Axis:   -6 Text Interpretation: Sinus rhythm Consider right atrial enlargement Abnormal R-wave progression, early transition ST elevation, consider inferior injury Confirmed by Milton Ferguson [phone]) on 08/17/2022 11:30:50 AM  Radiology MR BRAIN WO CONTRAST  Result Date: 08/17/2022 CLINICAL DATA:  Mental status change, unknown cause.  Fall. EXAM: MRI HEAD WITHOUT CONTRAST TECHNIQUE: Multiplanar, multiecho pulse sequences of the brain and surrounding structures were obtained without intravenous contrast. COMPARISON:  Head CT 08/17/2022 FINDINGS: Brain: There is no evidence of an acute infarct, intracranial hemorrhage, mass, midline shift, or extra-axial fluid collection. T2 hyperintensities in the cerebral white matter bilaterally are nonspecific but compatible with mild chronic small vessel ischemic disease. There are small chronic bilateral cerebellar infarcts. There is advanced cerebral atrophy including severe atrophy anteriorly in both temporal lobes. Vascular: Small, poorly visualized distal right vertebral artery which may be congenitally hypoplastic. Other major intracranial vascular flow voids are preserved. Skull and upper cervical spine: Unremarkable bone marrow signal. Sinuses/Orbits: Persistent small volume fluid in the right frontal sinus. Trace bilateral mastoid fluid. Bilateral cataract extraction. Other: Small left frontal scalp hematoma is noted on CT. IMPRESSION: 1. No acute intracranial abnormality. 2. Mild chronic small vessel ischemic disease with small chronic cerebellar infarcts. 3. Advanced cerebral atrophy. Electronically Signed   By: Logan Bores M.D.   On: 08/17/2022 14:24   CT Cervical Spine Wo Contrast  Result Date: 08/17/2022 CLINICAL DATA:  89 year old female status post unwitnessed fall. Left forehead  hematoma. EXAM: CT CERVICAL SPINE WITHOUT CONTRAST TECHNIQUE: Multidetector CT imaging of the cervical spine was performed without intravenous contrast. Multiplanar CT image reconstructions were also generated. RADIATION DOSE REDUCTION: This exam was performed according to the departmental dose-optimization program which includes automated exposure control, adjustment of the mA and/or kV according to patient size and/or use of iterative reconstruction technique. COMPARISON:  Head CT today reported separately. Cervical spine CT 07/02/2020. FINDINGS: Alignment: Stable since 2022. Straightening of upper cervical lordosis. Cervicothoracic junction alignment is within normal limits. Skull base and vertebrae: Stable skull base since 2022. C1 and C2 appear stable and aligned. No acute osseous abnormality identified. Soft tissues and spinal canal: No prevertebral fluid or swelling. No visible canal hematoma. Negative noncontrast visible neck soft tissues, mild right carotid calcified atherosclerosis. Disc levels: Skull base and cervical spine degeneration has not significantly changed since 2022. Up to mild associated cervicomedullary junction and spinal stenosis. Upper chest: Visible upper thoracic levels appear grossly intact.  Stable visible chronic apical lung scarring. IMPRESSION: 1. No acute traumatic injury identified in the cervical spine. 2. Skull base and Cervical spine degeneration stable since 2022. Electronically Signed   By: Genevie Ann M.D.   On: 08/17/2022 10:49   CT Head Wo Contrast  Result Date: 08/17/2022 CLINICAL DATA:  89 year old female status post unwitnessed fall. Left forehead hematoma. EXAM: CT HEAD WITHOUT CONTRAST TECHNIQUE: Contiguous axial images were obtained from the base of the skull through the vertex without intravenous contrast. RADIATION DOSE REDUCTION: This exam was performed according to the departmental dose-optimization program which includes automated exposure control, adjustment of  the mA and/or kV according to patient size and/or use of iterative reconstruction technique. COMPARISON:  Head CT 07/13/2022. FINDINGS: Brain: Stable cerebral volume, anterior temporal lobe atrophy. No midline shift, ventriculomegaly, mass effect, evidence of mass lesion, intracranial hemorrhage or evidence of cerebral cortically based acute infarction. However, a small linear infarct in the posterior left cerebellar hemisphere on series 2, image 7 is new from last month. Elsewhere stable gray-white matter differentiation with patchy, confluent periventricular white matter hypodensity. Vascular: Calcified atherosclerosis at the skull base. No suspicious intracranial vascular hyperdensity. Skull: Stable visualized osseous structures. Sinuses/Orbits: Visualized paranasal sinuses and mastoids are stable and well aerated except for a small volume of fluid in the inferior right frontal sinus now. Other: Mild left forehead scalp hematoma measures up to 6 mm in thickness. Underlying frontal bones appear stable and intact. Orbits soft tissues appears stable and intact. IMPRESSION: 1. Small left cerebellar infarct is new since the CT on 07/13/2022, age indeterminate. No hemorrhage or mass effect. 2. Otherwise stable non contrast CT appearance of the brain. 3. Mild left forehead scalp hematoma without underlying skull fracture. Electronically Signed   By: Genevie Ann M.D.   On: 08/17/2022 10:46   DG Pelvis Portable  Result Date: 08/17/2022 CLINICAL DATA:  Unwitnessed fall EXAM: PORTABLE PELVIS 1-2 VIEWS COMPARISON:  None Available. FINDINGS: Assessment of the sacrum is limited due to overlying bowel gas. There are surgical staples overlying the pelvis. No evidence of fracture. Bilateral femoral heads are situated in the hip joints. Symmetric appearing SI joints. Degenerative changes in the lower lumbar spine. Mildly prominent loops of small bowel in the mid abdomen, nonspecific. IMPRESSION: 1. No evidence of fracture or  dislocation. If there is high clinical concern, further evaluation with cross-sectional imaging is recommended. 2. Mildly prominent loops of small bowel in the mid abdomen, nonspecific. Electronically Signed   By: Marin Roberts M.D.   On: 08/17/2022 10:22   DG Chest Port 1 View  Result Date: 08/17/2022 CLINICAL DATA:  [place]. EXAM: PORTABLE CHEST 1 VIEW COMPARISON:  07/13/2022 FINDINGS: Findings the cardiac silhouette, mediastinal and hilar contours are within normal limits and stable. The lungs are clear of an acute process. No pleural effusion or pneumothorax. The bony thorax is intact. IMPRESSION: No acute cardiopulmonary findings. Electronically Signed   By: Marijo Sanes M.D.   On: 08/17/2022 10:01    Procedures Procedures    Medications Ordered in ED Medications  sodium chloride 0.9 % bolus 1,000 mL (0 mLs Intravenous Stopped 08/17/22 1234)  sodium chloride 0.9 % bolus 1,000 mL (0 mLs Intravenous Stopped 08/17/22 1235)    ED Course/ Medical Decision Making/ A&P  Patient with a fall and contusion left forehead.  CT scan suggested an infarct in the cerebellar area.  I spoke to neurology and they want to get an MRI.  The MRI did not show an infarct  in this area but it is not acute.  She will be discharged home on aspirin and follow-up with her PCP                           Medical Decision Making Amount and/or Complexity of Data Reviewed Labs: ordered. Radiology: ordered. ECG/medicine tests: ordered.  This patient presents to the ED for concern of fall, this involves an extensive number of treatment options, and is a complaint that carries with it a high risk of complications and morbidity.  The differential diagnosis includes head injury, stroke   Co morbidities that complicate the patient evaluation  Dementia   Additional history obtained:  Additional history obtained from daughter External records from outside source obtained and reviewed including hospital records   Lab  Tests:  I Ordered, and personally interpreted labs.  The pertinent results include: CBC and chemistries unremarkable   Imaging Studies ordered:  I ordered imaging studies including CT head and MRI head I independently visualized and interpreted imaging which showed old cerebellar infarct I agree with the radiologist interpretation   Cardiac Monitoring: / EKG:  The patient was maintained on a cardiac monitor.  I personally viewed and interpreted the cardiac monitored which showed an underlying rhythm of: Normal sinus rhythm   Consultations Obtained:  I requested consultation with the neurology,  and discussed lab and imaging findings as well as pertinent plan - they recommend: Discharge patient on baby aspirin   Problem List / ED Course / Critical interventions / Medication management  Dementia and head injury No medicines given Reevaluation of the patient after these medicines showed that the patient stayed the same I have reviewed the patients home medicines and have made adjustments as needed   Social Determinants of Health:  Demented   Test / Admission - Considered:  None  Fall with contusion of forehead and old small cerebellar strokes        Final Clinical Impression(s) / ED Diagnoses Final diagnoses:  Fall, initial encounter    Rx / DC Orders ED Discharge Orders     None         Milton Ferguson, MD 08/17/22 1733

## 2022-08-17 NOTE — ED Triage Notes (Signed)
EMS reports from [facility], called out for unwitnessed fall from standing while going to bathroom. Prior Diarrhea last week. Head strike with hematoma left forehead. Unknown LOC, no blood thinners. Pt at baseline dementia per staff, also states NoroVirus currently throughout facitlty.  BP 148/78 HR 67 RR 22 Sp02 100 RA CBG 127 Temp 97.8  20ga LAC 183m NS enroute

## 2022-08-17 NOTE — Discharge Instructions (Signed)
Take 1 baby aspirin a day.  Follow-up with your doctor next week for recheck

## 2022-08-23 ENCOUNTER — Other Ambulatory Visit (HOSPITAL_COMMUNITY): Payer: Self-pay

## 2022-08-23 DIAGNOSIS — K5901 Slow transit constipation: Secondary | ICD-10-CM | POA: Diagnosis not present

## 2022-08-23 DIAGNOSIS — W19XXXA Unspecified fall, initial encounter: Secondary | ICD-10-CM | POA: Diagnosis not present

## 2022-08-23 DIAGNOSIS — J302 Other seasonal allergic rhinitis: Secondary | ICD-10-CM | POA: Diagnosis not present

## 2022-08-23 DIAGNOSIS — M150 Primary generalized (osteo)arthritis: Secondary | ICD-10-CM | POA: Diagnosis not present

## 2022-09-06 ENCOUNTER — Ambulatory Visit (INDEPENDENT_AMBULATORY_CARE_PROVIDER_SITE_OTHER): Payer: Medicare Other | Admitting: Podiatry

## 2022-09-06 ENCOUNTER — Encounter: Payer: Self-pay | Admitting: Podiatry

## 2022-09-06 DIAGNOSIS — M79671 Pain in right foot: Secondary | ICD-10-CM

## 2022-09-06 DIAGNOSIS — B351 Tinea unguium: Secondary | ICD-10-CM

## 2022-09-06 DIAGNOSIS — Q828 Other specified congenital malformations of skin: Secondary | ICD-10-CM

## 2022-09-06 DIAGNOSIS — M79675 Pain in left toe(s): Secondary | ICD-10-CM

## 2022-09-06 DIAGNOSIS — M79672 Pain in left foot: Secondary | ICD-10-CM

## 2022-09-06 DIAGNOSIS — M79674 Pain in right toe(s): Secondary | ICD-10-CM | POA: Diagnosis not present

## 2022-09-06 NOTE — Progress Notes (Signed)
  Subjective:  Patient ID: Brenda Hammond, female    DOB: [birth year],  MRN: [id number]  Brenda Hammond presents to clinic today for {jgcomplaint:23593}  Chief Complaint  Patient presents with   Nail Problem    [place] PCP VST-07/2022   New problem(s): None. {jgcomplaint:23593}  PCP is Reymundo Poll, MD.  Allergies  Allergen Reactions   Sulfa Antibiotics Anaphylaxis, Other (See Comments) and Hypertension    Elevated HR and BP, also and "ALLERGIC," per [facility]   Alendronate Other (See Comments)    "twitching" and "ALLERGIC," per [facility]   Ampicillin Other (See Comments)    High fever and "ALLERGIC," per [facility]   Aspirin Other (See Comments)    Made the ears ring and "ALLERGIC," per [facility]   Cyclobenzaprine Hcl Other (See Comments)    Nightmares- "ALLERGIC," per [facility]   Metaxalone Other (See Comments)    Nightmares- "ALLERGIC," per [facility]   Nitrofurantoin Other (See Comments) and Hypertension    Elevated B/P and chest pain- "ALLERGIC," per [facility]   Sulfonamide Derivatives Other (See Comments) and Hypertension    Elevated HR and B/P- "ALLERGIC," per [facility]   Aricept [Donepezil] Rash and Other (See Comments)    Dizziness and cramping also- "ALLERGIC," per [facility]   Nortriptyline Hcl Rash and Other (See Comments)    "ALLERGIC," per [facility]   Nsaids Rash and Other (See Comments)    "ALLERGIC," per [facility]   Penicillins Rash and Other (See Comments)    High fever, too- "ALLERGIC," per [facility]    Review of Systems: Negative except as noted in the HPI.  Objective: No changes noted in today's physical examination. There were no vitals filed for this visit. Brenda Hammond is a pleasant 89 y.o. female {jgbodyhabitus:24098} AAO x 3.  Vascular CFT immediate b/l LE. Palpable DP/PT pulses b/l LE. Digital hair absent b/l. Skin temperature gradient WNL b/l. No pain with calf compression b/l. No edema noted b/l. No cyanosis or clubbing noted b/l LE.  Neurologic Normal speech. Oriented to  person, place, and time. Protective sensation intact 5/5 intact bilaterally with 10g monofilament b/l. Vibratory sensation intact b/l.  Dermatologic Pedal skin thin and atrophic b/l LE. No open wounds b/l LE. No interdigital macerations noted b/l LE. Toenails 1-5 b/l elongated, discolored, dystrophic, thickened, crumbly with subungual debris and tenderness to dorsal palpation.   Minimal hyperkeratosis noted plantar aspect submet head 2, 3 right foot.   Orthopedic: Muscle strength 5/5 to all lower extremity muscle groups bilaterally. Hammertoe deformity noted 2-5 b/l.   Assessment/Plan: No diagnosis found.  No orders of the defined types were placed in this encounter.   None {Jgplan:23602::"-Patient/POA to call should there be question/concern in the interim."}   Return in about 3 months (around 12/07/2022).  Marzetta Board, DPM

## 2022-09-20 DIAGNOSIS — K5901 Slow transit constipation: Secondary | ICD-10-CM | POA: Diagnosis not present

## 2022-09-20 DIAGNOSIS — J302 Other seasonal allergic rhinitis: Secondary | ICD-10-CM | POA: Diagnosis not present

## 2022-09-20 DIAGNOSIS — M150 Primary generalized (osteo)arthritis: Secondary | ICD-10-CM | POA: Diagnosis not present

## 2022-10-06 ENCOUNTER — Emergency Department (HOSPITAL_COMMUNITY)
Admission: EM | Admit: 2022-10-06 | Discharge: 2022-10-06 | Disposition: A | Discharge status: Skilled Nursing Facility | Payer: Medicare Other | Attending: Emergency Medicine | Admitting: Emergency Medicine

## 2022-10-06 ENCOUNTER — Emergency Department (HOSPITAL_COMMUNITY): Payer: Medicare Other

## 2022-10-06 ENCOUNTER — Encounter (HOSPITAL_COMMUNITY): Payer: Self-pay

## 2022-10-06 DIAGNOSIS — R110 Nausea: Secondary | ICD-10-CM | POA: Diagnosis not present

## 2022-10-06 DIAGNOSIS — R059 Cough, unspecified: Secondary | ICD-10-CM | POA: Diagnosis not present

## 2022-10-06 DIAGNOSIS — Z743 Need for continuous supervision: Secondary | ICD-10-CM | POA: Diagnosis not present

## 2022-10-06 DIAGNOSIS — B349 Viral infection, unspecified: Secondary | ICD-10-CM | POA: Insufficient documentation

## 2022-10-06 DIAGNOSIS — J00 Acute nasopharyngitis [common cold]: Secondary | ICD-10-CM | POA: Diagnosis not present

## 2022-10-06 DIAGNOSIS — R404 Transient alteration of awareness: Secondary | ICD-10-CM | POA: Diagnosis not present

## 2022-10-06 LAB — COMPREHENSIVE METABOLIC PANEL
ALT: 15 U/L (ref 0–44)
AST: 30 U/L (ref 15–41)
Albumin: 3.4 g/dL — ABNORMAL LOW (ref 3.5–5.0)
Alkaline Phosphatase: 104 U/L (ref 38–126)
Anion gap: 9 (ref 5–15)
BUN: 11 mg/dL (ref 8–23)
CO2: 25 mmol/L (ref 22–32)
Calcium: 8.5 mg/dL — ABNORMAL LOW (ref 8.9–10.3)
Chloride: 102 mmol/L (ref 98–111)
Creatinine, Ser: 0.95 mg/dL (ref 0.44–1.00)
GFR, Estimated: 58 mL/min — ABNORMAL LOW (ref >60)
Glucose, Bld: 104 mg/dL — ABNORMAL HIGH (ref 70–99)
Potassium: 4.3 mmol/L (ref 3.5–5.1)
Sodium: 136 mmol/L (ref 135–145)
Total Bilirubin: 0.8 mg/dL (ref 0.3–1.2)
Total Protein: 7 g/dL (ref 6.5–8.1)

## 2022-10-06 LAB — CBC WITH DIFFERENTIAL/PLATELET
Abs Immature Granulocytes: 0.03 10*3/uL (ref 0.00–0.07)
Basophils Absolute: 0 10*3/uL (ref 0.0–0.1)
Basophils Relative: 0 %
Eosinophils Absolute: 0.1 10*3/uL (ref 0.0–0.5)
Eosinophils Relative: 2 %
HCT: 39.7 % (ref 36.0–46.0)
Hemoglobin: 13 g/dL (ref 12.0–15.0)
Immature Granulocytes: 0 %
Lymphocytes Relative: 11 %
Lymphs Abs: 0.8 10*3/uL (ref 0.7–4.0)
MCH: 29.1 pg (ref 26.0–34.0)
MCHC: 32.7 g/dL (ref 30.0–36.0)
MCV: 89 fL (ref 80.0–100.0)
Monocytes Absolute: 0.7 10*3/uL (ref 0.1–1.0)
Monocytes Relative: 10 %
Neutro Abs: 5.6 10*3/uL (ref 1.7–7.7)
Neutrophils Relative %: 77 %
Platelets: 196 10*3/uL (ref 150–400)
RBC: 4.46 MIL/uL (ref 3.87–5.11)
RDW: 14 % (ref 11.5–15.5)
WBC: 7.2 10*3/uL (ref 4.0–10.5)
nRBC: 0 % (ref 0.0–0.2)

## 2022-10-06 LAB — MAGNESIUM: Magnesium: 2.1 mg/dL (ref 1.7–2.4)

## 2022-10-06 LAB — LIPASE, BLOOD: Lipase: 37 U/L (ref 11–51)

## 2022-10-06 MED ORDER — SODIUM CHLORIDE 0.9 % IV BOLUS: 1000.0000 mL | Freq: Once | INTRAVENOUS | Status: DC

## 2022-10-06 MED ORDER — ONDANSETRON 4 MG PO TBDP: ORAL_TABLET | ORAL | 0 refills | Status: DC

## 2022-10-06 MED ORDER — LOPERAMIDE HCL 2 MG PO CAPS: 2.0000 mg | ORAL_CAPSULE | Freq: Four times a day (QID) | ORAL | 0 refills | Status: DC | PRN

## 2022-10-06 NOTE — ED Notes (Signed)
[facility] her to get pt

## 2022-10-06 NOTE — ED Triage Notes (Signed)
Pt BIB [facility] facility d/t cold like symptoms. Pt c/o fatigue and nausea. Pt has dementia.   116/68 98% RA  90 HR  132 cbg

## 2022-10-06 NOTE — ED Notes (Signed)
Attempted report twice to [facility]. No response.

## 2022-10-06 NOTE — ED Notes (Signed)
Updated pt's daughter ?

## 2022-10-06 NOTE — ED Provider Notes (Signed)
[place] EMERGENCY DEPARTMENT AT [facility] Provider Note   CSN: [id number] Arrival date & time: 10/06/22  0915     History  Chief Complaint  Patient presents with   cold    Brenda Hammond is a 89 y.o. female.  89 yo F with a chief complaints of cough congestion, nausea and vomiting.  This is all reported by EMS which was reported by the nursing facility.  Patient lives in a memory care unit and is unable to provide any history.  Level 5 caveat.        Home Medications Prior to Admission medications   Medication Sig Start Date End Date Taking? Authorizing Provider  loperamide (IMODIUM) 2 MG capsule Take 1 capsule (2 mg total) by mouth 4 (four) times daily as needed for diarrhea or loose stools. 10/06/22  Yes Melene Plan, DO  ondansetron (ZOFRAN-ODT) 4 MG disintegrating tablet 4mg  ODT q4 hours prn nausea/vomit 10/06/22  Yes Melene Plan, DO  alendronate (FOSAMAX) 70 MG tablet TAKE 1 TABLET(70 MG) BY MOUTH EVERY 7 DAYS WITH A FULL GLASS OF WATER AND ON AN EMPTY STOMACH Patient not taking: Reported on 07/14/2022 09/03/21   Mliss Sax, MD  bisacodyl 5 MG EC tablet Take 5 mg by mouth daily as needed (for constipation).    [provider]  lidocaine (XYLOCAINE) 2 % solution Use as directed 1 mL in the mouth or throat See admin instructions. Rinse with 1 ml in the mouth every six hours as needed for sores. Rinse, then spit.    [provider]  loratadine (CLARITIN) 10 MG tablet Take 10 mg by mouth daily as needed for allergies.    [provider]  PSYLLIUM PO Take 0.4 g by mouth at bedtime.    [provider]  TYLENOL 8 HOUR ARTHRITIS PAIN 650 MG CR tablet Take 650 mg by mouth every 12 (twelve) hours as needed for pain.    [provider]      Allergies    Sulfa antibiotics, Alendronate, Ampicillin, Aspirin, Cyclobenzaprine hcl, Metaxalone, Nitrofurantoin, Sulfonamide derivatives, Aricept [donepezil], Nortriptyline  hcl, Nsaids, and Penicillins    Review of Systems   Review of Systems  Physical Exam Updated Vital Signs BP (!) 121/54   Pulse 67   Temp 98.7 F (37.1 C) (Oral)   Resp (!) 21   SpO2 94%  Physical Exam Vitals and nursing note reviewed.  Constitutional:      General: She is not in acute distress.    Appearance: She is well-developed. She is not diaphoretic.  HENT:     Head: Normocephalic and atraumatic.  Eyes:     Pupils: Pupils are equal, round, and reactive to light.  Cardiovascular:     Rate and Rhythm: Normal rate and regular rhythm.     Heart sounds: No murmur heard.    No friction rub. No gallop.  Pulmonary:     Effort: Pulmonary effort is normal.     Breath sounds: No wheezing or rales.  Abdominal:     General: There is no distension.     Palpations: Abdomen is soft.     Tenderness: There is no abdominal tenderness.  Musculoskeletal:        General: No tenderness.     Cervical back: Normal range of motion and neck supple.  Skin:    General: Skin is warm and dry.  Neurological:     Mental Status: She is alert and oriented to person, place, and time.  Psychiatric:        Behavior: Behavior normal.     ED Results / Procedures / Treatments   Labs (all labs ordered are listed, but only abnormal results are displayed) Labs Reviewed  COMPREHENSIVE METABOLIC PANEL - Abnormal; Notable for the following components:      Result Value   Glucose, Bld 104 (*)    Calcium 8.5 (*)    Albumin 3.4 (*)    GFR, Estimated 58 (*)    All other components within normal limits  CBC WITH DIFFERENTIAL/PLATELET  LIPASE, BLOOD  MAGNESIUM    EKG None  Radiology DG Chest Port 1 View  Result Date: 10/06/2022 CLINICAL DATA:  C[place]. EXAM: PORTABLE CHEST 1 VIEW COMPARISON:  08/17/2022 FINDINGS: Stable cardiomediastinal contours. No pleural effusion or edema identified. No airspace opacities identified. Visualized osseous structures appear intact. IMPRESSION: No acute  cardiopulmonary abnormalities. Electronically Signed   By: Signa Kellaylor  [place] M.D.   On: 10/06/2022 09:41    Procedures Procedures    Medications Ordered in ED Medications - No data to display   ED Course/ Medical Decision Making/ A&P                             Medical Decision Making Amount and/or Complexity of Data Reviewed Labs: ordered. Radiology: ordered.  Risk Prescription drug management.   89 yo F with a chief complaints of what sounds like a viral syndrome cough congestion nausea vomiting going on for a couple days.  She lives in a memory care unit.  Is unable provide any history.  Will obtain a laboratory evaluation.  Bolus of IV fluids.  Patient without any anemia, no leukocytosis, no significant electrolyte abnormality.  Chest x-ray independently interpreted by me without focal infiltrate or pneumothorax.  Patient has had no vomiting here.  Tolerating by mouth.  Will discharge home.  PCP follow-up.  1:03 PM:  I have discussed the diagnosis/risks/treatment options with the patient.  Evaluation and diagnostic testing in the emergency department does not suggest an emergent condition requiring admission or immediate intervention beyond what has been performed at this time.  They will follow up with PCP. We also discussed returning to the ED immediately if new or worsening sx occur. We discussed the sx which are most concerning (e.g., sudden worsening pain, fever, inability to tolerate by mouth) that necessitate immediate return. Medications administered to the patient during their visit and any new prescriptions provided to the patient are listed below.  Medications given during this visit Medications - No data to display   The patient appears reasonably screen and/or stabilized for discharge and I doubt any other medical condition or other [facility] requiring further screening, evaluation, or treatment in the ED at this time prior to discharge.          Final Clinical  Impression(s) / ED Diagnoses Final diagnoses:  Viral syndrome    Rx / DC Orders ED Discharge Orders          Ordered    loperamide (IMODIUM) 2 MG capsule  4 times daily PRN        10/06/22 1206    ondansetron (ZOFRAN-ODT) 4 MG disintegrating tablet        10/06/22 1206              Melene PlanFloyd, Makylee Sanborn, DO 10/06/22 1303

## 2022-10-06 NOTE — ED Notes (Signed)
Paperwork and DNR given to [facility]

## 2022-10-06 NOTE — Discharge Instructions (Signed)
Take tylenol 2 pills 4 times a day and motrin 4 pills 3 times a day.  Drink plenty of fluids.  Return for worsening shortness of breath, headache, confusion. Follow up with your family doctor.   Zofran for nausea.  Imodium for diarrhea.

## 2022-10-06 NOTE — ED Notes (Signed)
Got patient into a gown on the monitor patient is resting with call bell in reach 

## 2022-10-06 NOTE — ED Notes (Signed)
[facility] called for transport to [facility] [address] 

## 2022-10-11 DIAGNOSIS — K5901 Slow transit constipation: Secondary | ICD-10-CM | POA: Diagnosis not present

## 2022-10-11 DIAGNOSIS — W19XXXA Unspecified fall, initial encounter: Secondary | ICD-10-CM | POA: Diagnosis not present

## 2022-10-13 DIAGNOSIS — I4891 Unspecified atrial fibrillation: Secondary | ICD-10-CM | POA: Diagnosis not present

## 2022-10-13 DIAGNOSIS — D649 Anemia, unspecified: Secondary | ICD-10-CM | POA: Diagnosis not present

## 2022-10-13 DIAGNOSIS — K5904 Chronic idiopathic constipation: Secondary | ICD-10-CM | POA: Diagnosis not present

## 2022-10-13 DIAGNOSIS — M810 Age-related osteoporosis without current pathological fracture: Secondary | ICD-10-CM | POA: Diagnosis not present

## 2022-10-13 DIAGNOSIS — F0394 Unspecified dementia, unspecified severity, with anxiety: Secondary | ICD-10-CM | POA: Diagnosis not present

## 2022-10-19 DIAGNOSIS — D649 Anemia, unspecified: Secondary | ICD-10-CM | POA: Diagnosis not present

## 2022-10-19 DIAGNOSIS — I4891 Unspecified atrial fibrillation: Secondary | ICD-10-CM | POA: Diagnosis not present

## 2022-10-19 DIAGNOSIS — F0394 Unspecified dementia, unspecified severity, with anxiety: Secondary | ICD-10-CM | POA: Diagnosis not present

## 2022-10-19 DIAGNOSIS — M810 Age-related osteoporosis without current pathological fracture: Secondary | ICD-10-CM | POA: Diagnosis not present

## 2022-10-19 DIAGNOSIS — K5904 Chronic idiopathic constipation: Secondary | ICD-10-CM | POA: Diagnosis not present

## 2022-10-21 DIAGNOSIS — M810 Age-related osteoporosis without current pathological fracture: Secondary | ICD-10-CM | POA: Diagnosis not present

## 2022-10-21 DIAGNOSIS — D649 Anemia, unspecified: Secondary | ICD-10-CM | POA: Diagnosis not present

## 2022-10-21 DIAGNOSIS — K5904 Chronic idiopathic constipation: Secondary | ICD-10-CM | POA: Diagnosis not present

## 2022-10-21 DIAGNOSIS — I4891 Unspecified atrial fibrillation: Secondary | ICD-10-CM | POA: Diagnosis not present

## 2022-10-21 DIAGNOSIS — F0394 Unspecified dementia, unspecified severity, with anxiety: Secondary | ICD-10-CM | POA: Diagnosis not present

## 2022-10-25 DIAGNOSIS — M150 Primary generalized (osteo)arthritis: Secondary | ICD-10-CM | POA: Diagnosis not present

## 2022-10-25 DIAGNOSIS — K5901 Slow transit constipation: Secondary | ICD-10-CM | POA: Diagnosis not present

## 2022-10-25 DIAGNOSIS — J302 Other seasonal allergic rhinitis: Secondary | ICD-10-CM | POA: Diagnosis not present

## 2022-10-27 ENCOUNTER — Emergency Department (HOSPITAL_COMMUNITY)
Admission: EM | Admit: 2022-10-27 | Discharge: 2022-10-27 | Disposition: A | Discharge status: Skilled Nursing Facility | Payer: Medicare Other | Attending: Emergency Medicine | Admitting: Emergency Medicine

## 2022-10-27 ENCOUNTER — Emergency Department (HOSPITAL_COMMUNITY): Payer: Medicare Other

## 2022-10-27 ENCOUNTER — Encounter (HOSPITAL_COMMUNITY): Payer: Self-pay

## 2022-10-27 DIAGNOSIS — R6889 Other general symptoms and signs: Secondary | ICD-10-CM | POA: Diagnosis not present

## 2022-10-27 DIAGNOSIS — R55 Syncope and collapse: Secondary | ICD-10-CM | POA: Insufficient documentation

## 2022-10-27 DIAGNOSIS — R070 Pain in throat: Secondary | ICD-10-CM | POA: Diagnosis not present

## 2022-10-27 DIAGNOSIS — Z743 Need for continuous supervision: Secondary | ICD-10-CM | POA: Diagnosis not present

## 2022-10-27 DIAGNOSIS — Z7401 Bed confinement status: Secondary | ICD-10-CM | POA: Diagnosis not present

## 2022-10-27 DIAGNOSIS — F0394 Unspecified dementia, unspecified severity, with anxiety: Secondary | ICD-10-CM | POA: Diagnosis not present

## 2022-10-27 DIAGNOSIS — K5904 Chronic idiopathic constipation: Secondary | ICD-10-CM | POA: Diagnosis not present

## 2022-10-27 DIAGNOSIS — R404 Transient alteration of awareness: Secondary | ICD-10-CM | POA: Diagnosis not present

## 2022-10-27 LAB — CBC
HCT: 40.2 % (ref 36.0–46.0)
Hemoglobin: 12.5 g/dL (ref 12.0–15.0)
MCH: 29 pg (ref 26.0–34.0)
MCHC: 31.1 g/dL (ref 30.0–36.0)
MCV: 93.3 fL (ref 80.0–100.0)
Platelets: 275 10*3/uL (ref 150–400)
RBC: 4.31 MIL/uL (ref 3.87–5.11)
RDW: 13.4 % (ref 11.5–15.5)
WBC: 8.4 10*3/uL (ref 4.0–10.5)
nRBC: 0 % (ref 0.0–0.2)

## 2022-10-27 LAB — BASIC METABOLIC PANEL
Anion gap: 7 (ref 5–15)
BUN: 12 mg/dL (ref 8–23)
CO2: 23 mmol/L (ref 22–32)
Calcium: 8.6 mg/dL — ABNORMAL LOW (ref 8.9–10.3)
Chloride: 109 mmol/L (ref 98–111)
Creatinine, Ser: 1 mg/dL (ref 0.44–1.00)
GFR, Estimated: 55 mL/min — ABNORMAL LOW (ref >60)
Glucose, Bld: 111 mg/dL — ABNORMAL HIGH (ref 70–99)
Potassium: 4 mmol/L (ref 3.5–5.1)
Sodium: 139 mmol/L (ref 135–145)

## 2022-10-27 LAB — CBG MONITORING, ED: Glucose-Capillary: 102 mg/dL — ABNORMAL HIGH (ref 70–99)

## 2022-10-27 MED ORDER — SODIUM CHLORIDE 0.9 % IV BOLUS
500.0000 mL | Freq: Once | INTRAVENOUS | Status: AC
  Administered 2022-10-27: 500 mL via INTRAVENOUS

## 2022-10-27 NOTE — ED Notes (Signed)
Patient transported to CT 

## 2022-10-27 NOTE — ED Notes (Signed)
Called [facility]  FOR 31  ETA 15

## 2022-10-27 NOTE — ED Provider Notes (Signed)
[place] EMERGENCY DEPARTMENT AT [facility] Provider Note   CSN: [id number] Arrival date & time: 10/27/22  4782     History  Chief Complaint  Patient presents with   Loss of Consciousness    Brenda Hammond is a 89 y.o. female.   Loss of Consciousness Patient reportedly in for syncope.  Poorly had a witnessed syncopal episode in the living room.  Assisted to floor.  Patient is awake but has baseline dementia and really cannot provide much history.  Without any complaints at this time besides the pain from the nurse starting the IV in her left arm.    Past Medical History:  Diagnosis Date   Anemia    Anxiety    Atrial fibrillation (HCC)    Colon adenocarcinoma (HCC)    DJD (degenerative joint disease)    Fibromyalgia    GERD (gastroesophageal reflux disease)    History of poliomyelitis    History of UTI    Hyperlipemia    Memory disorder 03/01/2019   Other drug allergy(995.27)    Scoliosis    Surgical or other procedure not carried out because of patient's decision    Venous insufficiency     Home Medications Prior to Admission medications   Medication Sig Start Date End Date Taking? Authorizing Provider  bisacodyl 5 MG EC tablet Take 5 mg by mouth daily as needed (for constipation).   Yes [provider]  lidocaine (XYLOCAINE) 2 % solution Use as directed 1 mL in the mouth or throat See admin instructions. Rinse with 1 ml in the mouth every six hours as needed for sores. Rinse, then spit.   Yes [provider]  loratadine (CLARITIN) 10 MG tablet Take 10 mg by mouth daily as needed for allergies.   Yes [provider]  PSYLLIUM PO Take 0.4 g by mouth at bedtime.   Yes [provider]  TYLENOL 8 HOUR ARTHRITIS PAIN 650 MG CR tablet Take 650 mg by mouth every 12 (twelve) hours as needed for pain.   Yes [provider]  alendronate (FOSAMAX) 70 MG tablet TAKE 1 TABLET(70 MG) BY MOUTH EVERY 7 DAYS WITH A FULL  GLASS OF WATER AND ON AN EMPTY STOMACH Patient not taking: Reported on 07/14/2022 09/03/21   Mliss Sax, MD  loperamide (IMODIUM) 2 MG capsule Take 1 capsule (2 mg total) by mouth 4 (four) times daily as needed for diarrhea or loose stools. Patient not taking: Reported on 10/27/2022 10/06/22   Melene Plan, DO  ondansetron (ZOFRAN-ODT) 4 MG disintegrating tablet 4mg  ODT q4 hours prn nausea/vomit Patient not taking: Reported on 10/27/2022 10/06/22   Melene Plan, DO      Allergies    Sulfa antibiotics, Alendronate, Ampicillin, Aspirin, Cyclobenzaprine hcl, Metaxalone, Nitrofurantoin, Sulfonamide derivatives, Aricept [donepezil], Nortriptyline hcl, Nsaids, and Penicillins    Review of Systems   Review of Systems  Cardiovascular:  Positive for syncope.    Physical Exam Updated Vital Signs BP (!) 117/98   Pulse 67   Temp 97.8 F (36.6 C) (Oral)   Resp 14   Ht 5\' 1"  (1.549 m)   Wt 53.6 kg   SpO2 99%   BMI 22.33 kg/m  Physical Exam Vitals and nursing note reviewed.  Eyes:     Pupils: Pupils are equal, round, and reactive to light.  Cardiovascular:     Rate and Rhythm: Normal rate and regular rhythm.  Abdominal:     Tenderness: There is no abdominal tenderness.  Skin:    General: Skin is warm.  Neurological:     Mental Status: She is alert. Mental status is at baseline.     ED Results / Procedures / Treatments   Labs (all labs ordered are listed, but only abnormal results are displayed) Labs Reviewed  BASIC METABOLIC PANEL - Abnormal; Notable for the following components:      Result Value   Glucose, Bld 111 (*)    Calcium 8.6 (*)    GFR, Estimated 55 (*)    All other components within normal limits  CBG MONITORING, ED - Abnormal; Notable for the following components:   Glucose-Capillary 102 (*)    All other components within normal limits  CBC    EKG None  Radiology CT HEAD WO CONTRAST ( )  Result Date: 10/27/2022 CLINICAL DATA:  89 year old female status  post loss of consciousness while sitting at table this morning. EXAM: CT HEAD WITHOUT CONTRAST TECHNIQUE: Contiguous axial images were obtained from the base of the skull through the vertex without intravenous contrast. RADIATION DOSE REDUCTION: This exam was performed according to the departmental dose-optimization program which includes automated exposure control, adjustment of the mA and/or kV according to patient size and/or use of iterative reconstruction technique. COMPARISON:  Brain MRI, Head CT 08/17/2022. FINDINGS: Brain: Stable cerebral volume, chronic temporal lobe atrophy (coronal image 26). No midline shift, mass effect, or evidence of intracranial mass lesion. No ventriculomegaly. No acute intracranial hemorrhage identified. Chronic bilateral cerebellar infarcts, gray-white matter differentiation elsewhere are stable since February. Vascular: Calcified atherosclerosis at the skull base. Trace intravenous gas visible both in the right cavernous sinus (series 4, image 30) and also in the right temporal vein (image 34), probably related to recent IV access. No suspicious intracranial vascular hyperdensity. Skull: No acute osseous abnormality identified. Sinuses/Orbits: Mild new maxillary sinus mucosal thickening primarily at the alveolar recesses. But right frontal sinus aeration has improved since February, mild residual bubbly opacity there. Tympanic cavities and mastoids are clear. Other: No acute orbit or scalp soft tissue finding. IMPRESSION: 1. No acute intracranial abnormality. 2. Stable non contrast CT appearance of chronic small vessel disease, bitemporal atrophy. Cerebral Atrophy (ICD10-G31.9). Electronically Signed   By: Odessa Fleming M.D.   On: 10/27/2022 10:48    Procedures Procedures    Medications Ordered in ED Medications  sodium chloride 0.9 % bolus 500 mL (0 mLs Intravenous Stopped 10/27/22 1145)    ED Course/ Medical Decision Making/ A&P                             Medical  Decision Making Amount and/or Complexity of Data Reviewed Labs: ordered. Radiology: ordered.   Patient with syncope.  History of same.  Reviewed notes from admission 3 and half months ago.  No injury from the syncope.  Will check basic blood work and EKG.  EKG is reassuring.  Will do orthostatic vital signs.  Will give small fluid bolus.  Patient's workup overall reassuring.  Discussed with patient's daughter.  She had told the patient had weakness on one side although EMS report to [place] was weakness in both sides.  Either way it does not have deficit at this time.  However will get head CT because of that.  Head CT reassuring.  Discussed with patient's daughter.  She said she did not even want the patient to come in but the nursing home would not let her stay there.  At this point  workup appears reassuring enough for outpatient management of any further details if needed.  Doubt acute seizure.  Potentially arrhythmia but none seen on monitoring or on EKG.  Follow-up with PCP.        Final Clinical Impression(s) / ED Diagnoses Final diagnoses:  Syncope, unspecified syncope type    Rx / DC Orders ED Discharge Orders     None         Benjiman Core, MD 10/27/22 1251

## 2022-10-27 NOTE — ED Triage Notes (Signed)
Hx of dementia. Lives in [place]. Witnessed syncopal episode while in living room. Assisted to the floor. No falls. Pt is alert and oriented x 2.

## 2022-10-27 NOTE — ED Notes (Addendum)
Spoke with daughter Shawna Orleans who states the nursing home told her that the patient was sitting at the table and became unresponsive. They tried to lift her arms and the left was "completely flaccid"  Daughter states she had a CT in Feb that showed stroke changes that were not there in January. They decided not put her on blood thinners due to frequent falls. Daughter is now asking for CT and neurology consult.   Shawna Orleans  [phone]

## 2022-10-27 NOTE — ED Notes (Signed)
Orthostatic   Laying 122/54 HR 62 Sitting 115/70 HR 76 Standing 110/62 HR 70

## 2022-10-28 DIAGNOSIS — D649 Anemia, unspecified: Secondary | ICD-10-CM | POA: Diagnosis not present

## 2022-10-28 DIAGNOSIS — M810 Age-related osteoporosis without current pathological fracture: Secondary | ICD-10-CM | POA: Diagnosis not present

## 2022-10-28 DIAGNOSIS — I4891 Unspecified atrial fibrillation: Secondary | ICD-10-CM | POA: Diagnosis not present

## 2022-10-28 DIAGNOSIS — K5904 Chronic idiopathic constipation: Secondary | ICD-10-CM | POA: Diagnosis not present

## 2022-10-28 DIAGNOSIS — F0394 Unspecified dementia, unspecified severity, with anxiety: Secondary | ICD-10-CM | POA: Diagnosis not present

## 2022-11-03 DIAGNOSIS — M810 Age-related osteoporosis without current pathological fracture: Secondary | ICD-10-CM | POA: Diagnosis not present

## 2022-11-03 DIAGNOSIS — K5904 Chronic idiopathic constipation: Secondary | ICD-10-CM | POA: Diagnosis not present

## 2022-11-03 DIAGNOSIS — D649 Anemia, unspecified: Secondary | ICD-10-CM | POA: Diagnosis not present

## 2022-11-03 DIAGNOSIS — I4891 Unspecified atrial fibrillation: Secondary | ICD-10-CM | POA: Diagnosis not present

## 2022-11-03 DIAGNOSIS — F0394 Unspecified dementia, unspecified severity, with anxiety: Secondary | ICD-10-CM | POA: Diagnosis not present

## 2022-11-09 DIAGNOSIS — M810 Age-related osteoporosis without current pathological fracture: Secondary | ICD-10-CM | POA: Diagnosis not present

## 2022-11-09 DIAGNOSIS — K5904 Chronic idiopathic constipation: Secondary | ICD-10-CM | POA: Diagnosis not present

## 2022-11-09 DIAGNOSIS — D649 Anemia, unspecified: Secondary | ICD-10-CM | POA: Diagnosis not present

## 2022-11-09 DIAGNOSIS — F0394 Unspecified dementia, unspecified severity, with anxiety: Secondary | ICD-10-CM | POA: Diagnosis not present

## 2022-11-09 DIAGNOSIS — I4891 Unspecified atrial fibrillation: Secondary | ICD-10-CM | POA: Diagnosis not present

## 2022-11-10 DIAGNOSIS — I4891 Unspecified atrial fibrillation: Secondary | ICD-10-CM | POA: Diagnosis not present

## 2022-11-10 DIAGNOSIS — D649 Anemia, unspecified: Secondary | ICD-10-CM | POA: Diagnosis not present

## 2022-11-10 DIAGNOSIS — M810 Age-related osteoporosis without current pathological fracture: Secondary | ICD-10-CM | POA: Diagnosis not present

## 2022-11-10 DIAGNOSIS — K5904 Chronic idiopathic constipation: Secondary | ICD-10-CM | POA: Diagnosis not present

## 2022-11-10 DIAGNOSIS — F0394 Unspecified dementia, unspecified severity, with anxiety: Secondary | ICD-10-CM | POA: Diagnosis not present

## 2022-11-15 DIAGNOSIS — M150 Primary generalized (osteo)arthritis: Secondary | ICD-10-CM | POA: Diagnosis not present

## 2022-11-15 DIAGNOSIS — F0394 Unspecified dementia, unspecified severity, with anxiety: Secondary | ICD-10-CM | POA: Diagnosis not present

## 2022-11-15 DIAGNOSIS — K5904 Chronic idiopathic constipation: Secondary | ICD-10-CM | POA: Diagnosis not present

## 2022-11-24 DIAGNOSIS — K5904 Chronic idiopathic constipation: Secondary | ICD-10-CM | POA: Diagnosis not present

## 2022-11-24 DIAGNOSIS — I4891 Unspecified atrial fibrillation: Secondary | ICD-10-CM | POA: Diagnosis not present

## 2022-11-24 DIAGNOSIS — F0394 Unspecified dementia, unspecified severity, with anxiety: Secondary | ICD-10-CM | POA: Diagnosis not present

## 2022-11-24 DIAGNOSIS — D649 Anemia, unspecified: Secondary | ICD-10-CM | POA: Diagnosis not present

## 2022-11-24 DIAGNOSIS — M810 Age-related osteoporosis without current pathological fracture: Secondary | ICD-10-CM | POA: Diagnosis not present

## 2022-11-24 DIAGNOSIS — M1990 Unspecified osteoarthritis, unspecified site: Secondary | ICD-10-CM | POA: Diagnosis not present

## 2022-11-29 DIAGNOSIS — M1990 Unspecified osteoarthritis, unspecified site: Secondary | ICD-10-CM | POA: Diagnosis not present

## 2022-11-29 DIAGNOSIS — F0394 Unspecified dementia, unspecified severity, with anxiety: Secondary | ICD-10-CM | POA: Diagnosis not present

## 2022-11-29 DIAGNOSIS — R296 Repeated falls: Secondary | ICD-10-CM | POA: Diagnosis not present

## 2022-11-30 DIAGNOSIS — M810 Age-related osteoporosis without current pathological fracture: Secondary | ICD-10-CM | POA: Diagnosis not present

## 2022-11-30 DIAGNOSIS — K5904 Chronic idiopathic constipation: Secondary | ICD-10-CM | POA: Diagnosis not present

## 2022-11-30 DIAGNOSIS — D649 Anemia, unspecified: Secondary | ICD-10-CM | POA: Diagnosis not present

## 2022-11-30 DIAGNOSIS — F0394 Unspecified dementia, unspecified severity, with anxiety: Secondary | ICD-10-CM | POA: Diagnosis not present

## 2022-11-30 DIAGNOSIS — I4891 Unspecified atrial fibrillation: Secondary | ICD-10-CM | POA: Diagnosis not present

## 2022-12-08 DIAGNOSIS — M6281 Muscle weakness (generalized): Secondary | ICD-10-CM | POA: Diagnosis not present

## 2022-12-08 DIAGNOSIS — K5904 Chronic idiopathic constipation: Secondary | ICD-10-CM | POA: Diagnosis not present

## 2022-12-08 DIAGNOSIS — R238 Other skin changes: Secondary | ICD-10-CM | POA: Diagnosis not present

## 2022-12-08 DIAGNOSIS — F0394 Unspecified dementia, unspecified severity, with anxiety: Secondary | ICD-10-CM | POA: Diagnosis not present

## 2022-12-08 DIAGNOSIS — I4891 Unspecified atrial fibrillation: Secondary | ICD-10-CM | POA: Diagnosis not present

## 2022-12-08 DIAGNOSIS — M722 Plantar fascial fibromatosis: Secondary | ICD-10-CM | POA: Diagnosis not present

## 2022-12-08 DIAGNOSIS — R262 Difficulty in walking, not elsewhere classified: Secondary | ICD-10-CM | POA: Diagnosis not present

## 2022-12-08 DIAGNOSIS — M810 Age-related osteoporosis without current pathological fracture: Secondary | ICD-10-CM | POA: Diagnosis not present

## 2022-12-08 DIAGNOSIS — D649 Anemia, unspecified: Secondary | ICD-10-CM | POA: Diagnosis not present

## 2022-12-08 DIAGNOSIS — B351 Tinea unguium: Secondary | ICD-10-CM | POA: Diagnosis not present

## 2022-12-08 DIAGNOSIS — L600 Ingrowing nail: Secondary | ICD-10-CM | POA: Diagnosis not present

## 2022-12-20 DIAGNOSIS — M1990 Unspecified osteoarthritis, unspecified site: Secondary | ICD-10-CM | POA: Diagnosis not present

## 2022-12-20 DIAGNOSIS — K5909 Other constipation: Secondary | ICD-10-CM | POA: Diagnosis not present

## 2023-01-17 DIAGNOSIS — M810 Age-related osteoporosis without current pathological fracture: Secondary | ICD-10-CM | POA: Diagnosis not present

## 2023-01-17 DIAGNOSIS — K5904 Chronic idiopathic constipation: Secondary | ICD-10-CM | POA: Diagnosis not present

## 2023-01-17 DIAGNOSIS — R7301 Impaired fasting glucose: Secondary | ICD-10-CM | POA: Diagnosis not present

## 2023-01-21 DIAGNOSIS — E559 Vitamin D deficiency, unspecified: Secondary | ICD-10-CM | POA: Diagnosis not present

## 2023-01-21 DIAGNOSIS — Z79899 Other long term (current) drug therapy: Secondary | ICD-10-CM | POA: Diagnosis not present

## 2023-01-21 DIAGNOSIS — E785 Hyperlipidemia, unspecified: Secondary | ICD-10-CM | POA: Diagnosis not present

## 2023-01-25 DIAGNOSIS — G301 Alzheimer's disease with late onset: Secondary | ICD-10-CM | POA: Diagnosis not present

## 2023-02-09 DIAGNOSIS — M810 Age-related osteoporosis without current pathological fracture: Secondary | ICD-10-CM | POA: Diagnosis not present

## 2023-02-09 DIAGNOSIS — L600 Ingrowing nail: Secondary | ICD-10-CM | POA: Diagnosis not present

## 2023-02-09 DIAGNOSIS — R262 Difficulty in walking, not elsewhere classified: Secondary | ICD-10-CM | POA: Diagnosis not present

## 2023-02-09 DIAGNOSIS — R238 Other skin changes: Secondary | ICD-10-CM | POA: Diagnosis not present

## 2023-02-09 DIAGNOSIS — M722 Plantar fascial fibromatosis: Secondary | ICD-10-CM | POA: Diagnosis not present

## 2023-02-09 DIAGNOSIS — B351 Tinea unguium: Secondary | ICD-10-CM | POA: Diagnosis not present

## 2023-02-09 DIAGNOSIS — M6281 Muscle weakness (generalized): Secondary | ICD-10-CM | POA: Diagnosis not present

## 2023-02-14 DIAGNOSIS — M1990 Unspecified osteoarthritis, unspecified site: Secondary | ICD-10-CM | POA: Diagnosis not present

## 2023-02-14 DIAGNOSIS — R7301 Impaired fasting glucose: Secondary | ICD-10-CM | POA: Diagnosis not present

## 2023-02-23 DIAGNOSIS — G301 Alzheimer's disease with late onset: Secondary | ICD-10-CM | POA: Diagnosis not present

## 2023-03-14 DIAGNOSIS — K5904 Chronic idiopathic constipation: Secondary | ICD-10-CM | POA: Diagnosis not present

## 2023-03-14 DIAGNOSIS — M1990 Unspecified osteoarthritis, unspecified site: Secondary | ICD-10-CM | POA: Diagnosis not present

## 2023-03-17 ENCOUNTER — Other Ambulatory Visit: Payer: Self-pay

## 2023-03-17 ENCOUNTER — Emergency Department (HOSPITAL_COMMUNITY)
Admission: EM | Admit: 2023-03-17 | Discharge: 2023-03-17 | Disposition: A | Discharge status: Home / Self Care | Payer: Medicare Other | Attending: Emergency Medicine | Admitting: Emergency Medicine

## 2023-03-17 ENCOUNTER — Encounter (HOSPITAL_COMMUNITY): Payer: Self-pay

## 2023-03-17 ENCOUNTER — Emergency Department (HOSPITAL_COMMUNITY): Payer: Medicare Other

## 2023-03-17 DIAGNOSIS — M545 Low back pain, unspecified: Secondary | ICD-10-CM | POA: Diagnosis not present

## 2023-03-17 DIAGNOSIS — R531 Weakness: Secondary | ICD-10-CM | POA: Diagnosis not present

## 2023-03-17 DIAGNOSIS — Z743 Need for continuous supervision: Secondary | ICD-10-CM | POA: Diagnosis not present

## 2023-03-17 DIAGNOSIS — F0390 Unspecified dementia without behavioral disturbance: Secondary | ICD-10-CM | POA: Diagnosis not present

## 2023-03-17 DIAGNOSIS — W010XXA Fall on same level from slipping, tripping and stumbling without subsequent striking against object, initial encounter: Secondary | ICD-10-CM | POA: Insufficient documentation

## 2023-03-17 DIAGNOSIS — M7918 Myalgia, other site: Secondary | ICD-10-CM | POA: Diagnosis not present

## 2023-03-17 DIAGNOSIS — S8990XA Unspecified injury of unspecified lower leg, initial encounter: Secondary | ICD-10-CM | POA: Diagnosis not present

## 2023-03-17 DIAGNOSIS — W19XXXA Unspecified fall, initial encounter: Secondary | ICD-10-CM | POA: Diagnosis not present

## 2023-03-17 DIAGNOSIS — M25552 Pain in left hip: Secondary | ICD-10-CM | POA: Diagnosis not present

## 2023-03-17 DIAGNOSIS — Z043 Encounter for examination and observation following other accident: Secondary | ICD-10-CM | POA: Diagnosis not present

## 2023-03-17 MED ORDER — ACETAMINOPHEN 500 MG PO TABS: 1000.0000 mg | ORAL_TABLET | Freq: Once | ORAL | Status: DC

## 2023-03-17 NOTE — ED Notes (Signed)
Pt walked to the bathroom and back to room with a normal gait

## 2023-03-17 NOTE — ED Triage Notes (Signed)
EMS reports from [facility], witnessed slip and fall, landed on buttocks. No LOC, blood thinners, head strike or obvious injuries. Pt c/o buttock and left leg pain. Hx of Dementia, at baseline (confusion).  BP 124/82 HR 72 RR 16 Sp02 99 CBG 111

## 2023-03-17 NOTE — ED Provider Notes (Signed)
[place] EMERGENCY DEPARTMENT AT [facility] Provider Note  CSN: [id number] Arrival date & time: 03/17/23 1715  Chief Complaint(s) Fall  HPI Brenda Hammond is a 89 y.o. female with history of dementia, A-fib, not on anticoagulation, presenting to the emergency department after witnessed fall.  Patient unwitnessed slip and fall at [facility].  She reportedly fell on her buttocks, did not hit her head patient reports some small pain around her left buttocks region.  She does not know why she is in the hospital..  History limited due to dementia.   Past Medical History Past Medical History:  Diagnosis Date   Anemia    Anxiety    Atrial fibrillation (HCC)    Colon adenocarcinoma (HCC)    DJD (degenerative joint disease)    Fibromyalgia    GERD (gastroesophageal reflux disease)    History of poliomyelitis    History of UTI    Hyperlipemia    Memory disorder 03/01/2019   Other drug allergy(995.27)    Scoliosis    Surgical or other procedure not carried out because of patient's decision    Venous insufficiency    Patient Active Problem List   Diagnosis Date Noted   Syncope 07/13/2022   IFG (impaired fasting glucose) 04/23/2021   Age-related osteoporosis without current pathological fracture 04/23/2021   Syncope and collapse 07/03/2020   Memory disorder 03/01/2019   Medicare annual wellness visit, subsequent 03/21/2017   Routine adult health maintenance 03/21/2017   History of atrial fibrillation 11/26/2015   Edema 11/26/2015   Right leg swelling 02/06/2014   LBP (low back pain) 12/06/2012   Osteopenia 12/06/2012   Vitamin D deficiency 12/06/2012   Malignant neoplasm of colon (HCC) 09/08/2009   ANEMIA 09/08/2009   Venous (peripheral) insufficiency 09/08/2009   GERD 09/08/2009   UTI 09/08/2009   Osteoarthritis 09/08/2009   OTHER DRUG ALLERGY 09/08/2009   ATRIAL FIBRILLATION 08/21/2009   Hyperlipidemia 10/30/2008   Anxiety state 10/30/2008    Fibromyalgia 10/30/2008   SCOLIOSIS 10/30/2008   POLIOMYELITIS, HX OF 10/30/2008   Home Medication(s) Prior to Admission medications   Medication Sig Start Date End Date Taking? Authorizing Provider  alendronate (FOSAMAX) 70 MG tablet TAKE 1 TABLET(70 MG) BY MOUTH EVERY 7 DAYS WITH A FULL GLASS OF WATER AND ON AN EMPTY STOMACH Patient not taking: Reported on 07/14/2022 09/03/21   Mliss Sax, MD  bisacodyl 5 MG EC tablet Take 5 mg by mouth daily as needed (for constipation).    [provider]  lidocaine (XYLOCAINE) 2 % solution Use as directed 1 mL in the mouth or throat See admin instructions. Rinse with 1 ml in the mouth every six hours as needed for sores. Rinse, then spit.    [provider]  loperamide (IMODIUM) 2 MG capsule Take 1 capsule (2 mg total) by mouth 4 (four) times daily as needed for diarrhea or loose stools. Patient not taking: Reported on 10/27/2022 10/06/22   Melene Plan, DO  loratadine (CLARITIN) 10 MG tablet Take 10 mg by mouth daily as needed for allergies.    [provider]  ondansetron (ZOFRAN-ODT) 4 MG disintegrating tablet 4mg  ODT q4 hours prn nausea/vomit Patient not taking: Reported on 10/27/2022 10/06/22   Melene Plan, DO  PSYLLIUM PO Take 0.4 g by mouth at bedtime.    [provider]  TYLENOL 8 HOUR ARTHRITIS PAIN 650 MG CR tablet Take 650 mg by mouth every 12 (twelve) hours as needed for pain.    [provider]                                                                                                                                    Past Surgical History Past Surgical History:  Procedure Laterality Date   APPENDECTOMY     LOW ANTERIOR BOWEL RESECTION  06/2009   with anastomosis for colon cancer   OTHER SURGICAL HISTORY     T & A   TOTAL ABDOMINAL HYSTERECTOMY W/ BILATERAL SALPINGOOPHORECTOMY  1980s   Family History Family History  Problem Relation Age of Onset   Stroke Father    Cancer Mother         brain   Colon cancer Neg Hx     Social History Social History   Tobacco Use   Smoking status: Never   Smokeless tobacco: Never  Substance Use Topics   Alcohol use: No   Drug use: No   Allergies Sulfa antibiotics, Alendronate, Ampicillin, Aspirin, Cyclobenzaprine hcl, Metaxalone, Nitrofurantoin, Sulfonamide derivatives, Aricept [donepezil], Nortriptyline hcl, Nsaids, and Penicillins  Review of Systems Review of Systems  All other systems reviewed and are negative.   Physical Exam Vital Signs  I have reviewed the triage vital signs BP (!) 123/105   Pulse 63   Temp 97.8 F (36.6 C) (Oral)   Resp 16   SpO2 98%  Physical Exam Vitals and nursing note reviewed.  Constitutional:      General: She is not in acute distress.    Appearance: She is well-developed.  HENT:     Head: Normocephalic and atraumatic.     Mouth/Throat:     Mouth: Mucous membranes are moist.  Eyes:     Pupils: Pupils are equal, round, and reactive to light.  Cardiovascular:     Rate and Rhythm: Normal rate and regular rhythm.     Heart sounds: No murmur heard. Pulmonary:     Effort: Pulmonary effort is normal. No respiratory distress.     Breath sounds: Normal breath sounds.  Abdominal:     General: Abdomen is flat.     Palpations: Abdomen is soft.     Tenderness: There is no abdominal tenderness.  Musculoskeletal:        General: No tenderness.     Right lower leg: No edema.     Left lower leg: No edema.     Comments: No midline C, T, L-spine tenderness.  Full range of motion of bilateral upper and lower extremities with no focal tenderness or deformity.  Specifically normal range of motion of the hip with no tenderness.  No bony tenderness otherwise.  No chest wall tenderness.  Skin:    General: Skin is warm and dry.  Neurological:     General: No focal deficit present.     Mental Status: She is alert. Mental status is at baseline.     Comments: Oriented to self, knows she is in the  hospital, not sure why she is here or the year.  Follows commands in all 4 extremities.  Psychiatric:        Mood and Affect: Mood normal.        Behavior: Behavior normal.     ED Results and Treatments Labs (all labs ordered are listed, but only abnormal results are displayed) Labs Reviewed - No data to display                                                                                                                        Radiology DG Hip Unilat With Pelvis 2-3 Views Left  Result Date: 03/17/2023 CLINICAL DATA:  Fall EXAM: DG HIP (WITH OR WITHOUT PELVIS) 2-3V LEFT COMPARISON:  Left hip x-ray 07/02/2020 FINDINGS: There is no evidence of hip fracture or dislocation. There is no evidence of arthropathy or other focal bone abnormality. Surgical staple line is seen in the pelvis. IMPRESSION: Negative. Electronically Signed   By: Darliss Cheney M.D.   On: 03/17/2023 20:12    Pertinent labs & imaging results that were available during my care of the patient were reviewed by me and considered in my medical decision making (see MDM for details).  Medications Ordered in ED Medications  acetaminophen (TYLENOL) tablet 1,000 mg (1,000 mg Oral Patient Refused/Not Given 03/17/23 1837)                                                                                                                                     Procedures Procedures  (including critical care time)  Medical Decision Making / ED Course   MDM:  89 year old presenting after fall onto her bottom.  Patient overall very well-appearing, physical exam with no focal deformity or injury.  No signs of head trauma.  He does not really have any tenderness and is able to range her hips normally.  She does report some pain in her bottom.  Will obtain x-ray of the pelvis and hip to further evaluate.  Will give Tylenol.  Will attempt ambulation.  If patient able to ambulate anticipate discharge back to the facility.  Clinical Course as  of 03/17/23 2310  Thu Mar 17, 2023  2309 Patient was able to ambulate to the bathroom without difficulty.  Discussed with patient's son-in-law.  Will discharge patient back to facility.  X-ray hip negative.  Patient denies any other complaints at this time. Will  discharge patient to home. All questions answered. Patient comfortable with plan of discharge. Return precautions discussed with patient and specified on the after visit summary.  [WS]    Clinical Course User Index [WS] Lonell Grandchild, MD     Additional history obtained: -Additional history obtained from ems -External records from outside source obtained and reviewed including: Chart review including previous notes, labs, imaging, consultation notes including prior ER notes       Imaging Studies ordered: I ordered imaging studies including XR hip On my interpretation imaging demonstrates no fracture I independently visualized and interpreted imaging. I agree with the radiologist interpretation   Medicines ordered and prescription drug management: Meds ordered this encounter  Medications   acetaminophen (TYLENOL) tablet 1,000 mg    -I have reviewed the patients home medicines and have made adjustments as needed  Reevaluation: After the interventions noted above, I reevaluated the patient and found that their symptoms have improved  Co morbidities that complicate the patient evaluation  Past Medical History:  Diagnosis Date   Anemia    Anxiety    Atrial fibrillation (HCC)    Colon adenocarcinoma (HCC)    DJD (degenerative joint disease)    Fibromyalgia    GERD (gastroesophageal reflux disease)    History of poliomyelitis    History of UTI    Hyperlipemia    Memory disorder 03/01/2019   Other drug allergy(995.27)    Scoliosis    Surgical or other procedure not carried out because of patient's decision    Venous insufficiency       Dispostion: Disposition decision including need for hospitalization was  considered, and patient discharged from emergency department.    Final Clinical Impression(s) / ED Diagnoses Final diagnoses:  Fall, initial encounter     This chart was dictated using voice recognition software.  Despite best efforts to proofread,  errors can occur which can change the documentation meaning.    Lonell Grandchild, MD 03/17/23 [phone]

## 2023-03-17 NOTE — Discharge Instructions (Addendum)
We evaluated Brenda Hammond after her fall.  We did not see any dangerous injuries on my evaluation in the emergency department.  Please bring her back if she develops any new symptoms such as trouble walking, headaches, confusion, weakness, or any other new symptoms.

## 2023-03-17 NOTE — ED Notes (Signed)
Attempted to call staff at [facility], however no answer at this time, not able to LVM for staff to call back  [facility] called to arrange transport back to facility

## 2023-03-30 DIAGNOSIS — M1990 Unspecified osteoarthritis, unspecified site: Secondary | ICD-10-CM | POA: Diagnosis not present

## 2023-03-30 DIAGNOSIS — K5904 Chronic idiopathic constipation: Secondary | ICD-10-CM | POA: Diagnosis not present

## 2023-04-14 DIAGNOSIS — M6281 Muscle weakness (generalized): Secondary | ICD-10-CM | POA: Diagnosis not present

## 2023-04-14 DIAGNOSIS — M810 Age-related osteoporosis without current pathological fracture: Secondary | ICD-10-CM | POA: Diagnosis not present

## 2023-04-14 DIAGNOSIS — L600 Ingrowing nail: Secondary | ICD-10-CM | POA: Diagnosis not present

## 2023-04-14 DIAGNOSIS — M722 Plantar fascial fibromatosis: Secondary | ICD-10-CM | POA: Diagnosis not present

## 2023-04-14 DIAGNOSIS — B351 Tinea unguium: Secondary | ICD-10-CM | POA: Diagnosis not present

## 2023-04-14 DIAGNOSIS — R262 Difficulty in walking, not elsewhere classified: Secondary | ICD-10-CM | POA: Diagnosis not present

## 2023-04-14 DIAGNOSIS — I872 Venous insufficiency (chronic) (peripheral): Secondary | ICD-10-CM | POA: Diagnosis not present

## 2023-04-14 DIAGNOSIS — R238 Other skin changes: Secondary | ICD-10-CM | POA: Diagnosis not present

## 2023-04-18 ENCOUNTER — Other Ambulatory Visit: Payer: Self-pay

## 2023-04-18 ENCOUNTER — Emergency Department (HOSPITAL_COMMUNITY): Payer: Medicare Other

## 2023-04-18 ENCOUNTER — Observation Stay (HOSPITAL_COMMUNITY)
Admission: EM | Admit: 2023-04-18 | Discharge: 2023-04-19 | Disposition: A | Discharge status: Skilled Nursing Facility | Payer: Medicare Other | Attending: Internal Medicine | Admitting: Internal Medicine

## 2023-04-18 DIAGNOSIS — F0390 Unspecified dementia without behavioral disturbance: Secondary | ICD-10-CM | POA: Insufficient documentation

## 2023-04-18 DIAGNOSIS — R911 Solitary pulmonary nodule: Secondary | ICD-10-CM | POA: Diagnosis not present

## 2023-04-18 DIAGNOSIS — R918 Other nonspecific abnormal finding of lung field: Secondary | ICD-10-CM | POA: Diagnosis not present

## 2023-04-18 DIAGNOSIS — I480 Paroxysmal atrial fibrillation: Secondary | ICD-10-CM | POA: Diagnosis not present

## 2023-04-18 DIAGNOSIS — I672 Cerebral atherosclerosis: Secondary | ICD-10-CM | POA: Diagnosis not present

## 2023-04-18 DIAGNOSIS — I2510 Atherosclerotic heart disease of native coronary artery without angina pectoris: Secondary | ICD-10-CM | POA: Diagnosis not present

## 2023-04-18 DIAGNOSIS — Z809 Family history of malignant neoplasm, unspecified: Secondary | ICD-10-CM | POA: Diagnosis not present

## 2023-04-18 DIAGNOSIS — F419 Anxiety disorder, unspecified: Secondary | ICD-10-CM | POA: Diagnosis not present

## 2023-04-18 DIAGNOSIS — R109 Unspecified abdominal pain: Secondary | ICD-10-CM | POA: Diagnosis not present

## 2023-04-18 DIAGNOSIS — D519 Vitamin B12 deficiency anemia, unspecified: Secondary | ICD-10-CM | POA: Insufficient documentation

## 2023-04-18 DIAGNOSIS — R531 Weakness: Secondary | ICD-10-CM

## 2023-04-18 DIAGNOSIS — E785 Hyperlipidemia, unspecified: Secondary | ICD-10-CM | POA: Insufficient documentation

## 2023-04-18 DIAGNOSIS — R7301 Impaired fasting glucose: Secondary | ICD-10-CM | POA: Diagnosis not present

## 2023-04-18 DIAGNOSIS — K219 Gastro-esophageal reflux disease without esophagitis: Secondary | ICD-10-CM | POA: Insufficient documentation

## 2023-04-18 DIAGNOSIS — I700 Atherosclerosis of aorta: Secondary | ICD-10-CM | POA: Diagnosis not present

## 2023-04-18 DIAGNOSIS — M6281 Muscle weakness (generalized): Secondary | ICD-10-CM | POA: Diagnosis not present

## 2023-04-18 DIAGNOSIS — R404 Transient alteration of awareness: Principal | ICD-10-CM

## 2023-04-18 DIAGNOSIS — R55 Syncope and collapse: Principal | ICD-10-CM | POA: Diagnosis present

## 2023-04-18 DIAGNOSIS — K5904 Chronic idiopathic constipation: Secondary | ICD-10-CM | POA: Diagnosis not present

## 2023-04-18 DIAGNOSIS — Z743 Need for continuous supervision: Secondary | ICD-10-CM | POA: Diagnosis not present

## 2023-04-18 DIAGNOSIS — R1111 Vomiting without nausea: Secondary | ICD-10-CM | POA: Diagnosis not present

## 2023-04-18 LAB — URINALYSIS, W/ REFLEX TO CULTURE (INFECTION SUSPECTED)
Bilirubin Urine: NEGATIVE
Glucose, UA: NEGATIVE mg/dL
Hgb urine dipstick: NEGATIVE
Ketones, ur: NEGATIVE mg/dL
Nitrite: NEGATIVE
Protein, ur: NEGATIVE mg/dL
Specific Gravity, Urine: 1.016 (ref 1.005–1.030)
pH: 7 (ref 5.0–8.0)

## 2023-04-18 LAB — BASIC METABOLIC PANEL
Anion gap: 11 (ref 5–15)
BUN: 18 mg/dL (ref 8–23)
CO2: 26 mmol/L (ref 22–32)
Calcium: 8.9 mg/dL (ref 8.9–10.3)
Chloride: 102 mmol/L (ref 98–111)
Creatinine, Ser: 1.23 mg/dL — ABNORMAL HIGH (ref 0.44–1.00)
GFR, Estimated: 42 mL/min — ABNORMAL LOW (ref >60)
Glucose, Bld: 138 mg/dL — ABNORMAL HIGH (ref 70–99)
Potassium: 4 mmol/L (ref 3.5–5.1)
Sodium: 139 mmol/L (ref 135–145)

## 2023-04-18 LAB — CBC WITH DIFFERENTIAL/PLATELET
Abs Immature Granulocytes: 0.06 10*3/uL (ref 0.00–0.07)
Basophils Absolute: 0.1 10*3/uL (ref 0.0–0.1)
Basophils Relative: 1 %
Eosinophils Absolute: 0.3 10*3/uL (ref 0.0–0.5)
Eosinophils Relative: 3 %
HCT: 37.7 % (ref 36.0–46.0)
Hemoglobin: 11.9 g/dL — ABNORMAL LOW (ref 12.0–15.0)
Immature Granulocytes: 1 %
Lymphocytes Relative: 10 %
Lymphs Abs: 1 10*3/uL (ref 0.7–4.0)
MCH: 28.5 pg (ref 26.0–34.0)
MCHC: 31.6 g/dL (ref 30.0–36.0)
MCV: 90.4 fL (ref 80.0–100.0)
Monocytes Absolute: 0.9 10*3/uL (ref 0.1–1.0)
Monocytes Relative: 9 %
Neutro Abs: 7.5 10*3/uL (ref 1.7–7.7)
Neutrophils Relative %: 76 %
Platelets: 242 10*3/uL (ref 150–400)
RBC: 4.17 MIL/uL (ref 3.87–5.11)
RDW: 13.3 % (ref 11.5–15.5)
WBC: 9.8 10*3/uL (ref 4.0–10.5)
nRBC: 0 % (ref 0.0–0.2)

## 2023-04-18 LAB — HEPATIC FUNCTION PANEL
ALT: 17 U/L (ref 0–44)
AST: 25 U/L (ref 15–41)
Albumin: 3.3 g/dL — ABNORMAL LOW (ref 3.5–5.0)
Alkaline Phosphatase: 97 U/L (ref 38–126)
Bilirubin, Direct: <0.1 mg/dL (ref 0.0–0.2)
Total Bilirubin: 0.7 mg/dL (ref 0.3–1.2)
Total Protein: 6.7 g/dL (ref 6.5–8.1)

## 2023-04-18 LAB — I-STAT CHEM 8, ED
BUN: 18 mg/dL (ref 8–23)
Calcium, Ion: 1.23 mmol/L (ref 1.15–1.40)
Chloride: 103 mmol/L (ref 98–111)
Creatinine, Ser: 1.1 mg/dL — ABNORMAL HIGH (ref 0.44–1.00)
Glucose, Bld: 105 mg/dL — ABNORMAL HIGH (ref 70–99)
HCT: 37 % (ref 36.0–46.0)
Hemoglobin: 12.6 g/dL (ref 12.0–15.0)
Potassium: 4.3 mmol/L (ref 3.5–5.1)
Sodium: 140 mmol/L (ref 135–145)
TCO2: 25 mmol/L (ref 22–32)

## 2023-04-18 LAB — LIPASE, BLOOD: Lipase: 43 U/L (ref 11–51)

## 2023-04-18 LAB — CK: Total CK: 51 U/L (ref 38–234)

## 2023-04-18 LAB — I-STAT CG4 LACTIC ACID, ED: Lactic Acid, Venous: 0.6 mmol/L (ref 0.5–1.9)

## 2023-04-18 LAB — MAGNESIUM: Magnesium: 2.1 mg/dL (ref 1.7–2.4)

## 2023-04-18 LAB — TROPONIN I (HIGH SENSITIVITY)
Troponin I (High Sensitivity): 7 ng/L (ref <18)
Troponin I (High Sensitivity): 6 ng/L (ref <18)

## 2023-04-18 LAB — CBG MONITORING, ED: Glucose-Capillary: 130 mg/dL — ABNORMAL HIGH (ref 70–99)

## 2023-04-18 MED ORDER — LACTATED RINGERS IV BOLUS
500.0000 mL | Freq: Once | INTRAVENOUS | Status: AC
  Administered 2023-04-18: 500 mL via INTRAVENOUS

## 2023-04-18 MED ORDER — ACETAMINOPHEN 325 MG PO TABS: 650.0000 mg | ORAL_TABLET | Freq: Four times a day (QID) | ORAL | Status: DC | PRN

## 2023-04-18 MED ORDER — MELATONIN 3 MG PO TABS: 3.0000 mg | ORAL_TABLET | Freq: Every evening | ORAL | Status: DC | PRN

## 2023-04-18 MED ORDER — IOHEXOL 350 MG/ML SOLN
75.0000 mL | Freq: Once | INTRAVENOUS | Status: AC | PRN
  Administered 2023-04-18: 75 mL via INTRAVENOUS

## 2023-04-18 MED ORDER — FENTANYL CITRATE PF 50 MCG/ML IJ SOSY
25.0000 ug | PREFILLED_SYRINGE | Freq: Once | INTRAMUSCULAR | Status: AC
  Administered 2023-04-18: 25 ug via INTRAVENOUS
  Filled 2023-04-18 (×2): qty 1

## 2023-04-18 MED ORDER — ONDANSETRON HCL 4 MG/2ML IJ SOLN: 4.0000 mg | Freq: Four times a day (QID) | INTRAMUSCULAR | Status: DC | PRN

## 2023-04-18 MED ORDER — ACETAMINOPHEN 650 MG RE SUPP: 650.0000 mg | Freq: Four times a day (QID) | RECTAL | Status: DC | PRN

## 2023-04-18 NOTE — ED Provider Notes (Signed)
[place] [facility]GENCY DEPARTMENT AT [facility] Provider Note   CSN: [id number] Arrival date & time: 04/18/23  1242     History  Chief Complaint  Patient presents with   Loss of Consciousness   Nausea    Brenda Hammond is a 89 y.o. female.   Loss of Consciousness Associated symptoms: vomiting   Patient presents for syncopal episode.  Medical history includes HLD, anemia, atrial fibrillation, GERD, arthritis, fibromyalgia, colon cancer, dementia.  She resides in skilled nursing facility.  This morning, she was in her normal state of health.  At baseline, she ambulates without any difficulty.  While seated in a chair, she had a loss of consciousness.  Estimated duration was 3 minutes.  Afterwards, she had several episodes of vomiting.  She endorsed lower abdominal pain.  She had a loose brown bowel movement.  Staff reported that she did not seem her normal self after this episode.  Patient's daughter, who accompanies are at bedside, states that she is currently back to her mental baseline.  She does seem weak.  Patient endorses continued lower abdominal discomfort.     Home Medications Prior to Admission medications   Medication Sig Start Date End Date Taking? Authorizing Provider  bisacodyl 5 MG EC tablet Take 5 mg by mouth daily as needed (for constipation).   Yes [provider]  lidocaine (XYLOCAINE) 2 % solution Use as directed 1 mL in the mouth or throat See admin instructions. Rinse with 1 ml in the mouth every six hours as needed for sores. Rinse, then spit.   Yes [provider]  loratadine (CLARITIN) 10 MG tablet Take 10 mg by mouth daily as needed for allergies.   Yes [provider]  PSYLLIUM PO Take 0.4 g by mouth at bedtime.   Yes [provider]  TYLENOL 8 HOUR ARTHRITIS PAIN 650 MG CR tablet Take 650 mg by mouth every 12 (twelve) hours as needed for pain.   Yes [provider]      Allergies    Sulfa  antibiotics, Alendronate, Ampicillin, Aspirin, Cyclobenzaprine hcl, Metaxalone, Nitrofurantoin, Sulfonamide derivatives, Aricept [donepezil], Nortriptyline hcl, Nsaids, and Penicillins    Review of Systems   Review of Systems  Unable to perform ROS: Dementia  Cardiovascular:  Positive for syncope.  Gastrointestinal:  Positive for abdominal pain, diarrhea and vomiting.  Neurological:  Positive for syncope.  All other systems reviewed and are negative.   Physical Exam Updated Vital Signs BP 139/72   Pulse 68   Temp 97.7 F (36.5 C)   Resp 15   SpO2 100%  Physical Exam Vitals and nursing note reviewed.  Constitutional:      General: She is not in acute distress.    Appearance: Normal appearance. She is well-developed and normal weight. She is not ill-appearing, toxic-appearing or diaphoretic.  HENT:     Head: Normocephalic and atraumatic.     Right Ear: External ear normal.     Left Ear: External ear normal.     Mouth/Throat:     Mouth: Mucous membranes are moist.  Eyes:     Extraocular Movements: Extraocular movements intact.     Conjunctiva/sclera: Conjunctivae normal.  Cardiovascular:     Rate and Rhythm: Normal rate and regular rhythm.     Heart sounds: No murmur heard. Pulmonary:     Effort: Pulmonary effort is normal. No respiratory distress.     Breath sounds: Normal breath sounds. No wheezing or rales.  Chest:  Chest wall: No tenderness.  Abdominal:     General: There is no distension.     Palpations: Abdomen is soft.     Tenderness: There is abdominal tenderness. There is no guarding or rebound.  Musculoskeletal:        General: No swelling. Normal range of motion.     Cervical back: Normal range of motion and neck supple.     Right lower leg: No edema.     Left lower leg: No edema.  Skin:    General: Skin is warm and dry.     Coloration: Skin is pale (Baseline color, per daughter). Skin is not jaundiced.  Neurological:     General: No focal deficit  present.     Mental Status: She is alert. Mental status is at baseline. She is disoriented.  Psychiatric:        Mood and Affect: Mood normal.        Behavior: Behavior normal.     ED Results / Procedures / Treatments   Labs (all labs ordered are listed, but only abnormal results are displayed) Labs Reviewed  BASIC METABOLIC PANEL - Abnormal; Notable for the following components:      Result Value   Glucose, Bld 138 (*)    Creatinine, Ser 1.23 (*)    GFR, Estimated 42 (*)    All other components within normal limits  CBC WITH DIFFERENTIAL/PLATELET - Abnormal; Notable for the following components:   Hemoglobin 11.9 (*)    All other components within normal limits  HEPATIC FUNCTION PANEL - Abnormal; Notable for the following components:   Albumin 3.3 (*)    All other components within normal limits  CBG MONITORING, ED - Abnormal; Notable for the following components:   Glucose-Capillary 130 (*)    All other components within normal limits  I-STAT CHEM 8, ED - Abnormal; Notable for the following components:   Creatinine, Ser 1.10 (*)    Glucose, Bld 105 (*)    All other components within normal limits  LIPASE, BLOOD  MAGNESIUM  CK  URINALYSIS, W/ REFLEX TO CULTURE (INFECTION SUSPECTED)  I-STAT CG4 LACTIC ACID, ED  I-STAT CG4 LACTIC ACID, ED  TROPONIN I (HIGH SENSITIVITY)  TROPONIN I (HIGH SENSITIVITY)    EKG EKG Interpretation Date/Time:  Monday April 18 2023 12:32:18 EDT Ventricular Rate:  66 PR Interval:  114 QRS Duration:  88 QT Interval:  394 QTC Calculation: 413 R Axis:   -10  Text Interpretation: Normal sinus rhythm Confirmed by Gloris Manchester ([id number] on 04/18/2023 1:44:03 PM  Radiology No results found.  Procedures Procedures    Medications Ordered in ED Medications  ondansetron (ZOFRAN) injection 4 mg (has no administration in time range)  fentaNYL (SUBLIMAZE) injection 25 mcg (has no administration in time range)  lactated ringers bolus 500 mL (0 mLs  Intravenous Stopped 04/18/23 1419)  lactated ringers bolus 500 mL (0 mLs Intravenous Stopped 04/18/23 1552)  iohexol (OMNIPAQUE) 350 MG/ML injection 75 mL (75 mLs Intravenous Contrast Given 04/18/23 1615)    ED Course/ Medical Decision Making/ A&P                                 Medical Decision Making Amount and/or Complexity of Data Reviewed Labs: ordered. Radiology: ordered. ECG/medicine tests: ordered.  Risk Prescription drug management.   This patient presents to the ED for concern of syncope, this involves an extensive number of treatment options, and  is a complaint that carries with it a high risk of complications and morbidity.  The differential diagnosis includes CVA, TIA, seizure, arrhythmia, vasovagal episode, metabolic derangements   Co morbidities that complicate the patient evaluation  HLD, anemia, atrial fibrillation, GERD, arthritis, fibromyalgia, colon cancer, dementia   Additional history obtained:  Additional history obtained from patient's daughter External records from outside source obtained and reviewed including EMR   Lab Tests:  I Ordered, and personally interpreted labs.  The pertinent results include: Normal hemoglobin, no leukocytosis, creatinine slightly increased from baseline, normal electrolytes, normal troponin   Imaging Studies ordered:  I ordered imaging studies including chest x-ray, CT of head, CTA chest, CT of abdomen and pelvis I independently visualized and interpreted imaging which showed (pending at time of signout) I agree with the radiologist interpretation   Cardiac Monitoring: / EKG:  The patient was maintained on a cardiac monitor.  I personally viewed and interpreted the cardiac monitored which showed an underlying rhythm of: Sinus rhythm  Problem List / ED Course / Critical interventions / Medication management  Patient presenting after syncopal episode.  On arrival in the ED, she is awake and alert.  Daughter  accompanies her at bedside and reports that she is currently at her mental baseline.  Patient endorses lower abdominal pain.  Tenderness is present.  Inspection of diaper area was done with nurse chaperone present.  No prolapses or areas of swelling are present.  Patient was placed on bedside cardiac monitor.  Broad workup was initiated.  Lab work is notable for increase in creatinine from baseline.  Additional IV fluids were ordered.  CT imaging was pending at time of signout.  Care of patient was signed out to oncoming ED provider. I ordered medication including IV fluids for hydration; Zofran for nausea; fentanyl for analgesia Reevaluation of the patient after these medicines showed that the patient improved I have reviewed the patients home medicines and have made adjustments as needed   Social Determinants of Health:  Resides in nursing facility        Final Clinical Impression(s) / ED Diagnoses Final diagnoses:  Episode of unresponsiveness    Rx / DC Orders ED Discharge Orders     None         Gloris Manchester, MD 04/18/23 1620

## 2023-04-18 NOTE — H&P (Signed)
History and Physical      Brenda Hammond QIH:[id number] DOB: [birth year] DOA: 04/18/2023; DOS: 04/18/2023  PCP: Brenda Jenny, MD *** Patient coming from: home ***  I have personally briefly reviewed patient's old medical records in [facility]  Chief Complaint: ***  HPI: Brenda Hammond is a 89 y.o. female with medical history significant for *** who is admitted to [facility] on 04/18/2023 with *** after presenting from home*** to [facility] ED complaining of ***.    ***       ***   ED Course:  Vital signs in the ED were notable for the following: ***  Labs were notable for the following: ***  Per my interpretation, EKG in ED demonstrated the following:  ***  Imaging in the ED, per corresponding formal radiology read, was notable for the following:  ***  While in the ED, the following were administered: ***  Subsequently, the patient was admitted  ***  ***red    Review of Systems: As per HPI otherwise 10 point review of systems negative.   Past Medical History:  Diagnosis Date   Anemia    Anxiety    Atrial fibrillation (HCC)    Colon adenocarcinoma (HCC)    DJD (degenerative joint disease)    Fibromyalgia    GERD (gastroesophageal reflux disease)    History of poliomyelitis    History of UTI    Hyperlipemia    Memory disorder 03/01/2019   Other drug allergy(995.27)    Scoliosis    Surgical or other procedure not carried out because of patient's decision    Venous insufficiency     Past Surgical History:  Procedure Laterality Date   APPENDECTOMY     LOW ANTERIOR BOWEL RESECTION  06/2009   with anastomosis for colon cancer   OTHER SURGICAL HISTORY     T & A   TOTAL ABDOMINAL HYSTERECTOMY W/ BILATERAL SALPINGOOPHORECTOMY  1980s    Social History:  reports that she has never smoked. She has never used smokeless tobacco. She reports that she does not drink alcohol and does not use drugs.   Allergies  Allergen Reactions    Sulfa Antibiotics Anaphylaxis, Hypertension and Other (See Comments)    Elevated HR and BP, also and "ALLERGIC," per MAR  REACTION: elevated HR and BP   Alendronate Other (See Comments)    "twitching" and "ALLERGIC," per MAR   Ampicillin Other (See Comments)    High fever and "ALLERGIC," per [facility]   Aspirin Other (See Comments)    Made the ears ring and "ALLERGIC," per MAR   Cyclobenzaprine Hcl Other (See Comments)    Nightmares- "ALLERGIC," per [facility]   Metaxalone Other (See Comments)    Nightmares- "ALLERGIC," per MAR   Nitrofurantoin Other (See Comments) and Hypertension    Elevated B/P and chest pain- "ALLERGIC," per MAR   Sulfonamide Derivatives Other (See Comments) and Hypertension    Elevated HR and B/P- "ALLERGIC," per MAR   Aricept [Donepezil] Rash and Other (See Comments)    Dizziness and cramping also- "ALLERGIC," per MAR   Nortriptyline Hcl Rash and Other (See Comments)    "ALLERGIC," per MAR   Nsaids Rash and Other (See Comments)    "ALLERGIC," per MAR   Penicillins Rash and Other (See Comments)    High fever, too- "ALLERGIC," per [facility]    Family History  Problem Relation Age of Onset   Stroke Father    Cancer Mother  brain   Colon cancer Neg Hx     Family history reviewed and not pertinent ***   Prior to Admission medications   Medication Sig Start Date End Date Taking? Authorizing Provider  bisacodyl 5 MG EC tablet Take 5 mg by mouth daily as needed (for constipation).   Yes [provider]  lidocaine (XYLOCAINE) 2 % solution Use as directed 1 mL in the mouth or throat See admin instructions. Rinse with 1 ml in the mouth every six hours as needed for sores. Rinse, then spit.   Yes [provider]  loratadine (CLARITIN) 10 MG tablet Take 10 mg by mouth daily as needed for allergies.   Yes [provider]  PSYLLIUM PO Take 0.4 g by mouth at bedtime.   Yes [provider]  TYLENOL 8 HOUR ARTHRITIS PAIN 650 MG CR tablet Take  650 mg by mouth every 12 (twelve) hours as needed for pain.   Yes [provider]     Objective    Physical Exam: Vitals:   04/18/23 1830 04/18/23 1900 04/18/23 2030 04/18/23 2053  BP: (!) 171/84 (!) 152/70 (!) 160/81   Pulse: 70 67 75   Resp: 19 14 17    Temp:    97.8 F (36.6 C)  TempSrc:      SpO2: 100% 98% 99%     General: appears to be stated age; alert, oriented Skin: warm, dry, no rash Head:  AT/[place] Mouth:  Oral mucosa membranes appear moist, normal dentition Neck: supple; trachea midline Heart:  RRR; did not appreciate any M/R/G Lungs: CTAB, did not appreciate any wheezes, rales, or rhonchi Abdomen: + BS; soft, ND, NT Vascular: 2+ pedal pulses b/l; 2+ radial pulses b/l Extremities: no peripheral edema, no muscle wasting Neuro: strength and sensation intact in upper and lower extremities b/l ***   *** Neuro: 5/5 strength of the proximal and distal flexors and extensors of the upper and lower extremities bilaterally; sensation intact in upper and lower extremities b/l; cranial nerves II through XII grossly intact; no pronator drift; no evidence suggestive of slurred speech, dysarthria, or facial droop; Normal muscle tone. No tremors.  *** Neuro: In the setting of the patient's current mental status and associated inability to follow instructions, unable to perform full neurologic exam at this time.  As such, assessment of strength, sensation, and cranial nerves is limited at this time. Patient noted to spontaneously move all 4 extremities. No tremors.  ***    Labs on Admission: I have personally reviewed following labs and imaging studies  CBC: Recent Labs  Lab 04/18/23 1314 04/18/23 1526  WBC 9.8  --   NEUTROABS 7.5  --   HGB 11.9* 12.6  HCT 37.7 37.0  MCV 90.4  --   PLT 242  --    Basic Metabolic Panel: Recent Labs  Lab 04/18/23 1314 04/18/23 1526  NA 139 140  K 4.0 4.3  CL 102 103  CO2 26  --   GLUCOSE 138* 105*  BUN 18 18  CREATININE  1.23* 1.10*  CALCIUM 8.9  --   MG 2.1  --    GFR: CrCl cannot be calculated (Unknown ideal weight.). Liver Function Tests: Recent Labs  Lab 04/18/23 1314  AST 25  ALT 17  ALKPHOS 97  BILITOT 0.7  PROT 6.7  ALBUMIN 3.3*   Recent Labs  Lab 04/18/23 1314  LIPASE 43   No results for input(s): "AMMONIA" in the last 168 hours. Coagulation Profile: No results for  input(s): "INR", "PROTIME" in the last 168 hours. Cardiac Enzymes: Recent Labs  Lab 04/18/23 1314  CKTOTAL 51   BNP (last 3 results) No results for input(s): "PROBNP" in the last 8760 hours. HbA1C: No results for input(s): "HGBA1C" in the last 72 hours. CBG: Recent Labs  Lab 04/18/23 1311  GLUCAP 130*   Lipid Profile: No results for input(s): "CHOL", "HDL", "LDLCALC", "TRIG", "CHOLHDL", "LDLDIRECT" in the last 72 hours. Thyroid Function Tests: No results for input(s): "TSH", "T4TOTAL", "FREET4", "T3FREE", "THYROIDAB" in the last 72 hours. Anemia Panel: No results for input(s): "VITAMINB12", "FOLATE", "FERRITIN", "TIBC", "IRON", "RETICCTPCT" in the last 72 hours. Urine analysis:    Component Value Date/Time   COLORURINE STRAW (A) 04/18/2023 1315   APPEARANCEUR CLEAR 04/18/2023 1315   LABSPEC 1.016 04/18/2023 1315   PHURINE 7.0 04/18/2023 1315   GLUCOSEU NEGATIVE 04/18/2023 1315   GLUCOSEU NEGATIVE 04/23/2021 1458   HGBUR NEGATIVE 04/18/2023 1315   BILIRUBINUR NEGATIVE 04/18/2023 1315   KETONESUR NEGATIVE 04/18/2023 1315   PROTEINUR NEGATIVE 04/18/2023 1315   UROBILINOGEN 0.2 04/23/2021 1458   NITRITE NEGATIVE 04/18/2023 1315   LEUKOCYTESUR SMALL (A) 04/18/2023 1315    Radiological Exams on Admission: CT HEAD WO CONTRAST  Result Date: 04/18/2023 CLINICAL DATA:  Syncope/presyncope, cerebrovascular cause suspected EXAM: CT HEAD WITHOUT CONTRAST TECHNIQUE: Contiguous axial images were obtained from the base of the skull through the vertex without intravenous contrast. RADIATION DOSE REDUCTION: This  exam was performed according to the departmental dose-optimization program which includes automated exposure control, adjustment of the mA and/or kV according to patient size and/or use of iterative reconstruction technique. COMPARISON:  Head CT 10/27/2022 FINDINGS: Brain: No intracranial hemorrhage, mass effect, or midline shift. Stable temporal predominant atrophy and chronic small vessel ischemia. No hydrocephalus. The basilar cisterns are patent. No evidence of territorial infarct or acute ischemia. No extra-axial or intracranial fluid collection. Vascular: Atherosclerosis of skullbase vasculature without hyperdense vessel or abnormal calcification. Skull: No fracture or focal lesion. Sinuses/Orbits: Paranasal sinuses and mastoid air cells are clear. The visualized orbits are unremarkable. Cataract resection Other: None. IMPRESSION: 1. No acute intracranial abnormality. 2. Stable atrophy and chronic small vessel ischemia. Electronically Signed   By: Narda Rutherford M.D.   On: 04/18/2023 19:27   CT Angio Chest PE W and/or Wo Contrast  Result Date: 04/18/2023 CLINICAL DATA:  PE suspected abdominal pain EXAM: CT ANGIOGRAPHY CHEST WITH CONTRAST CT ABDOMEN PELVIS WITH CONTRAST TECHNIQUE: Multidetector CT imaging of the chest was performed using the standard protocol during bolus administration of intravenous contrast. Multiplanar CT image reconstructions and MIPs were obtained to evaluate the vascular anatomy. Multidetector CT imaging of the abdomen and pelvis was performed using the standard protocol following bolus administration of intravenous contrast. RADIATION DOSE REDUCTION: This exam was performed according to the departmental dose-optimization program which includes automated exposure control, adjustment of the mA and/or kV according to patient size and/or use of iterative reconstruction technique. CONTRAST:  75mL OMNIPAQUE IOHEXOL 350 MG/ML SOLN COMPARISON:  None Available. FINDINGS: CT CHEST  ANGIOGRAM FINDINGS Cardiovascular: Satisfactory opacification of the pulmonary arteries to the segmental level. No evidence of pulmonary embolism. Normal heart size. Left coronary artery calcifications. No pericardial effusion. Aortic atherosclerosis. Irregular intramural thrombus within the lower thoracic aorta (series 5, image 13). Mediastinum/Nodes: No enlarged mediastinal, hilar, or axillary lymph nodes. Small calcified granulomatous mediastinal and hilar lymph nodes, sequelae of prior infection inflammation thyroid gland, trachea, and esophagus demonstrate no significant findings. Lungs/Pleura: Diffuse bilateral bronchial wall thickening. Diffuse, fine  centrilobular and ground-glass nodularity throughout the lungs. Bibasilar scarring or atelectasis. No pleural effusion or pneumothorax. Musculoskeletal: No chest wall abnormality. No acute osseous findings. Review of the MIP images confirms the above findings. CT ABDOMEN PELVIS FINDINGS Hepatobiliary: No solid liver abnormality is seen. No gallstones, gallbladder wall thickening, or biliary dilatation. Pancreas: Unremarkable. No pancreatic ductal dilatation or surrounding inflammatory changes. Spleen: Normal in size without significant abnormality. Adrenals/Urinary Tract: Adrenal glands are unremarkable. Kidneys are normal, without renal calculi, solid lesion, or hydronephrosis. Bladder is unremarkable. Stomach/Bowel: Stomach is within normal limits. Appendix appears normal. No evidence of bowel wall thickening, distention, or inflammatory changes. Rectosigmoid colon resection Vascular/Lymphatic: Aortic atherosclerosis. No enlarged abdominal or pelvic lymph nodes. Reproductive: Status post hysterectomy Other: No abdominal wall hernia or abnormality. No ascites. Musculoskeletal: No acute or significant osseous findings. IMPRESSION: 1. Negative examination for pulmonary embolism. 2. Diffuse bilateral bronchial wall thickening and diffuse, fine centrilobular and  ground-glass nodularity throughout the lungs, consistent with nonspecific infectious or inflammatory bronchitis. 3. Severe aortic atherosclerosis, notable for irregular intramural thrombus within the lower thoracic aorta. 4. No acute CT findings of the abdomen or pelvis. 5. Coronary artery disease. Aortic Atherosclerosis (ICD10-I70.0). Electronically Signed   By: Jearld Lesch M.D.   On: 04/18/2023 17:58   CT ABDOMEN PELVIS W CONTRAST  Result Date: 04/18/2023 CLINICAL DATA:  PE suspected abdominal pain EXAM: CT ANGIOGRAPHY CHEST WITH CONTRAST CT ABDOMEN PELVIS WITH CONTRAST TECHNIQUE: Multidetector CT imaging of the chest was performed using the standard protocol during bolus administration of intravenous contrast. Multiplanar CT image reconstructions and MIPs were obtained to evaluate the vascular anatomy. Multidetector CT imaging of the abdomen and pelvis was performed using the standard protocol following bolus administration of intravenous contrast. RADIATION DOSE REDUCTION: This exam was performed according to the departmental dose-optimization program which includes automated exposure control, adjustment of the mA and/or kV according to patient size and/or use of iterative reconstruction technique. CONTRAST:  75mL OMNIPAQUE IOHEXOL 350 MG/ML SOLN COMPARISON:  None Available. FINDINGS: CT CHEST ANGIOGRAM FINDINGS Cardiovascular: Satisfactory opacification of the pulmonary arteries to the segmental level. No evidence of pulmonary embolism. Normal heart size. Left coronary artery calcifications. No pericardial effusion. Aortic atherosclerosis. Irregular intramural thrombus within the lower thoracic aorta (series 5, image 13). Mediastinum/Nodes: No enlarged mediastinal, hilar, or axillary lymph nodes. Small calcified granulomatous mediastinal and hilar lymph nodes, sequelae of prior infection inflammation thyroid gland, trachea, and esophagus demonstrate no significant findings. Lungs/Pleura: Diffuse  bilateral bronchial wall thickening. Diffuse, fine centrilobular and ground-glass nodularity throughout the lungs. Bibasilar scarring or atelectasis. No pleural effusion or pneumothorax. Musculoskeletal: No chest wall abnormality. No acute osseous findings. Review of the MIP images confirms the above findings. CT ABDOMEN PELVIS FINDINGS Hepatobiliary: No solid liver abnormality is seen. No gallstones, gallbladder wall thickening, or biliary dilatation. Pancreas: Unremarkable. No pancreatic ductal dilatation or surrounding inflammatory changes. Spleen: Normal in size without significant abnormality. Adrenals/Urinary Tract: Adrenal glands are unremarkable. Kidneys are normal, without renal calculi, solid lesion, or hydronephrosis. Bladder is unremarkable. Stomach/Bowel: Stomach is within normal limits. Appendix appears normal. No evidence of bowel wall thickening, distention, or inflammatory changes. Rectosigmoid colon resection Vascular/Lymphatic: Aortic atherosclerosis. No enlarged abdominal or pelvic lymph nodes. Reproductive: Status post hysterectomy Other: No abdominal wall hernia or abnormality. No ascites. Musculoskeletal: No acute or significant osseous findings. IMPRESSION: 1. Negative examination for pulmonary embolism. 2. Diffuse bilateral bronchial wall thickening and diffuse, fine centrilobular and ground-glass nodularity throughout the lungs, consistent with nonspecific infectious or inflammatory bronchitis.  3. Severe aortic atherosclerosis, notable for irregular intramural thrombus within the lower thoracic aorta. 4. No acute CT findings of the abdomen or pelvis. 5. Coronary artery disease. Aortic Atherosclerosis (ICD10-I70.0). Electronically Signed   By: Jearld Lesch M.D.   On: 04/18/2023 17:58   DG Chest Portable 1 View  Result Date: 04/18/2023 CLINICAL DATA:  Syncope EXAM: PORTABLE CHEST 1 VIEW COMPARISON:  10/06/2022 FINDINGS: No focal airspace disease or pleural effusion. Stable  cardiomediastinal silhouette with aortic atherosclerosis. No pneumothorax. Scoliosis of the spine. IMPRESSION: No active disease. Electronically Signed   By: Jasmine Pang M.D.   On: 04/18/2023 16:34      Assessment/Plan   Principal Problem:   Syncope   ***            ***                ***                 ***               ***               ***               ***                ***               ***               ***               ***               ***              ***          ***  DVT prophylaxis: SCD's ***  Code Status: Full code*** Family Communication: none*** Disposition Plan: Per Rounding Team Consults called: none***;  Admission status: ***     I SPENT GREATER THAN 75 *** MINUTES IN CLINICAL CARE TIME/MEDICAL DECISION-MAKING IN COMPLETING THIS ADMISSION.      Chaney Born Larren Copes DO [facility]  From 7PM - 7AM   04/18/2023, 9:15 PM   ***

## 2023-04-18 NOTE — ED Notes (Signed)
Patient transported to floor at this time with monitor

## 2023-04-18 NOTE — ED Notes (Addendum)
Patient resting, family updated.

## 2023-04-18 NOTE — ED Triage Notes (Signed)
Pt to ED via [facility] from [facility] [place]. Staff reports pt sat down for breakfast, had a sip of water, and then had an episode of unresponsiveness for about 5 minutes with 1 episode of vomiting, no blood noted in vomit. Staff reports pt back at baseline after episode. Pt has hx of dementia. Pt had episode of loose BM at facility with no blood PTA. EMS gave 4 of zofran en route. Pt c/o diffuse abd pain this morning but denies any pain upon arrival to ED. Pt also c/o "burning in bladder."    EMS: 92 sys initial 102/48 86 HR 14 RR 95% RA 154 CBG 97.2 temp 20 LAC

## 2023-04-18 NOTE — ED Notes (Signed)
ED TO INPATIENT HANDOFF REPORT  ED Nurse Name and Phone #: Angelique Holm [phone]  S Name/Age/Gender Brenda Hammond 89 y.o. female Room/Bed: 018C/018C  Code Status   Code Status: Limited: Do not attempt resuscitation (DNR) -DNR-LIMITED -Do Not Intubate/DNI   Home/SNF/Other Skilled nursing facility Patient oriented to: self Is this baseline? Yes   Triage Complete: Triage complete  Chief Complaint Syncope [R55]  Triage Note Pt to ED via [facility] from [facility]. Staff reports pt sat down for breakfast, had a sip of water, and then had an episode of unresponsiveness for about 5 minutes with 1 episode of vomiting, no blood noted in vomit. Staff reports pt back at baseline after episode. Pt has hx of dementia. Pt had episode of loose BM at facility with no blood PTA. EMS gave 4 of zofran en route. Pt c/o diffuse abd pain this morning but denies any pain upon arrival to ED. Pt also c/o "burning in bladder."    EMS: 92 sys initial 102/48 86 HR 14 RR 95% RA 154 CBG 97.2 temp 20 LAC   Allergies Allergies  Allergen Reactions   Sulfa Antibiotics Anaphylaxis, Hypertension and Other (See Comments)    Elevated HR and BP, also and "ALLERGIC," per MAR  REACTION: elevated HR and BP   Alendronate Other (See Comments)    "twitching" and "ALLERGIC," per [facility]   Ampicillin Other (See Comments)    High fever and "ALLERGIC," per [facility]   Aspirin Other (See Comments)    Made the ears ring and "ALLERGIC," per [facility]   Cyclobenzaprine Hcl Other (See Comments)    Nightmares- "ALLERGIC," per [facility]   Metaxalone Other (See Comments)    Nightmares- "ALLERGIC," per [facility]   Nitrofurantoin Other (See Comments) and Hypertension    Elevated B/P and chest pain- "ALLERGIC," per [facility]   Sulfonamide Derivatives Other (See Comments) and Hypertension    Elevated HR and B/P- "ALLERGIC," per [facility]   Aricept [Donepezil] Rash and Other (See Comments)    Dizziness and cramping also- "ALLERGIC," per [facility]    Nortriptyline Hcl Rash and Other (See Comments)    "ALLERGIC," per [facility]   Nsaids Rash and Other (See Comments)    "ALLERGIC," per [facility]   Penicillins Rash and Other (See Comments)    High fever, too- "ALLERGIC," per MAR    Level of Care/Admitting Diagnosis ED Disposition     ED Disposition  Admit   Condition  --   Comment  Hospital Area: [facility] [[id number]]  Level of Care: Telemetry Medical [104]  May place patient in observation at [facility] or [facility] if equivalent level of care is available:: No  Covid Evaluation: Asymptomatic - no recent exposure (last 10 days) testing not required  Diagnosis: Syncope [206001]  Admitting Physician: Angie Fava [[id number]]  Attending Physician: Angie Fava [[id number]]          B Medical/Surgery History Past Medical History:  Diagnosis Date   Anemia    Anxiety    Atrial fibrillation (HCC)    Colon adenocarcinoma (HCC)    DJD (degenerative joint disease)    Fibromyalgia    GERD (gastroesophageal reflux disease)    History of poliomyelitis    History of UTI    Hyperlipemia    Memory disorder 03/01/2019   Other drug allergy(995.27)    Scoliosis    Surgical or other procedure not carried out because of patient's decision    Venous insufficiency    Past Surgical  History:  Procedure Laterality Date   APPENDECTOMY     LOW ANTERIOR BOWEL RESECTION  06/2009   with anastomosis for colon cancer   OTHER SURGICAL HISTORY     T & A   TOTAL ABDOMINAL HYSTERECTOMY W/ BILATERAL SALPINGOOPHORECTOMY  1980s     A IV Location/Drains/Wounds Patient Lines/Drains/Airways Status     Active Line/Drains/Airways     Name Placement date Placement time Site Days   Peripheral IV 04/18/23 20 G Left Antecubital 04/18/23  1251  Antecubital  less than 1            Intake/Output Last 24 hours  Intake/Output Summary (Last 24 hours) at 04/18/2023 2149 Last data filed at 04/18/2023 1700 Gross per 24 hour  Intake  500 ml  Output 500 ml  Net 0 ml    Labs/Imaging Results for orders placed or performed during the hospital encounter of 04/18/23 (from the past 48 hour(s))  CBG monitoring, ED     Status: Abnormal   Collection Time: 04/18/23  1:11 PM  Result Value Ref Range   Glucose-Capillary 130 (H) 70 - 99 mg/dL    Comment: Glucose reference range applies only to samples taken after fasting for at least 8 hours.  Basic metabolic panel     Status: Abnormal   Collection Time: 04/18/23  1:14 PM  Result Value Ref Range   Sodium 139 135 - 145 mmol/L   Potassium 4.0 3.5 - 5.1 mmol/L   Chloride 102 98 - 111 mmol/L   CO2 26 22 - 32 mmol/L   Glucose, Bld 138 (H) 70 - 99 mg/dL    Comment: Glucose reference range applies only to samples taken after fasting for at least 8 hours.   BUN 18 8 - 23 mg/dL   Creatinine, Ser 4.09 (H) 0.44 - 1.00 mg/dL   Calcium 8.9 8.9 - 81.1 mg/dL   GFR, Estimated 42 (L) >60 mL/min    Comment: (NOTE) Calculated using the CKD-EPI Creatinine Equation (2021)    Anion gap 11 5 - 15    Comment: Performed at [facility], [address] [place], [place] [address]  CBC WITH DIFFERENTIAL     Status: Abnormal   Collection Time: 04/18/23  1:14 PM  Result Value Ref Range   WBC 9.8 4.0 - 10.5 K/uL   RBC 4.17 3.87 - 5.11 MIL/uL   Hemoglobin 11.9 (L) 12.0 - 15.0 g/dL   HCT 29.5 62.1 - 30.8 %   MCV 90.4 80.0 - 100.0 fL   MCH 28.5 26.0 - 34.0 pg   MCHC 31.6 30.0 - 36.0 g/dL   RDW 65.7 84.6 - 96.2 %   Platelets 242 150 - 400 K/uL   nRBC 0.0 0.0 - 0.2 %   Neutrophils Relative % 76 %   Neutro Abs 7.5 1.7 - 7.7 K/uL   Lymphocytes Relative 10 %   Lymphs Abs 1.0 0.7 - 4.0 K/uL   Monocytes Relative 9 %   Monocytes Absolute 0.9 0.1 - 1.0 K/uL   Eosinophils Relative 3 %   Eosinophils Absolute 0.3 0.0 - 0.5 K/uL   Basophils Relative 1 %   Basophils Absolute 0.1 0.0 - 0.1 K/uL   Immature Granulocytes 1 %   Abs Immature Granulocytes 0.06 0.00 - 0.07 K/uL    Comment: Performed at  [facility], [address], [place], [place] [address]  Hepatic function panel     Status: Abnormal   Collection Time: 04/18/23  1:14 PM  Result Value Ref Range   Total Protein 6.7 6.5 - 8.1 g/dL   Albumin 3.3 (L) 3.5 - 5.0 g/dL   AST 25 15 - 41 U/L   ALT 17 0 - 44 U/L   Alkaline Phosphatase 97 38 - 126 U/L   Total Bilirubin 0.7 0.3 - 1.2 mg/dL   Bilirubin, Direct <5.2 0.0 - 0.2 mg/dL   Indirect Bilirubin NOT CALCULATED 0.3 - 0.9 mg/dL    Comment: Performed at [facility], [address] [place], [place] [address]  Lipase, blood     Status: None   Collection Time: 04/18/23  1:14 PM  Result Value Ref Range   Lipase 43 11 - 51 U/L    Comment: Performed at [facility], [address] [place], [place] [address]  Magnesium     Status: None   Collection Time: 04/18/23  1:14 PM  Result Value Ref Range   Magnesium 2.1 1.7 - 2.4 mg/dL    Comment: Performed at [facility], [address], [place], [place] [address]  Troponin I (High Sensitivity)     Status: None   Collection Time: 04/18/23  1:14 PM  Result Value Ref Range   Troponin I (High Sensitivity) 7 <18 ng/L    Comment: (NOTE) Elevated high sensitivity troponin I (hsTnI) values and significant  changes across serial measurements may suggest ACS but many other  chronic and acute conditions are known to elevate hsTnI results.  Refer to the "Links" section for chest pain algorithms and additional  guidance. Performed at [facility], [address] [place], [place] [address]   CK     Status: None   Collection Time: 04/18/23  1:14 PM  Result Value Ref Range   Total CK 51 38 - 234 U/L    Comment: Performed at [facility], [address], [place], [place] [address]  Urinalysis, w/ Reflex to Culture (Infection Suspected) -Urine, Clean Catch     Status: Abnormal   Collection Time: 04/18/23  1:15 PM  Result Value Ref Range   Specimen Source URINE, CLEAN CATCH    Color, Urine STRAW (A)  YELLOW   APPearance CLEAR CLEAR   Specific Gravity, Urine 1.016 1.005 - 1.030   pH 7.0 5.0 - 8.0   Glucose, UA NEGATIVE NEGATIVE mg/dL   Hgb urine dipstick NEGATIVE NEGATIVE   Bilirubin Urine NEGATIVE NEGATIVE   Ketones, ur NEGATIVE NEGATIVE mg/dL   Protein, ur NEGATIVE NEGATIVE mg/dL   Nitrite NEGATIVE NEGATIVE   Leukocytes,Ua SMALL (A) NEGATIVE   RBC / HPF 0-5 0 - 5 RBC/hpf   WBC, UA 6-10 0 - 5 WBC/hpf    Comment:        Reflex urine culture not performed if WBC <=10, OR if Squamous epithelial cells >5. If Squamous epithelial cells >5 suggest recollection.    Bacteria, UA RARE (A) NONE SEEN   Squamous Epithelial / HPF 0-5 0 - 5 /HPF    Comment: Performed at [facility], [address], [place], [place] [address]  Troponin I (High Sensitivity)     Status: None   Collection Time: 04/18/23  3:15 PM  Result Value Ref Range   Troponin I (High Sensitivity) 6 <18 ng/L    Comment: (NOTE) Elevated high sensitivity troponin I (hsTnI) values and significant  changes across serial measurements may suggest ACS but many other  chronic and acute conditions are known to elevate hsTnI results.  Refer  to the "Links" section for chest pain algorithms and additional  guidance. Performed at [facility], [address] [place], [place] [address]   I-Stat Chem 8, ED     Status: Abnormal   Collection Time: 04/18/23  3:26 PM  Result Value Ref Range   Sodium 140 135 - 145 mmol/L   Potassium 4.3 3.5 - 5.1 mmol/L   Chloride 103 98 - 111 mmol/L   BUN 18 8 - 23 mg/dL   Creatinine, Ser 5.28 (H) 0.44 - 1.00 mg/dL   Glucose, Bld 413 (H) 70 - 99 mg/dL    Comment: Glucose reference range applies only to samples taken after fasting for at least 8 hours.   Calcium, Ion 1.23 1.15 - 1.40 mmol/L   TCO2 25 22 - 32 mmol/L   Hemoglobin 12.6 12.0 - 15.0 g/dL   HCT 24.4 01.0 - 27.2 %  I-Stat CG4 Lactic Acid     Status: None   Collection Time: 04/18/23  3:26 PM  Result Value Ref Range   Lactic  Acid, Venous 0.6 0.5 - 1.9 mmol/L   CT HEAD WO CONTRAST  Result Date: 04/18/2023 CLINICAL DATA:  Syncope/presyncope, cerebrovascular cause suspected EXAM: CT HEAD WITHOUT CONTRAST TECHNIQUE: Contiguous axial images were obtained from the base of the skull through the vertex without intravenous contrast. RADIATION DOSE REDUCTION: This exam was performed according to the departmental dose-optimization program which includes automated exposure control, adjustment of the mA and/or kV according to patient size and/or use of iterative reconstruction technique. COMPARISON:  Head CT 10/27/2022 FINDINGS: Brain: No intracranial hemorrhage, mass effect, or midline shift. Stable temporal predominant atrophy and chronic small vessel ischemia. No hydrocephalus. The basilar cisterns are patent. No evidence of territorial infarct or acute ischemia. No extra-axial or intracranial fluid collection. Vascular: Atherosclerosis of skullbase vasculature without hyperdense vessel or abnormal calcification. Skull: No fracture or focal lesion. Sinuses/Orbits: Paranasal sinuses and mastoid air cells are clear. The visualized orbits are unremarkable. Cataract resection Other: None. IMPRESSION: 1. No acute intracranial abnormality. 2. Stable atrophy and chronic small vessel ischemia. Electronically Signed   By: Narda Rutherford M.D.   On: 04/18/2023 19:27   CT Angio Chest PE W and/or Wo Contrast  Result Date: 04/18/2023 CLINICAL DATA:  PE suspected abdominal pain EXAM: CT ANGIOGRAPHY CHEST WITH CONTRAST CT ABDOMEN PELVIS WITH CONTRAST TECHNIQUE: Multidetector CT imaging of the chest was performed using the standard protocol during bolus administration of intravenous contrast. Multiplanar CT image reconstructions and MIPs were obtained to evaluate the vascular anatomy. Multidetector CT imaging of the abdomen and pelvis was performed using the standard protocol following bolus administration of intravenous contrast. RADIATION DOSE  REDUCTION: This exam was performed according to the departmental dose-optimization program which includes automated exposure control, adjustment of the mA and/or kV according to patient size and/or use of iterative reconstruction technique. CONTRAST:  75mL OMNIPAQUE IOHEXOL 350 MG/ML SOLN COMPARISON:  None Available. FINDINGS: CT CHEST ANGIOGRAM FINDINGS Cardiovascular: Satisfactory opacification of the pulmonary arteries to the segmental level. No evidence of pulmonary embolism. Normal heart size. Left coronary artery calcifications. No pericardial effusion. Aortic atherosclerosis. Irregular intramural thrombus within the lower thoracic aorta (series 5, image 13). Mediastinum/Nodes: No enlarged mediastinal, hilar, or axillary lymph nodes. Small calcified granulomatous mediastinal and hilar lymph nodes, sequelae of prior infection inflammation thyroid gland, trachea, and esophagus demonstrate no significant findings. Lungs/Pleura: Diffuse bilateral bronchial wall thickening. Diffuse, fine centrilobular and ground-glass nodularity throughout the lungs. Bibasilar scarring or atelectasis. No pleural effusion  or pneumothorax. Musculoskeletal: No chest wall abnormality. No acute osseous findings. Review of the MIP images confirms the above findings. CT ABDOMEN PELVIS FINDINGS Hepatobiliary: No solid liver abnormality is seen. No gallstones, gallbladder wall thickening, or biliary dilatation. Pancreas: Unremarkable. No pancreatic ductal dilatation or surrounding inflammatory changes. Spleen: Normal in size without significant abnormality. Adrenals/Urinary Tract: Adrenal glands are unremarkable. Kidneys are normal, without renal calculi, solid lesion, or hydronephrosis. Bladder is unremarkable. Stomach/Bowel: Stomach is within normal limits. Appendix appears normal. No evidence of bowel wall thickening, distention, or inflammatory changes. Rectosigmoid colon resection Vascular/Lymphatic: Aortic atherosclerosis. No enlarged  abdominal or pelvic lymph nodes. Reproductive: Status post hysterectomy Other: No abdominal wall hernia or abnormality. No ascites. Musculoskeletal: No acute or significant osseous findings. IMPRESSION: 1. Negative examination for pulmonary embolism. 2. Diffuse bilateral bronchial wall thickening and diffuse, fine centrilobular and ground-glass nodularity throughout the lungs, consistent with nonspecific infectious or inflammatory bronchitis. 3. Severe aortic atherosclerosis, notable for irregular intramural thrombus within the lower thoracic aorta. 4. No acute CT findings of the abdomen or pelvis. 5. Coronary artery disease. Aortic Atherosclerosis (ICD10-I70.0). Electronically Signed   By: Jearld Lesch M.D.   On: 04/18/2023 17:58   CT ABDOMEN PELVIS W CONTRAST  Result Date: 04/18/2023 CLINICAL DATA:  PE suspected abdominal pain EXAM: CT ANGIOGRAPHY CHEST WITH CONTRAST CT ABDOMEN PELVIS WITH CONTRAST TECHNIQUE: Multidetector CT imaging of the chest was performed using the standard protocol during bolus administration of intravenous contrast. Multiplanar CT image reconstructions and MIPs were obtained to evaluate the vascular anatomy. Multidetector CT imaging of the abdomen and pelvis was performed using the standard protocol following bolus administration of intravenous contrast. RADIATION DOSE REDUCTION: This exam was performed according to the departmental dose-optimization program which includes automated exposure control, adjustment of the mA and/or kV according to patient size and/or use of iterative reconstruction technique. CONTRAST:  75mL OMNIPAQUE IOHEXOL 350 MG/ML SOLN COMPARISON:  None Available. FINDINGS: CT CHEST ANGIOGRAM FINDINGS Cardiovascular: Satisfactory opacification of the pulmonary arteries to the segmental level. No evidence of pulmonary embolism. Normal heart size. Left coronary artery calcifications. No pericardial effusion. Aortic atherosclerosis. Irregular intramural thrombus within  the lower thoracic aorta (series 5, image 13). Mediastinum/Nodes: No enlarged mediastinal, hilar, or axillary lymph nodes. Small calcified granulomatous mediastinal and hilar lymph nodes, sequelae of prior infection inflammation thyroid gland, trachea, and esophagus demonstrate no significant findings. Lungs/Pleura: Diffuse bilateral bronchial wall thickening. Diffuse, fine centrilobular and ground-glass nodularity throughout the lungs. Bibasilar scarring or atelectasis. No pleural effusion or pneumothorax. Musculoskeletal: No chest wall abnormality. No acute osseous findings. Review of the MIP images confirms the above findings. CT ABDOMEN PELVIS FINDINGS Hepatobiliary: No solid liver abnormality is seen. No gallstones, gallbladder wall thickening, or biliary dilatation. Pancreas: Unremarkable. No pancreatic ductal dilatation or surrounding inflammatory changes. Spleen: Normal in size without significant abnormality. Adrenals/Urinary Tract: Adrenal glands are unremarkable. Kidneys are normal, without renal calculi, solid lesion, or hydronephrosis. Bladder is unremarkable. Stomach/Bowel: Stomach is within normal limits. Appendix appears normal. No evidence of bowel wall thickening, distention, or inflammatory changes. Rectosigmoid colon resection Vascular/Lymphatic: Aortic atherosclerosis. No enlarged abdominal or pelvic lymph nodes. Reproductive: Status post hysterectomy Other: No abdominal wall hernia or abnormality. No ascites. Musculoskeletal: No acute or significant osseous findings. IMPRESSION: 1. Negative examination for pulmonary embolism. 2. Diffuse bilateral bronchial wall thickening and diffuse, fine centrilobular and ground-glass nodularity throughout the lungs, consistent with nonspecific infectious or inflammatory bronchitis. 3. Severe aortic atherosclerosis, notable for irregular intramural thrombus within the lower thoracic aorta.  4. No acute CT findings of the abdomen or pelvis. 5. Coronary artery  disease. Aortic Atherosclerosis (ICD10-I70.0). Electronically Signed   By: Jearld Lesch M.D.   On: 04/18/2023 17:58   DG Chest Portable 1 View  Result Date: 04/18/2023 CLINICAL DATA:  Syncope EXAM: PORTABLE CHEST 1 VIEW COMPARISON:  10/06/2022 FINDINGS: No focal airspace disease or pleural effusion. Stable cardiomediastinal silhouette with aortic atherosclerosis. No pneumothorax. Scoliosis of the spine. IMPRESSION: No active disease. Electronically Signed   By: Jasmine Pang M.D.   On: 04/18/2023 16:34    Pending Labs Unresulted Labs (From admission, onward)     Start     Ordered   04/19/23 0500  CBC with Differential/Platelet  Tomorrow morning,   R        04/18/23 2112   04/19/23 0500  Comprehensive metabolic panel  Tomorrow morning,   R        04/18/23 2112   04/19/23 0500  Magnesium  Tomorrow morning,   R        04/18/23 2112            Vitals/Pain Today's Vitals   04/18/23 1900 04/18/23 2030 04/18/23 2053 04/18/23 2130  BP: (!) 152/70 (!) 160/81  (!) 129/53  Pulse: 67 75  81  Resp: 14 17  (!) 25  Temp:   97.8 F (36.6 C)   TempSrc:      SpO2: 98% 99%  99%    Isolation Precautions No active isolations  Medications Medications  ondansetron (ZOFRAN) injection 4 mg (has no administration in time range)  acetaminophen (TYLENOL) tablet 650 mg (has no administration in time range)    Or  acetaminophen (TYLENOL) suppository 650 mg (has no administration in time range)  melatonin tablet 3 mg (has no administration in time range)  lactated ringers bolus 500 mL (0 mLs Intravenous Stopped 04/18/23 1419)  lactated ringers bolus 500 mL (0 mLs Intravenous Stopped 04/18/23 1552)  iohexol (OMNIPAQUE) 350 MG/ML injection 75 mL (75 mLs Intravenous Contrast Given 04/18/23 1615)  fentaNYL (SUBLIMAZE) injection 25 mcg (25 mcg Intravenous Given 04/18/23 1633)    Mobility walks with device     Focused Assessments Neuro Assessment Handoff:  Swallow screen pass?  Not ordered.   Patient was able to eat and drink without difficulty         Neuro Assessment:   Neuro Checks:      Has TPA been given? No If patient is a Neuro Trauma and patient is going to OR before floor call report to 4N Charge nurse: [phone] or [phone]   R Recommendations: See Admitting Provider Note  Report given to:   Additional Notes: .

## 2023-04-18 NOTE — ED Notes (Signed)
Patient transported to CT 

## 2023-04-19 ENCOUNTER — Observation Stay (HOSPITAL_BASED_OUTPATIENT_CLINIC_OR_DEPARTMENT_OTHER): Payer: Medicare Other

## 2023-04-19 ENCOUNTER — Encounter (HOSPITAL_COMMUNITY): Payer: Self-pay | Admitting: Internal Medicine

## 2023-04-19 ENCOUNTER — Observation Stay (HOSPITAL_COMMUNITY): Payer: Medicare Other

## 2023-04-19 DIAGNOSIS — I4891 Unspecified atrial fibrillation: Secondary | ICD-10-CM | POA: Diagnosis not present

## 2023-04-19 DIAGNOSIS — R531 Weakness: Secondary | ICD-10-CM

## 2023-04-19 DIAGNOSIS — R9089 Other abnormal findings on diagnostic imaging of central nervous system: Secondary | ICD-10-CM | POA: Diagnosis not present

## 2023-04-19 DIAGNOSIS — G319 Degenerative disease of nervous system, unspecified: Secondary | ICD-10-CM | POA: Diagnosis not present

## 2023-04-19 DIAGNOSIS — R55 Syncope and collapse: Secondary | ICD-10-CM

## 2023-04-19 LAB — TROPONIN I (HIGH SENSITIVITY): Troponin I (High Sensitivity): 10 ng/L (ref <18)

## 2023-04-19 LAB — COMPREHENSIVE METABOLIC PANEL
ALT: 13 U/L (ref 0–44)
AST: 19 U/L (ref 15–41)
Albumin: 2.7 g/dL — ABNORMAL LOW (ref 3.5–5.0)
Alkaline Phosphatase: 92 U/L (ref 38–126)
Anion gap: 8 (ref 5–15)
BUN: 14 mg/dL (ref 8–23)
CO2: 25 mmol/L (ref 22–32)
Calcium: 8.7 mg/dL — ABNORMAL LOW (ref 8.9–10.3)
Chloride: 108 mmol/L (ref 98–111)
Creatinine, Ser: 0.87 mg/dL (ref 0.44–1.00)
GFR, Estimated: >60 mL/min (ref >60)
Glucose, Bld: 94 mg/dL (ref 70–99)
Potassium: 4 mmol/L (ref 3.5–5.1)
Sodium: 141 mmol/L (ref 135–145)
Total Bilirubin: 0.3 mg/dL (ref 0.3–1.2)
Total Protein: 5.5 g/dL — ABNORMAL LOW (ref 6.5–8.1)

## 2023-04-19 LAB — TSH: TSH: 2.026 u[IU]/mL (ref 0.350–4.500)

## 2023-04-19 LAB — CBC WITH DIFFERENTIAL/PLATELET
Abs Immature Granulocytes: 0.03 10*3/uL (ref 0.00–0.07)
Basophils Absolute: 0 10*3/uL (ref 0.0–0.1)
Basophils Relative: 1 %
Eosinophils Absolute: 0.4 10*3/uL (ref 0.0–0.5)
Eosinophils Relative: 5 %
HCT: 33.2 % — ABNORMAL LOW (ref 36.0–46.0)
Hemoglobin: 10.8 g/dL — ABNORMAL LOW (ref 12.0–15.0)
Immature Granulocytes: 0 %
Lymphocytes Relative: 19 %
Lymphs Abs: 1.5 10*3/uL (ref 0.7–4.0)
MCH: 29.3 pg (ref 26.0–34.0)
MCHC: 32.5 g/dL (ref 30.0–36.0)
MCV: 90 fL (ref 80.0–100.0)
Monocytes Absolute: 1 10*3/uL (ref 0.1–1.0)
Monocytes Relative: 13 %
Neutro Abs: 5.1 10*3/uL (ref 1.7–7.7)
Neutrophils Relative %: 62 %
Platelets: 211 10*3/uL (ref 150–400)
RBC: 3.69 MIL/uL — ABNORMAL LOW (ref 3.87–5.11)
RDW: 13.3 % (ref 11.5–15.5)
WBC: 8 10*3/uL (ref 4.0–10.5)
nRBC: 0 % (ref 0.0–0.2)

## 2023-04-19 LAB — ECHOCARDIOGRAM COMPLETE
Area-P 1/2: 4.46 cm2
S' Lateral: 3.1 cm

## 2023-04-19 LAB — VITAMIN B12: Vitamin B-12: 172 pg/mL — ABNORMAL LOW (ref 180–914)

## 2023-04-19 LAB — MAGNESIUM: Magnesium: 1.9 mg/dL (ref 1.7–2.4)

## 2023-04-19 MED ORDER — CYANOCOBALAMIN 1000 MCG PO TABS: 1000.0000 ug | ORAL_TABLET | Freq: Every day | ORAL | 0 refills | Status: AC

## 2023-04-19 MED ORDER — CYANOCOBALAMIN 1000 MCG/ML IJ SOLN
1000.0000 ug | Freq: Once | INTRAMUSCULAR | Status: AC
  Administered 2023-04-19: 1000 ug via INTRAMUSCULAR
  Filled 2023-04-19: qty 1

## 2023-04-19 MED ORDER — VITAMIN B-12 1000 MCG PO TABS
1000.0000 ug | ORAL_TABLET | Freq: Every day | ORAL | Status: DC
  Filled 2023-04-19: qty 1

## 2023-04-19 MED ORDER — MELATONIN 3 MG PO TABS: 3.0000 mg | ORAL_TABLET | Freq: Every evening | ORAL | 0 refills | Status: AC | PRN

## 2023-04-19 NOTE — Plan of Care (Signed)
  Problem: Education: Goal: Knowledge of General Education information will improve Description Including pain rating scale, medication(s)/side effects and non-pharmacologic comfort measures Outcome: Progressing   Problem: Health Behavior/Discharge Planning: Goal: Ability to manage health-related needs will improve Outcome: Progressing   Problem: Clinical Measurements: Goal: Will remain free from infection Outcome: Progressing   Problem: Coping: Goal: Level of anxiety will decrease Outcome: Progressing   Problem: Safety: Goal: Ability to remain free from injury will improve Outcome: Progressing   Problem: Skin Integrity: Goal: Risk for impaired skin integrity will decrease Outcome: Progressing   

## 2023-04-19 NOTE — Plan of Care (Signed)
  Problem: Education: Goal: Knowledge of General Education information will improve Description Including pain rating scale, medication(s)/side effects and non-pharmacologic comfort measures Outcome: Progressing   

## 2023-04-19 NOTE — Discharge Summary (Addendum)
Physician Discharge Summary  Brenda Hammond IHK:[id number] DOB: 1932-12-15 DOA: 04/18/2023  PCP: Florentina Jenny, MD  Admit date: 04/18/2023 Discharge date: 04/19/2023  Admitted From: SNF Disposition:  SNF  Discharge Condition:Stable CODE STATUS:DNR Diet recommendation:  Regular  Brief/Interim Summary: Patient is a 89 year old female with history of dementia, paroxysmal A-fib who was brought from nursing facility after she had an episode of loss of consciousness/syncope.  This happened suddenly when she was ambulating at her memory care facility.  Patient was consciousness for approximately 3 minutes.  Regained her consciousness without being followed by confusion.  No suspicion for seizure.  Had 2 episode of nausea vomiting after syncope.  She reported generalized weakness.  On presentation she was hemodynamically stable with a stable blood pressure.  Patient was admitted for the management of syncope.  CT head did not show any acute findings.  MRI did not show any acute findings.  Echo showed EF of 60 to 65%, no wall motion abnormality, no valvular abnormalities.  Patient remains in baseline mentation.  Medical stable for discharge back to skilled nursing facility today.  Following problems were addressed during the hospitalization:  Single episode of syncope: No prodromal symptoms.  Suddenly passed out while ambulating.  Low suspicion for seizure.  Witnesses did not see any jerking movement, bladder/bowel incontinence, tongue biting.  Given some IV fluid in the emergency department.  Orthostatic vitals negative.  EKG did not show any ischemic changes. MRI/ CT head did not show any acute findings.  CTA chest negative for PE.  No evidence of acute focal deficits.  Currently at baseline mental status.  Echo showed EF of 60 to 65%, no wall motion abnormality, no valvular abnormalities.  Patient remains in baseline mentation.   Generalized weakness: PT/OT been consulted.  No follow-up  recommended   Low vitamin B12: Given an injection of vitamin B12 IM.  Continue daily supplementation for tomorrow.  Check vitamin B12 in 4 to 6 weeks.   Paroxysmal A-fib: Currently not on anticoagulation.  Remains in normal sinus rhythm.  Not on AV nodal blocking agents  Discharge Diagnoses:  Principal Problem:   Syncope Active Problems:   Paroxysmal atrial fibrillation [facility])   Generalized weakness    Discharge Instructions  Discharge Instructions     Diet general   Complete by: As directed    Discharge instructions   Complete by: As directed    1)Please take your medications as instructed 2)Follow up with your PCP as an outpatient   Increase activity slowly   Complete by: As directed       Allergies as of 04/19/2023       Reactions   Sulfa Antibiotics Anaphylaxis, Hypertension, Other (See Comments)   Elevated HR and BP, also and "ALLERGIC," per [facility] REACTION: elevated HR and BP   Alendronate Other (See Comments)   "twitching" and "ALLERGIC," per [facility]   Ampicillin Other (See Comments)   High fever and "ALLERGIC," per [facility]   Aspirin Other (See Comments)   Made the ears ring and "ALLERGIC," per [facility]   Cyclobenzaprine Hcl Other (See Comments)   Nightmares- "ALLERGIC," per [facility]   Metaxalone Other (See Comments)   Nightmares- "ALLERGIC," per [facility]   Nitrofurantoin Other (See Comments), Hypertension   Elevated B/P and chest pain- "ALLERGIC," per [facility]   Sulfonamide Derivatives Other (See Comments), Hypertension   Elevated HR and B/P- "ALLERGIC," per [facility]   Aricept [donepezil] Rash, Other (See Comments)   Dizziness and cramping also- "ALLERGIC," per [facility]  Nortriptyline Hcl Rash, Other (See Comments)   "ALLERGIC," per [facility]   Nsaids Rash, Other (See Comments)   "ALLERGIC," per [facility]   Penicillins Rash, Other (See Comments)   High fever, too- "ALLERGIC," per [facility]        Medication List     TAKE these medications    bisacodyl 5 MG EC tablet Take 5 mg by mouth daily as  needed (for constipation).   cyanocobalamin 1000 MCG tablet Take 1 tablet (1,000 mcg total) by mouth daily. Start taking on: April 20, 2023   lidocaine 2 % solution Commonly known as: XYLOCAINE Use as directed 1 mL in the mouth or throat See admin instructions. Rinse with 1 ml in the mouth every six hours as needed for sores. Rinse, then spit.   loratadine 10 MG tablet Commonly known as: CLARITIN Take 10 mg by mouth daily as needed for allergies.   melatonin 3 MG Tabs tablet Take 1 tablet (3 mg total) by mouth at bedtime as needed (insomnia).   PSYLLIUM PO Take 0.4 g by mouth at bedtime.   Tylenol 8 Hour Arthritis Pain 650 MG CR tablet Generic drug: acetaminophen Take 650 mg by mouth every 12 (twelve) hours as needed for pain.        Follow-up Information     Florentina Jenny, MD. Schedule an appointment as soon as possible for a visit in 1 week(s).   Specialty: Family Medicine Contact information: [address] [place] [address] [phone]                Allergies  Allergen Reactions   Sulfa Antibiotics Anaphylaxis, Hypertension and Other (See Comments)    Elevated HR and BP, also and "ALLERGIC," per [facility]  REACTION: elevated HR and BP   Alendronate Other (See Comments)    "twitching" and "ALLERGIC," per [facility]   Ampicillin Other (See Comments)    High fever and "ALLERGIC," per [facility]   Aspirin Other (See Comments)    Made the ears ring and "ALLERGIC," per [facility]   Cyclobenzaprine Hcl Other (See Comments)    Nightmares- "ALLERGIC," per [facility]   Metaxalone Other (See Comments)    Nightmares- "ALLERGIC," per [facility]   Nitrofurantoin Other (See Comments) and Hypertension    Elevated B/P and chest pain- "ALLERGIC," per [facility]   Sulfonamide Derivatives Other (See Comments) and Hypertension    Elevated HR and B/P- "ALLERGIC," per [facility]   Aricept [Donepezil] Rash and Other (See Comments)    Dizziness and cramping also- "ALLERGIC," per [facility]   Nortriptyline Hcl  Rash and Other (See Comments)    "ALLERGIC," per [facility]   Nsaids Rash and Other (See Comments)    "ALLERGIC," per [facility]   Penicillins Rash and Other (See Comments)    High fever, too- "ALLERGIC," per [facility]    Consultations: None   Procedures/Studies: ECHOCARDIOGRAM COMPLETE  Result Date: 04/19/2023    ECHOCARDIOGRAM REPORT   Patient Name:   DRINDA HASBERRY [facility] Date of Exam: 04/19/2023 Medical Rec #:  [id number]               Height:       61.0 in Accession #:    [id number]              Weight:       118.2 lb Date of Birth:  02-21-1933               BSA:          1.510  m Patient Age:    66 years                BP:           118/45 mmHg Patient Gender: F                       HR:           64 bpm. Exam Location:  Inpatient Procedure: 2D Echo, Cardiac Doppler and Color Doppler Indications:    syncope  History:        Patient has prior history of Echocardiogram examinations, most                 recent 07/14/2022. Arrythmias:Atrial Fibrillation; Risk                 Factors:Dyslipidemia.  Sonographer:    Melissa Morford RDCS (AE, PE) Referring Phys: [id number] JUSTIN B HOWERTER IMPRESSIONS  1. Left ventricular ejection fraction, by estimation, is 60 to 65%. The left ventricle has normal function. The left ventricle has no regional wall motion abnormalities. Left ventricular diastolic parameters are consistent with Grade I diastolic dysfunction (impaired relaxation).  2. Right ventricular systolic function is normal. The right ventricular size is normal.  3. The mitral valve is normal in structure. Trivial mitral valve regurgitation. No evidence of mitral stenosis.  4. The aortic valve is tricuspid. Aortic valve regurgitation is not visualized. No aortic stenosis is present.  5. The inferior vena cava is normal in size with greater than 50% respiratory variability, suggesting right atrial pressure of 3 mmHg. Comparison(s): No significant change from prior study. Prior images reviewed side by side. FINDINGS  Left  Ventricle: Left ventricular ejection fraction, by estimation, is 60 to 65%. The left ventricle has normal function. The left ventricle has no regional wall motion abnormalities. The left ventricular internal cavity size was normal in size. There is  no left ventricular hypertrophy. Left ventricular diastolic parameters are consistent with Grade I diastolic dysfunction (impaired relaxation). Right Ventricle: The right ventricular size is normal. No increase in right ventricular wall thickness. Right ventricular systolic function is normal. Left Atrium: Left atrial size was normal in size. Right Atrium: Right atrial size was normal in size. Pericardium: There is no evidence of pericardial effusion. Mitral Valve: The mitral valve is normal in structure. Trivial mitral valve regurgitation. No evidence of mitral valve stenosis. Tricuspid Valve: The tricuspid valve is normal in structure. Tricuspid valve regurgitation is not demonstrated. No evidence of tricuspid stenosis. Aortic Valve: The aortic valve is tricuspid. Aortic valve regurgitation is not visualized. No aortic stenosis is present. Pulmonic Valve: The pulmonic valve was normal in structure. Pulmonic valve regurgitation is not visualized. No evidence of pulmonic stenosis. Aorta: The aortic root is normal in size and structure. Venous: The inferior vena cava is normal in size with greater than 50% respiratory variability, suggesting right atrial pressure of 3 mmHg. IAS/Shunts: No atrial level shunt detected by color flow Doppler.  LEFT VENTRICLE PLAX 2D LVIDd:         4.40 cm   Diastology LVIDs:         3.10 cm   LV e' medial:    6.09 cm/s LV PW:         0.90 cm   LV E/e' medial:  15.1 LV IVS:        0.80 cm   LV e' lateral:   5.33 cm/s LVOT diam:  1.80 cm   LV E/e' lateral: 17.3 LV SV:         60 LV SV Index:   40 LVOT Area:     2.54 cm  RIGHT VENTRICLE RV S prime:     12.60 cm/s TAPSE (M-mode): 2.0 cm LEFT ATRIUM             Index        RIGHT ATRIUM            Index LA diam:        2.80 cm 1.85 cm/m   RA Area:     11.40 cm LA Vol (A2C):   28.7 ml 19.00 ml/m  RA Volume:   26.50 ml  17.55 ml/m LA Vol (A4C):   41.4 ml 27.41 ml/m LA Biplane Vol: 36.6 ml 24.23 ml/m  AORTIC VALVE LVOT Vmax:   105.00 cm/s LVOT Vmean:  75.700 cm/s LVOT VTI:    0.235 m  AORTA Ao Root diam: 2.70 cm Ao Asc diam:  2.70 cm MITRAL VALVE MV Area (PHT): 4.46 cm     SHUNTS MV Decel Time: 170 msec     Systemic VTI:  0.24 m MV E velocity: 92.10 cm/s   Systemic Diam: 1.80 cm MV A velocity: 120.00 cm/s MV E/A ratio:  0.77 Donato Schultz MD Electronically signed by Donato Schultz MD Signature Date/Time: 04/19/2023/1:29:16 PM    Final    MR BRAIN WO CONTRAST  Result Date: 04/19/2023 CLINICAL DATA:  89 year old female with atrial fibrillation. Syncope. EXAM: MRI HEAD WITHOUT CONTRAST TECHNIQUE: Multiplanar, multiecho pulse sequences of the brain and surrounding structures were obtained without intravenous contrast. COMPARISON:  Head CT yesterday.  Brain MRI 08/17/2022. FINDINGS: Brain: No restricted diffusion to suggest acute infarction. No midline shift, mass effect, evidence of mass lesion, ventriculomegaly, extra-axial collection or acute intracranial hemorrhage. Cervicomedullary junction and pituitary are within normal limits. Chronic cerebral volume loss is pronounced but seems to disproportionally affect the anterior temporal lobes as before (series 6, image 12). Patchy and confluent bilateral cerebral white matter T2 and FLAIR hyperintensity appears stable. No discrete cortical encephalomalacia or chronic cerebral blood products. Small chronic left thalamic lacunar infarct. Small chronic bilateral cerebellar infarcts. Negative brainstem. Vascular: Major intracranial vascular flow voids are stable. Skull and upper cervical spine: Visualized bone marrow signal is within normal limits. Chronic cervical spine degeneration without significant change since February. Sinuses/Orbits: Stable. Chronic  postoperative changes to both globes. Other: Mastoids remain well aerated. Grossly intact visible internal auditory structures. Negative visible scalp and face. IMPRESSION: 1. No acute intracranial abnormality. 2. Noncontrast brain MRI stable since February MRI. Chronic small vessel disease, temporal lobe Cerebral Atrophy (ICD10-G31.9). Electronically Signed   By: Odessa Fleming M.D.   On: 04/19/2023 11:48   CT HEAD WO CONTRAST  Result Date: 04/18/2023 CLINICAL DATA:  Syncope/presyncope, cerebrovascular cause suspected EXAM: CT HEAD WITHOUT CONTRAST TECHNIQUE: Contiguous axial images were obtained from the base of the skull through the vertex without intravenous contrast. RADIATION DOSE REDUCTION: This exam was performed according to the departmental dose-optimization program which includes automated exposure control, adjustment of the mA and/or kV according to patient size and/or use of iterative reconstruction technique. COMPARISON:  Head CT 10/27/2022 FINDINGS: Brain: No intracranial hemorrhage, mass effect, or midline shift. Stable temporal predominant atrophy and chronic small vessel ischemia. No hydrocephalus. The basilar cisterns are patent. No evidence of territorial infarct or acute ischemia. No extra-axial or intracranial fluid collection. Vascular: Atherosclerosis of skullbase vasculature without hyperdense vessel or  abnormal calcification. Skull: No fracture or focal lesion. Sinuses/Orbits: Paranasal sinuses and mastoid air cells are clear. The visualized orbits are unremarkable. Cataract resection Other: None. IMPRESSION: 1. No acute intracranial abnormality. 2. Stable atrophy and chronic small vessel ischemia. Electronically Signed   By: Narda Rutherford M.D.   On: 04/18/2023 19:27   CT Angio Chest PE W and/or Wo Contrast  Result Date: 04/18/2023 CLINICAL DATA:  PE suspected abdominal pain EXAM: CT ANGIOGRAPHY CHEST WITH CONTRAST CT ABDOMEN PELVIS WITH CONTRAST TECHNIQUE: Multidetector CT imaging of  the chest was performed using the standard protocol during bolus administration of intravenous contrast. Multiplanar CT image reconstructions and MIPs were obtained to evaluate the vascular anatomy. Multidetector CT imaging of the abdomen and pelvis was performed using the standard protocol following bolus administration of intravenous contrast. RADIATION DOSE REDUCTION: This exam was performed according to the departmental dose-optimization program which includes automated exposure control, adjustment of the mA and/or kV according to patient size and/or use of iterative reconstruction technique. CONTRAST:  75mL OMNIPAQUE IOHEXOL 350 MG/ML SOLN COMPARISON:  None Available. FINDINGS: CT CHEST ANGIOGRAM FINDINGS Cardiovascular: Satisfactory opacification of the pulmonary arteries to the segmental level. No evidence of pulmonary embolism. Normal heart size. Left coronary artery calcifications. No pericardial effusion. Aortic atherosclerosis. Irregular intramural thrombus within the lower thoracic aorta (series 5, image 13). Mediastinum/Nodes: No enlarged mediastinal, hilar, or axillary lymph nodes. Small calcified granulomatous mediastinal and hilar lymph nodes, sequelae of prior infection inflammation thyroid gland, trachea, and esophagus demonstrate no significant findings. Lungs/Pleura: Diffuse bilateral bronchial wall thickening. Diffuse, fine centrilobular and ground-glass nodularity throughout the lungs. Bibasilar scarring or atelectasis. No pleural effusion or pneumothorax. Musculoskeletal: No chest wall abnormality. No acute osseous findings. Review of the MIP images confirms the above findings. CT ABDOMEN PELVIS FINDINGS Hepatobiliary: No solid liver abnormality is seen. No gallstones, gallbladder wall thickening, or biliary dilatation. Pancreas: Unremarkable. No pancreatic ductal dilatation or surrounding inflammatory changes. Spleen: Normal in size without significant abnormality. Adrenals/Urinary Tract:  Adrenal glands are unremarkable. Kidneys are normal, without renal calculi, solid lesion, or hydronephrosis. Bladder is unremarkable. Stomach/Bowel: Stomach is within normal limits. Appendix appears normal. No evidence of bowel wall thickening, distention, or inflammatory changes. Rectosigmoid colon resection Vascular/Lymphatic: Aortic atherosclerosis. No enlarged abdominal or pelvic lymph nodes. Reproductive: Status post hysterectomy Other: No abdominal wall hernia or abnormality. No ascites. Musculoskeletal: No acute or significant osseous findings. IMPRESSION: 1. Negative examination for pulmonary embolism. 2. Diffuse bilateral bronchial wall thickening and diffuse, fine centrilobular and ground-glass nodularity throughout the lungs, consistent with nonspecific infectious or inflammatory bronchitis. 3. Severe aortic atherosclerosis, notable for irregular intramural thrombus within the lower thoracic aorta. 4. No acute CT findings of the abdomen or pelvis. 5. Coronary artery disease. Aortic Atherosclerosis (ICD10-I70.0). Electronically Signed   By: Jearld Lesch M.D.   On: 04/18/2023 17:58   CT ABDOMEN PELVIS W CONTRAST  Result Date: 04/18/2023 CLINICAL DATA:  PE suspected abdominal pain EXAM: CT ANGIOGRAPHY CHEST WITH CONTRAST CT ABDOMEN PELVIS WITH CONTRAST TECHNIQUE: Multidetector CT imaging of the chest was performed using the standard protocol during bolus administration of intravenous contrast. Multiplanar CT image reconstructions and MIPs were obtained to evaluate the vascular anatomy. Multidetector CT imaging of the abdomen and pelvis was performed using the standard protocol following bolus administration of intravenous contrast. RADIATION DOSE REDUCTION: This exam was performed according to the departmental dose-optimization program which includes automated exposure control, adjustment of the mA and/or kV according to patient size and/or use of iterative reconstruction  technique. CONTRAST:  75mL  OMNIPAQUE IOHEXOL 350 MG/ML SOLN COMPARISON:  None Available. FINDINGS: CT CHEST ANGIOGRAM FINDINGS Cardiovascular: Satisfactory opacification of the pulmonary arteries to the segmental level. No evidence of pulmonary embolism. Normal heart size. Left coronary artery calcifications. No pericardial effusion. Aortic atherosclerosis. Irregular intramural thrombus within the lower thoracic aorta (series 5, image 13). Mediastinum/Nodes: No enlarged mediastinal, hilar, or axillary lymph nodes. Small calcified granulomatous mediastinal and hilar lymph nodes, sequelae of prior infection inflammation thyroid gland, trachea, and esophagus demonstrate no significant findings. Lungs/Pleura: Diffuse bilateral bronchial wall thickening. Diffuse, fine centrilobular and ground-glass nodularity throughout the lungs. Bibasilar scarring or atelectasis. No pleural effusion or pneumothorax. Musculoskeletal: No chest wall abnormality. No acute osseous findings. Review of the MIP images confirms the above findings. CT ABDOMEN PELVIS FINDINGS Hepatobiliary: No solid liver abnormality is seen. No gallstones, gallbladder wall thickening, or biliary dilatation. Pancreas: Unremarkable. No pancreatic ductal dilatation or surrounding inflammatory changes. Spleen: Normal in size without significant abnormality. Adrenals/Urinary Tract: Adrenal glands are unremarkable. Kidneys are normal, without renal calculi, solid lesion, or hydronephrosis. Bladder is unremarkable. Stomach/Bowel: Stomach is within normal limits. Appendix appears normal. No evidence of bowel wall thickening, distention, or inflammatory changes. Rectosigmoid colon resection Vascular/Lymphatic: Aortic atherosclerosis. No enlarged abdominal or pelvic lymph nodes. Reproductive: Status post hysterectomy Other: No abdominal wall hernia or abnormality. No ascites. Musculoskeletal: No acute or significant osseous findings. IMPRESSION: 1. Negative examination for pulmonary embolism. 2.  Diffuse bilateral bronchial wall thickening and diffuse, fine centrilobular and ground-glass nodularity throughout the lungs, consistent with nonspecific infectious or inflammatory bronchitis. 3. Severe aortic atherosclerosis, notable for irregular intramural thrombus within the lower thoracic aorta. 4. No acute CT findings of the abdomen or pelvis. 5. Coronary artery disease. Aortic Atherosclerosis (ICD10-I70.0). Electronically Signed   By: Jearld Lesch M.D.   On: 04/18/2023 17:58   DG Chest Portable 1 View  Result Date: 04/18/2023 CLINICAL DATA:  Syncope EXAM: PORTABLE CHEST 1 VIEW COMPARISON:  10/06/2022 FINDINGS: No focal airspace disease or pleural effusion. Stable cardiomediastinal silhouette with aortic atherosclerosis. No pneumothorax. Scoliosis of the spine. IMPRESSION: No active disease. Electronically Signed   By: Jasmine Pang M.D.   On: 04/18/2023 16:34      Subjective: Patient seen and examined the bedside today.  Comfortable.  Hemodynamically stable.  Lying in bed.  Alert, awake, obeys commands but confused.  Remains at baseline mentation.  Not agitated.  I called and discussed about discharge planning with her daughter on phone today.  Discharge Exam: Vitals:   04/19/23 0909 04/19/23 1232  BP: (!) 118/45 (!) 114/57  Pulse: 70 65  Resp: 16 16  Temp: 97.9 F (36.6 C) (!) 97.5 F (36.4 C)  SpO2: 97% 100%   Vitals:   04/18/23 2357 04/19/23 0414 04/19/23 0909 04/19/23 1232  BP: (!) 141/64 (!) 114/52 (!) 118/45 (!) 114/57  Pulse: 85 74 70 65  Resp: 16 16 16 16   Temp: 97.6 F (36.4 C) 98.2 F (36.8 C) 97.9 F (36.6 C) (!) 97.5 F (36.4 C)  TempSrc: Oral Oral Oral Oral  SpO2: 100% 94% 97% 100%    General: Pt is alert, awake, not in acute distress, pleasantly confused Cardiovascular: RRR, S1/S2 +, no rubs, no gallops Respiratory: CTA bilaterally, no wheezing, no rhonchi Abdominal: Soft, NT, ND, bowel sounds + Extremities: no edema, no cyanosis    The results of  significant diagnostics from this hospitalization (including imaging, microbiology, ancillary and laboratory) are listed below for reference.  Microbiology: No results found for this or any previous visit (from the past 240 hour(s)).   Labs: BNP (last 3 results) No results for input(s): "BNP" in the last 8760 hours. Basic Metabolic Panel: Recent Labs  Lab 04/18/23 1314 04/18/23 1526 04/19/23 0431  NA 139 140 141  K 4.0 4.3 4.0  CL 102 103 108  CO2 26  --  25  GLUCOSE 138* 105* 94  BUN 18 18 14   CREATININE 1.23* 1.10* 0.87  CALCIUM 8.9  --  8.7*  MG 2.1  --  1.9   Liver Function Tests: Recent Labs  Lab 04/18/23 1314 04/19/23 0431  AST 25 19  ALT 17 13  ALKPHOS 97 92  BILITOT 0.7 0.3  PROT 6.7 5.5*  ALBUMIN 3.3* 2.7*   Recent Labs  Lab 04/18/23 1314  LIPASE 43   No results for input(s): "AMMONIA" in the last 168 hours. CBC: Recent Labs  Lab 04/18/23 1314 04/18/23 1526 04/19/23 0431  WBC 9.8  --  8.0  NEUTROABS 7.5  --  5.1  HGB 11.9* 12.6 10.8*  HCT 37.7 37.0 33.2*  MCV 90.4  --  90.0  PLT 242  --  211   Cardiac Enzymes: Recent Labs  Lab 04/18/23 1314  CKTOTAL 51   BNP: Invalid input(s): "POCBNP" CBG: Recent Labs  Lab 04/18/23 1311  GLUCAP 130*   D-Dimer No results for input(s): "DDIMER" in the last 72 hours. Hgb A1c No results for input(s): "HGBA1C" in the last 72 hours. Lipid Profile No results for input(s): "CHOL", "HDL", "LDLCALC", "TRIG", "CHOLHDL", "LDLDIRECT" in the last 72 hours. Thyroid function studies Recent Labs    04/19/23 0431  TSH 2.026   Anemia work up Recent Labs    04/19/23 0447  VITAMINB12 172*   Urinalysis    Component Value Date/Time   COLORURINE STRAW (A) 04/18/2023 1315   APPEARANCEUR CLEAR 04/18/2023 1315   LABSPEC 1.016 04/18/2023 1315   PHURINE 7.0 04/18/2023 1315   GLUCOSEU NEGATIVE 04/18/2023 1315   GLUCOSEU NEGATIVE 04/23/2021 1458   HGBUR NEGATIVE 04/18/2023 1315   BILIRUBINUR NEGATIVE  04/18/2023 1315   KETONESUR NEGATIVE 04/18/2023 1315   PROTEINUR NEGATIVE 04/18/2023 1315   UROBILINOGEN 0.2 04/23/2021 1458   NITRITE NEGATIVE 04/18/2023 1315   LEUKOCYTESUR SMALL (A) 04/18/2023 1315   Sepsis Labs Recent Labs  Lab 04/18/23 1314 04/19/23 0431  WBC 9.8 8.0   Microbiology No results found for this or any previous visit (from the past 240 hour(s)).  Please note: You were cared for by a hospitalist during your hospital stay. Once you are discharged, your primary care physician will handle any further medical issues. Please note that NO REFILLS for any discharge medications will be authorized once you are discharged, as it is imperative that you return to your primary care physician (or establish a relationship with a primary care physician if you do not have one) for your post hospital discharge needs so that they can reassess your need for medications and monitor your lab values.    Time coordinating discharge: 40 minutes  SIGNED:   Burnadette Pop, MD  [facility] 04/19/2023, 1:34 PM Pager [phone]  If 7PM-7AM, please contact night-coverage [email or web address] Password [email or web address]

## 2023-04-19 NOTE — Evaluation (Signed)
Occupational Therapy Evaluation Patient Details Name: Brenda Hammond MRN: [id number] DOB: 06-Feb-1933 Today's Date: 04/19/2023   History of Present Illness Pt is a 89 y.o. female with medical history significant for dementia, paroxysmal atrial fibrillation, who is admitted to [facility] on 04/18/2023 with syncope after presenting from home to [facility] ED complaining of episode of loss of consciousness.   Clinical Impression   Romayne was evaluated s/p the above admission list. She lives at [place] with her spouse, at baseline staff assists with all cognitive aspects of care, she does not use AD and often "assists" her husband per daughters report. Upon evaluation the pt was limited by baseline cognition and unsteady balance. Overall she needed set up A and cues for all ADLs, and was able to mobility in the room with HHA. Pt was pleasantly confused throughout and followed all functional commands with repetition as needed. Pt's daughter present and assist and confirms she is at her baseline for ADLs and cognition, but is a little more unsteady than usual. Pt will benefit from continued acute OT services and d/c back to [facility] with familiar routine and staff/family.         If plan is discharge home, recommend the following: A little help with walking and/or transfers;A little help with bathing/dressing/bathroom;Assistance with cooking/housework;Direct supervision/assist for medications management;Direct supervision/assist for financial management;Assist for transportation;Help with stairs or ramp for entrance;Supervision due to cognitive status    Functional Status Assessment  Patient has had a recent decline in their functional status and demonstrates the ability to make significant improvements in function in a reasonable and predictable amount of time.  Equipment Recommendations  None recommended by OT       Precautions / Restrictions Precautions Precautions: Fall Precaution  Comments: syncope Restrictions Weight Bearing Restrictions: No      Mobility Bed Mobility               General bed mobility comments: OOB upon arrival    Transfers Overall transfer level: Needs assistance Equipment used: None               General transfer comment: pt unsteady, no DME to ambulate from sink to toilet and back. Daughter providing HHA      Balance Overall balance assessment: Needs assistance Sitting-balance support: Bilateral upper extremity supported Sitting balance-Leahy Scale: Fair Sitting balance - Comments: Pt able to maintain seated EOB with BUE support but occasional swaying, CGA for safety   Standing balance support: Single extremity supported, During functional activity Standing balance-Leahy Scale: Fair                             ADL either performed or assessed with clinical judgement   ADL Overall ADL's : Needs assistance/impaired           General ADL Comments: Pt bathing at the sink with assist of daughter upon arrival. pt reuqires set up assist and cues to complete. No DME used. Daughter comfirms this is her baseline minus being a bit more unsteady than usual     Vision Baseline Vision/History: 0 No visual deficits Vision Assessment?: No apparent visual deficits     Perception Perception: Within Functional Limits       Praxis Praxis: WFL       Pertinent Vitals/Pain Pain Assessment Pain Assessment: No/denies pain     Extremity/Trunk Assessment Upper Extremity Assessment Upper Extremity Assessment: Overall WFL for tasks assessed   Lower Extremity  Assessment Lower Extremity Assessment: Defer to PT evaluation   Cervical / Trunk Assessment Cervical / Trunk Assessment: Normal   Communication Communication Communication: No apparent difficulties Cueing Techniques: Verbal cues;Tactile cues   Cognition Arousal: Alert Behavior During Therapy: WFL for tasks assessed/performed Overall Cognitive Status:  History of cognitive impairments - at baseline                                 General Comments: Hx fo dementia. Pt's daughter present and reports current cognition is her baseline. Pt pleasantly confused and follow all functional commands with repetition as needed     General Comments  VSS on RA, family present            Home Living Family/patient expects to be discharged to:: Assisted living           Home Equipment: Other (comment) (unsure at this time)   Additional Comments: [facility] at [place], lives with spouse. Pt "assists" spouse.      Prior Functioning/Environment Prior Level of Function : Patient poor historian/Family not available;Needs assist  Cognitive Assist : Mobility (cognitive);ADLs (cognitive) Mobility (Cognitive): Set up cues ADLs (Cognitive): Set up cues       Mobility Comments: no AD, recent falls due to syncope ADLs Comments: set up assist and cues from staff; dtr states if she attempts ADLs herself they will be wrong.        OT Problem List: Impaired balance (sitting and/or standing);Decreased safety awareness         OT Goals(Current goals can be found in the care plan section) Acute Rehab OT Goals Patient Stated Goal: per dtr: back to [facility] OT Goal Formulation: With patient Time For Goal Achievement: 04/19/23 Potential to Achieve Goals: Good   AM-PAC OT "6 Clicks" Daily Activity     Outcome Measure Help from another person eating meals?: A Little Help from another person taking care of personal grooming?: A Little Help from another person toileting, which includes using toliet, bedpan, or urinal?: A Little Help from another person bathing (including washing, rinsing, drying)?: A Little Help from another person to put on and taking off regular upper body clothing?: A Little Help from another person to put on and taking off regular lower body clothing?: A Little 6 Click Score: 18   End of Session Nurse Communication:  Mobility status  Activity Tolerance: Patient tolerated treatment well Patient left: Other (comment) (at the sink with dtr)  OT Visit Diagnosis: Unsteadiness on feet (R26.81);Other abnormalities of gait and mobility (R26.89)                Time: 1041-1100 OT Time Calculation (min): 19 min Charges:  OT General Charges $OT Visit: 1 Visit OT Evaluation $OT Eval Low Complexity: 1 Low  Derenda Mis, OTR/L Acute Rehabilitation Services Office [phone] Secure Chat Communication Preferred   Donia Pounds 04/19/2023, 11:02 AM

## 2023-04-19 NOTE — Progress Notes (Signed)
Daughter expressed to this care RN that if patient remains at her current functional status. She would like to transport patient back to her facility instead of ambulance transport.

## 2023-04-19 NOTE — NC FL2 (Signed)
[place] MEDICAID FL2 LEVEL OF CARE FORM     IDENTIFICATION  Patient Name: Brenda Hammond Birthdate: [birth year] Sex: female Admission Date (Current Location): 04/18/2023  [facility] and [place] Number:  Producer, television/film/video and Address:  [facility], [address], [place], [place] [address]      Provider Number: [id number]  Attending Physician Name and Address:  Burnadette Pop, MD  Relative Name and Phone Number:       Current Level of Care: Hospital Recommended Level of Care: Memory Care Prior Approval Number:    Date Approved/Denied:   PASRR Number:    Discharge Plan: Other (Comment) (memory care)    Current Diagnoses: Patient Active Problem List   Diagnosis Date Noted   Generalized weakness 04/19/2023   Syncope 07/13/2022   IFG (impaired fasting glucose) 04/23/2021   Age-related osteoporosis without current pathological fracture 04/23/2021   Syncope and collapse 07/03/2020   Memory disorder 03/01/2019   Medicare annual wellness visit, subsequent 03/21/2017   Routine adult health maintenance 03/21/2017   History of atrial fibrillation 11/26/2015   Edema 11/26/2015   Right leg swelling 02/06/2014   Low back pain 12/06/2012   Osteopenia 12/06/2012   Vitamin D deficiency 12/06/2012   Malignant neoplasm of colon (HCC) 09/08/2009   ANEMIA 09/08/2009   Venous (peripheral) insufficiency 09/08/2009   GERD 09/08/2009   UTI 09/08/2009   Osteoarthritis 09/08/2009   OTHER DRUG ALLERGY 09/08/2009   Paroxysmal atrial fibrillation (HCC) 08/21/2009   Hyperlipidemia 10/30/2008   Anxiety state 10/30/2008   Fibromyalgia 10/30/2008   Idiopathic scoliosis and kyphoscoliosis 10/30/2008   POLIOMYELITIS, HX OF 10/30/2008    Orientation RESPIRATION BLADDER Height & Weight     Self  Normal Continent Weight:   Height:     BEHAVIORAL SYMPTOMS/MOOD NEUROLOGICAL BOWEL NUTRITION STATUS      Continent Diet (regular)  AMBULATORY STATUS  COMMUNICATION OF NEEDS Skin   Supervision Verbally Normal                       Personal Care Assistance Level of Assistance  Bathing, Feeding, Dressing Bathing Assistance: Limited assistance Feeding assistance: Independent Dressing Assistance: Limited assistance     Functional Limitations Info             SPECIAL CARE FACTORS FREQUENCY                       Contractures Contractures Info: Not present    Additional Factors Info  Code Status, Allergies Code Status Info: DNR Allergies Info: Sulfa Antibiotics, Alendronate, Ampicillin, Aspirin, Cyclobenzaprine Hcl, Metaxalone, Nitrofurantoin, Sulfonamide Derivatives, Aricept (Donepezil), Nortriptyline Hcl, Nsaids, Penicillins             Discharge Medications: TAKE these medications     bisacodyl 5 MG EC tablet Take 5 mg by mouth daily as needed (for constipation).    cyanocobalamin 1000 MCG tablet Take 1 tablet (1,000 mcg total) by mouth daily. Start taking on: April 20, 2023    lidocaine 2 % solution Commonly known as: XYLOCAINE Use as directed 1 mL in the mouth or throat See admin instructions. Rinse with 1 ml in the mouth every six hours as needed for sores. Rinse, then spit.    loratadine 10 MG tablet Commonly known as: CLARITIN Take 10 mg by mouth daily as needed for allergies.    melatonin 3 MG Tabs tablet Take 1 tablet (3 mg total) by mouth at bedtime as  needed (insomnia).    PSYLLIUM PO Take 0.4 g by mouth at bedtime.    Tylenol 8 Hour Arthritis Pain 650 MG CR tablet Generic drug: acetaminophen Take 650 mg by mouth every 12 (twelve) hours as needed for pain.    Relevant Imaging Results:  Relevant Lab Results:   Additional Information SS#: [id number]  Baldemar Lenis, LCSW

## 2023-04-19 NOTE — Evaluation (Signed)
Physical Therapy Evaluation Patient Details Name: Brenda Hammond MRN: [id number] DOB: 22-Nov-1932 Today's Date: 04/19/2023  History of Present Illness  Pt is a 89 y.o. female with medical history significant for dementia, paroxysmal atrial fibrillation, who is admitted to [facility] on 04/18/2023 with syncope after presenting from home to [facility] ED complaining of episode of loss of consciousness.  Clinical Impression  Pt is received in bed, she is agreeable to PT session. Unable to determine Pt's PLOF due to Pt's dementia dx and no family at bedside to further ask questions. Pt is oriented to self but confused throughout session. Assessed vitals for orthostatic per RN request-Pt demonstrates no orthostatic measurements. Pt able to perform bed mobility minA x2, transfers CGA x2, and amb minA for safety. Pt able to amb approx 120 ft using RW min A with one occurrence of LOB towards end of amb distance but able to correct with assist. Pt would benefit from skilled PT to address above deficits and promote optimal return to PLOF.        If plan is discharge home, recommend the following: A little help with walking and/or transfers;A little help with bathing/dressing/bathroom;Assistance with cooking/housework;Help with stairs or ramp for entrance;Assist for transportation;Supervision due to cognitive status   Can travel by private vehicle        Equipment Recommendations Rolling walker (2 wheels);Other (comment) (TBD at this time)  Recommendations for Other Services       Functional Status Assessment Patient has had a recent decline in their functional status and demonstrates the ability to make significant improvements in function in a reasonable and predictable amount of time.     Precautions / Restrictions Precautions Precautions: Fall Restrictions Weight Bearing Restrictions: No      Mobility  Bed Mobility Overal bed mobility: Needs Assistance Bed Mobility: Supine to  Sit, Sit to Supine     Supine to sit: Min assist, +2 for physical assistance Sit to supine: Min assist, +2 for physical assistance   General bed mobility comments: Pt able to perform bed mobility minA x2 for trunk and BLE management    Transfers Overall transfer level: Needs assistance Equipment used: Rolling walker (2 wheels) Transfers: Sit to/from Stand Sit to Stand: +2 physical assistance, Contact guard assist           General transfer comment: Pt able to perform STS from bed with RW but requires cuing for hand placement and technique; CGA x2 for stability and safety    Ambulation/Gait Ambulation/Gait assistance: Min assist Gait Distance (Feet): 120 Feet Assistive device: Rolling walker (2 wheels) Gait Pattern/deviations: Step-through pattern, Decreased stride length, Drifts right/left Gait velocity: Decreased     General Gait Details: Pt able to amb approx 120 ft using RW minA for AD management due to slight drifts R/L; one occurrence of LOB noted during amb but able able to correct with assist; Pt confused throughout session and frequent slight cuing for maintain AD closer to trunk  Stairs            Wheelchair Mobility     Tilt Bed    Modified Rankin (Stroke Patients Only)       Balance Overall balance assessment: Needs assistance Sitting-balance support: Bilateral upper extremity supported Sitting balance-Leahy Scale: Fair Sitting balance - Comments: Pt able to maintain seated EOB with BUE support but occasional swaying, CGA for safety   Standing balance support: Single extremity supported, During functional activity Standing balance-Leahy Scale: Fair Standing balance comment: Pt able to  maintain static and dynamic standing balance with use of RW but requires frequent cuing for hand placement                             Pertinent Vitals/Pain Pain Assessment Pain Assessment: No/denies pain    Home Living Family/patient expects to be  discharged to:: Assisted living                 Home Equipment: Other (comment) (unsure at this time) Additional Comments: Patient unable to answer PLOF and no family at baseline    Prior Function Prior Level of Function : Patient poor historian/Family not available             Mobility Comments: unable to attain PLOF information from Pt and no family at bedside ADLs Comments: may need intermittent supervision at baseline     Extremity/Trunk Assessment   Upper Extremity Assessment Upper Extremity Assessment: Overall WFL for tasks assessed    Lower Extremity Assessment Lower Extremity Assessment: Generalized weakness    Cervical / Trunk Assessment Cervical / Trunk Assessment: Normal  Communication   Communication Communication: No apparent difficulties Cueing Techniques: Verbal cues;Tactile cues  Cognition Arousal: Alert Behavior During Therapy: WFL for tasks assessed/performed Overall Cognitive Status: No family/caregiver present to determine baseline cognitive functioning                                 General Comments: AO to self but disoriented to place, time, and situation; Pleasant but very confused throughout session        General Comments      Exercises     Assessment/Plan    PT Assessment Patient needs continued PT services  PT Problem List Decreased strength;Decreased mobility;Decreased coordination;Decreased activity tolerance;Decreased balance;Decreased cognition       PT Treatment Interventions DME instruction;Gait training;Functional mobility training;Therapeutic activities;Therapeutic exercise;Balance training    PT Goals (Current goals can be found in the Care Plan section)  Acute Rehab PT Goals Patient Stated Goal: unable to state PT Goal Formulation: With patient Time For Goal Achievement: 05/03/23 Potential to Achieve Goals: Good    Frequency Min 1X/week     Co-evaluation               AM-PAC PT "6  Clicks" Mobility  Outcome Measure Help needed turning from your back to your side while in a flat bed without using bedrails?: A Little Help needed moving from lying on your back to sitting on the side of a flat bed without using bedrails?: A Little Help needed moving to and from a bed to a chair (including a wheelchair)?: A Little Help needed standing up from a chair using your arms (e.g., wheelchair or bedside chair)?: A Little   Help needed climbing 3-5 steps with a railing? : A Lot 6 Click Score: 14    End of Session Equipment Utilized During Treatment: Gait belt Activity Tolerance: Patient tolerated treatment well Patient left: in bed;with call bell/phone within reach;with bed alarm set;with SCD's reapplied Nurse Communication: Mobility status PT Visit Diagnosis: Unsteadiness on feet (R26.81);Muscle weakness (generalized) (M62.81)    Time: 1308-6578 PT Time Calculation (min) (ACUTE ONLY): 15 min   Charges:   PT Evaluation $PT Eval Low Complexity: 1 Low   PT General Charges $$ ACUTE PT VISIT: 1 Visit        Girl Schissler, PT, GCS 04/19/23,11:08 AM

## 2023-04-19 NOTE — TOC Transition Note (Signed)
Transition of Care [facility]) - CM/SW Discharge Note   Patient Details  Name: Brenda Hammond MRN: [id number] Date of Birth: 22-Apr-1933  [facility] of [facility] CM/SW Contact:  Baldemar Lenis, LCSW Phone Number: 04/19/2023, 2:11 PM   Clinical Narrative:   CSW updated by MD that patient is stable to return to memory care. CSW contacted [facility], they are able to accept patient back. CSW sent discharge information to [facility]. CSW spoke with daughter at bedside, she will transport. No further TOC needs.    Final next level of care: Memory Care Barriers to Discharge: Barriers Resolved   Patient Goals and CMS Choice CMS Medicare.gov Compare Post Acute Care list provided to:: Patient Represenative (must comment) Choice offered to / list presented to : Adult Children  Discharge Placement                Patient chooses bed at:  [facility]) Patient to be transferred to facility by: Daughter Name of family member notified: Daughter Patient and family notified of of transfer: 04/19/23  Discharge Plan and Services Additional resources added to the After Visit Summary for                                       Social Determinants of Health (SDOH) Interventions SDOH Screenings   Food Insecurity: No Food Insecurity (04/18/2023)  Housing: Low Risk  (04/18/2023)  Transportation Needs: No Transportation Needs (04/18/2023)  Utilities: Not At Risk (04/18/2023)  Depression (PHQ2-9): Low Risk  (10/13/2021)  Financial Resource Strain: Low Risk  (10/24/2019)  Tobacco Use: Low Risk  (04/19/2023)     Readmission Risk Interventions     No data to display

## 2023-05-16 DIAGNOSIS — M1990 Unspecified osteoarthritis, unspecified site: Secondary | ICD-10-CM | POA: Diagnosis not present

## 2023-05-16 DIAGNOSIS — K5904 Chronic idiopathic constipation: Secondary | ICD-10-CM | POA: Diagnosis not present

## 2023-05-20 DIAGNOSIS — G301 Alzheimer's disease with late onset: Secondary | ICD-10-CM | POA: Diagnosis not present

## 2023-06-13 DIAGNOSIS — L603 Nail dystrophy: Secondary | ICD-10-CM | POA: Diagnosis not present

## 2023-06-13 DIAGNOSIS — M810 Age-related osteoporosis without current pathological fracture: Secondary | ICD-10-CM | POA: Diagnosis not present

## 2023-06-13 DIAGNOSIS — M6281 Muscle weakness (generalized): Secondary | ICD-10-CM | POA: Diagnosis not present

## 2023-06-13 DIAGNOSIS — R238 Other skin changes: Secondary | ICD-10-CM | POA: Diagnosis not present

## 2023-06-13 DIAGNOSIS — I872 Venous insufficiency (chronic) (peripheral): Secondary | ICD-10-CM | POA: Diagnosis not present

## 2023-06-27 DIAGNOSIS — K5904 Chronic idiopathic constipation: Secondary | ICD-10-CM | POA: Diagnosis not present

## 2023-06-27 DIAGNOSIS — R7301 Impaired fasting glucose: Secondary | ICD-10-CM | POA: Diagnosis not present

## 2023-07-07 DIAGNOSIS — Z79899 Other long term (current) drug therapy: Secondary | ICD-10-CM | POA: Diagnosis not present

## 2023-07-07 DIAGNOSIS — E785 Hyperlipidemia, unspecified: Secondary | ICD-10-CM | POA: Diagnosis not present

## 2023-07-07 DIAGNOSIS — E559 Vitamin D deficiency, unspecified: Secondary | ICD-10-CM | POA: Diagnosis not present

## 2023-07-20 DIAGNOSIS — K5900 Constipation, unspecified: Secondary | ICD-10-CM | POA: Diagnosis not present

## 2023-07-20 DIAGNOSIS — R7301 Impaired fasting glucose: Secondary | ICD-10-CM | POA: Diagnosis not present

## 2023-07-21 DIAGNOSIS — M1990 Unspecified osteoarthritis, unspecified site: Secondary | ICD-10-CM | POA: Diagnosis not present

## 2023-07-22 DIAGNOSIS — R7301 Impaired fasting glucose: Secondary | ICD-10-CM | POA: Diagnosis not present

## 2023-07-22 DIAGNOSIS — E559 Vitamin D deficiency, unspecified: Secondary | ICD-10-CM | POA: Diagnosis not present

## 2023-08-08 DIAGNOSIS — K5904 Chronic idiopathic constipation: Secondary | ICD-10-CM | POA: Diagnosis not present

## 2023-08-08 DIAGNOSIS — M810 Age-related osteoporosis without current pathological fracture: Secondary | ICD-10-CM | POA: Diagnosis not present

## 2023-08-18 DIAGNOSIS — R262 Difficulty in walking, not elsewhere classified: Secondary | ICD-10-CM | POA: Diagnosis not present

## 2023-08-18 DIAGNOSIS — M810 Age-related osteoporosis without current pathological fracture: Secondary | ICD-10-CM | POA: Diagnosis not present

## 2023-08-18 DIAGNOSIS — B351 Tinea unguium: Secondary | ICD-10-CM | POA: Diagnosis not present

## 2023-08-18 DIAGNOSIS — M6281 Muscle weakness (generalized): Secondary | ICD-10-CM | POA: Diagnosis not present

## 2023-08-18 DIAGNOSIS — R238 Other skin changes: Secondary | ICD-10-CM | POA: Diagnosis not present

## 2023-08-18 DIAGNOSIS — I872 Venous insufficiency (chronic) (peripheral): Secondary | ICD-10-CM | POA: Diagnosis not present

## 2023-08-26 DIAGNOSIS — M1990 Unspecified osteoarthritis, unspecified site: Secondary | ICD-10-CM | POA: Diagnosis not present

## 2023-09-07 DIAGNOSIS — M1990 Unspecified osteoarthritis, unspecified site: Secondary | ICD-10-CM | POA: Diagnosis not present

## 2023-09-07 DIAGNOSIS — K5904 Chronic idiopathic constipation: Secondary | ICD-10-CM | POA: Diagnosis not present

## 2023-10-03 DIAGNOSIS — M150 Primary generalized (osteo)arthritis: Secondary | ICD-10-CM | POA: Diagnosis not present

## 2023-10-03 DIAGNOSIS — K5904 Chronic idiopathic constipation: Secondary | ICD-10-CM | POA: Diagnosis not present

## 2023-10-18 DIAGNOSIS — E782 Mixed hyperlipidemia: Secondary | ICD-10-CM | POA: Diagnosis not present

## 2023-10-18 DIAGNOSIS — I10 Essential (primary) hypertension: Secondary | ICD-10-CM | POA: Diagnosis not present

## 2023-10-20 DIAGNOSIS — M810 Age-related osteoporosis without current pathological fracture: Secondary | ICD-10-CM | POA: Diagnosis not present

## 2023-10-20 DIAGNOSIS — M6281 Muscle weakness (generalized): Secondary | ICD-10-CM | POA: Diagnosis not present

## 2023-10-20 DIAGNOSIS — I872 Venous insufficiency (chronic) (peripheral): Secondary | ICD-10-CM | POA: Diagnosis not present

## 2023-10-20 DIAGNOSIS — R238 Other skin changes: Secondary | ICD-10-CM | POA: Diagnosis not present

## 2023-10-20 DIAGNOSIS — L603 Nail dystrophy: Secondary | ICD-10-CM | POA: Diagnosis not present

## 2023-10-24 ENCOUNTER — Other Ambulatory Visit: Payer: Self-pay

## 2023-10-24 ENCOUNTER — Emergency Department (HOSPITAL_COMMUNITY)

## 2023-10-24 ENCOUNTER — Encounter (HOSPITAL_COMMUNITY): Payer: Self-pay | Admitting: Emergency Medicine

## 2023-10-24 ENCOUNTER — Inpatient Hospital Stay (HOSPITAL_COMMUNITY)
Admission: EM | Admit: 2023-10-24 | Discharge: 2023-10-28 | DRG: 177 | Disposition: A | Discharge status: Home Health | Source: Skilled Nursing Facility | Attending: Family Medicine | Admitting: Family Medicine

## 2023-10-24 DIAGNOSIS — F02C0 Dementia in other diseases classified elsewhere, severe, without behavioral disturbance, psychotic disturbance, mood disturbance, and anxiety: Secondary | ICD-10-CM | POA: Diagnosis present

## 2023-10-24 DIAGNOSIS — Z79899 Other long term (current) drug therapy: Secondary | ICD-10-CM | POA: Diagnosis not present

## 2023-10-24 DIAGNOSIS — R531 Weakness: Secondary | ICD-10-CM | POA: Diagnosis not present

## 2023-10-24 DIAGNOSIS — M6282 Rhabdomyolysis: Secondary | ICD-10-CM

## 2023-10-24 DIAGNOSIS — E785 Hyperlipidemia, unspecified: Secondary | ICD-10-CM | POA: Diagnosis present

## 2023-10-24 DIAGNOSIS — Z7901 Long term (current) use of anticoagulants: Secondary | ICD-10-CM

## 2023-10-24 DIAGNOSIS — Z880 Allergy status to penicillin: Secondary | ICD-10-CM

## 2023-10-24 DIAGNOSIS — R410 Disorientation, unspecified: Secondary | ICD-10-CM | POA: Diagnosis not present

## 2023-10-24 DIAGNOSIS — Z743 Need for continuous supervision: Secondary | ICD-10-CM | POA: Diagnosis not present

## 2023-10-24 DIAGNOSIS — M47816 Spondylosis without myelopathy or radiculopathy, lumbar region: Secondary | ICD-10-CM | POA: Diagnosis not present

## 2023-10-24 DIAGNOSIS — I480 Paroxysmal atrial fibrillation: Secondary | ICD-10-CM | POA: Diagnosis present

## 2023-10-24 DIAGNOSIS — M797 Fibromyalgia: Secondary | ICD-10-CM | POA: Diagnosis present

## 2023-10-24 DIAGNOSIS — G9341 Metabolic encephalopathy: Secondary | ICD-10-CM | POA: Diagnosis not present

## 2023-10-24 DIAGNOSIS — I672 Cerebral atherosclerosis: Secondary | ICD-10-CM | POA: Diagnosis not present

## 2023-10-24 DIAGNOSIS — I6782 Cerebral ischemia: Secondary | ICD-10-CM | POA: Diagnosis not present

## 2023-10-24 DIAGNOSIS — M160 Bilateral primary osteoarthritis of hip: Secondary | ICD-10-CM | POA: Diagnosis not present

## 2023-10-24 DIAGNOSIS — I10 Essential (primary) hypertension: Secondary | ICD-10-CM | POA: Diagnosis not present

## 2023-10-24 DIAGNOSIS — G309 Alzheimer's disease, unspecified: Secondary | ICD-10-CM | POA: Diagnosis not present

## 2023-10-24 DIAGNOSIS — Z66 Do not resuscitate: Secondary | ICD-10-CM | POA: Diagnosis present

## 2023-10-24 DIAGNOSIS — R9431 Abnormal electrocardiogram [ECG] [EKG]: Secondary | ICD-10-CM | POA: Diagnosis not present

## 2023-10-24 DIAGNOSIS — E872 Acidosis, unspecified: Secondary | ICD-10-CM | POA: Diagnosis not present

## 2023-10-24 DIAGNOSIS — U071 COVID-19: Principal | ICD-10-CM | POA: Diagnosis present

## 2023-10-24 DIAGNOSIS — R6889 Other general symptoms and signs: Secondary | ICD-10-CM | POA: Diagnosis not present

## 2023-10-24 DIAGNOSIS — R296 Repeated falls: Secondary | ICD-10-CM | POA: Diagnosis present

## 2023-10-24 DIAGNOSIS — M25551 Pain in right hip: Secondary | ICD-10-CM | POA: Diagnosis not present

## 2023-10-24 LAB — CBC WITH DIFFERENTIAL/PLATELET
Abs Immature Granulocytes: 0.05 10*3/uL (ref 0.00–0.07)
Basophils Absolute: 0 10*3/uL (ref 0.0–0.1)
Basophils Relative: 0 %
Eosinophils Absolute: 0 10*3/uL (ref 0.0–0.5)
Eosinophils Relative: 0 %
HCT: 41.6 % (ref 36.0–46.0)
Hemoglobin: 13.3 g/dL (ref 12.0–15.0)
Immature Granulocytes: 0 %
Lymphocytes Relative: 3 %
Lymphs Abs: 0.4 10*3/uL — ABNORMAL LOW (ref 0.7–4.0)
MCH: 29.1 pg (ref 26.0–34.0)
MCHC: 32 g/dL (ref 30.0–36.0)
MCV: 91 fL (ref 80.0–100.0)
Monocytes Absolute: 1 10*3/uL (ref 0.1–1.0)
Monocytes Relative: 9 %
Neutro Abs: 10.4 10*3/uL — ABNORMAL HIGH (ref 1.7–7.7)
Neutrophils Relative %: 88 %
Platelets: 239 10*3/uL (ref 150–400)
RBC: 4.57 MIL/uL (ref 3.87–5.11)
RDW: 13.3 % (ref 11.5–15.5)
WBC: 11.9 10*3/uL — ABNORMAL HIGH (ref 4.0–10.5)
nRBC: 0 % (ref 0.0–0.2)

## 2023-10-24 LAB — COMPREHENSIVE METABOLIC PANEL WITH GFR
ALT: 21 U/L (ref 0–44)
AST: 69 U/L — ABNORMAL HIGH (ref 15–41)
Albumin: 3.6 g/dL (ref 3.5–5.0)
Alkaline Phosphatase: 118 U/L (ref 38–126)
Anion gap: 10 (ref 5–15)
BUN: 13 mg/dL (ref 8–23)
CO2: 25 mmol/L (ref 22–32)
Calcium: 8.9 mg/dL (ref 8.9–10.3)
Chloride: 101 mmol/L (ref 98–111)
Creatinine, Ser: 0.91 mg/dL (ref 0.44–1.00)
GFR, Estimated: >60 mL/min (ref >60)
Glucose, Bld: 157 mg/dL — ABNORMAL HIGH (ref 70–99)
Potassium: 3.8 mmol/L (ref 3.5–5.1)
Sodium: 136 mmol/L (ref 135–145)
Total Bilirubin: 0.6 mg/dL (ref 0.0–1.2)
Total Protein: 7.2 g/dL (ref 6.5–8.1)

## 2023-10-24 LAB — URINALYSIS, W/ REFLEX TO CULTURE (INFECTION SUSPECTED)
Bacteria, UA: NONE SEEN
Bilirubin Urine: NEGATIVE
Glucose, UA: NEGATIVE mg/dL
Ketones, ur: 20 mg/dL — AB
Leukocytes,Ua: NEGATIVE
Nitrite: NEGATIVE
Protein, ur: 100 mg/dL — AB
Specific Gravity, Urine: 1.016 (ref 1.005–1.030)
pH: 6 (ref 5.0–8.0)

## 2023-10-24 LAB — RESP PANEL BY RT-PCR (RSV, FLU A&B, COVID)  RVPGX2
Influenza A by PCR: NEGATIVE
Influenza B by PCR: NEGATIVE
Resp Syncytial Virus by PCR: NEGATIVE
SARS Coronavirus 2 by RT PCR: POSITIVE — AB

## 2023-10-24 LAB — TROPONIN I (HIGH SENSITIVITY)
Troponin I (High Sensitivity): 10 ng/L (ref <18)
Troponin I (High Sensitivity): 14 ng/L (ref <18)

## 2023-10-24 LAB — CK: Total CK: 3705 U/L — ABNORMAL HIGH (ref 38–234)

## 2023-10-24 LAB — CBG MONITORING, ED: Glucose-Capillary: 136 mg/dL — ABNORMAL HIGH (ref 70–99)

## 2023-10-24 LAB — LIPASE, BLOOD: Lipase: 29 U/L (ref 11–51)

## 2023-10-24 MED ORDER — SODIUM CHLORIDE 0.9 % IV SOLN: INTRAVENOUS | Status: AC

## 2023-10-24 MED ORDER — ACETAMINOPHEN 650 MG RE SUPP: 650.0000 mg | Freq: Four times a day (QID) | RECTAL | Status: DC | PRN

## 2023-10-24 MED ORDER — ACETAMINOPHEN 325 MG PO TABS
650.0000 mg | ORAL_TABLET | Freq: Four times a day (QID) | ORAL | Status: DC | PRN
  Administered 2023-10-27: 650 mg via ORAL
  Filled 2023-10-24: qty 2

## 2023-10-24 MED ORDER — ENSURE ENLIVE PO LIQD
237.0000 mL | Freq: Three times a day (TID) | ORAL | Status: DC
  Administered 2023-10-25 – 2023-10-28 (×11): 237 mL via ORAL

## 2023-10-24 MED ORDER — NIRMATRELVIR/RITONAVIR (PAXLOVID) TABLET (RENAL DOSING)
2.0000 | ORAL_TABLET | Freq: Two times a day (BID) | ORAL | Status: DC
  Administered 2023-10-25 – 2023-10-28 (×8): 2 via ORAL
  Filled 2023-10-24: qty 20

## 2023-10-24 MED ORDER — HYDRALAZINE HCL 20 MG/ML IJ SOLN
5.0000 mg | Freq: Four times a day (QID) | INTRAMUSCULAR | Status: DC | PRN
  Administered 2023-10-27: 5 mg via INTRAVENOUS
  Filled 2023-10-24: qty 1

## 2023-10-24 MED ORDER — ENOXAPARIN SODIUM 40 MG/0.4ML IJ SOSY
40.0000 mg | PREFILLED_SYRINGE | INTRAMUSCULAR | Status: DC
Start: 2023-10-25 — End: 2023-10-26
  Administered 2023-10-25 – 2023-10-26 (×2): 40 mg via SUBCUTANEOUS
  Filled 2023-10-24 (×2): qty 0.4

## 2023-10-24 MED ORDER — NIRMATRELVIR/RITONAVIR (PAXLOVID)TABLET: 3.0000 | ORAL_TABLET | Freq: Two times a day (BID) | ORAL | Status: DC

## 2023-10-24 MED ORDER — SODIUM CHLORIDE 0.9 % IV BOLUS
1000.0000 mL | Freq: Once | INTRAVENOUS | Status: AC
  Administered 2023-10-24: 1000 mL via INTRAVENOUS

## 2023-10-24 MED ORDER — GUAIFENESIN-DM 100-10 MG/5ML PO SYRP: 5.0000 mL | ORAL_SOLUTION | ORAL | Status: DC | PRN

## 2023-10-24 NOTE — ED Provider Notes (Signed)
 [facility] [place] [facility] Provider Note   CSN: [id number] Arrival date & time: 10/24/23  1557     History  Chief Complaint  Patient presents with   Weakness    Brenda Hammond is a 89 y.o. female.  This is an 89 year old female presenting emergency department with generalized weakness.  Coming from [facility].  History of dementia.  Typically ambulatory and largely independent with ADLs.  Patient had progressive weakness and increased confusion as the days progressed.  Was found sitting on toilet after several hours.  Patient is pleasantly confused and cannot provide much history.  Complaining of pain to her right hip.  No reported fall or trauma.  She denies chest pain or shortness of breath.   Weakness      Home Medications Prior to Admission medications   Medication Sig Start Date End Date Taking? Authorizing Provider  bisacodyl 5 MG EC tablet Take 5 mg by mouth daily as needed (for constipation).    [provider]  cyanocobalamin  1000 MCG tablet Take 1 tablet (1,000 mcg total) by mouth daily. 04/20/23   Adhikari, Amrit, MD  lidocaine (XYLOCAINE) 2 % solution Use as directed 1 mL in the mouth or throat See admin instructions. Rinse with 1 ml in the mouth every six hours as needed for sores. Rinse, then spit.    [provider]  loratadine  (CLARITIN ) 10 MG tablet Take 10 mg by mouth daily as needed for allergies.    [provider]  melatonin 3 MG TABS tablet Take 1 tablet (3 mg total) by mouth at bedtime as needed (insomnia). 04/19/23   Leona Rake, MD  PSYLLIUM PO Take 0.4 g by mouth at bedtime.    [provider]  TYLENOL  8 HOUR ARTHRITIS PAIN 650 MG CR tablet Take 650 mg by mouth every 12 (twelve) hours as needed for pain.    [provider]      Allergies    Sulfa antibiotics, Alendronate , Ampicillin, Aspirin, Cyclobenzaprine hcl, Metaxalone, Nitrofurantoin,  Sulfonamide derivatives, Aricept  [donepezil ], Nortriptyline hcl, Nsaids, and Penicillins    Review of Systems   Review of Systems  Neurological:  Positive for weakness.    Physical Exam Updated Vital Signs BP (!) 164/66 (BP Location: Right Arm)   Pulse 75   Temp 98.5 F (36.9 C) (Oral)   Resp 18   SpO2 99%  Physical Exam Vitals and nursing note reviewed.  Constitutional:      General: She is not in acute distress.    Appearance: She is not toxic-appearing.  HENT:     Head: Normocephalic and atraumatic.     Nose: Nose normal.     Mouth/Throat:     Mouth: Mucous membranes are dry.  Eyes:     Conjunctiva/sclera: Conjunctivae normal.  Cardiovascular:     Rate and Rhythm: Normal rate and regular rhythm.  Pulmonary:     Effort: Pulmonary effort is normal.     Breath sounds: Normal breath sounds.  Abdominal:     General: Abdomen is flat. There is no distension.     Palpations: Abdomen is soft.     Tenderness: There is no abdominal tenderness. There is no guarding or rebound.  Musculoskeletal:        General: Tenderness present. Normal range of motion.     Comments: Some tenderness to the right hip.  Skin:    General: Skin is warm and dry.     Capillary Refill: Capillary  refill takes less than 2 seconds.  Neurological:     General: No focal deficit present.     Mental Status: She is alert. She is disoriented.  Psychiatric:        Mood and Affect: Mood normal.        Behavior: Behavior normal.     ED Results / Procedures / Treatments   Labs (all labs ordered are listed, but only abnormal results are displayed) Labs Reviewed  RESP PANEL BY RT-PCR (RSV, FLU A&B, COVID)  RVPGX2 - Abnormal; Notable for the following components:      Result Value   SARS Coronavirus 2 by RT PCR POSITIVE (*)    All other components within normal limits  CBC WITH DIFFERENTIAL/PLATELET - Abnormal; Notable for the following components:   WBC 11.9 (*)    Neutro Abs 10.4 (*)    Lymphs Abs 0.4  (*)    All other components within normal limits  COMPREHENSIVE METABOLIC PANEL WITH GFR - Abnormal; Notable for the following components:   Glucose, Bld 157 (*)    AST 69 (*)    All other components within normal limits  URINALYSIS, W/ REFLEX TO CULTURE (INFECTION SUSPECTED) - Abnormal; Notable for the following components:   Hgb urine dipstick LARGE (*)    Ketones, ur 20 (*)    Protein, ur 100 (*)    All other components within normal limits  CK - Abnormal; Notable for the following components:   Total CK 3,705 (*)    All other components within normal limits  CBG MONITORING, ED - Abnormal; Notable for the following components:   Glucose-Capillary 136 (*)    All other components within normal limits  LIPASE, BLOOD  TROPONIN I (HIGH SENSITIVITY)  TROPONIN I (HIGH SENSITIVITY)    EKG None  Radiology DG Pelvis 1-2 Views Result Date: 10/24/2023 CLINICAL DATA:  Right hip pain and weakness for 1 day. EXAM: PELVIS - 1-2 VIEW COMPARISON:  08/17/2022 FINDINGS: Mild degenerative changes in the lower lumbar spine and in both hips. No focal bone lesion or bone destruction identified. No acute displaced fractures. SI joints and symphysis pubis are not displaced. Soft tissues are unremarkable. IMPRESSION: Mild degenerative changes in the hips.  No acute bony abnormalities. Electronically Signed   By: Boyce Byes M.D.   On: 10/24/2023 19:27   CT Head Wo Contrast Result Date: 10/24/2023 CLINICAL DATA:  Mental status change EXAM: CT HEAD WITHOUT CONTRAST TECHNIQUE: Contiguous axial images were obtained from the base of the skull through the vertex without intravenous contrast. RADIATION DOSE REDUCTION: This exam was performed according to the departmental dose-optimization program which includes automated exposure control, adjustment of the mA and/or kV according to patient size and/or use of iterative reconstruction technique. COMPARISON:  CT head 04/18/2023 and MRI head 04/19/2023 FINDINGS:  Brain: No intracranial hemorrhage, mass effect, or evidence of acute infarct. No hydrocephalus. No extra-axial fluid collection. Generalized cerebral atrophy and chronic small vessel ischemic disease. Vascular: No hyperdense vessel. Intracranial arterial calcification. Skull: No fracture or focal lesion. Sinuses/Orbits: No acute finding. Other: None. IMPRESSION: No acute intracranial abnormality. Electronically Signed   By: Rozell Cornet M.D.   On: 10/24/2023 18:41   DG Chest Portable 1 View Result Date: 10/24/2023 CLINICAL DATA:  Altered mental status. EXAM: PORTABLE CHEST 1 VIEW COMPARISON:  04/18/2023. FINDINGS: Bilateral lung fields are clear. Bilateral costophrenic angles are clear. Normal cardio-mediastinal silhouette. No acute osseous abnormalities. The soft tissues are within normal limits. IMPRESSION: No active disease.  Electronically Signed   By: Beula Brunswick M.D.   On: 10/24/2023 17:30    Procedures Procedures    Medications Ordered in ED Medications  sodium chloride  0.9 % bolus 1,000 mL (0 mLs Intravenous Stopped 10/24/23 2052)    ED Course/ Medical Decision Making/ A&P Clinical Course as of 10/24/23 2325  Mon Oct 24, 2023  1814 Last echo with normal EF.  [TY]  2114 SARS Coronavirus 2 by RT PCR(!): POSITIVE [TY]  2114 CK Total(!): 3,705 [TY]    Clinical Course User Index [TY] Rolinda Climes, DO                                 Medical Decision Making This is an 89 year old female with Alzheimer's dementia, A-fib, fibromyalgia, scoliosis presenting to the emergency department for generalized weakness and altered mental status/delirium.  She is pleasantly confused with a delirium type picture.  She has no localizing neurodeficits on exam.  Daughter who is a Engineer, civil (consulting) here at [facility] is at bedside stated that she is quite active, alert at her facility and with only mild cognitive impairment.  Daughter notes that mother is still confused and not at her baseline.  No reported  trauma, no trauma on exam.  Broad workup obtained concern for acute intracranial pathology.  However CT head negative.  Daughter did note that flu and COVID was going around the facility.  Patient is positive for flu.  Does have a minor white count which would coincide with viral illness such as flu.  Does not appear to have superimposed pneumonia or bacterial infection she has no significant metabolic derangements, normal kidney function.  However does have a significantly elevated lactate at 3700.  Received IV fluids.  Given patient's delirium in the setting of COVID with elevated CK will admit for further IV fluids and monitoring.  Amount and/or Complexity of Data Reviewed Labs: ordered. Decision-making details documented in ED Course. Radiology: ordered. ECG/medicine tests: ordered.  Risk Decision regarding hospitalization.         Final Clinical Impression(s) / ED Diagnoses Final diagnoses:  None    Rx / DC Orders ED Discharge Orders     None         Rolinda Climes, DO 10/24/23 2325

## 2023-10-24 NOTE — Plan of Care (Signed)
  Problem: Nutrition: Goal: Adequate nutrition will be maintained Outcome: Progressing   Problem: Safety: Goal: Ability to remain free from injury will improve Outcome: Progressing   

## 2023-10-24 NOTE — H&P (Signed)
 History and Physical    Brenda Hammond BMW:[id number] DOB: August 23, 1932 DOA: 10/24/2023  PCP: Brendalyn Calkins, MD  Chief Complaint: Generalized weakness  HPI: Brenda Hammond is a 89 y.o. female with medical history significant of dementia, paroxysmal A-fib not on anticoagulation or any AV nodal blocking agents, B12 deficiency, anemia, anxiety, history of colon cancer, GERD, hyperlipidemia, venous insufficiency presents to the ED via EMS from her nursing facility for evaluation of generalized weakness.  Patient is oriented to self only and history provided by her daughter at bedside.  Daughter states at baseline patient is oriented to self only but is able to have conversations with people and recognizes family members.  Although she does not like eating regular food, she does enjoy eating desserts and drinks chocolate or strawberry flavored Ensure 3 times a day.  Today her facility noticed that the patient was having runny nose and cough.  She appeared more lethargic/not talking much and did not want to eat breakfast.  At lunchtime when staff at the facility went to check on the patient, she was found on the commode bent forward but was conscious and able to talk to them.  Daughter states it took 3 people to get the patient up from the commode as she was too weak to get up on her own.  No falls reported.  No vomiting, diarrhea, or any other symptoms reported.  Daughter states patient has history of fibromyalgia and colon cancer status post bowel resection 14 years ago.  No history of hypertension.  ED Course: Hypertensive but remainder of vital signs stable.  Labs notable for WBC count 11.9, hemoglobin 13.3, glucose 157, creatinine 0.9, AST 69 and remainder of LFTs normal, lipase normal, CK 3705, troponin negative x 2, SARS-CoV-2 PCR positive, UA not suggestive of infection.  Chest x-ray showing no active disease.  CT head negative for acute intracranial abnormality.  X-ray of pelvis negative  for fracture.  EKG showing sinus rhythm, borderline short PR interval, and no acute ischemic changes. Patient was given 1 L normal saline.  Review of Systems:  Review of Systems  All other systems reviewed and are negative.   Past Medical History:  Diagnosis Date   Anemia    Anxiety    Atrial fibrillation (HCC)    Colon adenocarcinoma (HCC)    DJD (degenerative joint disease)    Fibromyalgia    GERD (gastroesophageal reflux disease)    History of poliomyelitis    History of UTI    Hyperlipemia    Memory disorder 03/01/2019   Other drug allergy(995.27)    Scoliosis    Surgical or other procedure not carried out because of patient's decision    Venous insufficiency     Past Surgical History:  Procedure Laterality Date   APPENDECTOMY     LOW ANTERIOR BOWEL RESECTION  06/2009   with anastomosis for colon cancer   OTHER SURGICAL HISTORY     T & A   TOTAL ABDOMINAL HYSTERECTOMY W/ BILATERAL SALPINGOOPHORECTOMY  1980s     reports that she has never smoked. She has never used smokeless tobacco. She reports that she does not drink alcohol and does not use drugs.  Allergies  Allergen Reactions   Sulfa Antibiotics Anaphylaxis, Hypertension and Other (See Comments)    Elevated HR and BP, also and "ALLERGIC," per [facility]  REACTION: elevated HR and BP   Alendronate  Other (See Comments)    "twitching" and "ALLERGIC," per [facility]   Ampicillin Other (See Comments)  High fever and "ALLERGIC," per [facility]   Aspirin Other (See Comments)    Made the ears ring and "ALLERGIC," per [facility]   Cyclobenzaprine Hcl Other (See Comments)    Nightmares- "ALLERGIC," per [facility]   Metaxalone Other (See Comments)    Nightmares- "ALLERGIC," per [facility]   Nitrofurantoin Other (See Comments) and Hypertension    Elevated B/P and chest pain- "ALLERGIC," per [facility]   Sulfonamide Derivatives Other (See Comments) and Hypertension    Elevated HR and B/P- "ALLERGIC," per [facility]   Aricept  [Donepezil ] Rash and Other (See Comments)     Dizziness and cramping also- "ALLERGIC," per [facility]   Nortriptyline Hcl Rash and Other (See Comments)    "ALLERGIC," per [facility]   Nsaids Rash and Other (See Comments)    "ALLERGIC," per [facility]   Penicillins Rash and Other (See Comments)    High fever, too- "ALLERGIC," per [facility]    Family History  Problem Relation Age of Onset   Stroke Father    Cancer Mother        brain   Colon cancer Neg Hx     Prior to Admission medications   Medication Sig Start Date End Date Taking? Authorizing Provider  bisacodyl 5 MG EC tablet Take 5 mg by mouth daily as needed (for constipation).    [provider]  cyanocobalamin  1000 MCG tablet Take 1 tablet (1,000 mcg total) by mouth daily. 04/20/23   Adhikari, Amrit, MD  lidocaine (XYLOCAINE) 2 % solution Use as directed 1 mL in the mouth or throat See admin instructions. Rinse with 1 ml in the mouth every six hours as needed for sores. Rinse, then spit.    [provider]  loratadine  (CLARITIN ) 10 MG tablet Take 10 mg by mouth daily as needed for allergies.    [provider]  melatonin 3 MG TABS tablet Take 1 tablet (3 mg total) by mouth at bedtime as needed (insomnia). 04/19/23   Leona Rake, MD  PSYLLIUM PO Take 0.4 g by mouth at bedtime.    [provider]  TYLENOL  8 HOUR ARTHRITIS PAIN 650 MG CR tablet Take 650 mg by mouth every 12 (twelve) hours as needed for pain.    [provider]    Physical Exam: Vitals:   10/24/23 1613 10/24/23 1900 10/24/23 2200 10/24/23 2238  BP: (!) 159/86 (!) 173/77 (!) 166/76 (!) 164/66  Pulse: 79 85 78 75  Resp: 16 (!) 23 16 18   Temp: 98 F (36.7 C) 98.3 F (36.8 C)  98.5 F (36.9 C)  TempSrc:    Oral  SpO2: 100% 98% 96% 99%    Physical Exam Vitals reviewed.  Constitutional:      General: She is not in acute distress. HENT:     Head: Normocephalic and atraumatic.     Mouth/Throat:     Mouth: Mucous membranes are dry.  Cardiovascular:     Rate and Rhythm: Normal  rate and regular rhythm.     Pulses: Normal pulses.  Pulmonary:     Effort: Pulmonary effort is normal. No respiratory distress.     Breath sounds: Normal breath sounds. No wheezing, rhonchi or rales.  Abdominal:     General: Bowel sounds are normal. There is no distension.     Palpations: Abdomen is soft.     Tenderness: There is no abdominal tenderness. There is no guarding.  Musculoskeletal:     Cervical back: Normal range of motion.     Right lower leg: No  edema.     Left lower leg: No edema.  Skin:    General: Skin is warm and dry.  Neurological:     General: No focal deficit present.     Mental Status: She is alert.     Sensory: No sensory deficit.     Motor: No weakness.     Labs on Admission: I have personally reviewed following labs and imaging studies  CBC: Recent Labs  Lab 10/24/23 1700  WBC 11.9*  NEUTROABS 10.4*  HGB 13.3  HCT 41.6  MCV 91.0  PLT 239   Basic Metabolic Panel: Recent Labs  Lab 10/24/23 1700  NA 136  K 3.8  CL 101  CO2 25  GLUCOSE 157*  BUN 13  CREATININE 0.91  CALCIUM  8.9   GFR: CrCl cannot be calculated (Unknown ideal weight.). Liver Function Tests: Recent Labs  Lab 10/24/23 1700  AST 69*  ALT 21  ALKPHOS 118  BILITOT 0.6  PROT 7.2  ALBUMIN 3.6   Recent Labs  Lab 10/24/23 1700  LIPASE 29   No results for input(s): "AMMONIA" in the last 168 hours. Coagulation Profile: No results for input(s): "INR", "PROTIME" in the last 168 hours. Cardiac Enzymes: Recent Labs  Lab 10/24/23 1700  CKTOTAL 3,705*   BNP (last 3 results) No results for input(s): "PROBNP" in the last 8760 hours. HbA1C: No results for input(s): "HGBA1C" in the last 72 hours. CBG: Recent Labs  Lab 10/24/23 1917  GLUCAP 136*   Lipid Profile: No results for input(s): "CHOL", "HDL", "LDLCALC", "TRIG", "CHOLHDL", "LDLDIRECT" in the last 72 hours. Thyroid  Function Tests: No results for input(s): "TSH", "T4TOTAL", "FREET4", "T3FREE", "THYROIDAB"  in the last 72 hours. Anemia Panel: No results for input(s): "VITAMINB12", "FOLATE", "FERRITIN", "TIBC", "IRON", "RETICCTPCT" in the last 72 hours. Urine analysis:    Component Value Date/Time   COLORURINE YELLOW 10/24/2023 2023   APPEARANCEUR CLEAR 10/24/2023 2023   LABSPEC 1.016 10/24/2023 2023   PHURINE 6.0 10/24/2023 2023   GLUCOSEU NEGATIVE 10/24/2023 2023   GLUCOSEU NEGATIVE 04/23/2021 1458   HGBUR LARGE (A) 10/24/2023 2023   BILIRUBINUR NEGATIVE 10/24/2023 2023   KETONESUR 20 (A) 10/24/2023 2023   PROTEINUR 100 (A) 10/24/2023 2023   UROBILINOGEN 0.2 04/23/2021 1458   NITRITE NEGATIVE 10/24/2023 2023   LEUKOCYTESUR NEGATIVE 10/24/2023 2023    Radiological Exams on Admission: DG Pelvis 1-2 Views Result Date: 10/24/2023 CLINICAL DATA:  Right hip pain and weakness for 1 day. EXAM: PELVIS - 1-2 VIEW COMPARISON:  08/17/2022 FINDINGS: Mild degenerative changes in the lower lumbar spine and in both hips. No focal bone lesion or bone destruction identified. No acute displaced fractures. SI joints and symphysis pubis are not displaced. Soft tissues are unremarkable. IMPRESSION: Mild degenerative changes in the hips.  No acute bony abnormalities. Electronically Signed   By: Boyce Byes M.D.   On: 10/24/2023 19:27   CT Head Wo Contrast Result Date: 10/24/2023 CLINICAL DATA:  Mental status change EXAM: CT HEAD WITHOUT CONTRAST TECHNIQUE: Contiguous axial images were obtained from the base of the skull through the vertex without intravenous contrast. RADIATION DOSE REDUCTION: This exam was performed according to the departmental dose-optimization program which includes automated exposure control, adjustment of the mA and/or kV according to patient size and/or use of iterative reconstruction technique. COMPARISON:  CT head 04/18/2023 and MRI head 04/19/2023 FINDINGS: Brain: No intracranial hemorrhage, mass effect, or evidence of acute infarct. No hydrocephalus. No extra-axial fluid collection.  Generalized cerebral atrophy and chronic small vessel  ischemic disease. Vascular: No hyperdense vessel. Intracranial arterial calcification. Skull: No fracture or focal lesion. Sinuses/Orbits: No acute finding. Other: None. IMPRESSION: No acute intracranial abnormality. Electronically Signed   By: Rozell Cornet M.D.   On: 10/24/2023 18:41   DG Chest Portable 1 View Result Date: 10/24/2023 CLINICAL DATA:  Altered mental status. EXAM: PORTABLE CHEST 1 VIEW COMPARISON:  04/18/2023. FINDINGS: Bilateral lung fields are clear. Bilateral costophrenic angles are clear. Normal cardio-mediastinal silhouette. No acute osseous abnormalities. The soft tissues are within normal limits. IMPRESSION: No active disease. Electronically Signed   By: Beula Brunswick M.D.   On: 10/24/2023 17:30    Assessment and Plan  COVID infection Patient is having rhinorrhea and cough since this morning.  No fever, tachycardia, or signs of sepsis.  Not hypoxic.  Chest x-ray not suggestive of pneumonia.  Start 5-day course of Paxlovid.  Antitussive as needed.  No indication for steroids at this time.  Airborne and contact precautions.  Acute rhabdomyolysis Likely related to COVID infection.  No falls or trauma reported.  CK 3705.  No signs of acute kidney injury.  Continue IV fluid hydration.  Trend CK and monitor renal function.  Acute metabolic encephalopathy in the setting of baseline dementia COVID infection likely contributing.  CT head negative for acute intracranial abnormality and no focal neurodeficit on exam.  At baseline, patient is oriented to self only.  Currently somnolent but easily wakes up and talks to her daughter.  Generalized weakness/physical deconditioning In the setting of problems listed above.  PT/OT eval, fall precautions.  Hypertension Patient is not on antihypertensives.  Blood pressure elevated with SBP currently in 160s.  IV hydralazine PRN SBP >160.  Mild transaminase elevation Likely due to  rhabdomyolysis.  Trend LFTs.  Paroxysmal A-fib Currently in sinus rhythm.  Patient is not on anticoagulation or any AV nodal blocking agents.  DVT prophylaxis: Lovenox  Code Status: DNR/DNI (discussed with the patient's daughter) Family Communication: Daughter at bedside. Level of care: Telemetry bed Admission status: It is my clinical opinion that referral for OBSERVATION is reasonable and necessary in this patient based on the above information provided. The aforementioned taken together are felt to place the patient at high risk for further clinical deterioration. However, it is anticipated that the patient may be medically stable for discharge from the hospital within 24 to 48 hours.  Juliette Oh MD [facility]  If 7PM-7AM, please contact night-coverage [email or web address]  10/24/2023, 10:43 PM

## 2023-10-24 NOTE — ED Triage Notes (Signed)
 Patient BIB EMS from [facility] c/o weakness x1day. Per report patient was found in her BSC unable to get up. No report of fall. Family report patient normally alert and ambulatory on her room. No report N/V.

## 2023-10-25 DIAGNOSIS — E785 Hyperlipidemia, unspecified: Secondary | ICD-10-CM | POA: Diagnosis present

## 2023-10-25 DIAGNOSIS — E872 Acidosis, unspecified: Secondary | ICD-10-CM | POA: Diagnosis present

## 2023-10-25 DIAGNOSIS — Z79899 Other long term (current) drug therapy: Secondary | ICD-10-CM | POA: Diagnosis not present

## 2023-10-25 DIAGNOSIS — I480 Paroxysmal atrial fibrillation: Secondary | ICD-10-CM | POA: Diagnosis not present

## 2023-10-25 DIAGNOSIS — M797 Fibromyalgia: Secondary | ICD-10-CM | POA: Diagnosis present

## 2023-10-25 DIAGNOSIS — I10 Essential (primary) hypertension: Secondary | ICD-10-CM | POA: Diagnosis present

## 2023-10-25 DIAGNOSIS — U071 COVID-19: Secondary | ICD-10-CM | POA: Diagnosis not present

## 2023-10-25 DIAGNOSIS — G9341 Metabolic encephalopathy: Secondary | ICD-10-CM | POA: Diagnosis present

## 2023-10-25 DIAGNOSIS — R296 Repeated falls: Secondary | ICD-10-CM | POA: Diagnosis present

## 2023-10-25 DIAGNOSIS — Z7901 Long term (current) use of anticoagulants: Secondary | ICD-10-CM | POA: Diagnosis not present

## 2023-10-25 DIAGNOSIS — M6282 Rhabdomyolysis: Secondary | ICD-10-CM | POA: Diagnosis present

## 2023-10-25 DIAGNOSIS — F02C0 Dementia in other diseases classified elsewhere, severe, without behavioral disturbance, psychotic disturbance, mood disturbance, and anxiety: Secondary | ICD-10-CM | POA: Diagnosis present

## 2023-10-25 DIAGNOSIS — Z880 Allergy status to penicillin: Secondary | ICD-10-CM | POA: Diagnosis not present

## 2023-10-25 DIAGNOSIS — Z66 Do not resuscitate: Secondary | ICD-10-CM | POA: Diagnosis present

## 2023-10-25 DIAGNOSIS — G309 Alzheimer's disease, unspecified: Secondary | ICD-10-CM | POA: Diagnosis present

## 2023-10-25 LAB — COMPREHENSIVE METABOLIC PANEL WITH GFR
ALT: 45 U/L — ABNORMAL HIGH (ref 0–44)
AST: 239 U/L — ABNORMAL HIGH (ref 15–41)
Albumin: 2.7 g/dL — ABNORMAL LOW (ref 3.5–5.0)
Alkaline Phosphatase: 91 U/L (ref 38–126)
Anion gap: 7 (ref 5–15)
BUN: 16 mg/dL (ref 8–23)
CO2: 23 mmol/L (ref 22–32)
Calcium: 8.3 mg/dL — ABNORMAL LOW (ref 8.9–10.3)
Chloride: 108 mmol/L (ref 98–111)
Creatinine, Ser: 0.81 mg/dL (ref 0.44–1.00)
GFR, Estimated: >60 mL/min (ref >60)
Glucose, Bld: 140 mg/dL — ABNORMAL HIGH (ref 70–99)
Potassium: 3.9 mmol/L (ref 3.5–5.1)
Sodium: 138 mmol/L (ref 135–145)
Total Bilirubin: 0.5 mg/dL (ref 0.0–1.2)
Total Protein: 5.8 g/dL — ABNORMAL LOW (ref 6.5–8.1)

## 2023-10-25 LAB — CBC
HCT: 36.2 % (ref 36.0–46.0)
Hemoglobin: 11.6 g/dL — ABNORMAL LOW (ref 12.0–15.0)
MCH: 29.3 pg (ref 26.0–34.0)
MCHC: 32 g/dL (ref 30.0–36.0)
MCV: 91.4 fL (ref 80.0–100.0)
Platelets: 198 10*3/uL (ref 150–400)
RBC: 3.96 MIL/uL (ref 3.87–5.11)
RDW: 13.5 % (ref 11.5–15.5)
WBC: 9.8 10*3/uL (ref 4.0–10.5)
nRBC: 0 % (ref 0.0–0.2)

## 2023-10-25 LAB — CK: Total CK: 10812 U/L — ABNORMAL HIGH (ref 38–234)

## 2023-10-25 MED ORDER — METOPROLOL TARTRATE 5 MG/5ML IV SOLN
2.5000 mg | Freq: Once | INTRAVENOUS | Status: AC | PRN
  Administered 2023-10-25: 2.5 mg via INTRAVENOUS

## 2023-10-25 MED ORDER — SODIUM CHLORIDE 0.9 % IV SOLN: INTRAVENOUS | Status: AC

## 2023-10-25 MED ORDER — HALOPERIDOL LACTATE 5 MG/ML IJ SOLN: 2.0000 mg | Freq: Once | INTRAMUSCULAR | Status: DC | PRN

## 2023-10-25 MED ORDER — METOPROLOL TARTRATE 5 MG/5ML IV SOLN
2.5000 mg | Freq: Once | INTRAVENOUS | Status: AC | PRN
  Administered 2023-10-25: 2.5 mg via INTRAVENOUS
  Filled 2023-10-25: qty 5

## 2023-10-25 NOTE — Progress Notes (Signed)
 PROGRESS NOTE  Brenda Hammond MVH:[id number] DOB: 07-01-1932 DOA: 10/24/2023 PCP: Brendalyn Calkins, MD  HPI/Recap of past 24 hours: Brenda Hammond is a 89 y.o. female with medical history significant of dementia, paroxysmal A-fib not on AC or any AV nodal blocking agents, history of colon cancer, GERD, hyperlipidemia, presents to the ED via EMS from her nursing facility for evaluation of generalized weakness.  Patient is oriented to self only and history provided by her daughter at bedside.  Daughter states at baseline patient is oriented to self only but is able to have conversations with people and recognizes family members. PTA, her facility noticed that the patient was having runny nose and cough, appeared more lethargic, unable to ambulate with poor appetite. In the ED, pt VSS. Labs notable for AST 69, CK 3705, troponin negative x 2, SARS-CoV-2 PCR positive, UA not suggestive of infection.  Chest x-ray showing no active disease.  CT head negative for acute intracranial abnormality.  X-ray of pelvis negative for fracture.  EKG unremarkable.  Patient admitted for further management.    Today, patient remains confused, able to say her name only.  Does not appear lethargic, pleasantly confused.  OT at bedside.    Assessment/Plan: Principal Problem:   COVID-19 virus infection Active Problems:   Generalized weakness   Rhabdomyolysis   Acute metabolic encephalopathy   Essential hypertension   COVID  COVID infection No fever, tachycardia, or signs of sepsis Not hypoxic, saturating well on RA Chest x-ray not suggestive of pneumonia No indication for steroids at this time Continue Paxlovid Supportive management   Acute rhabdomyolysis CK 3705-->10,812, will trend Likely related to COVID infection, no falls or trauma reported CT head unremarkable as well as x-ray of pelvis Continue IVF and monitor respiratory status very closely  Mild transaminitis Likely 2/2  above Management as above Daily CMP  Hypertension Patient is not on antihypertensives IV hydralazine PRN SBP >160  Paroxysmal A-fib Currently in sinus rhythm Patient is not on anticoagulation or any AV nodal blocking agents  Acute metabolic encephalopathy in the setting of baseline dementia Improving COVID infection likely contributing CT head negative for acute intracranial abnormality and no focal neurodeficit on exam At baseline, patient is oriented to self only   Generalized weakness/physical deconditioning PT/OT eval Fall precautions.        Estimated body mass index is 22.33 kg/m as calculated from the following:   Height as of this encounter: 5\' 1"  (1.549 m).   Weight as of this encounter: 53.6 kg.     Code Status: DNR  Family Communication: None at bedside  Disposition Plan: Status is: Inpatient Remains inpatient appropriate because: Level of care      Consultants: None  Procedures: None  Antimicrobials: Paxlovid  DVT prophylaxis: Lovenox    Objective: Vitals:   10/24/23 2300 10/25/23 0314 10/25/23 0631 10/25/23 1212  BP:  (!) 158/139 119/63 (!) 125/59  Pulse:  85 75 80  Resp:  18 18   Temp:  99.7 F (37.6 C) 98.5 F (36.9 C) 98.9 F (37.2 C)  TempSrc:  Oral Oral Oral  SpO2:  99% 97% 100%  Weight: 53.6 kg     Height: 5\' 1"  (1.549 m)       Intake/Output Summary (Last 24 hours) at 10/25/2023 1647 Last data filed at 10/24/2023 2052 Gross per 24 hour  Intake 1000 ml  Output --  Net 1000 ml   Filed Weights   10/24/23 2300  Weight: 53.6 kg  Exam: General: NAD  Cardiovascular: S1, S2 present Respiratory: CTAB Abdomen: Soft, nontender, nondistended, bowel sounds present Musculoskeletal: No bilateral pedal edema noted Skin: Normal Psychiatry: Unable to assess    Data Reviewed: CBC: Recent Labs  Lab 10/24/23 1700 10/25/23 0531  WBC 11.9* 9.8  NEUTROABS 10.4*  --   HGB 13.3 11.6*  HCT 41.6 36.2  MCV 91.0 91.4  PLT  239 198   Basic Metabolic Panel: Recent Labs  Lab 10/24/23 1700 10/25/23 0531  NA 136 138  K 3.8 3.9  CL 101 108  CO2 25 23  GLUCOSE 157* 140*  BUN 13 16  CREATININE 0.91 0.81  CALCIUM  8.9 8.3*   GFR: Estimated Creatinine Clearance: 36.2 mL/min (by C-G formula based on SCr of 0.81 mg/dL). Liver Function Tests: Recent Labs  Lab 10/24/23 1700 10/25/23 0531  AST 69* 239*  ALT 21 45*  ALKPHOS 118 91  BILITOT 0.6 0.5  PROT 7.2 5.8*  ALBUMIN 3.6 2.7*   Recent Labs  Lab 10/24/23 1700  LIPASE 29   No results for input(s): "AMMONIA" in the last 168 hours. Coagulation Profile: No results for input(s): "INR", "PROTIME" in the last 168 hours. Cardiac Enzymes: Recent Labs  Lab 10/24/23 1700 10/25/23 0531  CKTOTAL 3,705* 10,812*   BNP (last 3 results) No results for input(s): "PROBNP" in the last 8760 hours. HbA1C: No results for input(s): "HGBA1C" in the last 72 hours. CBG: Recent Labs  Lab 10/24/23 1917  GLUCAP 136*   Lipid Profile: No results for input(s): "CHOL", "HDL", "LDLCALC", "TRIG", "CHOLHDL", "LDLDIRECT" in the last 72 hours. Thyroid  Function Tests: No results for input(s): "TSH", "T4TOTAL", "FREET4", "T3FREE", "THYROIDAB" in the last 72 hours. Anemia Panel: No results for input(s): "VITAMINB12", "FOLATE", "FERRITIN", "TIBC", "IRON", "RETICCTPCT" in the last 72 hours. Urine analysis:    Component Value Date/Time   COLORURINE YELLOW 10/24/2023 2023   APPEARANCEUR CLEAR 10/24/2023 2023   LABSPEC 1.016 10/24/2023 2023   PHURINE 6.0 10/24/2023 2023   GLUCOSEU NEGATIVE 10/24/2023 2023   GLUCOSEU NEGATIVE 04/23/2021 1458   HGBUR LARGE (A) 10/24/2023 2023   BILIRUBINUR NEGATIVE 10/24/2023 2023   KETONESUR 20 (A) 10/24/2023 2023   PROTEINUR 100 (A) 10/24/2023 2023   UROBILINOGEN 0.2 04/23/2021 1458   NITRITE NEGATIVE 10/24/2023 2023   LEUKOCYTESUR NEGATIVE 10/24/2023 2023   Sepsis Labs: @LABRCNTIP (procalcitonin:4,lacticidven:4)  ) Recent Results  (from the past 240 hours)  Resp panel by RT-PCR (RSV, Flu A&B, Covid) Anterior Nasal Swab     Status: Abnormal   Collection Time: 10/24/23  7:47 PM   Specimen: Anterior Nasal Swab  Result Value Ref Range Status   SARS Coronavirus 2 by RT PCR POSITIVE (A) NEGATIVE Final    Comment: (NOTE) SARS-CoV-2 target nucleic acids are DETECTED.  The SARS-CoV-2 RNA is generally detectable in upper respiratory specimens during the acute phase of infection. Positive results are indicative of the presence of the identified virus, but do not rule out bacterial infection or co-infection with other pathogens not detected by the test. Clinical correlation with patient history and other diagnostic information is necessary to determine patient infection status. The expected result is Negative.  Fact Sheet for Patients: BloggerCourse.com  Fact Sheet for Healthcare Providers: SeriousBroker.it  This test is not yet approved or cleared by the [place]  FDA and  has been authorized for detection and/or diagnosis of SARS-CoV-2 by FDA under an Emergency Use Authorization (EUA).  This EUA will remain in effect (meaning this test can be used) for the duration  of  the COVID-19 declaration under Section 564(b)(1) of the A ct, 21 U.S.C. section 360bbb-3(b)(1), unless the authorization is terminated or revoked sooner.     Influenza A by PCR NEGATIVE NEGATIVE Final   Influenza B by PCR NEGATIVE NEGATIVE Final    Comment: (NOTE) The Xpert Xpress SARS-CoV-2/FLU/RSV plus assay is intended as an aid in the diagnosis of influenza from Nasopharyngeal swab specimens and should not be used as a sole basis for treatment. Nasal washings and aspirates are unacceptable for Xpert Xpress SARS-CoV-2/FLU/RSV testing.  Fact Sheet for Patients: BloggerCourse.com  Fact Sheet for Healthcare Providers: SeriousBroker.it  This  test is not yet approved or cleared by the [place]  FDA and has been authorized for detection and/or diagnosis of SARS-CoV-2 by FDA under an Emergency Use Authorization (EUA). This EUA will remain in effect (meaning this test can be used) for the duration of the COVID-19 declaration under Section 564(b)(1) of the Act, 21 U.S.C. section 360bbb-3(b)(1), unless the authorization is terminated or revoked.     Resp Syncytial Virus by PCR NEGATIVE NEGATIVE Final    Comment: (NOTE) Fact Sheet for Patients: BloggerCourse.com  Fact Sheet for Healthcare Providers: SeriousBroker.it  This test is not yet approved or cleared by the [place]  FDA and has been authorized for detection and/or diagnosis of SARS-CoV-2 by FDA under an Emergency Use Authorization (EUA). This EUA will remain in effect (meaning this test can be used) for the duration of the COVID-19 declaration under Section 564(b)(1) of the Act, 21 U.S.C. section 360bbb-3(b)(1), unless the authorization is terminated or revoked.  Performed at [facility], [address] [place], [place] [address]       Studies: DG Pelvis 1-2 Views Result Date: 10/24/2023 CLINICAL DATA:  Right hip pain and weakness for 1 day. EXAM: PELVIS - 1-2 VIEW COMPARISON:  08/17/2022 FINDINGS: Mild degenerative changes in the lower lumbar spine and in both hips. No focal bone lesion or bone destruction identified. No acute displaced fractures. SI joints and symphysis pubis are not displaced. Soft tissues are unremarkable. IMPRESSION: Mild degenerative changes in the hips.  No acute bony abnormalities. Electronically Signed   By: Boyce Byes M.D.   On: 10/24/2023 19:27   CT Head Wo Contrast Result Date: 10/24/2023 CLINICAL DATA:  Mental status change EXAM: CT HEAD WITHOUT CONTRAST TECHNIQUE: Contiguous axial images were obtained from the base of the skull through the vertex without  intravenous contrast. RADIATION DOSE REDUCTION: This exam was performed according to the departmental dose-optimization program which includes automated exposure control, adjustment of the mA and/or kV according to patient size and/or use of iterative reconstruction technique. COMPARISON:  CT head 04/18/2023 and MRI head 04/19/2023 FINDINGS: Brain: No intracranial hemorrhage, mass effect, or evidence of acute infarct. No hydrocephalus. No extra-axial fluid collection. Generalized cerebral atrophy and chronic small vessel ischemic disease. Vascular: No hyperdense vessel. Intracranial arterial calcification. Skull: No fracture or focal lesion. Sinuses/Orbits: No acute finding. Other: None. IMPRESSION: No acute intracranial abnormality. Electronically Signed   By: Rozell Cornet M.D.   On: 10/24/2023 18:41   DG Chest Portable 1 View Result Date: 10/24/2023 CLINICAL DATA:  Altered mental status. EXAM: PORTABLE CHEST 1 VIEW COMPARISON:  04/18/2023. FINDINGS: Bilateral lung fields are clear. Bilateral costophrenic angles are clear. Normal cardio-mediastinal silhouette. No acute osseous abnormalities. The soft tissues are within normal limits. IMPRESSION: No active disease. Electronically Signed   By: Beula Brunswick M.D.   On: 10/24/2023 17:30    Scheduled Meds:  enoxaparin  (LOVENOX ) injection  40 mg Subcutaneous Q24H   feeding supplement  237 mL Oral TID BM   nirmatrelvir/ritonavir (renal dosing)  2 tablet Oral BID    Continuous Infusions:   LOS: 0 days     Veronica Gordon, MD [facility]  If 7PM-7AM, please contact night-coverage [email or web address] 10/25/2023, 4:47 PM

## 2023-10-25 NOTE — Evaluation (Signed)
 Occupational Therapy Evaluation Patient Details Name: Brenda Hammond MRN: [id number] DOB: June 12, 1933 Today's Date: 10/25/2023   History of Present Illness   This is an 89 year old female presenting ED 10/24/27 with generalized weakness increased confusion, positive for COVID. JXB:[id number], afib, scoliosis, UTI,fibromyalgia, anxiety, polymyalgia. Patient comes  from [place] assisted living facility/memory care ADLs.     Clinical Impressions The pt is currently presenting with the below listed deficits (see OT problem list). As such, her ADL performance is compromised and she requires assistance for tasks, including dressing, toileting, and bed mobility. Per her medical chart, she resides at an ALF (memory care); at the facility,  she was ambulatory and able to perform basic ADLs without much assistance. She is currently presenting below her baseline level of functioning for self-care management. She has a history of dementia, with associated memory and processing deficits. After being assisted into sitting edge of bed today, she proceeded to return to supine, after a short duration, therefore OT was unable to assess her out of bed abilities. She often stated lyrics to "Pat-A-Cake-Pat-A-Cake" throughout the session. She also referenced "Hank", who she stated was her spouse, who was a Optician, dispensing. She will benefit from further OT services to maximize her safety and independence with self-care tasks & to decrease the risk for restricted participation in meaningful activities. Patient will benefit from continued inpatient follow up therapy, <3 hours/day.      If plan is discharge home, recommend the following:   A lot of help with walking and/or transfers;A lot of help with bathing/dressing/bathroom;Supervision due to cognitive status;Direct supervision/assist for medications management     Functional Status Assessment   Patient has had a recent decline in their functional status and  demonstrates the ability to make significant improvements in function in a reasonable and predictable amount of time.     Equipment Recommendations   Other (comment) (defer to next level of care)     Recommendations for Other Services         Precautions/Restrictions   Precautions Precautions: Fall Restrictions Weight Bearing Restrictions Per Provider Order: No Other Position/Activity Restrictions: airborne precautions     Mobility Bed Mobility Overal bed mobility: Needs Assistance Bed Mobility: Sit to Supine, Supine to Sit     Supine to sit: Max assist, HOB elevated Sit to supine: Min assist   General bed mobility comments:  (Pt sat edge of bed for a short duration before she was noted to proceed to return to supine)    Transfers      General transfer comment: unable to assess      Balance     Sitting balance-Leahy Scale: Fair           ADL either performed or assessed with clinical judgement   ADL Overall ADL's : Needs assistance/impaired Eating/Feeding: Set up;Supervision/ safety;Bed level Eating/Feeding Details (indicate cue type and reason): pt was observed to drink from a cup several times during the session             Upper Body Dressing : Moderate assistance;Bed level   Lower Body Dressing: Moderate assistance;Sitting/lateral leans Lower Body Dressing Details (indicate cue type and reason): She required assist to donn socks seated EOB     Toileting- Clothing Manipulation and Hygiene: Maximal assistance Toileting - Clothing Manipulation Details (indicate cue type and reason): at bedside commode level, based on clinical judgement              Pertinent Vitals/Pain Pain Assessment Pain Assessment: No/denies pain  Extremity/Trunk Assessment Upper Extremity Assessment Upper Extremity Assessment: Overall WFL for tasks assessed   Lower Extremity Assessment Lower Extremity Assessment: Generalized weakness     Communication  Communication Communication: Impaired Factors Affecting Communication: Difficulty expressing self    Cognition Arousal: Alert   Cognition: History of cognitive impairments, Cognition impaired   Orientation impairments: Place, Time, Situation   Memory impairment (select all impairments): Declarative long-term memory, Short-term memory   Executive functioning impairment (select all impairments): Problem solving, Organization                   Following commands: Impaired Following commands impaired: Follows one step commands inconsistently     Cueing  General Comments   Cueing Techniques: Verbal cues;Gestural cues;Tactile cues              Home Living Family/patient expects to be discharged to:: Assisted living        Additional Comments: [place] [facility]      Prior Functioning/Environment Prior Level of Function : Patient poor historian/Family not available  Cognitive Assist : Mobility (cognitive);ADLs (cognitive)           Mobility Comments: chart indicates ambulatory ADLs Comments:  (Pt's medical chart indicates she was ambulatory at the facility and able to perform basic ADLs without assistance.)    OT Problem List: Decreased strength;Decreased activity tolerance;Impaired balance (sitting and/or standing);Decreased cognition;Decreased safety awareness;Decreased knowledge of precautions   OT Treatment/Interventions: Self-care/ADL training;Therapeutic exercise;Therapeutic activities;Energy conservation;Patient/family education;DME and/or AE instruction;Balance training      OT Goals(Current goals can be found in the care plan section)   Acute Rehab OT Goals OT Goal Formulation: Patient unable to participate in goal setting Time For Goal Achievement: 11/08/23 Potential to Achieve Goals: Good   OT Frequency:  Min 2X/week       AM-PAC OT "6 Clicks" Daily Activity     Outcome Measure Help from another person eating meals?: A Little Help  from another person taking care of personal grooming?: A Little Help from another person toileting, which includes using toliet, bedpan, or urinal?: A Lot Help from another person bathing (including washing, rinsing, drying)?: A Lot Help from another person to put on and taking off regular upper body clothing?: A Lot Help from another person to put on and taking off regular lower body clothing?: A Lot 6 Click Score: 14   End of Session Equipment Utilized During Treatment: Other (comment) (N/A) Nurse Communication: Mobility status  Activity Tolerance: Other (comment) (Fair tolerance) Patient left: in bed;with call bell/phone within reach;with bed alarm set  OT Visit Diagnosis: Muscle weakness (generalized) (M62.81);Other symptoms and signs involving cognitive function                Time: 1115-1130 OT Time Calculation (min): 15 min Charges:  OT General Charges $OT Visit: 1 Visit OT Evaluation $OT Eval Moderate Complexity: 1 Mod    Margrett Kalb L Brei Pociask, OTR/L 10/25/2023, 4:24 PM

## 2023-10-25 NOTE — Evaluation (Addendum)
 Physical Therapy Evaluation Patient Details Name: Brenda Hammond MRN: [id number] DOB: Jul 25, 1932 Today's Date: 10/25/2023  History of Present Illness  This is an 89 year old female presenting ED 10/24/27 with generalized weakness increased confusion, positive for covid. WNU:[id number], afib, scoliosis, UTI,fibromyalgia, anxiety, polymyalgia. Patient comes  from [place] assisted living facility/memeory careth ADLs.  Clinical Impression  Pt admitted with above diagnosis.  Pt currently with functional limitations due to the deficits listed below (see PT Problem List). Pt will benefit from acute skilled PT to increase their independence and safety with mobility to allow discharge.     The patient   assisted to sitting and did not participate in attempts to stand, asked to be left alone.  Patient  comes from ALF, was ambulatory by report. Patient will benefit from continued inpatient follow up therapy, <3 hours/day unless progresses to return to facility level of care.        If plan is discharge home, recommend the following: Two people to help with walking and/or transfers;A lot of help with bathing/dressing/bathroom;Direct supervision/assist for medications management;Assist for transportation;Supervision due to cognitive status   Can travel by private vehicle        Equipment Recommendations None recommended by PT  Recommendations for Other Services       Functional Status Assessment Patient has had a recent decline in their functional status and demonstrates the ability to make significant improvements in function in a reasonable and predictable amount of time.     Precautions / Restrictions Precautions Precautions: Fall Restrictions Weight Bearing Restrictions Per Provider Order: No      Mobility  Bed Mobility Overal bed mobility: Needs Assistance Bed Mobility: Rolling, Sidelying to Sit, Sit to Supine, Sit to Sidelying Rolling: Max assist Sidelying to sit: Max  assist     Sit to sidelying: Min assist General bed mobility comments: multimodal cues to  move legs to bed edge and sit upright onto bed edge and scoot to  edge, patient began to return  self to  side, and placed legs onto bed, Assisted again to sit  up and patie t returned self  again, assisted with legs.    Transfers                   General transfer comment: patient declined    Ambulation/Gait                  Stairs            Wheelchair Mobility     Tilt Bed    Modified Rankin (Stroke Patients Only)       Balance Overall balance assessment: Needs assistance, History of Falls Sitting-balance support: Bilateral upper extremity supported, Feet supported Sitting balance-Leahy Scale: Fair                                       Pertinent Vitals/Pain Pain Assessment Pain Assessment: No/denies pain    Home Living Family/patient expects to be discharged to:: Assisted living                   Additional Comments: [place] [facility]    Prior Function Prior Level of Function : Patient poor historian/Family not available;Needs assist  Cognitive Assist : Mobility (cognitive);ADLs (cognitive)           Mobility Comments: chart indicates ambulatory ADLs Comments: unsure     Extremity/Trunk Assessment  Lower Extremity Assessment Lower Extremity Assessment: Generalized weakness    Cervical / Trunk Assessment Cervical / Trunk Assessment: Kyphotic  Communication   Communication Communication: Impaired Factors Affecting Communication: Difficulty expressing self (repeated  It's Howdy doody time" over and over)    Cognition Arousal: Lethargic Behavior During Therapy: Flat affect   PT - Cognitive impairments: History of cognitive impairments, No family/caregiver present to determine baseline                         Following commands: Impaired Following commands impaired: Follows one step  commands inconsistently     Cueing Cueing Techniques: Verbal cues, Gestural cues, Tactile cues     General Comments      Exercises     Assessment/Plan    PT Assessment Patient needs continued PT services  PT Problem List Decreased strength;Decreased mobility;Decreased activity tolerance;Decreased cognition;Decreased safety awareness;Decreased balance       PT Treatment Interventions DME instruction;Therapeutic activities;Cognitive remediation;Gait training;Functional mobility training;Therapeutic exercise;Patient/family education    PT Goals (Current goals can be found in the Care Plan section)  Acute Rehab PT Goals PT Goal Formulation: Patient unable to participate in goal setting Time For Goal Achievement: 11/08/23 Potential to Achieve Goals: Fair    Frequency Min 2X/week     Co-evaluation               AM-PAC PT "6 Clicks" Mobility  Outcome Measure Help needed turning from your back to your side while in a flat bed without using bedrails?: A Lot Help needed moving from lying on your back to sitting on the side of a flat bed without using bedrails?: A Lot Help needed moving to and from a bed to a chair (including a wheelchair)?: A Lot Help needed standing up from a chair using your arms (e.g., wheelchair or bedside chair)?: Total Help needed to walk in hospital room?: Total Help needed climbing 3-5 steps with a railing? : Total 6 Click Score: 9    End of Session Equipment Utilized During Treatment: Gait belt Activity Tolerance: Patient limited by fatigue Patient left: in bed;with call bell/phone within reach;with bed alarm set Nurse Communication: Mobility status PT Visit Diagnosis: Other abnormalities of gait and mobility (R26.89);Repeated falls (R29.6);Muscle weakness (generalized) (M62.81);Difficulty in walking, not elsewhere classified (R26.2)    Time: 3086-5784 PT Time Calculation (min) (ACUTE ONLY): 18 min   Charges:   PT Evaluation $PT Eval Low  Complexity: 1 Low   PT General Charges $$ ACUTE PT VISIT: 1 Visit         Abelina Hoes PT Acute Rehabilitation Services Office [phone] Weekend [phone]    Dareen Ebbing 10/25/2023, 2:02 PM

## 2023-10-25 NOTE — Plan of Care (Signed)

## 2023-10-25 NOTE — TOC Initial Note (Signed)
 Transition of Care [facility]) - Initial/Assessment Note    Patient Details  Name: Brenda Hammond MRN: [id number] Date of Birth: [birth year]  Transition of Care [facility]) CM/SW Contact:    Jonni Nettle, LCSW Phone Number: 10/25/2023, 10:10 AM  Clinical Narrative:                 CSW spoke with pt's daughter, Olive Better, [phone], via phone call to obtain collateral. Pt's daughter confirmed she is pt's HPOA. POA paperwork in chart under media tab. Daughter reports pt is from  [facility], residing in [facility] unit. CSW will follow up with Caren Channel to confirm pt's residency. TOC will continue to follow for needs.  Expected Discharge Plan: Memory Care Barriers to Discharge: Continued Medical Work up  Patient Goals and CMS Choice Patient states their goals for this hospitalization and ongoing recovery are:: To return to [facility]  Expected Discharge Plan and Services In-house Referral: Clinical Social Work Discharge Planning Services: NA Post Acute Care Choice: Resumption of Svcs/PTA Provider Living arrangements for the past 2 months: Assisted Living Facility ([facility])                 DME Arranged: N/A  HH Arranged: NA  Prior Living Arrangements/Services Living arrangements for the past 2 months: Assisted Living Facility ([facility]) Lives with:: Self, Spouse, Facility Resident Patient language and need for interpreter reviewed:: Yes Do you feel safe going back to the place where you live?: Yes      Need for Family Participation in Patient Care: Yes (Comment) Care giver support system in place?: Yes (comment)   Criminal Activity/Legal Involvement Pertinent to Current Situation/Hospitalization: No - Comment as needed  Activities of Daily Living   ADL Screening (condition at time of admission) Independently performs ADLs?: Yes (appropriate for developmental age) Is the patient deaf or have difficulty hearing?: No Does the  patient have difficulty seeing, even when wearing glasses/contacts?: No Does the patient have difficulty concentrating, remembering, or making decisions?: No  Permission Sought/Granted Permission sought to share information with : Facility Medical sales representative, Family Supports Permission granted to share information with : Yes, Verbal Permission Granted  Share Information with NAME: Chief Technology Officer  Permission granted to share info w AGENCY: [facility]  Permission granted to share info w Relationship: Daughter  Permission granted to share info w Contact Information: [phone]  Emotional Assessment Appearance:: Appears stated age Attitude/Demeanor/Rapport: Unable to Assess Affect (typically observed): Unable to Assess Orientation: : Oriented to Self Alcohol / Substance Use: Not Applicable Psych Involvement: No (comment)  Admission diagnosis:  COVID-19 [U07.1] Patient Active Problem List   Diagnosis Date Noted   COVID-19 virus infection 10/24/2023   Rhabdomyolysis 10/24/2023   Acute metabolic encephalopathy 10/24/2023   Essential hypertension 10/24/2023   Generalized weakness 04/19/2023   Syncope 07/13/2022   IFG (impaired fasting glucose) 04/23/2021   Age-related osteoporosis without current pathological fracture 04/23/2021   Syncope and collapse 07/03/2020   Memory disorder 03/01/2019   Medicare annual wellness visit, subsequent 03/21/2017   Routine adult health maintenance 03/21/2017   History of atrial fibrillation 11/26/2015   Edema 11/26/2015   Right leg swelling 02/06/2014   Low back pain 12/06/2012   Osteopenia 12/06/2012   Vitamin D  deficiency 12/06/2012   Malignant neoplasm of colon (HCC) 09/08/2009   ANEMIA 09/08/2009   Venous (peripheral) insufficiency 09/08/2009   GERD 09/08/2009   UTI 09/08/2009   Osteoarthritis 09/08/2009   OTHER DRUG ALLERGY 09/08/2009   Paroxysmal  atrial fibrillation (HCC) 08/21/2009   Hyperlipidemia 10/30/2008    Anxiety state 10/30/2008   Fibromyalgia 10/30/2008   Idiopathic scoliosis and kyphoscoliosis 10/30/2008   POLIOMYELITIS, HX OF 10/30/2008   PCP:  Brendalyn Calkins, MD Pharmacy:   [facility] - [place], [place] - [phone] [address] [place] [address] Phone: [phone] Fax: [phone]  [facility] - [facility] [address] [place] [address] Phone: [phone] Fax: [phone]   Social Drivers of Health (SDOH) Social History: SDOH Screenings   Food Insecurity: No Food Insecurity (10/25/2023)  Housing: Low Risk  (10/25/2023)  Transportation Needs: No Transportation Needs (10/25/2023)  Utilities: Not At Risk (10/25/2023)  Depression (PHQ2-9): Low Risk  (10/13/2021)  Financial Resource Strain: Low Risk  (10/24/2019)  Tobacco Use: Low Risk  (10/24/2023)   SDOH Interventions: N/A    Readmission Risk Interventions     No data to display          Le Primes, LCSW 10/25/2023 10:15 AM

## 2023-10-26 DIAGNOSIS — U071 COVID-19: Secondary | ICD-10-CM | POA: Diagnosis not present

## 2023-10-26 LAB — COMPREHENSIVE METABOLIC PANEL WITH GFR
ALT: 58 U/L — ABNORMAL HIGH (ref 0–44)
AST: 261 U/L — ABNORMAL HIGH (ref 15–41)
Albumin: 2.8 g/dL — ABNORMAL LOW (ref 3.5–5.0)
Alkaline Phosphatase: 89 U/L (ref 38–126)
Anion gap: 6 (ref 5–15)
BUN: 12 mg/dL (ref 8–23)
CO2: 26 mmol/L (ref 22–32)
Calcium: 8.3 mg/dL — ABNORMAL LOW (ref 8.9–10.3)
Chloride: 108 mmol/L (ref 98–111)
Creatinine, Ser: 0.77 mg/dL (ref 0.44–1.00)
GFR, Estimated: >60 mL/min (ref >60)
Glucose, Bld: 94 mg/dL (ref 70–99)
Potassium: 3.5 mmol/L (ref 3.5–5.1)
Sodium: 140 mmol/L (ref 135–145)
Total Bilirubin: 0.5 mg/dL (ref 0.0–1.2)
Total Protein: 6.3 g/dL — ABNORMAL LOW (ref 6.5–8.1)

## 2023-10-26 LAB — CBC WITH DIFFERENTIAL/PLATELET
Abs Immature Granulocytes: 0.03 10*3/uL (ref 0.00–0.07)
Basophils Absolute: 0.1 10*3/uL (ref 0.0–0.1)
Basophils Relative: 1 %
Eosinophils Absolute: 0.3 10*3/uL (ref 0.0–0.5)
Eosinophils Relative: 4 %
HCT: 39.8 % (ref 36.0–46.0)
Hemoglobin: 12.5 g/dL (ref 12.0–15.0)
Immature Granulocytes: 0 %
Lymphocytes Relative: 21 %
Lymphs Abs: 1.7 10*3/uL (ref 0.7–4.0)
MCH: 29 pg (ref 26.0–34.0)
MCHC: 31.4 g/dL (ref 30.0–36.0)
MCV: 92.3 fL (ref 80.0–100.0)
Monocytes Absolute: 1.1 10*3/uL — ABNORMAL HIGH (ref 0.1–1.0)
Monocytes Relative: 14 %
Neutro Abs: 4.8 10*3/uL (ref 1.7–7.7)
Neutrophils Relative %: 60 %
Platelets: 195 10*3/uL (ref 150–400)
RBC: 4.31 MIL/uL (ref 3.87–5.11)
RDW: 13.7 % (ref 11.5–15.5)
WBC: 7.9 10*3/uL (ref 4.0–10.5)
nRBC: 0 % (ref 0.0–0.2)

## 2023-10-26 LAB — CK: Total CK: 9053 U/L — ABNORMAL HIGH (ref 38–234)

## 2023-10-26 LAB — MAGNESIUM: Magnesium: 1.5 mg/dL — ABNORMAL LOW (ref 1.7–2.4)

## 2023-10-26 MED ORDER — POTASSIUM CHLORIDE CRYS ER 20 MEQ PO TBCR
40.0000 meq | EXTENDED_RELEASE_TABLET | Freq: Once | ORAL | Status: AC
  Administered 2023-10-26: 40 meq via ORAL
  Filled 2023-10-26: qty 2

## 2023-10-26 MED ORDER — METOPROLOL TARTRATE 25 MG PO TABS
25.0000 mg | ORAL_TABLET | Freq: Two times a day (BID) | ORAL | Status: DC
  Administered 2023-10-27 – 2023-10-28 (×3): 25 mg via ORAL
  Filled 2023-10-26 (×4): qty 1

## 2023-10-26 MED ORDER — MAGNESIUM SULFATE 2 GM/50ML IV SOLN
2.0000 g | Freq: Once | INTRAVENOUS | Status: AC
  Administered 2023-10-26: 2 g via INTRAVENOUS
  Filled 2023-10-26: qty 50

## 2023-10-26 MED ORDER — APIXABAN 5 MG PO TABS: 5.0000 mg | ORAL_TABLET | Freq: Two times a day (BID) | ORAL | Status: DC

## 2023-10-26 MED ORDER — ENOXAPARIN SODIUM 60 MG/0.6ML IJ SOSY
1.0000 mg/kg | PREFILLED_SYRINGE | Freq: Two times a day (BID) | INTRAMUSCULAR | Status: DC
  Administered 2023-10-26 – 2023-10-28 (×4): 52.5 mg via SUBCUTANEOUS
  Filled 2023-10-26 (×4): qty 0.6

## 2023-10-26 MED ORDER — APIXABAN 2.5 MG PO TABS: 2.5000 mg | ORAL_TABLET | Freq: Two times a day (BID) | ORAL | Status: DC

## 2023-10-26 NOTE — Progress Notes (Signed)
 Patient noted to HR 160's at rest and sustaining, provider made aware of same. Orders placed and commenced. See MAR, interventions ongoing.

## 2023-10-26 NOTE — Progress Notes (Signed)
  Progress Note   Patient: Brenda Hammond UJW:[id number] DOB: Aug 07, 1932 DOA: 10/24/2023     1 DOS: the patient was seen and examined on 10/26/2023 at 1:52PM      Brief hospital course: 89 y.o. F with dementia, lives in memory care, ambulatory and functional, history of colon cancer, HLD and one episode post-op Afib years ago presented with COVID.  Hospitalization complicated by rapid A-fib.     Assessment and Plan: COVID Chest x-ray without infiltrates, no hypoxia.  Given age, Paxlovid started - Continue Paxlovid day 3 of 5 - Flutter and incentive - Robitussin   Rhabdomyolysis CK 3000 on admission, up to 10,000 yesterday, improved over 9000 today.  Transaminase elevation is due to, not liver injury. - Continue IV fluids  Paroxysmal atrial fibrillation with RVR Discussed with daughter, patient had a brief episode of postoperative A-fib years ago.  None since.  She is even had a 30-day event monitor and that picked up no evidence of A-fib.  As a result she has never been on rate or rhythm control agents, or anticoagulation.  I discussed risk benefits of rate control and anticoagulation with the daughter today.  Given patient's baseline good functional status, but her current severe tachycardia with exertion, family have questions about cardioversion. - Start Lovenox  until Paxlovid complete - Consult Cardiology  - If no rhythm control/cardioversion would be offered, could consider holding anticoagulation based on goals of care - Start metoprolol - Supp Mag and K to keep Mag >2, K>4   Hypertension Hyperlipidemia BP elevated Not on medication outpatient. - Continue metoprolol  Acute metabolic cephalopathy Dementia Encephalopathy within expected range for COVID, dementia - Delirium precautions         Subjective: Patient is sitting up in bed, pleasantly confused.  No chest pain, shortness of breath.     Physical Exam: BP 139/72 (BP Location: Right Arm)    Pulse (!) 58   Temp 98.1 F (36.7 C)   Resp 18   Ht 5\' 1"  (1.549 m)   Wt 53.6 kg   SpO2 97%   BMI 22.33 kg/m   Elderly adult female, lying bed, no acute distress Irregularly irregular, rate controlled, no murmurs, no peripheral edema Respiratory rate normal, lungs clear without rales or wheezes Abdomen soft no tenderness palpation or guarding, no ascites or distention Attention somewhat distracted, affect pleasant, offered me some of her melted ice cream, judgment and insight appear impaired but at baseline, generalized weakness but symmetric strength    Data Reviewed: Consulted cardiology team Basic metabolic panel shows hypomagnesemia, hypokalemia, normal renal function CBC normal     Family Communication: Daughter by phone    Disposition: Status is: Inpatient         Author: Ephriam Hashimoto, MD 10/26/2023 5:35 PM  For on call review [email or web address].

## 2023-10-26 NOTE — TOC Progression Note (Signed)
 Transition of Care [facility]) - Progression Note    Patient Details  Name: Brenda Hammond MRN: [id number] Date of Birth: 07-18-1932  Transition of Care [facility]) CM/SW Contact  Jonni Nettle, LCSW Phone Number: 10/26/2023, 2:09 PM  Clinical Narrative:    CSW spoke with pt's daughter, Olive Better, via phone call regarding PT/OT recommendations for short-term rehabilitation. Daughter reports she would prefer pt to return to [facility] memory care and receive [facility] PT/OT services. CSW then spoke with [facility] liaison, Powell Broad, regarding pt's baseline functioning. Per Valinda Gault, pt was able to ambulate with a cane, modified independently prior to hospitalization. Per Augusta Leaver will assess pt's functioning in-person tomorrow to decide if pt can return to [facility] memory care. TOC will continue to follow.   Expected Discharge Plan: Memory Care Barriers to Discharge: Continued Medical Work up  Expected Discharge Plan and Services In-house Referral: Clinical Social Work Discharge Planning Services: NA Post Acute Care Choice: Resumption of Svcs/PTA Provider Living arrangements for the past 2 months: Assisted Living Facility ([facility])                 DME Arranged: N/A  HH Arranged: NA      Social Determinants of Health (SDOH) Interventions SDOH Screenings   Food Insecurity: No Food Insecurity (10/25/2023)  Housing: Low Risk  (10/25/2023)  Transportation Needs: No Transportation Needs (10/25/2023)  Utilities: Not At Risk (10/25/2023)  Depression (PHQ2-9): Low Risk  (10/13/2021)  Financial Resource Strain: Low Risk  (10/24/2019)  Social Connections: Socially Integrated (10/26/2023)  Tobacco Use: Low Risk  (10/24/2023)    Readmission Risk Interventions     No data to display          Le Primes, LCSW 10/26/2023 2:14 PM

## 2023-10-26 NOTE — Hospital Course (Addendum)
 89 y.o. F with dementia, lives in memory care, ambulatory and functional, history of colon cancer, HLD and history post-op Afib who presented with COVID.  Hospitalization complicated by rapid A-fib.

## 2023-10-26 NOTE — Progress Notes (Signed)
 Patient resting with eyes closed, easily aroused, HR 108, no needs at this time. Bed locked and in lowest position , call bell within reach.

## 2023-10-27 DIAGNOSIS — U071 COVID-19: Secondary | ICD-10-CM | POA: Diagnosis not present

## 2023-10-27 DIAGNOSIS — I480 Paroxysmal atrial fibrillation: Secondary | ICD-10-CM

## 2023-10-27 LAB — COMPREHENSIVE METABOLIC PANEL WITH GFR
ALT: 54 U/L — ABNORMAL HIGH (ref 0–44)
AST: 190 U/L — ABNORMAL HIGH (ref 15–41)
Albumin: 2.9 g/dL — ABNORMAL LOW (ref 3.5–5.0)
Alkaline Phosphatase: 84 U/L (ref 38–126)
Anion gap: 10 (ref 5–15)
BUN: 10 mg/dL (ref 8–23)
CO2: 22 mmol/L (ref 22–32)
Calcium: 8.4 mg/dL — ABNORMAL LOW (ref 8.9–10.3)
Chloride: 105 mmol/L (ref 98–111)
Creatinine, Ser: 0.51 mg/dL (ref 0.44–1.00)
GFR, Estimated: >60 mL/min (ref >60)
Glucose, Bld: 107 mg/dL — ABNORMAL HIGH (ref 70–99)
Potassium: 3.9 mmol/L (ref 3.5–5.1)
Sodium: 137 mmol/L (ref 135–145)
Total Bilirubin: 0.6 mg/dL (ref 0.0–1.2)
Total Protein: 6.5 g/dL (ref 6.5–8.1)

## 2023-10-27 LAB — CBC
HCT: 39.9 % (ref 36.0–46.0)
Hemoglobin: 12.3 g/dL (ref 12.0–15.0)
MCH: 29.1 pg (ref 26.0–34.0)
MCHC: 30.8 g/dL (ref 30.0–36.0)
MCV: 94.5 fL (ref 80.0–100.0)
Platelets: 226 10*3/uL (ref 150–400)
RBC: 4.22 MIL/uL (ref 3.87–5.11)
RDW: 13.7 % (ref 11.5–15.5)
WBC: 8.3 10*3/uL (ref 4.0–10.5)
nRBC: 0 % (ref 0.0–0.2)

## 2023-10-27 LAB — CK: Total CK: 4664 U/L — ABNORMAL HIGH (ref 38–234)

## 2023-10-27 LAB — MAGNESIUM: Magnesium: 2.2 mg/dL (ref 1.7–2.4)

## 2023-10-27 NOTE — Assessment & Plan Note (Signed)
 Hyperlipidemia BP elevated Not on medication outpatient. - Continue metoprolol 

## 2023-10-27 NOTE — Assessment & Plan Note (Signed)
 Chest x-ray without infiltrates, no hypoxia.  Given age, Paxlovid  started - Continue Paxlovid  day 4 of 5 - Flutter and incentive - Robitussin

## 2023-10-27 NOTE — Progress Notes (Signed)
  Progress Note   Patient: Laneah Dettorre BJY:[id number] DOB: 1932/12/23 DOA: 10/24/2023     2 DOS: the patient was seen and examined on 10/27/2023        Brief hospital course: 89 y.o. F with dementia, lives in memory care, ambulatory and functional, history of colon cancer, HLD and history post-op Afib who presented with COVID.  Hospitalization complicated by rapid A-fib.     Assessment and Plan: * COVID Chest x-ray without infiltrates, no hypoxia.  Given age, Paxlovid  started - Continue Paxlovid  day 4 of 5 - Flutter and incentive - Robitussin  Rhabdomyolysis CK peaked at 10K.  Down to 4K today - Stop IV fluids  Essential hypertension Hyperlipidemia BP elevated Not on medication outpatient. - Continue metoprolol   Acute metabolic encephalopathy Dementia Encephalopathy within expected range for COVID, dementia - Delirium precautions  Paroxysmal atrial fibrillation (HCC) See summary from yesterday, into sinus today - Continue Lovenox  for now - Continue discussions with family regarding long-term anticoagulation - Continue metoprolol  - Keep potassium greater than 4, magnesium  greater than 2 -Cardiology consult, appreciate recommendations         Subjective: Patient pleasantly confused.  No change in mentation or functional status.  Back in sinus today.  Cardiology were consulted today.     Physical Exam: BP (!) 177/77 (BP Location: Left Arm)   Pulse 64   Temp 98.7 F (37.1 C) (Oral)   Resp 19   Ht 5\' 1"  (1.549 m)   Wt 53.6 kg   SpO2 94%   BMI 22.33 kg/m   Elderly adult female, lying in bed, appears weak and tired, confused RRR, no murmurs, no peripheral edema Respiratory rate normal, lungs diminished, poor inspiratory effort, difficulty cooperating with exam due to dementia Abdomen soft, no grimace to palpation, no guarding Mostly inattentive to conversation, questions, disoriented rambling incoherently, generalized weakness, symmetric  strength, face symmetric    Data Reviewed: Discussed with cardiology team CK down to 4664 Creatinine stable, potassium up to 3.9, magnesium  2.2 CBC normal  Family Communication: Daughter by phone    Disposition: Status is: Inpatient         Author: Ephriam Hashimoto, MD 10/27/2023 2:45 PM  For on call review [email or web address].

## 2023-10-27 NOTE — Progress Notes (Signed)
 Physical Therapy Treatment Patient Details Name: Brenda Hammond MRN: [id number] DOB: 01-13-1933 Today's Date: 10/27/2023   History of Present Illness This is an 89 year old female presenting ED 10/24/27 with generalized weakness increased confusion, positive for covid. MWU:[id number], afib, scoliosis, UTI,fibromyalgia, anxiety, polymyalgia. Patient comes  from [place] assisted living facility/[facility] care[facility] ADLs.    PT Comments  Pt dtr present in room, follow up to morning attempt. Pt is agreeable to session and pleasant. Able to come to sit EOB with CGA to assist with scooting towards EOB. She is able to maintain this level of assistance through transfers and mobility in room, navigates smaller spaces well. O2 sats remain 100% and HR 67bpm. Pt appears to be making progress towards baseline and would benefit from continued skilled physical therapy in her familiar environment to restore functional activities when medically ready for dc.     If plan is discharge home, recommend the following: Direct supervision/assist for medications management;Assist for transportation;Supervision due to cognitive status;A little help with walking and/or transfers;A little help with bathing/dressing/bathroom   Can travel by private vehicle     Yes  Equipment Recommendations  None recommended by PT    Recommendations for Other Services       Precautions / Restrictions Precautions Precautions: Fall Restrictions Weight Bearing Restrictions Per Provider Order: No Other Position/Activity Restrictions: airborne precautions     Mobility  Bed Mobility Overal bed mobility: Needs Assistance Bed Mobility: Supine to Sit     Supine to sit: Min assist     General bed mobility comments: Pt requires min A to scoot to EOB    Transfers Overall transfer level: Needs assistance Equipment used: Rolling walker (2 wheels) Transfers: Sit to/from Stand Sit to Stand: Min assist           General  transfer comment: min A with 2WW    Ambulation/Gait Ambulation/Gait assistance: Contact guard assist Gait Distance (Feet): 30 Feet Assistive device: Rolling walker (2 wheels) Gait Pattern/deviations: Step-through pattern, Decreased stride length, Wide base of support Gait velocity: dec     General Gait Details: Pt able to navigate smaller spaces in room completing 39ft at CGA, small steps bilaterally and symmetrically   Stairs             Wheelchair Mobility     Tilt Bed    Modified Rankin (Stroke Patients Only)       Balance Overall balance assessment: Needs assistance, History of Falls Sitting-balance support: Feet supported, No upper extremity supported Sitting balance-Leahy Scale: Fair     Standing balance support: During functional activity, No upper extremity supported Standing balance-Leahy Scale: Fair Standing balance comment: able to look over shoulder without LOB                            Communication Communication Communication: Impaired Factors Affecting Communication: Difficulty expressing self  Cognition Arousal: Alert Behavior During Therapy: WFL for tasks assessed/performed   PT - Cognitive impairments: History of cognitive impairments                         Following commands: Impaired Following commands impaired: Follows one step commands inconsistently    Cueing Cueing Techniques: Verbal cues, Gestural cues, Tactile cues  Exercises      General Comments        Pertinent Vitals/Pain Pain Assessment Pain Assessment: Faces Faces Pain Scale: Hurts even more (R shoulder) Pain Location: R shoulder  Pain Descriptors / Indicators: Grimacing, Sore, Jabbing Pain Intervention(s): Monitored during session    Home Living                          Prior Function            PT Goals (current goals can now be found in the care plan section) Acute Rehab PT Goals PT Goal Formulation: Patient unable to  participate in goal setting Time For Goal Achievement: 11/08/23 Potential to Achieve Goals: Fair Progress towards PT goals: Progressing toward goals    Frequency    Min 2X/week      PT Plan      Co-evaluation              AM-PAC PT "6 Clicks" Mobility   Outcome Measure  Help needed turning from your back to your side while in a flat bed without using bedrails?: A Little Help needed moving from lying on your back to sitting on the side of a flat bed without using bedrails?: A Little Help needed moving to and from a bed to a chair (including a wheelchair)?: A Little Help needed standing up from a chair using your arms (e.g., wheelchair or bedside chair)?: A Little Help needed to walk in hospital room?: A Little Help needed climbing 3-5 steps with a railing? : A Lot 6 Click Score: 17    End of Session Equipment Utilized During Treatment: Gait belt Activity Tolerance: Patient tolerated treatment well Patient left: in chair;with call bell/phone within reach;with chair alarm set;with family/visitor present Nurse Communication: Mobility status PT Visit Diagnosis: Other abnormalities of gait and mobility (R26.89);Repeated falls (R29.6);Muscle weakness (generalized) (M62.81);Difficulty in walking, not elsewhere classified (R26.2)     Time: 1659-1730 PT Time Calculation (min) (ACUTE ONLY): 31 min  Charges:    $Gait Training: 8-22 mins $Therapeutic Activity: 8-22 mins PT General Charges $$ ACUTE PT VISIT: 1 Visit                     Brenda Hammond, PT Acute Rehabilitation Services Office: [phone] 10/27/2023    Brenda Hammond 10/27/2023, 3:46 PM

## 2023-10-27 NOTE — TOC Progression Note (Signed)
 Transition of Care [facility]) - Progression Note    Patient Details  Name: Brenda Hammond MRN: [id number] Date of Birth: 12-30-1932  [facility]) CM/SW Contact  Jonni Nettle, LCSW Phone Number: 10/27/2023, 11:03 AM  Clinical Narrative:    CSW spoke with Powell Broad, admissions staff, at [facility]. Due to pt's refusal to work with PT, insurance may likely decline insurance authorization for SNF placement. Per Valinda Gault, due to pt's decline in functioning status and refusal to work with PT, pt is unable to return to [place], unless transitioned to hospice services. MD notified. TOC will continue to follow.   Expected Discharge Plan: Memory Care Barriers to Discharge: Continued Medical Work up  Expected Discharge Plan and Services In-house Referral: Clinical Social Work Discharge Planning Services: NA Post Acute Care Choice: Resumption of Svcs/PTA Provider Living arrangements for the past 2 months: Assisted Living Facility ([facility])                 DME Arranged: N/A  HH Arranged: NA   Social Determinants of Health (SDOH) Interventions SDOH Screenings   Food Insecurity: No Food Insecurity (10/25/2023)  Housing: Low Risk  (10/25/2023)  Transportation Needs: No Transportation Needs (10/25/2023)  Utilities: Not At Risk (10/25/2023)  Depression (PHQ2-9): Low Risk  (10/13/2021)  Financial Resource Strain: Low Risk  (10/24/2019)  Social Connections: Socially Integrated (10/26/2023)  Tobacco Use: Low Risk  (10/24/2023)    Readmission Risk Interventions     No data to display         Le Primes, LCSW 10/27/2023 11:09 AM

## 2023-10-27 NOTE — Assessment & Plan Note (Signed)
 CK peaked at 10K.  Down to 4K today - Stop IV fluids

## 2023-10-27 NOTE — Progress Notes (Signed)
   10/27/23 0951  PT Visit Information  Last PT Received On 10/27/23  Assistance Needed +2  Reason Eval/Treat Not Completed Patient declined, no reason specified  History of Present Illness This is an 89 year old female presenting ED 10/24/27 with generalized weakness increased confusion, positive for covid. DGL:[id number], afib, scoliosis, UTI,fibromyalgia, anxiety, polymyalgia. Patient comes  from [place] assisted living facility[facility] ADLs.   Pt states that she is feeling lazy, despite significant encouragement and education, pt declines to participate at this time. Will follow up.  Darien Eden, PT Acute Rehabilitation Services Office: [phone] 10/27/2023

## 2023-10-27 NOTE — Consult Note (Signed)
 Cardiology Consultation   Patient ID: Brenda Hammond MRN: [id number]; DOB: 1933-04-12  Admit date: 10/24/2023 Date of Consult: 10/27/2023  PCP:  Brendalyn Calkins, MD   [facility] Providers Cardiologist:  Ola Berger, MD    Patient Profile:   Brenda Hammond is a 89 y.o. female with a hx of a brief episode of atrial fibrillation in 2011 treated with short course of amio, HLD, history of near syncope/syncope, dementia, anemia, anxiety, history of colon cancer, GERD who is being seen 10/27/2023 for the evaluation of atrial fibrillation at the request of Dr. Darlyn Eke.  History of Present Illness:   Brenda Hammond is an 89 year old female with above medical history who was been followed by Dr. Avanell Bob in the past.  Per chart review, patient had colon cancer resection in 2011.  After the surgery, she had a brief episode of atrial fibrillation.  She was treated with a short course of amiodarone.  Echocardiogram at that time showed normal EF.  She was not followed by cardiology following this event due to lack of recurrence of A-fib  Patient was admitted in 06/2020 after she had episodes of syncope/near syncope.  She underwent echocardiogram on 07/04/2020 that showed EF 55-60%, no regional wall motion abnormalities, grade 1 DD, normal RV systolic function, normal pulmonary artery systolic pressure, no significant valvular abnormalities.  Patient also wore a cardiac monitor in 06/2020 that showed predominantly normal sinus rhythm with average heart rate 70 bpm, no significant pauses, no atrial fibrillation, 1 6 beat run of NSVT.  She was seen by Dr. Avanell Bob in clinic on 08/05/2020 and was feeling well at that time, told she could follow-up with cardiology as needed.  Patient was again admitted in 03/2023 following an episode of unresponsiveness.  Echocardiogram 04/19/2023 showed EF 60-65%, no regional wall motion abnormalities, grade 1 DD, normal RV systolic function, no significant valvular  abnormalities.  CT head did not show any acute findings, MRI did not show any acute findings.  He remained in normal sinus rhythm during that admission.  Patient was brought in by EMS on 4/28 for evaluation of weakness that have been going on for 1 day.  Per report, patient was found in her BSC unable to get up.  Labs in the ED showed creatinine 0.91, K3.8, WBC 11.9, hemoglobin 13.3, platelets 239.  Total CK elevated to 3705.  High-sensitivity troponin 10. Patient was positive for COVID. Initial EKG in the ED showed normal sinus rhythm with heart rate 79 bpm, borderline short PR interval. Chest x-ray showed no active disease.  X-ray pelvis showed mild degenerative changes in the hips, no acute bony abnormalities.  CT head showed no acute intracranial abnormalities.  Patient was admitted to the internal medicine service for treatment of COVID, started on 5-day course of Paxlovid .  Was also started on IV fluids for acute rhabdomyolysis.  On 4/29, it was noted that patient was tachycardic with HR to the 160s. She was given IV fluids and IV metoprolol . She was started on lovenox  until paxlovid  complete. Cardiology was consulted.   On interview, patient pleasantly confused. She does not answer questions appropriately. For example, when asked her name she told me it was "Miss Muffet".   Past Medical History:  Diagnosis Date   Anemia    Anxiety    Atrial fibrillation (HCC)    Colon adenocarcinoma (HCC)    DJD (degenerative joint disease)    Fibromyalgia    GERD (gastroesophageal reflux disease)    History of  poliomyelitis    History of UTI    Hyperlipemia    Memory disorder 03/01/2019   Other drug allergy(995.27)    Scoliosis    Surgical or other procedure not carried out because of patient's decision    Venous insufficiency     Past Surgical History:  Procedure Laterality Date   APPENDECTOMY     LOW ANTERIOR BOWEL RESECTION  06/2009   with anastomosis for colon cancer   OTHER SURGICAL  HISTORY     T & A   TOTAL ABDOMINAL HYSTERECTOMY W/ BILATERAL SALPINGOOPHORECTOMY  1980s     Inpatient Medications: Scheduled Meds:  [START ON 10/29/2023] apixaban   2.5 mg Oral BID   enoxaparin  (LOVENOX ) injection  1 mg/kg Subcutaneous Q12H   feeding supplement  237 mL Oral TID BM   metoprolol  tartrate  25 mg Oral BID   nirmatrelvir /ritonavir  (renal dosing)  2 tablet Oral BID   Continuous Infusions:  PRN Meds: acetaminophen  **OR** acetaminophen , guaiFENesin -dextromethorphan, haloperidol  lactate, hydrALAZINE   Allergies:    Allergies  Allergen Reactions   Sulfa Antibiotics Anaphylaxis, Hypertension and Other (See Comments)    Elevated HR and BP, also and "ALLERGIC," per [facility]  REACTION: elevated HR and BP   Alendronate  Other (See Comments)    "twitching" and "ALLERGIC," per [facility]   Aspirin Other (See Comments)    Made the ears ring and "ALLERGIC," per [facility]   Cyclobenzaprine Hcl Other (See Comments)    Nightmares- "ALLERGIC," per [facility]   Metaxalone Other (See Comments)    Nightmares- "ALLERGIC," per [facility]   Nitrofurantoin Other (See Comments) and Hypertension    Elevated B/P and chest pain- "ALLERGIC," per [facility]   Aricept  [Donepezil ] Rash and Other (See Comments)    Dizziness and cramping also- "ALLERGIC," per [facility]   Nortriptyline Hcl Rash and Other (See Comments)    "ALLERGIC," per [facility]   Nsaids Rash and Other (See Comments)    "ALLERGIC," per [facility]   Penicillins Rash and Other (See Comments)    High fever, too- "ALLERGIC," per [facility]    Social History:   Social History   Socioeconomic History   Marital status: Married    Spouse name: Jailah Cupit   Number of children: 1   Years of education: 15   Highest education level: Not on file  Occupational History   Occupation: Brewing technologist    Comment: Works part time    Employer: [employer]  Tobacco Use   Smoking status: Never   Smokeless tobacco: Never  Substance and Sexual Activity   Alcohol use: No   Drug use: No    Sexual activity: Not on file  Other Topics Concern   Not on file  Social History Narrative   Originally from [place] and has lived with her husband in [place] since 1968.   Fun: Exercises regularly, [place] and have breakfast, walking.   Denies abuse and feels safe at home.       Right handed    Caffeine ~ 3 cups per day    Living with his daughter    Social Drivers of Health   Financial Resource Strain: Low Risk  (10/24/2019)   Overall Financial Resource Strain (CARDIA)    Difficulty of Paying Living Expenses: Not hard at all  Food Insecurity: No Food Insecurity (10/25/2023)   Hunger Vital Sign    Worried About Running Out of Food in the Last Year: Never true    Ran Out of Food in the Last Year: Never true  Transportation Needs: No Transportation Needs (10/25/2023)   PRAPARE - Administrator, Civil Service (Medical): No    Lack of Transportation (Non-Medical): No  Physical Activity: Not on file  Stress: Not on file  Social Connections: Socially Integrated (10/26/2023)   Social Connection and Isolation Panel [NHANES]    Frequency of Communication with Friends and Family: More than three times a week    Frequency of Social Gatherings with Friends and Family: Three times a week    Attends Religious Services: More than 4 times per year    Active Member of Clubs or Organizations: Yes    Attends Banker Meetings: More than 4 times per year    Marital Status: Married  Catering manager Violence: Not At Risk (10/25/2023)   Humiliation, Afraid, Rape, and Kick questionnaire    Fear of Current or Ex-Partner: No    Emotionally Abused: No    Physically Abused: No    Sexually Abused: No    Family History:    Family History  Problem Relation Age of Onset   Stroke Father    Cancer Mother        brain   Colon cancer Neg Hx      ROS:  Please see the history of present illness.   All other ROS reviewed and negative.     Physical Exam/Data:   Vitals:    10/26/23 1428 10/26/23 1935 10/27/23 0507 10/27/23 0521  BP: 139/72 (!) 142/94 (!) 184/87 (!) 154/93  Pulse: (!) 58 99 79 83  Resp: 18 16 19    Temp: 98.1 F (36.7 C) 98.4 F (36.9 C) 98.1 F (36.7 C)   TempSrc:  Axillary Oral   SpO2: 97% 95% 100%   Weight:      Height:        Intake/Output Summary (Last 24 hours) at 10/27/2023 1004 Last data filed at 10/26/2023 2100 Gross per 24 hour  Intake 237 ml  Output --  Net 237 ml      10/24/2023   11:00 PM 10/27/2022    9:06 AM 07/13/2022    8:55 AM  Last 3 Weights  Weight (lbs) 118 lb 2.7 oz 118 lb 2.7 oz 118 lb 2.7 oz  Weight (kg) 53.6 kg 53.6 kg 53.6 kg     Body mass index is 22.33 kg/m.  General:  Elderly female. Sitting upright in the bed. No acute distress  HEENT: normal Neck: no JVD Vascular: Radial pulses 2+ bilaterally Cardiac:  normal S1, S2; RRR; no murmur   Lungs:  Breathing unlabored  Abd: soft, nontender Ext: no edema in BLE  Musculoskeletal:  No deformities Skin: warm and dry  Psych:  Confused, not oriented to person place or time   EKG:  The EKG was personally reviewed and demonstrates:  EKG from 4/28 showed NSR  Telemetry:  Telemetry was personally reviewed and demonstrates:  Patient was in atrial fibrillation from 4/29 around 1900 to 4/30 around 2000. Has since been maintaining NSR   Relevant CV Studies:  Laboratory Data:  High Sensitivity Troponin:   Recent Labs  Lab 10/24/23 1700 10/24/23 1912  TROPONINIHS 10 14     Chemistry Recent Labs  Lab 10/25/23 0531 10/26/23 0547 10/26/23 0858 10/27/23 0603  NA 138 140  --  137  K 3.9 3.5  --  3.9  CL 108 108  --  105  CO2 23 26  --  22  GLUCOSE 140* 94  --  107*  BUN 16  12  --  10  CREATININE 0.81 0.77  --  0.51  CALCIUM  8.3* 8.3*  --  8.4*  MG  --   --  1.5* 2.2  GFRNONAA >60 >60  --  >60  ANIONGAP 7 6  --  10    Recent Labs  Lab 10/25/23 0531 10/26/23 0547 10/27/23 0603  PROT 5.8* 6.3* 6.5  ALBUMIN 2.7* 2.8* 2.9*  AST 239* 261* 190*   ALT 45* 58* 54*  ALKPHOS 91 89 84  BILITOT 0.5 0.5 0.6   Lipids No results for input(s): "CHOL", "TRIG", "HDL", "LABVLDL", "LDLCALC", "CHOLHDL" in the last 168 hours.  Hematology Recent Labs  Lab 10/25/23 0531 10/26/23 0547 10/27/23 0808  WBC 9.8 7.9 8.3  RBC 3.96 4.31 4.22  HGB 11.6* 12.5 12.3  HCT 36.2 39.8 39.9  MCV 91.4 92.3 94.5  MCH 29.3 29.0 29.1  MCHC 32.0 31.4 30.8  RDW 13.5 13.7 13.7  PLT 198 195 226   Thyroid  No results for input(s): "TSH", "FREET4" in the last 168 hours.  BNPNo results for input(s): "BNP", "PROBNP" in the last 168 hours.  DDimer No results for input(s): "DDIMER" in the last 168 hours.   Radiology/Studies:  DG Pelvis 1-2 Views Result Date: 10/24/2023 CLINICAL DATA:  Right hip pain and weakness for 1 day. EXAM: PELVIS - 1-2 VIEW COMPARISON:  08/17/2022 FINDINGS: Mild degenerative changes in the lower lumbar spine and in both hips. No focal bone lesion or bone destruction identified. No acute displaced fractures. SI joints and symphysis pubis are not displaced. Soft tissues are unremarkable. IMPRESSION: Mild degenerative changes in the hips.  No acute bony abnormalities. Electronically Signed   By: Boyce Byes M.D.   On: 10/24/2023 19:27   CT Head Wo Contrast Result Date: 10/24/2023 CLINICAL DATA:  Mental status change EXAM: CT HEAD WITHOUT CONTRAST TECHNIQUE: Contiguous axial images were obtained from the base of the skull through the vertex without intravenous contrast. RADIATION DOSE REDUCTION: This exam was performed according to the departmental dose-optimization program which includes automated exposure control, adjustment of the mA and/or kV according to patient size and/or use of iterative reconstruction technique. COMPARISON:  CT head 04/18/2023 and MRI head 04/19/2023 FINDINGS: Brain: No intracranial hemorrhage, mass effect, or evidence of acute infarct. No hydrocephalus. No extra-axial fluid collection. Generalized cerebral atrophy and  chronic small vessel ischemic disease. Vascular: No hyperdense vessel. Intracranial arterial calcification. Skull: No fracture or focal lesion. Sinuses/Orbits: No acute finding. Other: None. IMPRESSION: No acute intracranial abnormality. Electronically Signed   By: Rozell Cornet M.D.   On: 10/24/2023 18:41   DG Chest Portable 1 View Result Date: 10/24/2023 CLINICAL DATA:  Altered mental status. EXAM: PORTABLE CHEST 1 VIEW COMPARISON:  04/18/2023. FINDINGS: Bilateral lung fields are clear. Bilateral costophrenic angles are clear. Normal cardio-mediastinal silhouette. No acute osseous abnormalities. The soft tissues are within normal limits. IMPRESSION: No active disease. Electronically Signed   By: Beula Brunswick M.D.   On: 10/24/2023 17:30     Assessment and Plan:   Paroxysmal Atrial Fibrillation  - Patient previously had an episode of atrial fibrillation in 2011 after surgery, resolved with amiodarone and had not recurred  - Patient currently admitted with COVID. Had around 24 hours of atrial fibrillation from evening of 4/29 through 4/30  - Now maintaining NSR - Suspect afib triggered by COVID infection - Continue to monitor on telemetry  - Continue metoprolol  tartrate 25 mg Bid  - Most recent echo from 03/2023 showed EF 60-65%,  no regional wall motion abnormalities, grade I DD, normal RV systolic function, no significant valvular abnormalities. No indication to repeat echocardiogram at this time  - OK to continue lovenox  for now. With patient's advanced age and dementia, we need to discuss long term anticoagulation with patient's family. She has had some episodes of syncope/near syncope and falls in the past. I suspect the risks of anticoagulation outweigh the benefits. CHADS-VASc 10 (age x2, gender, vascular disease)   Otherwise per primary  - COVID  - Rhabdomyolysis  - HLD  - Acute metabolic encephalopathy, dementia   Risk Assessment/Risk Scores:   CHA2DS2-VASc Score = 4  This  indicates a 4.8% annual risk of stroke. The patient's score is based upon: CHF History: 0 HTN History: 0 Diabetes History: 0 Stroke History: 0 Vascular Disease History: 1 Age Score: 2 Gender Score: 1    For questions or updates, please contact [facility] Please consult [email or web address] for contact info under    Signed, Debria Fang, PA-C  10/27/2023 10:04 AM

## 2023-10-27 NOTE — Assessment & Plan Note (Signed)
 Discussed with daughter, patient had a brief episode of postoperative A-fib years ago.  None since.  She is even had a 30-day event monitor and that picked up no evidence of A-fib.  As a result she has never been on rate or rhythm control agents, or anticoagulation.   I discussed risk benefits of rate control and anticoagulation with the daughter today.  Given patient's baseline good functional status, but her current severe tachycardia with exertion, family have questions about cardioversion. - Start Lovenox  until Paxlovid  complete - Consult Cardiology  - If no rhythm control/cardioversion would be offered, could consider holding anticoagulation based on goals of care - Start metoprolol  - Supp Mag and K to keep Mag >2, K>4

## 2023-10-27 NOTE — Plan of Care (Signed)
  Problem: Clinical Measurements: Goal: Respiratory complications will improve Outcome: Progressing   Problem: Nutrition: Goal: Adequate nutrition will be maintained Outcome: Progressing   Problem: Safety: Goal: Ability to remain free from injury will improve Outcome: Progressing   

## 2023-10-27 NOTE — Assessment & Plan Note (Signed)
 Dementia Encephalopathy within expected range for COVID, dementia - Delirium precautions

## 2023-10-28 DIAGNOSIS — U071 COVID-19: Secondary | ICD-10-CM | POA: Diagnosis not present

## 2023-10-28 LAB — BASIC METABOLIC PANEL WITH GFR
Anion gap: 8 (ref 5–15)
BUN: 12 mg/dL (ref 8–23)
CO2: 24 mmol/L (ref 22–32)
Calcium: 8.5 mg/dL — ABNORMAL LOW (ref 8.9–10.3)
Chloride: 106 mmol/L (ref 98–111)
Creatinine, Ser: 0.67 mg/dL (ref 0.44–1.00)
GFR, Estimated: >60 mL/min (ref >60)
Glucose, Bld: 90 mg/dL (ref 70–99)
Potassium: 3.8 mmol/L (ref 3.5–5.1)
Sodium: 138 mmol/L (ref 135–145)

## 2023-10-28 LAB — CBC
HCT: 36.9 % (ref 36.0–46.0)
Hemoglobin: 11.9 g/dL — ABNORMAL LOW (ref 12.0–15.0)
MCH: 29.8 pg (ref 26.0–34.0)
MCHC: 32.2 g/dL (ref 30.0–36.0)
MCV: 92.3 fL (ref 80.0–100.0)
Platelets: 219 10*3/uL (ref 150–400)
RBC: 4 MIL/uL (ref 3.87–5.11)
RDW: 13.6 % (ref 11.5–15.5)
WBC: 8.4 10*3/uL (ref 4.0–10.5)
nRBC: 0 % (ref 0.0–0.2)

## 2023-10-28 LAB — MAGNESIUM: Magnesium: 2 mg/dL (ref 1.7–2.4)

## 2023-10-28 LAB — CK: Total CK: 2571 U/L — ABNORMAL HIGH (ref 38–234)

## 2023-10-28 MED ORDER — GUAIFENESIN-DM 100-10 MG/5ML PO SYRP: 5.0000 mL | ORAL_SOLUTION | ORAL | 0 refills | Status: DC | PRN

## 2023-10-28 MED ORDER — METOPROLOL SUCCINATE ER 50 MG PO TB24: 50.0000 mg | ORAL_TABLET | Freq: Every day | ORAL | 11 refills | Status: AC

## 2023-10-28 MED ORDER — NIRMATRELVIR/RITONAVIR (PAXLOVID) TABLET (RENAL DOSING): 2.0000 | ORAL_TABLET | Freq: Two times a day (BID) | ORAL | 0 refills | Status: AC

## 2023-10-28 NOTE — Discharge Summary (Signed)
 Physician Discharge Summary   Patient: Brenda Hammond MRN: [id number] DOB: 26-Aug-1932  Admit date:     10/24/2023  Discharge date: 10/28/23  Discharge Physician: Ephriam Hashimoto   PCP: Brendalyn Calkins, MD     Recommendations at discharge:  Follow up with PCP for COVID Titrate new metoprolol  as needed Discuss testing for paroxysmal atrial fibrillation with Cardiology as per goals of care     Discharge Diagnoses: Principal Problem:   COVID Active Problems:   Rhabdomyolysis   Paroxysmal atrial fibrillation (HCC)   Acute metabolic encephalopathy   Essential hypertension      Hospital Course: 89 y.o. F with dementia, lives in memory care, ambulatory and functional, history of colon cancer, HLD and history post-op Afib who presented with COVID.  Hospitalization complicated by rapid A-fib.       * COVID Patient admitted and started on Paxlovid .  Chest x-ray without infiltrates, no hypoxia.  Her oral intake was good, mentation resolved to baseline, she had no respiratory symptoms.  Discharged to complete last 2 doses of Paxlovid  tonight and tomorrow morning.    Rhabdomyolysis Mild, due to COVID, peaked at 10K.  CK steadily downtrending in the last 48 hours with oral hydration alone.  No kidney injury.  Essential hypertension Hyperlipidemia Blood pressure elevated slightly here, not on medication for hyperlipidemia or hypertension as an outpatient.  At her age and with her dementia, do not feel statin is warranted.  However metoprolol  for A-fib suppression was started and that should be continued.  Acute metabolic encephalopathy Dementia Encephalopathy within expected range for COVID, dementia  Paroxysmal atrial fibrillation [facility]) Discussed with daughter, patient had a brief episode of postoperative A-fib years ago.  None since.  She had a 30-day event monitor in the past and that picked up no evidence of A-fib.  As a result she has never been on rate or  rhythm control agents, or anticoagulation.   She had a brief episode here lasting 36 hours, which resolved with metoprolol .  Given both episodes were provoked and short lasting, unclear that anticoagulation is warranted at this time.  Cardiology were consulted, and felt the same.  Would recommend following the patient week over week a month of a month to monitor her overall trajectory.  If desired, based on goals of care, family could return to cardiology, Dr. Avanell Bob, to discuss cardiac monitoring or anticoagulation.            The [place]  Controlled Substances Registry was reviewed for this patient prior to discharge.  Consultants: Cardiology, Dr. Micael Adas   Disposition: Long term care facility Diet recommendation:  Discharge Diet Orders (From admission, onward)     Start     Ordered   10/28/23 0000  Diet - low sodium heart healthy        10/28/23 1329             DISCHARGE MEDICATION: Allergies as of 10/28/2023       Reactions   Sulfa Antibiotics Anaphylaxis, Hypertension, Other (See Comments)   Elevated HR and BP, also and "ALLERGIC," per [facility] REACTION: elevated HR and BP   Alendronate  Other (See Comments)   "twitching" and "ALLERGIC," per [facility]   Aspirin Other (See Comments)   Made the ears ring and "ALLERGIC," per [facility]   Cyclobenzaprine Hcl Other (See Comments)   Nightmares- "ALLERGIC," per [facility]   Metaxalone Other (See Comments)   Nightmares- "ALLERGIC," per [facility]   Nitrofurantoin Other (See Comments), Hypertension   Elevated B/P  and chest pain- "ALLERGIC," per [facility]   Aricept  [donepezil ] Rash, Other (See Comments)   Dizziness and cramping also- "ALLERGIC," per [facility]   Nortriptyline Hcl Rash, Other (See Comments)   "ALLERGIC," per [facility]   Nsaids Rash, Other (See Comments)   "ALLERGIC," per [facility]   Penicillins Rash, Other (See Comments)   High fever, too- "ALLERGIC," per [facility]        Medication List     TAKE these medications    bisacodyl 5 MG EC  tablet Commonly known as: DULCOLAX Take 5 mg by mouth daily as needed for moderate constipation.   cyanocobalamin  1000 MCG tablet Take 1 tablet (1,000 mcg total) by mouth daily.   guaiFENesin -dextromethorphan 100-10 MG/5ML syrup Commonly known as: ROBITUSSIN DM Take 5 mLs by mouth every 4 (four) hours as needed for cough.   melatonin 3 MG Tabs tablet Take 1 tablet (3 mg total) by mouth at bedtime as needed (insomnia).   metoprolol  succinate 50 MG 24 hr tablet Commonly known as: Toprol  XL Take 1 tablet (50 mg total) by mouth daily.   nirmatrelvir /ritonavir  (renal dosing) 10 x 150 MG & 10 x 100MG  Tabs Commonly known as: PAXLOVID  Take 2 tablets by mouth 2 (two) times daily for 1 day. Patient GFR is 36. Take nirmatrelvir  (150 mg) one tablet twice daily for 5 days and ritonavir  (100 mg) one tablet twice daily for 2 more doses.   PSYLLIUM PO Take 0.4 g by mouth at bedtime.   Tylenol  8 Hour Arthritis Pain 650 MG CR tablet Generic drug: acetaminophen  Take 650 mg by mouth every 12 (twelve) hours as needed for pain.   Vitamin D3 50 MCG (2000 UT) Tabs Take by mouth.         Discharge Instructions     Diet - low sodium heart healthy   Complete by: As directed    Discharge instructions   Complete by: As directed    You were diagnosed with COVID You were treated here with Paxlovid  You have two more doses (one dose Friday night and one dose Saturday mornig) Remain in isolation until 10 days from your first symptoms at [facility] (or per [employer])  You had another episode of atrial fibrillation Take the new medicine Toprol  XL 50 mg daily Follow up with Dr. Avanell Bob as needed   Increase activity slowly   Complete by: As directed        Discharge Exam: Filed Weights   10/24/23 2300  Weight: 53.6 kg    General: Pt is alert, awake, not in acute distress, sitting up with daughter, eating Cardiovascular: RRR, nl S1-S2, no murmurs appreciated.   No LE edema.    Respiratory: Normal respiratory rate and rhythm.  CTAB without rales or wheezes. Abdominal: Abdomen soft and non-tender.  No distension or HSM.   Neuro/Psych: Strength symmetric in upper and lower extremities, generalized weakness.  Judgment and insight appear imapried but at baseline.   Condition at discharge: fair  The results of significant diagnostics from this hospitalization (including imaging, microbiology, ancillary and laboratory) are listed below for reference.   Imaging Studies: DG Pelvis 1-2 Views Result Date: 10/24/2023 CLINICAL DATA:  Right hip pain and weakness for 1 day. EXAM: PELVIS - 1-2 VIEW COMPARISON:  08/17/2022 FINDINGS: Mild degenerative changes in the lower lumbar spine and in both hips. No focal bone lesion or bone destruction identified. No acute displaced fractures. SI joints and symphysis pubis are not displaced. Soft tissues are unremarkable. IMPRESSION: Mild degenerative changes  in the hips.  No acute bony abnormalities. Electronically Signed   By: Boyce Byes M.D.   On: 10/24/2023 19:27   CT Head Wo Contrast Result Date: 10/24/2023 CLINICAL DATA:  Mental status change EXAM: CT HEAD WITHOUT CONTRAST TECHNIQUE: Contiguous axial images were obtained from the base of the skull through the vertex without intravenous contrast. RADIATION DOSE REDUCTION: This exam was performed according to the departmental dose-optimization program which includes automated exposure control, adjustment of the mA and/or kV according to patient size and/or use of iterative reconstruction technique. COMPARISON:  CT head 04/18/2023 and MRI head 04/19/2023 FINDINGS: Brain: No intracranial hemorrhage, mass effect, or evidence of acute infarct. No hydrocephalus. No extra-axial fluid collection. Generalized cerebral atrophy and chronic small vessel ischemic disease. Vascular: No hyperdense vessel. Intracranial arterial calcification. Skull: No fracture or focal lesion. Sinuses/Orbits: No acute  finding. Other: None. IMPRESSION: No acute intracranial abnormality. Electronically Signed   By: Rozell Cornet M.D.   On: 10/24/2023 18:41   DG Chest Portable 1 View Result Date: 10/24/2023 CLINICAL DATA:  Altered mental status. EXAM: PORTABLE CHEST 1 VIEW COMPARISON:  04/18/2023. FINDINGS: Bilateral lung fields are clear. Bilateral costophrenic angles are clear. Normal cardio-mediastinal silhouette. No acute osseous abnormalities. The soft tissues are within normal limits. IMPRESSION: No active disease. Electronically Signed   By: Beula Brunswick M.D.   On: 10/24/2023 17:30    Microbiology: Results for orders placed or performed during the hospital encounter of 10/24/23  Resp panel by RT-PCR (RSV, Flu A&B, Covid) Anterior Nasal Swab     Status: Abnormal   Collection Time: 10/24/23  7:47 PM   Specimen: Anterior Nasal Swab  Result Value Ref Range Status   SARS Coronavirus 2 by RT PCR POSITIVE (A) NEGATIVE Final    Comment: (NOTE) SARS-CoV-2 target nucleic acids are DETECTED.  The SARS-CoV-2 RNA is generally detectable in upper respiratory specimens during the acute phase of infection. Positive results are indicative of the presence of the identified virus, but do not rule out bacterial infection or co-infection with other pathogens not detected by the test. Clinical correlation with patient history and other diagnostic information is necessary to determine patient infection status. The expected result is Negative.  Fact Sheet for Patients: BloggerCourse.com  Fact Sheet for Healthcare Providers: SeriousBroker.it  This test is not yet approved or cleared by the [place]  FDA and  has been authorized for detection and/or diagnosis of SARS-CoV-2 by FDA under an Emergency Use Authorization (EUA).  This EUA will remain in effect (meaning this test can be used) for the duration of  the COVID-19 declaration under Section 564(b)(1) of the  A ct, 21 U.S.C. section 360bbb-3(b)(1), unless the authorization is terminated or revoked sooner.     Influenza A by PCR NEGATIVE NEGATIVE Final   Influenza B by PCR NEGATIVE NEGATIVE Final    Comment: (NOTE) The Xpert Xpress SARS-CoV-2/FLU/RSV plus assay is intended as an aid in the diagnosis of influenza from Nasopharyngeal swab specimens and should not be used as a sole basis for treatment. Nasal washings and aspirates are unacceptable for Xpert Xpress SARS-CoV-2/FLU/RSV testing.  Fact Sheet for Patients: BloggerCourse.com  Fact Sheet for Healthcare Providers: SeriousBroker.it  This test is not yet approved or cleared by the [place]  FDA and has been authorized for detection and/or diagnosis of SARS-CoV-2 by FDA under an Emergency Use Authorization (EUA). This EUA will remain in effect (meaning this test can be used) for the duration of the COVID-19 declaration under Section 564(b)(1)  of the Act, 21 U.S.C. section 360bbb-3(b)(1), unless the authorization is terminated or revoked.     Resp Syncytial Virus by PCR NEGATIVE NEGATIVE Final    Comment: (NOTE) Fact Sheet for Patients: BloggerCourse.com  Fact Sheet for Healthcare Providers: SeriousBroker.it  This test is not yet approved or cleared by the [place]  FDA and has been authorized for detection and/or diagnosis of SARS-CoV-2 by FDA under an Emergency Use Authorization (EUA). This EUA will remain in effect (meaning this test can be used) for the duration of the COVID-19 declaration under Section 564(b)(1) of the Act, 21 U.S.C. section 360bbb-3(b)(1), unless the authorization is terminated or revoked.  Performed at [facility], [address] [place], [place] [address]     Labs: CBC: Recent Labs  Lab 10/24/23 1700 10/25/23 0531 10/26/23 0547 10/27/23 0808 10/28/23 0535  WBC  11.9* 9.8 7.9 8.3 8.4  NEUTROABS 10.4*  --  4.8  --   --   HGB 13.3 11.6* 12.5 12.3 11.9*  HCT 41.6 36.2 39.8 39.9 36.9  MCV 91.0 91.4 92.3 94.5 92.3  PLT 239 198 195 226 219   Basic Metabolic Panel: Recent Labs  Lab 10/24/23 1700 10/25/23 0531 10/26/23 0547 10/26/23 0858 10/27/23 0603 10/28/23 0535  NA 136 138 140  --  137 138  K 3.8 3.9 3.5  --  3.9 3.8  CL 101 108 108  --  105 106  CO2 25 23 26   --  22 24  GLUCOSE 157* 140* 94  --  107* 90  BUN 13 16 12   --  10 12  CREATININE 0.91 0.81 0.77  --  0.51 0.67  CALCIUM  8.9 8.3* 8.3*  --  8.4* 8.5*  MG  --   --   --  1.5* 2.2 2.0   Liver Function Tests: Recent Labs  Lab 10/24/23 1700 10/25/23 0531 10/26/23 0547 10/27/23 0603  AST 69* 239* 261* 190*  ALT 21 45* 58* 54*  ALKPHOS 118 91 89 84  BILITOT 0.6 0.5 0.5 0.6  PROT 7.2 5.8* 6.3* 6.5  ALBUMIN 3.6 2.7* 2.8* 2.9*   CBG: Recent Labs  Lab 10/24/23 1917  GLUCAP 136*    Discharge time spent: approximately 25 minutes spent on discharge counseling, evaluation of patient on day of discharge, and coordination of discharge planning with nursing, social work, pharmacy and case management  Signed: Ephriam Hashimoto, MD [facility] 10/28/2023

## 2023-10-28 NOTE — TOC Transition Note (Addendum)
 Transition of Care [facility]) - Discharge Note   Patient Details  Name: Brenda Hammond MRN: [id number] Date of Birth: 1932/10/28  [facility] CM[facility]SW Contact:  Jonni Nettle, LCSW Phone Number: 10/28/2023, 2:29 PM   Clinical Narrative:    Pt to discharge today to [facility] with [facility] PT/OT services. Pt to receive HH services through [facility]. CSW spoke with [place] [facility] to confirm. Rollator recommended by PT. Rollator ordered through [employer]; to be delivered to [facility]. Pt's daughter, Olive Better, to transport pt upon discharge. D/C packet with signed DNR placed at RN station. RN to call report to [phone]. No further TOC needs at this time.  Final next level of care: Memory Care Barriers to Discharge: Continued Medical Work up   Patient Goals and CMS Choice Patient states their goals for this hospitalization and ongoing recovery are:: To return to [facility]        Discharge Placement  [facility]             Discharge Plan and Services Additional resources added to the After Visit Summary for [facility] PT/OT with [facility] In-house Referral: Clinical Social Work Discharge Planning Services: NA Post Acute Care Choice: Resumption of Svcs/PTA Provider          DME Arranged: N/A   HH Arranged: PT/OT services with [facility]   Social Drivers of Health (SDOH) Interventions SDOH Screenings   Food Insecurity: No Food Insecurity (10/25/2023)  Housing: Low Risk  (10/25/2023)  Transportation Needs: No Transportation Needs (10/25/2023)  Utilities: Not At Risk (10/25/2023)  Depression (PHQ2-9): Low Risk  (10/13/2021)  Financial Resource Strain: Low Risk  (10/24/2019)  Social Connections: Socially Integrated (10/26/2023)  Tobacco Use: Low Risk  (10/24/2023)     Readmission Risk Interventions     No data to display          [place], MSW, LCSW 10/28/2023 2:37 PM

## 2023-10-28 NOTE — NC FL2 (Addendum)
 [place]  MEDICAID FL2 LEVEL OF CARE FORM     IDENTIFICATION  Patient Name: Brenda Hammond Birthdate: June 14, 1933 Sex: female Admission Date (Current Location): 10/24/2023  [employer] and [place] Number:  Producer, television/film/video and Address:  [facility]  [address], [place] [address]      Provider Number: [id number]  Attending Physician Name and Address:  Ephriam Hashimoto, *  Relative Name and Phone Number:  Olive Better (Daughter)  [phone]    Current Level of Care: Hospital Recommended Level of Care: Memory Care Prior Approval Number:    Date Approved/Denied:   PASRR Number:    Discharge Plan: Other (Comment)    Current Diagnoses: Patient Active Problem List   Diagnosis Date Noted   COVID 10/24/2023   Rhabdomyolysis 10/24/2023   Acute metabolic encephalopathy 10/24/2023   Essential hypertension 10/24/2023   Syncope 07/13/2022   IFG (impaired fasting glucose) 04/23/2021   Age-related osteoporosis without current pathological fracture 04/23/2021   Syncope and collapse 07/03/2020   Memory disorder 03/01/2019   Medicare annual wellness visit, subsequent 03/21/2017   Routine adult health maintenance 03/21/2017   History of atrial fibrillation 11/26/2015   Edema 11/26/2015   Right leg swelling 02/06/2014   Low back pain 12/06/2012   Osteopenia 12/06/2012   Vitamin D  deficiency 12/06/2012   Malignant neoplasm of colon (HCC) 09/08/2009   ANEMIA 09/08/2009   Venous (peripheral) insufficiency 09/08/2009   GERD 09/08/2009   UTI 09/08/2009   Osteoarthritis 09/08/2009   OTHER DRUG ALLERGY 09/08/2009   Paroxysmal atrial fibrillation (HCC) 08/21/2009   Hyperlipidemia 10/30/2008   Anxiety state 10/30/2008   Fibromyalgia 10/30/2008   Idiopathic scoliosis and kyphoscoliosis 10/30/2008   POLIOMYELITIS, HX OF 10/30/2008    Orientation RESPIRATION BLADDER Height & Weight     Self  Normal Incontinent Weight: 118 lb 2.7 oz (53.6  kg) Height:  5\' 1"  (154.9 cm)  BEHAVIORAL SYMPTOMS/MOOD NEUROLOGICAL BOWEL NUTRITION STATUS      Continent  (no salt added)  AMBULATORY STATUS COMMUNICATION OF NEEDS Skin   Supervision (contact gaurd assist with RW) Verbally Normal                       Personal Care Assistance Level of Assistance  Bathing, Feeding, Dressing Bathing Assistance: Limited assistance Feeding assistance: Limited assistance Dressing Assistance: Limited assistance     Functional Limitations Info  Sight, Hearing, Speech Sight Info: Impaired Hearing Info: Adequate Speech Info: Adequate    SPECIAL CARE FACTORS FREQUENCY  PT (By licensed PT), OT (By licensed OT)     PT Frequency: 2-3x per week OT Frequency: 2-3x per week            Contractures Contractures Info: Not present    Additional Factors Info  Code Status, Allergies Code Status Info: DNR Allergies Info: Sulfa Antibiotics, Alendronate , Aspirin, Cyclobenzaprine Hcl, Metaxalone, Nitrofurantoin, Aricept  (Donepezil ), Nortriptyline Hcl, Nsaids, Penicillins           Current Medications (10/28/2023):  This is the current hospital active medication list Current Facility-Administered Medications  Medication Dose Route Frequency Provider Last Rate Last Admin   acetaminophen  (TYLENOL ) tablet 650 mg  650 mg Oral Q6H PRN Rathore, Vasundhra, MD   650 mg at 10/27/23 2158   Or   acetaminophen  (TYLENOL ) suppository 650 mg  650 mg Rectal Q6H PRN Juliette Oh, MD       [START ON 10/29/2023] apixaban  (ELIQUIS ) tablet 2.5 mg  2.5 mg Oral BID Danford, Christopher P,  MD       enoxaparin  (LOVENOX ) injection 52.5 mg  1 mg/kg Subcutaneous Q12H Danford, Willis Harter, MD   52.5 mg at 10/28/23 1040   feeding supplement (ENSURE ENLIVE / ENSURE PLUS) liquid 237 mL  237 mL Oral TID BM Juliette Oh, MD   237 mL at 10/28/23 1441   guaiFENesin -dextromethorphan (ROBITUSSIN DM) 100-10 MG/5ML syrup 5 mL  5 mL Oral Q4H PRN Rathore, Vasundhra, MD        haloperidol  lactate (HALDOL ) injection 2 mg  2 mg Intravenous Once PRN Daniels, James K, NP       hydrALAZINE  (APRESOLINE ) injection 5 mg  5 mg Intravenous Q6H PRN Rathore, Vasundhra, MD   5 mg at 10/27/23 2159   metoprolol  tartrate (LOPRESSOR ) tablet 25 mg  25 mg Oral BID Ephriam Hashimoto, MD   25 mg at 10/28/23 1040   nirmatrelvir /ritonavir  (renal dosing) (PAXLOVID ) 2 tablet  2 tablet Oral BID Rathore, Vasundhra, MD   2 tablet at 10/28/23 1040     Discharge Medications: bisacodyl 5 MG EC tablet Commonly known as: DULCOLAX Take 5 mg by mouth daily as needed for moderate constipation.    cyanocobalamin  1000 MCG tablet Take 1 tablet (1,000 mcg total) by mouth daily.    guaiFENesin -dextromethorphan 100-10 MG/5ML syrup Commonly known as: ROBITUSSIN DM Take 5 mLs by mouth every 4 (four) hours as needed for cough.    melatonin 3 MG Tabs tablet Take 1 tablet (3 mg total) by mouth at bedtime as needed (insomnia).    metoprolol  succinate 50 MG 24 hr tablet Commonly known as: Toprol  XL Take 1 tablet (50 mg total) by mouth daily.    nirmatrelvir /ritonavir  (renal dosing) 10 x 150 MG & 10 x 100MG  Tabs Commonly known as: PAXLOVID  Take 2 tablets by mouth 2 (two) times daily for 1 day. Patient GFR is 36. Take nirmatrelvir  (150 mg) one tablet twice daily for 5 days and ritonavir  (100 mg) one tablet twice daily for 2 more doses.    PSYLLIUM PO Take 0.4 g by mouth at bedtime.    Tylenol  8 Hour Arthritis Pain 650 MG CR tablet Generic drug: acetaminophen  Take 650 mg by mouth every 12 (twelve) hours as needed for pain.    Vitamin D3 50 MCG (2000 UT) Tabs Take by mouth.    Relevant Imaging Results:  Relevant Lab Results:   Additional Information SS#: [id number]  Melinda Sprawls Ercole Georg, LCSW

## 2023-10-28 NOTE — Plan of Care (Signed)

## 2023-10-28 NOTE — Progress Notes (Signed)
 Physical Therapy Treatment Patient Details Name: Brenda Hammond MRN: [id number] DOB: 02-27-1933 Today's Date: 10/28/2023   History of Present Illness This is an 89 year old female presenting ED 10/24/27 with generalized weakness increased confusion, positive for covid. QMV:HQIONGEX, afib, scoliosis, UTI,fibromyalgia, anxiety, polymyalgia. Patient comes  from [place] [facility] facility/[facility]ory careth [facility]s.    PT Comments  Attempted to see Pt earlier, however she was sleeping.  Easily aroused but Pt not willing to get up at that moment.  "Just let me be", stated Pt.   Returned later and Daughter had Pt in the bathroom.  Daughter was very helpful and assertive.  Assisted on/off toilet then amb in hallway.  General Gait Details: amb in hallway with walker at Contact Guard Assist with mild instbility with tuns and VC's on proper walker to self distance as Pt tends to push too far front.  Trial amb with cane as prior however too unsteady and near total LOB.  Reverted back to RW.  Daughter asking for a Rollator. Pt tolerated the distance well.   Per Daughter, Pt will be returning to her ALF.  Daughter requesting a [phone].  Pt also will receive HH PT/OT per chart review.     If plan is discharge home, recommend the following: Direct supervision/assist for medications management;Assist for transportation;Supervision due to cognitive status;A little help with walking and/or transfers;A little help with bathing/dressing/bathroom   Can travel by private vehicle     Yes  Equipment Recommendations  Rollator (4 wheels)    Recommendations for Other Services       Precautions / Restrictions Precautions Precautions: Fall Precaution/Restrictions Comments: Hx Dementia Restrictions Weight Bearing Restrictions Per Provider Order: No     Mobility  Bed Mobility               General bed mobility comments: OOB in bathroom with Daughter    Transfers Overall transfer level: Needs  assistance Equipment used: Rolling walker (2 wheels) Transfers: Sit to/from Stand Sit to Stand: Min assist, Contact guard assist           General transfer comment: slightly unsteady with turns plus VC's on walker use/reminder    Ambulation/Gait Ambulation/Gait assistance: Contact guard assist, Min assist Gait Distance (Feet): 55 Feet Assistive device: Rolling walker (2 wheels), Straight cane Gait Pattern/deviations: Step-through pattern, Decreased stride length, Wide base of support Gait velocity: decreased     General Gait Details: amb in hallway with walker at Contact Guard Assist with mild instbility with tuns and VC's on proper walker to self distance as Pt tends to push too far front.  Trial amb with cane as prior however too unsteady and near total LOB.  Reverted back to RW.  Daughter asking for a Rollator.   Stairs             Wheelchair Mobility     Tilt Bed    Modified Rankin (Stroke Patients Only)       Balance                                            Communication Communication Communication: Impaired Factors Affecting Communication: Difficulty expressing self  Cognition Arousal: Alert Behavior During Therapy: WFL for tasks assessed/performed   PT - Cognitive impairments: History of cognitive impairments  PT - Cognition Comments: AxO x 1 responds well to Daughter.  Pt resides at Memory Care Unit at ALF Following commands: Intact Following commands impaired: Follows one step commands inconsistently    Cueing Cueing Techniques: Verbal cues, Gestural cues, Tactile cues  Exercises      General Comments        Pertinent Vitals/Pain Pain Assessment Pain Assessment: No/denies pain    Home Living                          Prior Function            PT Goals (current goals can now be found in the care plan section) Progress towards PT goals: Progressing toward goals     Frequency    Min 2X/week      PT Plan      Co-evaluation              AM-PAC PT "6 Clicks" Mobility   Outcome Measure  Help needed turning from your back to your side while in a flat bed without using bedrails?: A Little Help needed moving from lying on your back to sitting on the side of a flat bed without using bedrails?: A Little Help needed moving to and from a bed to a chair (including a wheelchair)?: A Little Help needed standing up from a chair using your arms (e.g., wheelchair or bedside chair)?: A Little Help needed to walk in hospital room?: A Little Help needed climbing 3-5 steps with a railing? : A Little 6 Click Score: 18    End of Session Equipment Utilized During Treatment: Gait belt Activity Tolerance: Patient tolerated treatment well Patient left: in chair;with call bell/phone within reach;with chair alarm set;with family/visitor present Nurse Communication: Mobility status PT Visit Diagnosis: Other abnormalities of gait and mobility (R26.89);Repeated falls (R29.6);Muscle weakness (generalized) (M62.81);Difficulty in walking, not elsewhere classified (R26.2)     Time: 1207-1228 PT Time Calculation (min) (ACUTE ONLY): 21 min  Charges:    $Gait Training: 8-22 mins PT General Charges $$ ACUTE PT VISIT: 1 Visit                     Bess Broody  PTA Acute  Rehabilitation Services Office M-F          [phone]

## 2023-11-01 DIAGNOSIS — G9341 Metabolic encephalopathy: Secondary | ICD-10-CM | POA: Diagnosis not present

## 2023-11-01 DIAGNOSIS — I10 Essential (primary) hypertension: Secondary | ICD-10-CM | POA: Diagnosis not present

## 2023-11-01 DIAGNOSIS — M6282 Rhabdomyolysis: Secondary | ICD-10-CM | POA: Diagnosis not present

## 2023-11-01 DIAGNOSIS — E782 Mixed hyperlipidemia: Secondary | ICD-10-CM | POA: Diagnosis not present

## 2023-11-01 DIAGNOSIS — I69393 Ataxia following cerebral infarction: Secondary | ICD-10-CM | POA: Diagnosis not present

## 2023-11-01 DIAGNOSIS — I480 Paroxysmal atrial fibrillation: Secondary | ICD-10-CM | POA: Diagnosis not present

## 2023-11-01 DIAGNOSIS — U071 COVID-19: Secondary | ICD-10-CM | POA: Diagnosis not present

## 2023-11-07 DIAGNOSIS — I69393 Ataxia following cerebral infarction: Secondary | ICD-10-CM | POA: Diagnosis not present

## 2023-11-07 DIAGNOSIS — I480 Paroxysmal atrial fibrillation: Secondary | ICD-10-CM | POA: Diagnosis not present

## 2023-11-07 DIAGNOSIS — M6282 Rhabdomyolysis: Secondary | ICD-10-CM | POA: Diagnosis not present

## 2023-11-07 DIAGNOSIS — U071 COVID-19: Secondary | ICD-10-CM | POA: Diagnosis not present

## 2023-11-07 DIAGNOSIS — G9341 Metabolic encephalopathy: Secondary | ICD-10-CM | POA: Diagnosis not present

## 2023-11-08 DIAGNOSIS — I480 Paroxysmal atrial fibrillation: Secondary | ICD-10-CM | POA: Diagnosis not present

## 2023-11-08 DIAGNOSIS — K5904 Chronic idiopathic constipation: Secondary | ICD-10-CM | POA: Diagnosis not present

## 2023-11-10 DIAGNOSIS — G9341 Metabolic encephalopathy: Secondary | ICD-10-CM | POA: Diagnosis not present

## 2023-11-10 DIAGNOSIS — I480 Paroxysmal atrial fibrillation: Secondary | ICD-10-CM | POA: Diagnosis not present

## 2023-11-10 DIAGNOSIS — M6282 Rhabdomyolysis: Secondary | ICD-10-CM | POA: Diagnosis not present

## 2023-11-10 DIAGNOSIS — I69393 Ataxia following cerebral infarction: Secondary | ICD-10-CM | POA: Diagnosis not present

## 2023-11-10 DIAGNOSIS — U071 COVID-19: Secondary | ICD-10-CM | POA: Diagnosis not present

## 2023-11-14 DIAGNOSIS — R059 Cough, unspecified: Secondary | ICD-10-CM | POA: Diagnosis not present

## 2023-11-14 DIAGNOSIS — I480 Paroxysmal atrial fibrillation: Secondary | ICD-10-CM | POA: Diagnosis not present

## 2023-11-14 DIAGNOSIS — R0989 Other specified symptoms and signs involving the circulatory and respiratory systems: Secondary | ICD-10-CM | POA: Diagnosis not present

## 2023-11-14 DIAGNOSIS — I69393 Ataxia following cerebral infarction: Secondary | ICD-10-CM | POA: Diagnosis not present

## 2023-11-14 DIAGNOSIS — M6282 Rhabdomyolysis: Secondary | ICD-10-CM | POA: Diagnosis not present

## 2023-11-14 DIAGNOSIS — G9341 Metabolic encephalopathy: Secondary | ICD-10-CM | POA: Diagnosis not present

## 2023-11-14 DIAGNOSIS — U071 COVID-19: Secondary | ICD-10-CM | POA: Diagnosis not present

## 2023-11-15 ENCOUNTER — Emergency Department (EMERGENCY_DEPARTMENT_HOSPITAL)

## 2023-11-15 ENCOUNTER — Emergency Department (HOSPITAL_COMMUNITY)

## 2023-11-15 ENCOUNTER — Other Ambulatory Visit: Payer: Self-pay

## 2023-11-15 ENCOUNTER — Inpatient Hospital Stay (HOSPITAL_COMMUNITY)
Admission: EM | Admit: 2023-11-15 | Discharge: 2023-11-18 | DRG: 100 | Disposition: A | Discharge status: Skilled Nursing Facility | Source: Skilled Nursing Facility | Attending: Internal Medicine | Admitting: Internal Medicine

## 2023-11-15 ENCOUNTER — Encounter (HOSPITAL_COMMUNITY): Payer: Self-pay

## 2023-11-15 DIAGNOSIS — M79605 Pain in left leg: Secondary | ICD-10-CM

## 2023-11-15 DIAGNOSIS — Z881 Allergy status to other antibiotic agents status: Secondary | ICD-10-CM | POA: Diagnosis not present

## 2023-11-15 DIAGNOSIS — R296 Repeated falls: Secondary | ICD-10-CM | POA: Diagnosis not present

## 2023-11-15 DIAGNOSIS — F0390 Unspecified dementia without behavioral disturbance: Secondary | ICD-10-CM | POA: Diagnosis present

## 2023-11-15 DIAGNOSIS — R1310 Dysphagia, unspecified: Secondary | ICD-10-CM | POA: Diagnosis not present

## 2023-11-15 DIAGNOSIS — I499 Cardiac arrhythmia, unspecified: Secondary | ICD-10-CM | POA: Diagnosis not present

## 2023-11-15 DIAGNOSIS — G9341 Metabolic encephalopathy: Secondary | ICD-10-CM | POA: Diagnosis not present

## 2023-11-15 DIAGNOSIS — R569 Unspecified convulsions: Principal | ICD-10-CM

## 2023-11-15 DIAGNOSIS — I4891 Unspecified atrial fibrillation: Secondary | ICD-10-CM

## 2023-11-15 DIAGNOSIS — E785 Hyperlipidemia, unspecified: Secondary | ICD-10-CM | POA: Diagnosis not present

## 2023-11-15 DIAGNOSIS — G319 Degenerative disease of nervous system, unspecified: Secondary | ICD-10-CM | POA: Diagnosis present

## 2023-11-15 DIAGNOSIS — R0902 Hypoxemia: Secondary | ICD-10-CM | POA: Diagnosis not present

## 2023-11-15 DIAGNOSIS — I6521 Occlusion and stenosis of right carotid artery: Secondary | ICD-10-CM | POA: Diagnosis not present

## 2023-11-15 DIAGNOSIS — Z85038 Personal history of other malignant neoplasm of large intestine: Secondary | ICD-10-CM

## 2023-11-15 DIAGNOSIS — I739 Peripheral vascular disease, unspecified: Secondary | ICD-10-CM | POA: Diagnosis not present

## 2023-11-15 DIAGNOSIS — R29711 NIHSS score 11: Secondary | ICD-10-CM | POA: Diagnosis not present

## 2023-11-15 DIAGNOSIS — E8809 Other disorders of plasma-protein metabolism, not elsewhere classified: Secondary | ICD-10-CM | POA: Diagnosis not present

## 2023-11-15 DIAGNOSIS — Z7982 Long term (current) use of aspirin: Secondary | ICD-10-CM | POA: Diagnosis not present

## 2023-11-15 DIAGNOSIS — U071 COVID-19: Secondary | ICD-10-CM | POA: Diagnosis not present

## 2023-11-15 DIAGNOSIS — G301 Alzheimer's disease with late onset: Secondary | ICD-10-CM | POA: Diagnosis not present

## 2023-11-15 DIAGNOSIS — R627 Adult failure to thrive: Secondary | ICD-10-CM | POA: Diagnosis not present

## 2023-11-15 DIAGNOSIS — I480 Paroxysmal atrial fibrillation: Secondary | ICD-10-CM | POA: Diagnosis present

## 2023-11-15 DIAGNOSIS — Z9049 Acquired absence of other specified parts of digestive tract: Secondary | ICD-10-CM

## 2023-11-15 DIAGNOSIS — Z882 Allergy status to sulfonamides status: Secondary | ICD-10-CM

## 2023-11-15 DIAGNOSIS — I6601 Occlusion and stenosis of right middle cerebral artery: Secondary | ICD-10-CM | POA: Diagnosis not present

## 2023-11-15 DIAGNOSIS — I6340 Cerebral infarction due to embolism of unspecified cerebral artery: Secondary | ICD-10-CM | POA: Diagnosis not present

## 2023-11-15 DIAGNOSIS — R9089 Other abnormal findings on diagnostic imaging of central nervous system: Secondary | ICD-10-CM | POA: Diagnosis not present

## 2023-11-15 DIAGNOSIS — Z888 Allergy status to other drugs, medicaments and biological substances status: Secondary | ICD-10-CM

## 2023-11-15 DIAGNOSIS — I672 Cerebral atherosclerosis: Secondary | ICD-10-CM | POA: Diagnosis not present

## 2023-11-15 DIAGNOSIS — I6389 Other cerebral infarction: Secondary | ICD-10-CM | POA: Diagnosis not present

## 2023-11-15 DIAGNOSIS — Z880 Allergy status to penicillin: Secondary | ICD-10-CM

## 2023-11-15 DIAGNOSIS — N3289 Other specified disorders of bladder: Secondary | ICD-10-CM | POA: Diagnosis not present

## 2023-11-15 DIAGNOSIS — Z7902 Long term (current) use of antithrombotics/antiplatelets: Secondary | ICD-10-CM | POA: Diagnosis not present

## 2023-11-15 DIAGNOSIS — I10 Essential (primary) hypertension: Secondary | ICD-10-CM | POA: Diagnosis present

## 2023-11-15 DIAGNOSIS — Z808 Family history of malignant neoplasm of other organs or systems: Secondary | ICD-10-CM

## 2023-11-15 DIAGNOSIS — I700 Atherosclerosis of aorta: Secondary | ICD-10-CM | POA: Diagnosis not present

## 2023-11-15 DIAGNOSIS — Z8612 Personal history of poliomyelitis: Secondary | ICD-10-CM

## 2023-11-15 DIAGNOSIS — M797 Fibromyalgia: Secondary | ICD-10-CM | POA: Diagnosis not present

## 2023-11-15 DIAGNOSIS — Z886 Allergy status to analgesic agent status: Secondary | ICD-10-CM | POA: Diagnosis not present

## 2023-11-15 DIAGNOSIS — Z823 Family history of stroke: Secondary | ICD-10-CM

## 2023-11-15 DIAGNOSIS — M6282 Rhabdomyolysis: Secondary | ICD-10-CM | POA: Diagnosis not present

## 2023-11-15 DIAGNOSIS — Z8616 Personal history of COVID-19: Secondary | ICD-10-CM | POA: Diagnosis not present

## 2023-11-15 DIAGNOSIS — R29703 NIHSS score 3: Secondary | ICD-10-CM | POA: Diagnosis not present

## 2023-11-15 DIAGNOSIS — I639 Cerebral infarction, unspecified: Secondary | ICD-10-CM | POA: Diagnosis not present

## 2023-11-15 DIAGNOSIS — Z66 Do not resuscitate: Secondary | ICD-10-CM | POA: Diagnosis present

## 2023-11-15 DIAGNOSIS — R109 Unspecified abdominal pain: Secondary | ICD-10-CM | POA: Diagnosis not present

## 2023-11-15 DIAGNOSIS — R059 Cough, unspecified: Secondary | ICD-10-CM | POA: Diagnosis not present

## 2023-11-15 DIAGNOSIS — G40909 Epilepsy, unspecified, not intractable, without status epilepticus: Principal | ICD-10-CM | POA: Diagnosis present

## 2023-11-15 DIAGNOSIS — I69391 Dysphagia following cerebral infarction: Secondary | ICD-10-CM | POA: Diagnosis not present

## 2023-11-15 DIAGNOSIS — Z79899 Other long term (current) drug therapy: Secondary | ICD-10-CM

## 2023-11-15 DIAGNOSIS — I6782 Cerebral ischemia: Secondary | ICD-10-CM | POA: Diagnosis not present

## 2023-11-15 LAB — URINALYSIS, W/ REFLEX TO CULTURE (INFECTION SUSPECTED)
Bacteria, UA: NONE SEEN
Bilirubin Urine: NEGATIVE
Glucose, UA: NEGATIVE mg/dL
Hgb urine dipstick: NEGATIVE
Ketones, ur: 20 mg/dL — AB
Nitrite: NEGATIVE
Protein, ur: NEGATIVE mg/dL
Specific Gravity, Urine: 1.028 (ref 1.005–1.030)
pH: 7 (ref 5.0–8.0)

## 2023-11-15 LAB — CBC WITH DIFFERENTIAL/PLATELET
Abs Immature Granulocytes: 0.02 10*3/uL (ref 0.00–0.07)
Basophils Absolute: 0 10*3/uL (ref 0.0–0.1)
Basophils Relative: 0 %
Eosinophils Absolute: 0.1 10*3/uL (ref 0.0–0.5)
Eosinophils Relative: 2 %
HCT: 41 % (ref 36.0–46.0)
Hemoglobin: 13.2 g/dL (ref 12.0–15.0)
Immature Granulocytes: 0 %
Lymphocytes Relative: 15 %
Lymphs Abs: 1 10*3/uL (ref 0.7–4.0)
MCH: 28.8 pg (ref 26.0–34.0)
MCHC: 32.2 g/dL (ref 30.0–36.0)
MCV: 89.3 fL (ref 80.0–100.0)
Monocytes Absolute: 0.7 10*3/uL (ref 0.1–1.0)
Monocytes Relative: 10 %
Neutro Abs: 4.9 10*3/uL (ref 1.7–7.7)
Neutrophils Relative %: 73 %
Platelets: 201 10*3/uL (ref 150–400)
RBC: 4.59 MIL/uL (ref 3.87–5.11)
RDW: 13.4 % (ref 11.5–15.5)
WBC: 6.8 10*3/uL (ref 4.0–10.5)
nRBC: 0 % (ref 0.0–0.2)

## 2023-11-15 LAB — MAGNESIUM: Magnesium: 1.9 mg/dL (ref 1.7–2.4)

## 2023-11-15 LAB — COMPREHENSIVE METABOLIC PANEL WITH GFR
ALT: 15 U/L (ref 0–44)
AST: 26 U/L (ref 15–41)
Albumin: 3 g/dL — ABNORMAL LOW (ref 3.5–5.0)
Alkaline Phosphatase: 91 U/L (ref 38–126)
Anion gap: 9 (ref 5–15)
BUN: 10 mg/dL (ref 8–23)
CO2: 27 mmol/L (ref 22–32)
Calcium: 8.7 mg/dL — ABNORMAL LOW (ref 8.9–10.3)
Chloride: 101 mmol/L (ref 98–111)
Creatinine, Ser: 0.89 mg/dL (ref 0.44–1.00)
GFR, Estimated: >60 mL/min (ref >60)
Glucose, Bld: 115 mg/dL — ABNORMAL HIGH (ref 70–99)
Potassium: 3.8 mmol/L (ref 3.5–5.1)
Sodium: 137 mmol/L (ref 135–145)
Total Bilirubin: 0.6 mg/dL (ref 0.0–1.2)
Total Protein: 6.5 g/dL (ref 6.5–8.1)

## 2023-11-15 LAB — CBG MONITORING, ED: Glucose-Capillary: 83 mg/dL (ref 70–99)

## 2023-11-15 MED ORDER — DILTIAZEM HCL-DEXTROSE 125-5 MG/125ML-% IV SOLN (PREMIX)
5.0000 mg/h | INTRAVENOUS | Status: DC
  Administered 2023-11-15: 5 mg/h via INTRAVENOUS
  Filled 2023-11-15: qty 125

## 2023-11-15 MED ORDER — LEVETIRACETAM (KEPPRA) 500 MG/5 ML ADULT IV PUSH: 1000.0000 mg | Freq: Once | INTRAVENOUS | Status: DC

## 2023-11-15 MED ORDER — LORAZEPAM 2 MG/ML IJ SOLN
1.0000 mg | Freq: Once | INTRAMUSCULAR | Status: AC
  Administered 2023-11-15: 1 mg via INTRAVENOUS
  Filled 2023-11-15: qty 1

## 2023-11-15 MED ORDER — DILTIAZEM LOAD VIA INFUSION
10.0000 mg | Freq: Once | INTRAVENOUS | Status: AC
  Administered 2023-11-15: 10 mg via INTRAVENOUS
  Filled 2023-11-15: qty 10

## 2023-11-15 MED ORDER — IOHEXOL 350 MG/ML SOLN
75.0000 mL | Freq: Once | INTRAVENOUS | Status: AC | PRN
  Administered 2023-11-15: 75 mL via INTRAVENOUS

## 2023-11-15 MED ORDER — LEVETIRACETAM (KEPPRA) 500 MG/5 ML ADULT IV PUSH
500.0000 mg | Freq: Once | INTRAVENOUS | Status: AC
  Administered 2023-11-15: 500 mg via INTRAVENOUS
  Filled 2023-11-15: qty 5

## 2023-11-15 MED ORDER — LACTATED RINGERS IV BOLUS
500.0000 mL | Freq: Once | INTRAVENOUS | Status: AC
  Administered 2023-11-15: 500 mL via INTRAVENOUS

## 2023-11-15 MED ORDER — GADOBUTROL 1 MMOL/ML IV SOLN
5.0000 mL | Freq: Once | INTRAVENOUS | Status: AC | PRN
  Administered 2023-11-15: 5 mL via INTRAVENOUS

## 2023-11-15 NOTE — ED Notes (Signed)
 PT did not allow us  to in and out her. Pt did urinate while attempt was being made. This RN has place PT on a bed pan at this time

## 2023-11-15 NOTE — ED Notes (Signed)
 Called and placed PT on monitor with CCMD.

## 2023-11-15 NOTE — ED Triage Notes (Addendum)
 Pt bib ems from [facility] dementia unit; while NP there for a follow up visit for pt's husband, pt had seizure while sitting at table; reported fixed R gaze with tremors and posturing; seizure lasted approx 1 minute; postictal on ems arrival; pt pale, unsure if baseline; cbg 145, hr 54, 100%, 119/57; no seizure hx; pt alert to self only, baseline; ems reports pt coughed up food, reports clear lungs bilaterally; no recent illness reported

## 2023-11-15 NOTE — ED Notes (Signed)
 Pt family member to desk, says she removed bedpan from pt when she returned from CT. Went into check/clean pt up, circular area of redness lumbar area. Pt cleaned up, brief placed, turned on to left side. While cleaning, a silver ring with purple stone was in the bed and given to the patients daughter at the bedside.

## 2023-11-15 NOTE — ED Provider Notes (Signed)
 [place] EMERGENCY DEPARTMENT AT [facility] Provider Note   CSN: [id number] Arrival date & time: 11/15/23  1342     History  Chief Complaint  Patient presents with   Seizures    Brenda Hammond is a 89 y.o. female.  HPI Patient presents for a witnessed seizure.  Her medical history includes atrial fibrillation, GERD, arthritis, fibromyalgia, HLD, anemia, colon cancer, dementia, HTN.  She had a recent hospital admission for encephalopathy in the setting of COVID-19 infection.  She was discharged 18 days ago.  She right for memory care unit at [facility].  She had a witnessed episode while sitting at the table.  Staff report right-sided gaze, tremors with posturing.  Estimated duration was 1 minute.  EMS noted patient was pale.  Vital signs prior to arrival notable for widened pulse pressure.  On arrival, patient denies any physical complaints.    Home Medications Prior to Admission medications   Medication Sig Start Date End Date Taking? Authorizing Provider  Cholecalciferol (VITAMIN D3) 50 MCG (2000 UT) TABS Take 1 tablet by mouth daily.   Yes [provider]  cyanocobalamin  1000 MCG tablet Take 1 tablet (1,000 mcg total) by mouth daily. 04/20/23  Yes Adhikari, Amrit, MD  lidocaine (XYLOCAINE) 2 % solution Use as directed 15 mLs in the mouth or throat every 6 (six) hours as needed for mouth pain.   Yes [provider]  metoprolol  succinate (TOPROL  XL) 50 MG 24 hr tablet Take 1 tablet (50 mg total) by mouth daily. 10/28/23 10/27/24 Yes Danford, Willis Harter, MD  PSYLLIUM PO Take 0.4 g by mouth at bedtime.   Yes [provider]  bisacodyl (DULCOLAX) 5 MG EC tablet Take 5 mg by mouth daily as needed for moderate constipation.    [provider]  melatonin 3 MG TABS tablet Take 1 tablet (3 mg total) by mouth at bedtime as needed (insomnia). 04/19/23   Leona Rake, MD  TYLENOL  8 HOUR ARTHRITIS PAIN 650 MG CR tablet Take 650 mg by  mouth every 12 (twelve) hours as needed for pain.    [provider]      Allergies    Sulfa antibiotics, Alendronate , Aspirin, Cyclobenzaprine hcl, Metaxalone, Nitrofurantoin, Aricept  [donepezil ], Nortriptyline hcl, Nsaids, and Penicillins    Review of Systems   Review of Systems  Unable to perform ROS: Dementia  Neurological:  Positive for seizures.    Physical Exam Updated Vital Signs BP (!) 111/52   Pulse (!) 57   Temp (!) 96 F (35.6 C) (Axillary)   Resp 18   Ht 5\' 1"  (1.549 m)   Wt 53.6 kg   SpO2 97%   BMI 22.33 kg/m  Physical Exam Vitals and nursing note reviewed.  Constitutional:      General: She is not in acute distress.    Appearance: Normal appearance. She is well-developed. She is not ill-appearing, toxic-appearing or diaphoretic.  HENT:     Head: Normocephalic and atraumatic.     Right Ear: External ear normal.     Left Ear: External ear normal.     Nose: Nose normal.     Mouth/Throat:     Mouth: Mucous membranes are moist.  Eyes:     Extraocular Movements: Extraocular movements intact.     Conjunctiva/sclera: Conjunctivae normal.  Cardiovascular:     Rate and Rhythm: Normal rate and regular rhythm.     Heart sounds: No murmur heard. Pulmonary:     Effort: Pulmonary effort is normal.  No respiratory distress.     Breath sounds: Normal breath sounds. No wheezing or rales.  Chest:     Chest wall: No tenderness.  Abdominal:     General: There is no distension.     Palpations: Abdomen is soft.     Tenderness: There is abdominal tenderness. There is no guarding or rebound.  Musculoskeletal:        General: Normal range of motion.     Cervical back: Normal range of motion and neck supple.     Right lower leg: No edema.     Left lower leg: No edema.  Skin:    General: Skin is warm and dry.     Capillary Refill: Capillary refill takes less than 2 seconds.     Coloration: Skin is not jaundiced or pale.  Neurological:     General: No focal  deficit present.     Mental Status: She is alert. She is disoriented.     Sensory: No sensory deficit.     Motor: No weakness.     Coordination: Coordination normal.  Psychiatric:        Mood and Affect: Mood normal.        Behavior: Behavior normal.     ED Results / Procedures / Treatments   Labs (all labs ordered are listed, but only abnormal results are displayed) Labs Reviewed  COMPREHENSIVE METABOLIC PANEL WITH GFR - Abnormal; Notable for the following components:      Result Value   Glucose, Bld 115 (*)    Calcium  8.7 (*)    Albumin 3.0 (*)    All other components within normal limits  CBC WITH DIFFERENTIAL/PLATELET  MAGNESIUM   URINALYSIS, W/ REFLEX TO CULTURE (INFECTION SUSPECTED)  CBG MONITORING, ED    EKG EKG Interpretation Date/Time:  Tuesday Nov 15 2023 13:55:34 EDT Ventricular Rate:  54 PR Interval:  122 QRS Duration:  98 QT Interval:  433 QTC Calculation: 411 R Axis:   11  Text Interpretation: Sinus rhythm Confirmed by Iva Mariner ([id number]) on 11/15/2023 3:17:48 PM  Radiology CT HEAD WO CONTRAST Result Date: 11/15/2023 CLINICAL DATA:  Seizure, new-onset, no history of trauma. EXAM: CT HEAD WITHOUT CONTRAST TECHNIQUE: Contiguous axial images were obtained from the base of the skull through the vertex without intravenous contrast. RADIATION DOSE REDUCTION: This exam was performed according to the departmental dose-optimization program which includes automated exposure control, adjustment of the mA and/or kV according to patient size and/or use of iterative reconstruction technique. COMPARISON:  Head CT 10/24/2023 and MRI 04/19/2023 FINDINGS: Brain: There is no evidence of an acute infarct, intracranial hemorrhage, mass, midline shift, or extra-axial fluid collection. Patchy hypodensities in the cerebral white matter bilaterally are unchanged and nonspecific but compatible with moderate chronic small vessel ischemic disease small chronic bilateral cerebellar infarcts are  unchanged. Advanced cerebral atrophy is again noted including pronounced bilateral temporal lobe volume loss. Vascular: Calcified atherosclerosis at the skull base. No hyperdense vessel. Skull: No fracture or suspicious lesion. Sinuses/Orbits: Moderate right frontal and right ethmoid sinus opacification, possibly a combination of mucosal thickening and fluid/secretions. Mild mucosal thickening in the right maxillary sinus. Clear mastoid air cells. Bilateral cataract extraction. Other: None. IMPRESSION: 1. No evidence of acute intracranial abnormality. 2. Moderate chronic small vessel ischemic disease and advanced cerebral atrophy. Electronically Signed   By: Aundra Lee M.D.   On: 11/15/2023 16:19    Procedures Procedures    Medications Ordered in ED Medications  diltiazem (CARDIZEM) 1 mg/mL load via  infusion 10 mg (has no administration in time range)    And  diltiazem (CARDIZEM) 125 mg in dextrose 5% 125 mL (1 mg/mL) infusion (has no administration in time range)  LORazepam (ATIVAN) injection 1 mg (1 mg Intravenous Given 11/15/23 1519)  lactated ringers  bolus 500 mL (0 mLs Intravenous Stopped 11/15/23 1617)  levETIRAcetam (KEPPRA) undiluted injection 500 mg (500 mg Intravenous Given 11/15/23 1545)    ED Course/ Medical Decision Making/ A&P                                 Medical Decision Making Amount and/or Complexity of Data Reviewed Labs: ordered. Radiology: ordered.  Risk Prescription drug management.   This patient presents to the ED for concern of seizure-like activity, this involves an extensive number of treatment options, and is a complaint that carries with it a high risk of complications and morbidity.  The differential diagnosis includes, syncope, hypnagogic jerks, CVA, TIA, tremor, rigors   Co morbidities that complicate the patient evaluation  atrial fibrillation, GERD, arthritis, fibromyalgia, HLD, anemia, colon cancer, dementia, HTN   Additional history  obtained:  Additional history obtained from N/A External records from outside source obtained and reviewed including EMR   Lab Tests:  I Ordered, and personally interpreted labs.  The pertinent results include: Hemoglobin, no leukocytosis, normal kidney function, normal electrolytes   Imaging Studies ordered:  I ordered imaging studies including CT head, LLE DVT study, chest x-ray, CT of abdomen and pelvis, MRI brain I independently visualized and interpreted imaging which showed no acute findings on CT or x-ray; DVT study; CT of abdomen as well as MRI pending at time of signout. I agree with the radiologist interpretation   Cardiac Monitoring: / EKG:  The patient was maintained on a cardiac monitor.  I personally viewed and interpreted the cardiac monitored which showed an underlying rhythm of: Atrial fibrillation   Problem List / ED Course / Critical interventions / Medication management  Patient presents after witnessed seizure at memory care unit at [facility] facility.  Estimated duration was 1 minute.  On arrival in the ED, she is overall well-appearing.  She is able to tell me her name "Gertrude Kuba the Hyena".  She is otherwise disoriented.  She does not seem to have any focal neurologic deficits.  She has a generalized abdominal tenderness in addition to tenderness to left calf.  Given report of recent seizure, 1 mg of Ativan was ordered.  Workup was initiated.  I spoke with neurologist on-call, Dr. Cleone Dad, who stated that patient would be able to be discharged if she is back to her baseline.  Onset of seizure likely secondary to chronic brain atrophy.  Dr.  Cleone Dad did recommend initiating Keppra, 250 mg twice daily.  Keppra loading dose was ordered.  On reassessment, patient now in atrial fibrillation with RVR.  Blood pressures in the range of 140s to 150s systolic.  She is also somnolent and less responsive than she was on arrival.  She does continue to be able to follow commands.   She is no longer conversant.  Diltiazem load and gtt. was ordered.  Neurology was updated.  Neurology evaluated and recommends MRI and EEG.  These were ordered.  At time of signout, imaging studies pending.  Care of patient was signed out to oncoming ED provider. I ordered medication including Ativan and Keppra for seizure prophylaxis; IV fluids for hydration; diltiazem for rate control  Reevaluation of the patient after these medicines showed that the patient improved I have reviewed the patients home medicines and have made adjustments as needed   Consultations Obtained:  I requested consultation with the neurologist, Dr. Cleone Dad,  and discussed lab and imaging findings as well as pertinent plan - they recommend: Initiation of Keppra, 250 mg twice daily.  Subsequently recommended EEG, MRI.   Social Determinants of Health:  Has dementia and resides in nursing facility.  CRITICAL CARE Performed by: Iva Mariner   Total critical care time: 32 minutes  Critical care time was exclusive of separately billable procedures and treating other patients.  Critical care was necessary to treat or prevent imminent or life-threatening deterioration.  Critical care was time spent personally by me on the following activities: development of treatment plan with patient and/or surrogate as well as nursing, discussions with consultants, evaluation of patient's response to treatment, examination of patient, obtaining history from patient or surrogate, ordering and performing treatments and interventions, ordering and review of laboratory studies, ordering and review of radiographic studies, pulse oximetry and re-evaluation of patient's condition.         Final Clinical Impression(s) / ED Diagnoses Final diagnoses:  Seizure-like activity (HCC)  Atrial fibrillation with RVR [facility])    Rx / DC Orders ED Discharge Orders     None         Iva Mariner, MD 11/15/23 1827

## 2023-11-15 NOTE — ED Notes (Signed)
Patient transported to MRI with primary RN 

## 2023-11-15 NOTE — Progress Notes (Addendum)
 Discussed briefly with Dr. Kermit Ped this is an 89 year old patient with a history of dementia and significant cerebral atrophy, had a witnessed seizure today with a right gaze deviation.  Due to the focality of the event she has greater than 80% risk of recurrence and therefore merits treatment with antiseizure medication.  In this setting inpatient EEG would not change management if the patient is back to baseline.  Head CT personally reviewed, no acute intracranial process on my review although radiology report is pending.  Certainly the amount of atrophy she has does put her at risk of development of seizures  If she is back to baseline, outpatient follow-up is reasonable, do recommend initiation of Keppra to 50 mg twice daily given her creatinine clearance of 33   Recommendations: - Keppra 250 mg BID - Assessment for any infectious processes/metabolic processes lowering her seizure threshold per ED - Please reach out to neurology if patient is not returning to her baseline mental status, if she has further seizures, or if other questions or concerns arise and full inpatient neurological consultation is felt to be needed  12 minutes spent in care of patient, majority in discussion with Dr. Kermit Ped   These are curbside recommendations based upon the information readily available in the chart on brief review as well as history and examination information provided to me by requesting provider and do not replace a full detailed consult

## 2023-11-15 NOTE — ED Notes (Signed)
 CT and MRI were called and notified that PT has a 2nd line for scans.

## 2023-11-15 NOTE — Progress Notes (Signed)
 Left lower ext venous  has been completed. Refer to [facility] under chart review to view preliminary results.   11/15/2023  5:11 PM Sherae Santino, Hollace Lund

## 2023-11-15 NOTE — Consult Note (Signed)
 NEUROLOGY CONSULT NOTE   Date of service: Nov 15, 2023 Patient Name: Brenda Hammond MRN:  [id number] DOB:  May 07, 1933 Chief Complaint: "witnessed seizure" Requesting Provider: Iva Mariner, MD  History of Present Illness  Brenda Hammond is a 89 y.o. female with a past medical history significant for advanced dementia, paroxysmal atrial fibrillation not on anticoagulation secondary to frequent falls, hypertension,  Per chart notes she was sitting at a table when she developed right gaze and tremors concerning for seizure lasting approximately 1 minute. Initially non-responsive. But the time EMS arrived coming around, with vitals reported as heart rates of 50s, 100% on room air, blood pressure 119/57, CBG 145.  She initially seemed at her baseline to ED provider but subsequently received Ativan to facilitate imaging and was also found to be in A-fib with RVR.  In this setting she became more confused and neurology was asked to formally consult  She was last admitted 10/28/2023 with COVID-19, which also resulted in rhabdomyolysis and some encephalopathy, as well as paroxysmal atrial fibrillation  Additional history obtained from daughter:  Has had prior spells with eyes just rolling back in her head for a few years -- with falls, maybe 3x in the last 2 years. But never with right gaze initially   Incontinent overnight only in the last few weeks at least (since last hospitalization for sure), and has been more quiet -- before used to be more talkative, repeats herself a lot. Performs better with family than for PT/OT  Falls once every 4 or 5 months, uses a cane and ambulates pretty independently  Spell # 1 - Semiology: Right eye gaze deviation, then GTC  - Prodome: unclear - Post-spell: poor responsiveness confusion  - Triggers: unclear  - Frequency: first spell with right gaze deviation   Spell # 2 - Semiology: Eyes rolling back in the head, possible loss of consciousness,  1 year ago - Prodome: unclear - Post-spell: poor responsiveness for up to 3 min - Triggers: unclear  - Frequency: maybe 3x in the last 2 years    Risk factors:  Birth and development - preemie, but normal development Febrile seizures in childhood (no) Significant head trauma (no) Intracranial surgeries (no) Mengingitis/Encephalitis history: no (just polio, resolved) Family history of seizures or developmental delay (no)   LKW: Approximately 1 PM 5/20 Modified rankin score: 4-Needs assistance to walk and tend to bodily needs IV Thrombolysis: No, appears essentially at baseline at time of my examination as confirmed with daughter via phone EVT: No, exam not consistent with LVO, baseline MRS  NIHSS components Score: Comment  1a Level of Conscious 0[]  1[x]  2[]  3[]      1b LOC Questions 0[]  1[]  2[x]       1c LOC Commands 0[x]  1[]  2[]       2 Best Gaze 0[x]  1[]  2[]       3 Visual 0[x]  1[]  2[]  3[]      4 Facial Palsy 0[x]  1[]  2[]  3[]      5a Motor Arm - left 0[]  1[x]  2[]  3[]  4[]  UN[]    5b Motor Arm - Right 0[]  1[x]  2[]  3[]  4[]  UN[]    6a Motor Leg - Left 0[]  1[]  2[x]  3[]  4[]  UN[]    6b Motor Leg - Right 0[]  1[]  2[x]  3[]  4[]  UN[]    7 Limb Ataxia 0[]  1[]  2[]  UN[x]      8 Sensory 0[x]  1[]  2[]  UN[]      9 Best Language 0[]  1[x]  2[]  3[]      10 Dysarthria 0[]  1[x]  2[]   UN[]      11 Extinct. and Inattention 0[x]  1[]  2[]       TOTAL:       ROS  Limited by mental status, obtained from chart and daughter as above  Past History   Past Medical History:  Diagnosis Date   Anemia    Anxiety    Atrial fibrillation (HCC)    Colon adenocarcinoma (HCC)    DJD (degenerative joint disease)    Fibromyalgia    GERD (gastroesophageal reflux disease)    History of poliomyelitis    History of UTI    Hyperlipemia    Memory disorder 03/01/2019   Other drug allergy(995.27)    Scoliosis    Surgical or other procedure not carried out because of patient's decision    Venous insufficiency     Past Surgical  History:  Procedure Laterality Date   APPENDECTOMY     LOW ANTERIOR BOWEL RESECTION  06/2009   with anastomosis for colon cancer   OTHER SURGICAL HISTORY     T & A   TOTAL ABDOMINAL HYSTERECTOMY W/ BILATERAL SALPINGOOPHORECTOMY  1980s    Family History: Family History  Problem Relation Age of Onset   Stroke Father    Cancer Mother        brain   Colon cancer Neg Hx     Social History  reports that she has never smoked. She has never used smokeless tobacco. She reports that she does not drink alcohol and does not use drugs.  Allergies  Allergen Reactions   Sulfa Antibiotics Anaphylaxis, Hypertension and Other (See Comments)    Elevated HR and BP, also and "ALLERGIC," per MAR  REACTION: elevated HR and BP   Alendronate  Other (See Comments)    "twitching" and "ALLERGIC," per [facility]   Aspirin Other (See Comments)    Made the ears ring and "ALLERGIC," per MAR   Cyclobenzaprine Hcl Other (See Comments)    Nightmares- "ALLERGIC," per [facility]   Metaxalone Other (See Comments)    Nightmares- "ALLERGIC," per MAR   Nitrofurantoin Other (See Comments) and Hypertension    Elevated B/P and chest pain- "ALLERGIC," per [facility]   Aricept  [Donepezil ] Rash and Other (See Comments)    Dizziness and cramping also- "ALLERGIC," per MAR   Nortriptyline Hcl Rash and Other (See Comments)    "ALLERGIC," per MAR   Nsaids Rash and Other (See Comments)    "ALLERGIC," per MAR   Penicillins Rash and Other (See Comments)    High fever, too- "ALLERGIC," per MAR    Medications   Current Facility-Administered Medications:    diltiazem (CARDIZEM) 1 mg/mL load via infusion 10 mg, 10 mg, Intravenous, Once **AND** diltiazem (CARDIZEM) 125 mg in dextrose 5% 125 mL (1 mg/mL) infusion, 5-15 mg/hr, Intravenous, Continuous, Dixon, Ryan, MD, Last Rate: 5 mL/hr at 11/15/23 1657, 5 mg/hr at 11/15/23 1657  Current Outpatient Medications:    Cholecalciferol (VITAMIN D3) 50 MCG (2000 UT) TABS, Take 1 tablet by mouth daily.,  Disp: , Rfl:    cyanocobalamin  1000 MCG tablet, Take 1 tablet (1,000 mcg total) by mouth daily., Disp: 60 tablet, Rfl: 0   lidocaine (XYLOCAINE) 2 % solution, Use as directed 15 mLs in the mouth or throat every 6 (six) hours as needed for mouth pain., Disp: , Rfl:    metoprolol  succinate (TOPROL  XL) 50 MG 24 hr tablet, Take 1 tablet (50 mg total) by mouth daily., Disp: 30 tablet, Rfl: 11   PSYLLIUM PO, Take 0.4 g by mouth at  bedtime., Disp: , Rfl:    bisacodyl (DULCOLAX) 5 MG EC tablet, Take 5 mg by mouth daily as needed for moderate constipation., Disp: , Rfl:    melatonin 3 MG TABS tablet, Take 1 tablet (3 mg total) by mouth at bedtime as needed (insomnia)., Disp: 30 tablet, Rfl: 0   TYLENOL  8 HOUR ARTHRITIS PAIN 650 MG CR tablet, Take 650 mg by mouth every 12 (twelve) hours as needed for pain., Disp: , Rfl:   Vitals   Vitals:   11/30/2023 1353 2023-11-30 1353 2023/11/30 1400 November 30, 2023 1403  BP:  (!) 122/56 (!) 111/52   Pulse:  (!) 54 (!) 57   Resp: 14 16 18    Temp:  (!) 96 F (35.6 C)    TempSrc:  Axillary    SpO2:  100% 97%   Weight:    53.6 kg  Height:    5\' 1"  (1.549 m)    Body mass index is 22.33 kg/m.  Physical Exam   Constitutional: Appears frail, elderly Psych: Affect mildly anxious, redirectable Eyes: No scleral injection.  HENT: No OP obstruction.  Head: Normocephalic.  Cardiovascular: Tachycardic on monitor Respiratory: Effort normal, non-labored breathing.  GI: Soft.  No distension. There is no tenderness.  Skin: Mild left lower extremity swelling   Neurologic Examination   Mental Status: Patient is slightly drowsy, oriented to self "Brenda Hammond" only.  Follows commands intermittently very poor attention/concentration that highly limits examination.  Most of her speech is perseverating "Lord, Jesus" Cranial Nerves: II: Visual Fields are full to orienting to stimuli in all quadrants. Pupils are equal, round, and reactive to light.   III,IV, VI, VIII: EOMI to  orienting to examiner's voice V: Facial sensation is symmetric to light eyelash brush VII: Facial movement is symmetric.  VIII: hearing is intact to voice Motor: Mild paratonia.  Slight drift in bilateral upper extremities.  Moves bilateral lower extremities but will not maintain them antigravity.  Confrontational strength testing limited by mental status Sensory: Equally active to touch in all 4 extremities Cerebellar: Finger-nose intact bilaterally, does not perform heel-to-shin Gait:  Deferred in acute setting for patient safety   Labs/Imaging/Neurodiagnostic studies   CBC:  Recent Labs  Lab 2023-11-30 1359  WBC 6.8  NEUTROABS 4.9  HGB 13.2  HCT 41.0  MCV 89.3  PLT 201   Basic Metabolic Panel:  Lab Results  Component Value Date   NA 137 11/30/23   K 3.8 11/30/2023   CO2 27 11/30/2023   GLUCOSE 115 (H) 11/30/23   BUN 10 November 30, 2023   CREATININE 0.89 11-30-23   CALCIUM  8.7 (L) 2023-11-30   GFRNONAA >60 11-30-2023   GFRAA 77 01/05/2019   Lipid Panel:  Lab Results  Component Value Date   LDLCALC 117 (H) 04/17/2020   HgbA1c:  Lab Results  Component Value Date   HGBA1C 6.3 06/04/2021   INR  Lab Results  Component Value Date   INR 1.1 07/14/2022   CT Head without contrast(Personally reviewed): 1. No evidence of acute intracranial abnormality. 2. Moderate chronic small vessel ischemic disease and advanced cerebral atrophy.    ASSESSMENT   Brenda Hammond is a 89 y.o. female with a past medical history significant for dementia, atrial fibrillation not on anticoagulation due to risk/benefit assessment earlier, hypertension, hyperlipidemia, remote polio without significant residual  Impression - Focal seizure based on history  - Recurrent Afib, at risk for stroke due to not on anticoagulation  - Baseline dementia, currently at her baseline  RECOMMENDATIONS  -Keppra 250 mg twice daily -MRI brain w/o contrast  -Routine EEG -Hold off on  anticoagulation until MRI brain completed, then reassess risk/benefit of anticoagulation given Afib appears to be a more recurrent / frequent problem for her now -Neurology will follow along ______________________________________________________________________   Baldwin Levee MD-PhD [facility] [phone] Available 7 AM to 7 PM, outside these hours please contact Neurologist on call listed on AMION

## 2023-11-15 NOTE — ED Notes (Signed)
 Patient transported to CT

## 2023-11-16 ENCOUNTER — Inpatient Hospital Stay (HOSPITAL_COMMUNITY)

## 2023-11-16 DIAGNOSIS — I6389 Other cerebral infarction: Secondary | ICD-10-CM

## 2023-11-16 DIAGNOSIS — I739 Peripheral vascular disease, unspecified: Secondary | ICD-10-CM

## 2023-11-16 DIAGNOSIS — E785 Hyperlipidemia, unspecified: Secondary | ICD-10-CM

## 2023-11-16 DIAGNOSIS — I10 Essential (primary) hypertension: Secondary | ICD-10-CM

## 2023-11-16 DIAGNOSIS — I480 Paroxysmal atrial fibrillation: Secondary | ICD-10-CM

## 2023-11-16 DIAGNOSIS — R29703 NIHSS score 3: Secondary | ICD-10-CM

## 2023-11-16 DIAGNOSIS — I69391 Dysphagia following cerebral infarction: Secondary | ICD-10-CM | POA: Diagnosis not present

## 2023-11-16 DIAGNOSIS — I639 Cerebral infarction, unspecified: Secondary | ICD-10-CM

## 2023-11-16 DIAGNOSIS — R569 Unspecified convulsions: Secondary | ICD-10-CM | POA: Diagnosis not present

## 2023-11-16 LAB — PROCALCITONIN: Procalcitonin: <0.1 ng/mL

## 2023-11-16 LAB — COMPREHENSIVE METABOLIC PANEL WITH GFR
ALT: 15 U/L (ref 0–44)
AST: 25 U/L (ref 15–41)
Albumin: 2.7 g/dL — ABNORMAL LOW (ref 3.5–5.0)
Alkaline Phosphatase: 77 U/L (ref 38–126)
Anion gap: 9 (ref 5–15)
BUN: 11 mg/dL (ref 8–23)
CO2: 23 mmol/L (ref 22–32)
Calcium: 8.1 mg/dL — ABNORMAL LOW (ref 8.9–10.3)
Chloride: 104 mmol/L (ref 98–111)
Creatinine, Ser: 0.82 mg/dL (ref 0.44–1.00)
GFR, Estimated: >60 mL/min (ref >60)
Glucose, Bld: 82 mg/dL (ref 70–99)
Potassium: 3.7 mmol/L (ref 3.5–5.1)
Sodium: 136 mmol/L (ref 135–145)
Total Bilirubin: 0.3 mg/dL (ref 0.0–1.2)
Total Protein: 5.9 g/dL — ABNORMAL LOW (ref 6.5–8.1)

## 2023-11-16 LAB — HEMOGLOBIN A1C
Hgb A1c MFr Bld: 5.4 % (ref 4.8–5.6)
Mean Plasma Glucose: 108.28 mg/dL

## 2023-11-16 LAB — CBC
HCT: 37.6 % (ref 36.0–46.0)
Hemoglobin: 12.2 g/dL (ref 12.0–15.0)
MCH: 29.2 pg (ref 26.0–34.0)
MCHC: 32.4 g/dL (ref 30.0–36.0)
MCV: 90 fL (ref 80.0–100.0)
Platelets: 197 10*3/uL (ref 150–400)
RBC: 4.18 MIL/uL (ref 3.87–5.11)
RDW: 13.5 % (ref 11.5–15.5)
WBC: 5.2 10*3/uL (ref 4.0–10.5)
nRBC: 0 % (ref 0.0–0.2)

## 2023-11-16 LAB — MAGNESIUM: Magnesium: 1.8 mg/dL (ref 1.7–2.4)

## 2023-11-16 LAB — PREALBUMIN: Prealbumin: 11 mg/dL — ABNORMAL LOW (ref 18–38)

## 2023-11-16 LAB — ECHOCARDIOGRAM COMPLETE
AR max vel: 1.72 cm2
AV Area VTI: 1.84 cm2
AV Area mean vel: 1.64 cm2
AV Mean grad: 3 mmHg
AV Peak grad: 6.8 mmHg
Ao pk vel: 1.3 m/s
Area-P 1/2: 3.08 cm2
Height: 61 in
S' Lateral: 2.8 cm
Weight: 1890.66 [oz_av]

## 2023-11-16 LAB — PHOSPHORUS: Phosphorus: 3.8 mg/dL (ref 2.5–4.6)

## 2023-11-16 LAB — HEPARIN LEVEL (UNFRACTIONATED)
Heparin Unfractionated: 0.29 [IU]/mL — ABNORMAL LOW (ref 0.30–0.70)
Heparin Unfractionated: 0.56 [IU]/mL (ref 0.30–0.70)

## 2023-11-16 LAB — LIPID PANEL
Cholesterol: 149 mg/dL (ref 0–200)
HDL: 37 mg/dL — ABNORMAL LOW (ref >40)
LDL Cholesterol: 96 mg/dL (ref 0–99)
Total CHOL/HDL Ratio: 4 ratio
Triglycerides: 82 mg/dL (ref <150)
VLDL: 16 mg/dL (ref 0–40)

## 2023-11-16 MED ORDER — SODIUM CHLORIDE 0.9 % IV SOLN: INTRAVENOUS | Status: DC

## 2023-11-16 MED ORDER — CLOPIDOGREL BISULFATE 75 MG PO TABS
75.0000 mg | ORAL_TABLET | Freq: Every day | ORAL | Status: DC
  Administered 2023-11-17 – 2023-11-18 (×2): 75 mg via ORAL
  Filled 2023-11-16 (×2): qty 1

## 2023-11-16 MED ORDER — LEVETIRACETAM (KEPPRA) 500 MG/5 ML ADULT IV PUSH: 250.0000 mg | Freq: Two times a day (BID) | INTRAVENOUS | Status: DC

## 2023-11-16 MED ORDER — SODIUM CHLORIDE 0.9% FLUSH: 10.0000 mL | INTRAVENOUS | Status: DC | PRN

## 2023-11-16 MED ORDER — STROKE: EARLY STAGES OF RECOVERY BOOK
Freq: Once | Status: AC
  Filled 2023-11-16: qty 1

## 2023-11-16 MED ORDER — LEVETIRACETAM (KEPPRA) 500 MG/5 ML ADULT IV PUSH
500.0000 mg | Freq: Two times a day (BID) | INTRAVENOUS | Status: DC
  Administered 2023-11-16: 500 mg via INTRAVENOUS
  Filled 2023-11-16: qty 5

## 2023-11-16 MED ORDER — LACTATED RINGERS IV BOLUS
500.0000 mL | INTRAVENOUS | Status: AC
  Administered 2023-11-16: 500 mL via INTRAVENOUS

## 2023-11-16 MED ORDER — METOPROLOL SUCCINATE ER 50 MG PO TB24
50.0000 mg | ORAL_TABLET | Freq: Every day | ORAL | Status: DC
  Administered 2023-11-17 – 2023-11-18 (×2): 50 mg via ORAL
  Filled 2023-11-16 (×2): qty 1

## 2023-11-16 MED ORDER — LEVETIRACETAM (KEPPRA) 500 MG/5 ML ADULT IV PUSH
250.0000 mg | Freq: Two times a day (BID) | INTRAVENOUS | Status: DC
  Administered 2023-11-16: 250 mg via INTRAVENOUS
  Filled 2023-11-16 (×2): qty 5

## 2023-11-16 MED ORDER — POLYETHYLENE GLYCOL 3350 17 G PO PACK
17.0000 g | PACK | Freq: Every day | ORAL | Status: DC | PRN
Start: 2023-11-16 — End: 2023-11-18

## 2023-11-16 MED ORDER — LEVETIRACETAM 250 MG PO TABS
250.0000 mg | ORAL_TABLET | Freq: Two times a day (BID) | ORAL | Status: DC
  Administered 2023-11-16 – 2023-11-18 (×4): 250 mg via ORAL
  Filled 2023-11-16 (×4): qty 1

## 2023-11-16 MED ORDER — SODIUM CHLORIDE 0.9 % IV BOLUS
250.0000 mL | INTRAVENOUS | Status: AC
  Administered 2023-11-16: 250 mL via INTRAVENOUS

## 2023-11-16 MED ORDER — ASPIRIN 81 MG PO CHEW
81.0000 mg | CHEWABLE_TABLET | Freq: Every day | ORAL | Status: DC
  Administered 2023-11-17 – 2023-11-18 (×2): 81 mg via ORAL
  Filled 2023-11-16 (×2): qty 1

## 2023-11-16 MED ORDER — MELATONIN 3 MG PO TABS
3.0000 mg | ORAL_TABLET | Freq: Every evening | ORAL | Status: DC | PRN
  Administered 2023-11-17: 3 mg via ORAL
  Filled 2023-11-16: qty 1

## 2023-11-16 MED ORDER — ENSURE ENLIVE PO LIQD
237.0000 mL | Freq: Three times a day (TID) | ORAL | Status: DC
  Administered 2023-11-16 – 2023-11-18 (×5): 237 mL via ORAL
  Filled 2023-11-16: qty 237

## 2023-11-16 MED ORDER — HEPARIN (PORCINE) 25000 UT/250ML-% IV SOLN
600.0000 [IU]/h | INTRAVENOUS | Status: DC
  Administered 2023-11-16: 650 [IU]/h via INTRAVENOUS
  Filled 2023-11-16: qty 250

## 2023-11-16 MED ORDER — ALBUMIN HUMAN 25 % IV SOLN
25.0000 g | INTRAVENOUS | Status: AC
  Administered 2023-11-16: 25 g via INTRAVENOUS
  Filled 2023-11-16: qty 100

## 2023-11-16 MED ORDER — ROSUVASTATIN CALCIUM 20 MG PO TABS
20.0000 mg | ORAL_TABLET | Freq: Every day | ORAL | Status: DC
  Administered 2023-11-17 – 2023-11-18 (×2): 20 mg via ORAL
  Filled 2023-11-16 (×2): qty 1

## 2023-11-16 MED ORDER — LEVETIRACETAM 250 MG PO TABS: 250.0000 mg | ORAL_TABLET | Freq: Two times a day (BID) | ORAL | Status: DC

## 2023-11-16 MED ORDER — PROCHLORPERAZINE EDISYLATE 10 MG/2ML IJ SOLN: 5.0000 mg | Freq: Four times a day (QID) | INTRAMUSCULAR | Status: DC | PRN

## 2023-11-16 MED ORDER — SODIUM CHLORIDE 0.9 % IV BOLUS
500.0000 mL | INTRAVENOUS | Status: AC
  Administered 2023-11-16: 500 mL via INTRAVENOUS

## 2023-11-16 MED ORDER — ENOXAPARIN SODIUM 40 MG/0.4ML IJ SOSY: 40.0000 mg | PREFILLED_SYRINGE | INTRAMUSCULAR | Status: DC

## 2023-11-16 MED ORDER — ACETAMINOPHEN 325 MG PO TABS: 650.0000 mg | ORAL_TABLET | Freq: Four times a day (QID) | ORAL | Status: DC | PRN

## 2023-11-16 MED ORDER — SODIUM CHLORIDE 0.9% FLUSH
10.0000 mL | Freq: Two times a day (BID) | INTRAVENOUS | Status: DC
  Administered 2023-11-16 – 2023-11-18 (×4): 10 mL

## 2023-11-16 NOTE — ED Notes (Signed)
 This RN found albumin 25G to not be infusing. 25G Albumin restarted by this RN and infusing.

## 2023-11-16 NOTE — Progress Notes (Signed)
 SLP Cancellation Note  Patient Details Name: Talita Recht MRN: [id number] DOB: 06-Feb-1933   Cancelled treatment:       Reason Eval/Treat Not Completed: Patient at procedure or test/unavailable. SLP will f/u.    Amil Kale, M.A., CCC-SLP Speech Language Pathology, Acute Rehabilitation Services  Secure Chat preferred [phone]  11/16/2023, 1:49 PM

## 2023-11-16 NOTE — Progress Notes (Signed)
 PHARMACY - ANTICOAGULATION CONSULT NOTE  Pharmacy Consult for heparin Indication: atrial fibrillation in setting of subacute CVA  Allergies  Allergen Reactions   Sulfa Antibiotics Anaphylaxis, Hypertension and Other (See Comments)    Elevated HR and BP, also and "ALLERGIC," per MAR  REACTION: elevated HR and BP   Alendronate  Other (See Comments)    "twitching" and "ALLERGIC," per [facility]   Aspirin Other (See Comments)    Made the ears ring and "ALLERGIC," per MAR   Cyclobenzaprine Hcl Other (See Comments)    Nightmares- "ALLERGIC," per [facility]   Metaxalone Other (See Comments)    Nightmares- "ALLERGIC," per MAR   Nitrofurantoin Other (See Comments) and Hypertension    Elevated B/P and chest pain- "ALLERGIC," per MAR   Aricept  [Donepezil ] Rash and Other (See Comments)    Dizziness and cramping also- "ALLERGIC," per MAR   Nortriptyline Hcl Rash and Other (See Comments)    "ALLERGIC," per MAR   Nsaids Rash and Other (See Comments)    "ALLERGIC," per MAR   Penicillins Rash and Other (See Comments)    High fever, too- "ALLERGIC," per [facility]    Patient Measurements: Height: 5\' 1"  (154.9 cm) Weight: 53.6 kg (118 lb 2.7 oz) IBW/kg (Calculated) : 47.8 HEPARIN DW (KG): 53.6  Vital Signs: Temp: 98 F (36.7 C) (05/21 1106) Temp Source: Oral (05/21 1106) BP: 131/54 (05/21 1000) Pulse Rate: 61 (05/21 1000)  Labs: Recent Labs    11/15/23 1359 11/16/23 0556 11/16/23 1153  HGB 13.2 12.2  --   HCT 41.0 37.6  --   PLT 201 197  --   HEPARINUNFRC  --   --  0.29*  CREATININE 0.89 0.82  --     Estimated Creatinine Clearance: 35.8 mL/min (by C-G formula based on SCr of 0.82 mg/dL).   Medical History: Past Medical History:  Diagnosis Date   Anemia    Anxiety    Atrial fibrillation (HCC)    Colon adenocarcinoma (HCC)    DJD (degenerative joint disease)    Fibromyalgia    GERD (gastroesophageal reflux disease)    History of poliomyelitis    History of UTI    Hyperlipemia    Memory  disorder 03/01/2019   Other drug allergy(995.27)    Scoliosis    Surgical or other procedure not carried out because of patient's decision    Venous insufficiency    Assessment: 89yo female admitted with sz activity, known PAF and found to be in Afib with RVR on arrival, MRI reveals subacute CVA; pt is not on [facility] PTA d/t frequent falls but to begin heparin during admission.  This afternoon, heparin level slightly subtherapeutic at 0.29 with rate at 650 units/hour. CBC okay - hgb 12.2 and plts 197. No issues noted.   Goal of Therapy:  Heparin level 0.3-0.5 units/ml Monitor platelets by anticoagulation protocol: Yes   Plan:  Increase heparin infusion to 850 units/hr; will maintain low goal without boluses for now Recheck heparin level in 8 hours Monitor heparin levels and CBC daily F/u if patient is a good candidate for PO anticoagulation   Wilkins Elpers, PharmD PGY1 Pharmacy Resident 11/16/2023 12:47 PM

## 2023-11-16 NOTE — ED Notes (Signed)
 Per MD, keppra push is adequate. Oral not given due to patient being drowsy. Speech to eval.

## 2023-11-16 NOTE — ED Notes (Signed)
 Heparin level to be collected by phlebotomy

## 2023-11-16 NOTE — ED Notes (Signed)
 Neuro at bedside.

## 2023-11-16 NOTE — Progress Notes (Addendum)
 STROKE TEAM PROGRESS NOTE   INTERIM HISTORY/SUBJECTIVE  No family at the bedside.  Patient is on heparin drip for her A-fib however was not on anticoagulation at home, and does not appear to be a good candidate for long-term anticoagulation given her baseline significant dementia.  With patient not being a good candidate for anticoagulation we would recommend aspirin 81 mg and Plavix 75 mg for 3 months then Plavix alone.  Will recommend palliative care consult for goals of care questions with the family  EEG was obtained overnight with continuous generalized slowing, no seizures identified CBC    Component Value Date/Time   WBC 5.2 11/16/2023 0556   RBC 4.18 11/16/2023 0556   HGB 12.2 11/16/2023 0556   HCT 37.6 11/16/2023 0556   PLT 197 11/16/2023 0556   MCV 90.0 11/16/2023 0556   MCH 29.2 11/16/2023 0556   MCHC 32.4 11/16/2023 0556   RDW 13.5 11/16/2023 0556   LYMPHSABS 1.0 11/15/2023 1359   MONOABS 0.7 11/15/2023 1359   EOSABS 0.1 11/15/2023 1359   BASOSABS 0.0 11/15/2023 1359    BMET    Component Value Date/Time   NA 136 11/16/2023 0556   NA 141 01/05/2019 1445   K 3.7 11/16/2023 0556   CL 104 11/16/2023 0556   CO2 23 11/16/2023 0556   GLUCOSE 82 11/16/2023 0556   BUN 11 11/16/2023 0556   BUN 16 01/05/2019 1445   CREATININE 0.82 11/16/2023 0556   CREATININE 0.91 (H) 07/25/2020 1631   CALCIUM  8.1 (L) 11/16/2023 0556   GFRNONAA >60 11/16/2023 0556   GFRNONAA 71 11/26/2015 1052    IMAGING past 24 hours EEG adult Result Date: 11/16/2023 Arleene Lack, MD     11/16/2023 11:21 AM Patient Name: Brenda Hammond MRN: [id number] Epilepsy Attending: Arleene Lack Referring Physician/Provider: Iva Mariner, MD Date: 11/16/2023 Duration: 26.03 mins Patient history: 89yo F. Was sitting at a table when she developed right gaze and tremors concerning for seizure lasting approximately 1 minute. EEG to evaluate for seizure Level of alertness: Awake AEDs during EEG study: LEV  Technical aspects: This EEG study was done with scalp electrodes positioned according to the 10-20 International system of electrode placement. Electrical activity was reviewed with band pass filter of 1-70Hz , sensitivity of 7 uV/mm, display speed of 44mm/sec with a 60Hz  notched filter applied as appropriate. EEG data were recorded continuously and digitally stored.  Video monitoring was available and reviewed as appropriate. Description: The posterior dominant rhythm consists of 7 Hz activity of moderate voltage (25-35 uV) seen predominantly in posterior head regions, symmetric and reactive to eye opening and eye closing. EEG showed continuous generalized 5 to 7 Hz theta slowing admixed with fronto-central 13-15hz  beta activity. Hyperventilation and photic stimulation were not performed.   ABNORMALITY - Continuous slow, generalized IMPRESSION: This study is suggestive of mild to moderate diffuse encephalopathy. No seizures or epileptiform discharges were seen throughout the recording. Arleene Lack   MR Brain W and Wo Contrast Result Date: 11/16/2023 CLINICAL DATA:  Initial evaluation for acute seizure. EXAM: MRI HEAD WITHOUT AND WITH CONTRAST TECHNIQUE: Multiplanar, multiecho pulse sequences of the brain and surrounding structures were obtained without and with intravenous contrast. CONTRAST:  5mL GADAVIST GADOBUTROL 1 MMOL/ML IV SOLN COMPARISON:  CT from earlier the same day. FINDINGS: Brain: Examination markedly degraded by motion artifact. Moderately advanced temporal lobe predominant cerebral atrophy. Patchy and confluent T2/FLAIR hyperintensity involving the periventricular and deep white matter, consistent chronic small vessel ischemic disease, moderate in  nature. Multiple scattered remote small bilateral cerebellar infarcts noted. 1 cm focus of mild diffusion signal abnormality seen involving the subcortical right frontal operculum (series 3, image 33), likely a small focus of evolving subacute  ischemia. No associated hemorrhage or mass effect. No other evidence for acute or subacute ischemia. Gray-white matter differentiation otherwise maintained. No acute or significant chronic intracranial blood products. No mass lesion, midline shift or mass effect. Ventricular prominence related to global parenchymal volume loss without hydrocephalus. No extra-axial fluid collection. Pituitary gland within normal limits. No other intrinsic temporal lobe abnormality. No visible abnormal enhancement. Vascular: Right vertebral artery hypoplastic and not well seen. Major intracranial vascular flow voids are otherwise well maintained. Skull and upper cervical spine: Craniocervical junction normal limits. Bone marrow signal intensity grossly normal. No scalp soft tissue abnormality. Sinuses/Orbits: Prior bilateral ocular lens replacement. Scattered mucosal thickening present throughout the paranasal sinuses, greater on the right. No significant mastoid effusion. Other: None. IMPRESSION: 1. 1 cm focus of mild diffusion signal abnormality involving the subcortical right frontal operculum, likely a small focus of evolving subacute ischemia. No associated hemorrhage or mass effect. 2. No other acute intracranial abnormality. 3. Moderately advanced temporal lobe predominant cerebral atrophy with chronic small vessel ischemic disease, with multiple scattered remote bilateral cerebellar infarcts. Electronically Signed   By: Virgia Griffins M.D.   On: 11/16/2023 00:22   CT ANGIO HEAD NECK W WO CM Result Date: 11/15/2023 CLINICAL DATA:  Initial evaluation for acute stroke/TIA. EXAM: CT ANGIOGRAPHY HEAD AND NECK WITH AND WITHOUT CONTRAST TECHNIQUE: Multidetector CT imaging of the head and neck was performed using the standard protocol during bolus administration of intravenous contrast. Multiplanar CT image reconstructions and MIPs were obtained to evaluate the vascular anatomy. Carotid stenosis measurements (when applicable)  are obtained utilizing NASCET criteria, using the distal internal carotid diameter as the denominator. RADIATION DOSE REDUCTION: This exam was performed according to the departmental dose-optimization program which includes automated exposure control, adjustment of the mA and/or kV according to patient size and/or use of iterative reconstruction technique. CONTRAST:  75mL OMNIPAQUE  IOHEXOL  350 MG/ML SOLN COMPARISON:  CT from earlier the same day. FINDINGS: Visualized aortic arch CTA NECK FINDINGS Aortic arch: Within normal limits for caliber standard 3 vessel morphology. Aortic atherosclerosis. No significant stenosis about the origin the great vessels. Right carotid system: Right common and internal carotid arteries are patent without dissection. Mild atheromatous change about the right carotid bulb without stenosis. Left carotid system: Left common and internal carotid arteries are patent without stenosis or dissection. Vertebral arteries: Both vertebral arteries arise from subclavian arteries. Left vertebral artery dominant. Vertebral arteries patent without stenosis or dissection. Skeleton: No worrisome osseous lesions. Other neck: No other acute finding. Upper chest: Scattered peribronchial thickening within the visualized lungs. Pleuroparenchymal thickening/scarring noted at the posterior left upper lobe. Review of the MIP images confirms the above findings CTA HEAD FINDINGS Anterior circulation: Atheromatous change about the carotid siphons without hemodynamically significant stenosis. A1 segments patent bilaterally. Normal anterior communicating artery complex. Right ACA patent without significant stenosis. Severe distal left 8 3 stenosis noted (series 12, image 34). Atheromatous irregularity about the M1 segments bilaterally with moderate stenosis at the mid left M1 segment, and severe stenosis at the right MCA bifurcation (series 11, image 24). Atheromatous change about the MCA branches with associated  moderate to severe multifocal stenoses. No proximal MCA branch occlusion. Posterior circulation: Dominant left V4 segment widely patent. Right vertebral artery markedly hypoplastic but patent as well. Left PICA  patent at its origin. Right PICA origin not well seen. Basilar patent without stenosis. Superior cerebral arteries patent bilaterally. Both PCAs primarily supplied via the basilar. Atheromatous change about the PCAs with associated moderate to severe bilateral P2 stenoses. PCAs remain patent to their distal aspects. Venous sinuses: Not well assessed due to timing the contrast bolus. Anatomic variants: As above.  No aneurysm. Review of the MIP images confirms the above findings IMPRESSION: 1. Negative CTA for large vessel occlusion or other emergent finding. 2. Intracranial atherosclerotic disease with associated moderate to severe multifocal stenoses about the M1 segments, MCA branches, and bilateral PCAs as above. 3. No hemodynamically significant stenosis within the neck. 4.  Aortic Atherosclerosis (ICD10-I70.0). Electronically Signed   By: Virgia Griffins M.D.   On: 11/15/2023 22:17   CT ABDOMEN PELVIS W CONTRAST Result Date: 11/15/2023 CLINICAL DATA:  Acute abdominal pain EXAM: CT ABDOMEN AND PELVIS WITH CONTRAST TECHNIQUE: Multidetector CT imaging of the abdomen and pelvis was performed using the standard protocol following bolus administration of intravenous contrast. RADIATION DOSE REDUCTION: This exam was performed according to the departmental dose-optimization program which includes automated exposure control, adjustment of the mA and/or kV according to patient size and/or use of iterative reconstruction technique. CONTRAST:  75mL OMNIPAQUE  IOHEXOL  350 MG/ML SOLN COMPARISON:  None Available. FINDINGS: Lower chest: No acute abnormality. Hepatobiliary: No focal liver abnormality is seen. No gallstones, gallbladder wall thickening, or biliary dilatation. Pancreas: Unremarkable. No pancreatic  ductal dilatation or surrounding inflammatory changes. Spleen: Normal in size without focal abnormality. Adrenals/Urinary Tract: Adrenal glands are within normal limits. Kidneys demonstrate a normal enhancement pattern bilaterally. No renal calculi or obstructive changes are seen. The bladder is well distended. Stomach/Bowel: The appendix is within normal limits. No obstructive or inflammatory changes of the colon are seen. Small bowel and stomach are within normal limits. Vascular/Lymphatic: Aortic atherosclerosis. No enlarged abdominal or pelvic lymph nodes. Reproductive: Status post hysterectomy. No adnexal masses. Other: No abdominal wall hernia or abnormality. No abdominopelvic ascites. Musculoskeletal: No acute or significant osseous findings. IMPRESSION: No acute abnormality is identified within the abdomen correspond with the given clinical history. Electronically Signed   By: Violeta Grey M.D.   On: 11/15/2023 21:24   VAS US  LOWER EXTREMITY VENOUS (DVT) (ONLY [facility] & [facility]) Result Date: 11/15/2023  Lower Venous DVT Study Patient Name:  ARYEL EDELEN [facility]  Date of Exam:   11/15/2023 Medical Rec #: [id number]                Accession #:    [id number] Date of Birth: [birth year]                Patient Gender: F Patient Age:   68 years Exam Location:  [facility] Procedure:      VAS US  LOWER EXTREMITY VENOUS (DVT) Referring Phys: Verdie Gladden DIXON --------------------------------------------------------------------------------  Indications: Pain, and Swelling.  Limitations: Patient's position. Comparison Study: No recent priors. Performing Technologist: Franky Ivanoff Sturdivant-Jones RDMS, RVT  Examination Guidelines: A complete evaluation includes B-mode imaging, spectral Doppler, color Doppler, and power Doppler as needed of all accessible portions of each vessel. Bilateral testing is considered an integral part of a complete examination. Limited examinations for reoccurring indications may be performed as noted. The  reflux portion of the exam is performed with the patient in reverse Trendelenburg.  +-----+---------------+---------+-----------+----------+--------------+ RIGHTCompressibilityPhasicitySpontaneityPropertiesThrombus Aging +-----+---------------+---------+-----------+----------+--------------+ CFV  Full           Yes      Yes                                 +-----+---------------+---------+-----------+----------+--------------+  SFJ  Full                                                        +-----+---------------+---------+-----------+----------+--------------+   +---------+---------------+---------+-----------+----------+--------------+ LEFT     CompressibilityPhasicitySpontaneityPropertiesThrombus Aging +---------+---------------+---------+-----------+----------+--------------+ CFV      Full           Yes      Yes                                 +---------+---------------+---------+-----------+----------+--------------+ SFJ      Full                                                        +---------+---------------+---------+-----------+----------+--------------+ FV Prox  Full                                                        +---------+---------------+---------+-----------+----------+--------------+ FV Mid   Full                                                        +---------+---------------+---------+-----------+----------+--------------+ FV DistalFull                                                        +---------+---------------+---------+-----------+----------+--------------+ PFV      Full                                                        +---------+---------------+---------+-----------+----------+--------------+ POP      Full           Yes      Yes                                 +---------+---------------+---------+-----------+----------+--------------+ PTV      Full                                                         +---------+---------------+---------+-----------+----------+--------------+ PERO     Full                                                        +---------+---------------+---------+-----------+----------+--------------+  Summary: RIGHT: - No evidence of common femoral vein obstruction.   LEFT: - There is no evidence of deep vein thrombosis in the lower extremity.  - No cystic structure found in the popliteal fossa.  *See table(s) above for measurements and observations. Electronically signed by Genny Kid MD on 11/15/2023 at 7:08:53 PM.    Final    DG Chest Portable 1 View Result Date: 11/15/2023 CLINICAL DATA:  Seizure EXAM: PORTABLE CHEST 1 VIEW COMPARISON:  10/24/2023 FINDINGS: Heart and mediastinal contours are within normal limits. No focal opacities or effusions. No acute bony abnormality. IMPRESSION: No active disease. Electronically Signed   By: Janeece Mechanic M.D.   On: 11/15/2023 17:24   CT HEAD WO CONTRAST Result Date: 11/15/2023 CLINICAL DATA:  Seizure, new-onset, no history of trauma. EXAM: CT HEAD WITHOUT CONTRAST TECHNIQUE: Contiguous axial images were obtained from the base of the skull through the vertex without intravenous contrast. RADIATION DOSE REDUCTION: This exam was performed according to the departmental dose-optimization program which includes automated exposure control, adjustment of the mA and/or kV according to patient size and/or use of iterative reconstruction technique. COMPARISON:  Head CT 10/24/2023 and MRI 04/19/2023 FINDINGS: Brain: There is no evidence of an acute infarct, intracranial hemorrhage, mass, midline shift, or extra-axial fluid collection. Patchy hypodensities in the cerebral white matter bilaterally are unchanged and nonspecific but compatible with moderate chronic small vessel ischemic disease small chronic bilateral cerebellar infarcts are unchanged. Advanced cerebral atrophy is again noted including pronounced bilateral temporal lobe volume  loss. Vascular: Calcified atherosclerosis at the skull base. No hyperdense vessel. Skull: No fracture or suspicious lesion. Sinuses/Orbits: Moderate right frontal and right ethmoid sinus opacification, possibly a combination of mucosal thickening and fluid/secretions. Mild mucosal thickening in the right maxillary sinus. Clear mastoid air cells. Bilateral cataract extraction. Other: None. IMPRESSION: 1. No evidence of acute intracranial abnormality. 2. Moderate chronic small vessel ischemic disease and advanced cerebral atrophy. Electronically Signed   By: Aundra Lee M.D.   On: 11/15/2023 16:19    Vitals:   11/16/23 0900 11/16/23 0925 11/16/23 1000 11/16/23 1106  BP: (!) 103/47 (!) 110/59 (!) 131/54   Pulse: (!) 54 (!) 55 61   Resp: 18  (!) 21   Temp:    98 F (36.7 C)  TempSrc:    Oral  SpO2: 96%  99%   Weight:      Height:         PHYSICAL EXAM General: Chronically ill elderly female Psych:  Mood and affect appropriate for situation CV: Regular rate and rhythm on monitor Respiratory:  Regular, unlabored respirations on room air GI: Abdomen soft and nontender   NEURO:  Mental Status: Patient is asleep awakens to voice and stimulation.  She is confused, not able to answer any orientation questions, can follow simple commands  Cranial Nerves:  II: PERRL. Visual fields full.  III, IV, VI: EOMI. Eyelids elevate symmetrically.  V: Sensation is intact to light touch and symmetrical to face.  VII: Face is symmetrical resting and smiling VIII: hearing intact to voice. IX, X: Palate elevates symmetrically. Phonation is normal.  WU:[id number] shrug 5/5. XII: tongue is midline without fasciculations. Motor: All 4 extremities with paratonia Tone: is normal and bulk is normal Sensation- Intact to light touch bilaterally. Extinction absent to light touch to DSS.   Coordination: FTN intact bilaterally, HKS: no ataxia in BLE.No drift.  Gait- deferred  Most Recent NIH  3   ASSESSMENT/PLAN  Brenda Hammond is  a 89 y.o. female with history of advanced dementia, paroxysmal atrial fibrillation not on anticoagulation secondary to frequent falls, hypertension, admitted for evaluation of possible seizure-like activity with right gaze preference and tremors lasting 1 minute NIH on Admission 11  Subacute ischemic stroke-left frontal Etiology: Cardioembolic in the setting of A-fib not on anticoagulation CT head No acute abnormality. Small vessel disease. Atrophy. CTA head & neck no LVO MRI  1 cm focus of mild diffusion signal abnormality involving the subcortical right frontal operculum, 2D Echo pending LDL 96 HgbA1c 5.4 VTE prophylaxis -heparin IV No antithrombotic prior to admission, now on heparin IV .  Would recommend aspirin 81 mg and Plavix 75 mg daily for 3 months then Plavix alone, unless patient gets placed on DOAC Therapy recommendations:  Pending Disposition: Pending  Concern for seizures Keppra 250 mg twice daily EEG This study is suggestive of mild to moderate diffuse encephalopathy. No seizures or epileptiform discharges were seen throughout the recording.  Seizure precautions  Advanced dementia Last seen by [facility] in 2021  Atrial fibrillation Home Meds: None due to falls and advanced dementia Continue telemetry monitoring Heparin IV.  If patient is not placed on DOAC would recommend aspirin 1 mg and Plavix 75 mg daily for 3 months then Plavix alone  Hypertension Home meds: Toprol  50 mg Stable Blood Pressure Goal: SBP less than 160   Hyperlipidemia Home meds: None LDL 96, goal < 70 Add Crestor 20 mg Continue statin at discharge   Dysphagia Patient has post-stroke dysphagia, SLP consulted    Diet   Diet regular Room service appropriate? Yes; Fluid consistency: Thin   Advance diet as tolerated   Hospital day # 1   Jonette Nestle DNP, ACNPC-AG  [facility]  I have personally obtained history,examined  this patient, reviewed notes, independently viewed imaging studies, participated in medical decision making and plan of care.ROS completed by me personally and pertinent positives fully documented  I have made any additions or clarifications directly to the above note. Agree with note above.  Patient with A-fib not on anticoagulation with baseline dementia presented with increased confusion and developed focal seizures for which she has been started on Keppra.  EEG shows mild diffuse slowing and no further ongoing seizure activity.  MRI scan shows subacute right subinsular cortical embolic infarct.  CT angiogram shows moderate atheromatous changes involving the middle cerebral artery branches bilaterally.  Patient has been started on IV heparin but I am not sure she is a good long-term anticoagulation candidate given significant dementia at baseline due to failure to thrive, frequent falls, syncope/presyncope ,.  No family available at the bedside for discussing this issue.  Recommend consider palliative care consult to establish goals of care. Greater than 50% time during this 50-minute visit was spent in counseling and coordination of care about his embolic stroke and dementia and seizures discussion with patient and care team and answering questions. Ardella Beaver, MD Medical Director [facility] Pager: [phone] 11/16/2023 3:55 PM  To contact Stroke Continuity provider, please refer to [email or web address]. After hours, contact General Neurology

## 2023-11-16 NOTE — Progress Notes (Signed)

## 2023-11-16 NOTE — ED Notes (Signed)
 MD notified of patient's MAP, awaiting new orders.

## 2023-11-16 NOTE — Procedures (Signed)
 Patient Name: Brenda Hammond  MRN: [id number]  Epilepsy Attending: Arleene Lack  Referring Physician/Provider: Iva Mariner, MD  Date: 11/16/2023 Duration: 26.03 mins  Patient history: 89yo F. Was sitting at a table when she developed right gaze and tremors concerning for seizure lasting approximately 1 minute. EEG to evaluate for seizure  Level of alertness: Awake  AEDs during EEG study: LEV  Technical aspects: This EEG study was done with scalp electrodes positioned according to the 10-20 International system of electrode placement. Electrical activity was reviewed with band pass filter of 1-70Hz , sensitivity of 7 uV/mm, display speed of 74mm/sec with a 60Hz  notched filter applied as appropriate. EEG data were recorded continuously and digitally stored.  Video monitoring was available and reviewed as appropriate.  Description: The posterior dominant rhythm consists of 7 Hz activity of moderate voltage (25-35 uV) seen predominantly in posterior head regions, symmetric and reactive to eye opening and eye closing. EEG showed continuous generalized 5 to 7 Hz theta slowing admixed with fronto-central 13-15hz  beta activity. Hyperventilation and photic stimulation were not performed.     ABNORMALITY - Continuous slow, generalized  IMPRESSION: This study is suggestive of mild to moderate diffuse encephalopathy. No seizures or epileptiform discharges were seen throughout the recording.  Brenda Hammond

## 2023-11-16 NOTE — H&P (Addendum)
 History and Physical    Patient: Brenda Hammond WUJ:[id number] DOB: 1933-02-22 DOA: 11/15/2023 DOS: the patient was seen and examined on 11/16/2023 PCP: Brendalyn Calkins, MD  Patient coming from: ALF/ILF  Chief Complaint:  Chief Complaint  Patient presents with   Seizures   HPI: Brenda Hammond is a 89 y.o. female with medical history significant of pAF, HTN, HLD, GERD, recent admission for COVID c/b rhabdomyolysis p/w acute metabolic encephalopathy following minute long episode of R gaze deviation and tremors c/f seizure.  Pt unable to give medical history and family no longer at bedside. From what I can gather from Epic chart review, "pt bib ems from [facility] dementia unit; while NP there for a follow up visit for pt's husband, pt had seizure while sitting at table; reported fixed R gaze with tremors and posturing; seizure lasted approx 1 minute; postictal on ems arrival."   In the ED, pt was bradycardic and hypotensive. Labs notable for CK 2571 (down from 9053 on 4/30), Cr. 0.89, glu 115, K 3.8 and Mg 1.9, WBC 6.8, and Hb 13.2. CTA head/neck negative for large vessel occulusion, but notable for "intracranial atherosclerotic disease with associated moderate to severe multifocal stenoses about the M1 segments, MCA branches, and bilateral PCAs." CT abdpelvis w/o acute abnl. Pt admitted to medicine for ongoing w/u. Neurology following for seizure w/u.  Review of Systems: As mentioned in the history of present illness. All other systems reviewed and are negative. Past Medical History:  Diagnosis Date   Anemia    Anxiety    Atrial fibrillation (HCC)    Colon adenocarcinoma (HCC)    DJD (degenerative joint disease)    Fibromyalgia    GERD (gastroesophageal reflux disease)    History of poliomyelitis    History of UTI    Hyperlipemia    Memory disorder 03/01/2019   Other drug allergy(995.27)    Scoliosis    Surgical or other procedure not carried out because of patient's  decision    Venous insufficiency    Past Surgical History:  Procedure Laterality Date   APPENDECTOMY     LOW ANTERIOR BOWEL RESECTION  06/2009   with anastomosis for colon cancer   OTHER SURGICAL HISTORY     T & A   TOTAL ABDOMINAL HYSTERECTOMY W/ BILATERAL SALPINGOOPHORECTOMY  1980s   Social History:  reports that she has never smoked. She has never used smokeless tobacco. She reports that she does not drink alcohol and does not use drugs.  Allergies  Allergen Reactions   Sulfa Antibiotics Anaphylaxis, Hypertension and Other (See Comments)    Elevated HR and BP, also and "ALLERGIC," per [facility]  REACTION: elevated HR and BP   Alendronate  Other (See Comments)    "twitching" and "ALLERGIC," per [facility]   Aspirin Other (See Comments)    Made the ears ring and "ALLERGIC," per MAR   Cyclobenzaprine Hcl Other (See Comments)    Nightmares- "ALLERGIC," per [facility]   Metaxalone Other (See Comments)    Nightmares- "ALLERGIC," per [facility]   Nitrofurantoin Other (See Comments) and Hypertension    Elevated B/P and chest pain- "ALLERGIC," per [facility]   Aricept  [Donepezil ] Rash and Other (See Comments)    Dizziness and cramping also- "ALLERGIC," per [facility]   Nortriptyline Hcl Rash and Other (See Comments)    "ALLERGIC," per [facility]   Nsaids Rash and Other (See Comments)    "ALLERGIC," per [facility]   Penicillins Rash and Other (See Comments)    High fever, too- "ALLERGIC,"  per [facility]    Family History  Problem Relation Age of Onset   Stroke Father    Cancer Mother        brain   Colon cancer Neg Hx     Prior to Admission medications   Medication Sig Start Date End Date Taking? Authorizing Provider  Cholecalciferol (VITAMIN D3) 50 MCG (2000 UT) TABS Take 1 tablet by mouth daily.   Yes [provider]  cyanocobalamin  1000 MCG tablet Take 1 tablet (1,000 mcg total) by mouth daily. 04/20/23  Yes Adhikari, Amrit, MD  lidocaine (XYLOCAINE) 2 % solution Use as directed 15 mLs in the mouth or throat every 6 (six)  hours as needed for mouth pain.   Yes [provider]  metoprolol  succinate (TOPROL  XL) 50 MG 24 hr tablet Take 1 tablet (50 mg total) by mouth daily. 10/28/23 10/27/24 Yes Danford, Willis Harter, MD  PSYLLIUM PO Take 0.4 g by mouth at bedtime.   Yes [provider]  bisacodyl (DULCOLAX) 5 MG EC tablet Take 5 mg by mouth daily as needed for moderate constipation.    [provider]  melatonin 3 MG TABS tablet Take 1 tablet (3 mg total) by mouth at bedtime as needed (insomnia). 04/19/23   Leona Rake, MD  TYLENOL  8 HOUR ARTHRITIS PAIN 650 MG CR tablet Take 650 mg by mouth every 12 (twelve) hours as needed for pain.    [provider]    Physical Exam: Vitals:   11/16/23 0815 11/16/23 0830 11/16/23 0845 11/16/23 0900  BP: (!) 106/51 (!) 114/44 (!) 113/49 (!) 103/47  Pulse: (!) 56 (!) 51 (!) 55 (!) 54  Resp: 18 (!) 28 17 18   Temp:      TempSrc:      SpO2: 92% 94% 97% 96%  Weight:      Height:       General: Alert, oriented x0, NAD Respiratory: Lungs clear to auscultation bilaterally with normal respiratory effort; no w/r/r Cardiovascular: Regular rate and rhythm w/o m/r/g  Data Reviewed:  Lab Results  Component Value Date   WBC 5.2 11/16/2023   HGB 12.2 11/16/2023   HCT 37.6 11/16/2023   MCV 90.0 11/16/2023   PLT 197 11/16/2023   Lab Results  Component Value Date   GLUCOSE 82 11/16/2023   CALCIUM  8.1 (L) 11/16/2023   NA 136 11/16/2023   K 3.7 11/16/2023   CO2 23 11/16/2023   CL 104 11/16/2023   BUN 11 11/16/2023   CREATININE 0.82 11/16/2023   Lab Results  Component Value Date   ALT 15 11/16/2023   AST 25 11/16/2023   ALKPHOS 77 11/16/2023   BILITOT 0.3 11/16/2023   Lab Results  Component Value Date   INR 1.1 07/14/2022    Radiology: MR Brain W and Wo Contrast Result Date: 11/16/2023 CLINICAL DATA:  Initial evaluation for acute seizure. EXAM: MRI HEAD WITHOUT AND WITH CONTRAST TECHNIQUE: Multiplanar, multiecho pulse sequences  of the brain and surrounding structures were obtained without and with intravenous contrast. CONTRAST:  5mL GADAVIST GADOBUTROL 1 MMOL/ML IV SOLN COMPARISON:  CT from earlier the same day. FINDINGS: Brain: Examination markedly degraded by motion artifact. Moderately advanced temporal lobe predominant cerebral atrophy. Patchy and confluent T2/FLAIR hyperintensity involving the periventricular and deep white matter, consistent chronic small vessel ischemic disease, moderate in nature. Multiple scattered remote small bilateral cerebellar infarcts noted. 1 cm focus of mild diffusion signal abnormality seen involving the subcortical right frontal operculum (series 3, image 33), likely  a small focus of evolving subacute ischemia. No associated hemorrhage or mass effect. No other evidence for acute or subacute ischemia. Gray-white matter differentiation otherwise maintained. No acute or significant chronic intracranial blood products. No mass lesion, midline shift or mass effect. Ventricular prominence related to global parenchymal volume loss without hydrocephalus. No extra-axial fluid collection. Pituitary gland within normal limits. No other intrinsic temporal lobe abnormality. No visible abnormal enhancement. Vascular: Right vertebral artery hypoplastic and not well seen. Major intracranial vascular flow voids are otherwise well maintained. Skull and upper cervical spine: Craniocervical junction normal limits. Bone marrow signal intensity grossly normal. No scalp soft tissue abnormality. Sinuses/Orbits: Prior bilateral ocular lens replacement. Scattered mucosal thickening present throughout the paranasal sinuses, greater on the right. No significant mastoid effusion. Other: None. IMPRESSION: 1. 1 cm focus of mild diffusion signal abnormality involving the subcortical right frontal operculum, likely a small focus of evolving subacute ischemia. No associated hemorrhage or mass effect. 2. No other acute intracranial  abnormality. 3. Moderately advanced temporal lobe predominant cerebral atrophy with chronic small vessel ischemic disease, with multiple scattered remote bilateral cerebellar infarcts. Electronically Signed   By: Virgia Griffins M.D.   On: 11/16/2023 00:22   CT ANGIO HEAD NECK W WO CM Result Date: 11/15/2023 CLINICAL DATA:  Initial evaluation for acute stroke/TIA. EXAM: CT ANGIOGRAPHY HEAD AND NECK WITH AND WITHOUT CONTRAST TECHNIQUE: Multidetector CT imaging of the head and neck was performed using the standard protocol during bolus administration of intravenous contrast. Multiplanar CT image reconstructions and MIPs were obtained to evaluate the vascular anatomy. Carotid stenosis measurements (when applicable) are obtained utilizing NASCET criteria, using the distal internal carotid diameter as the denominator. RADIATION DOSE REDUCTION: This exam was performed according to the departmental dose-optimization program which includes automated exposure control, adjustment of the mA and/or kV according to patient size and/or use of iterative reconstruction technique. CONTRAST:  75mL OMNIPAQUE  IOHEXOL  350 MG/ML SOLN COMPARISON:  CT from earlier the same day. FINDINGS: Visualized aortic arch CTA NECK FINDINGS Aortic arch: Within normal limits for caliber standard 3 vessel morphology. Aortic atherosclerosis. No significant stenosis about the origin the great vessels. Right carotid system: Right common and internal carotid arteries are patent without dissection. Mild atheromatous change about the right carotid bulb without stenosis. Left carotid system: Left common and internal carotid arteries are patent without stenosis or dissection. Vertebral arteries: Both vertebral arteries arise from subclavian arteries. Left vertebral artery dominant. Vertebral arteries patent without stenosis or dissection. Skeleton: No worrisome osseous lesions. Other neck: No other acute finding. Upper chest: Scattered peribronchial  thickening within the visualized lungs. Pleuroparenchymal thickening/scarring noted at the posterior left upper lobe. Review of the MIP images confirms the above findings CTA HEAD FINDINGS Anterior circulation: Atheromatous change about the carotid siphons without hemodynamically significant stenosis. A1 segments patent bilaterally. Normal anterior communicating artery complex. Right ACA patent without significant stenosis. Severe distal left 8 3 stenosis noted (series 12, image 34). Atheromatous irregularity about the M1 segments bilaterally with moderate stenosis at the mid left M1 segment, and severe stenosis at the right MCA bifurcation (series 11, image 24). Atheromatous change about the MCA branches with associated moderate to severe multifocal stenoses. No proximal MCA branch occlusion. Posterior circulation: Dominant left V4 segment widely patent. Right vertebral artery markedly hypoplastic but patent as well. Left PICA patent at its origin. Right PICA origin not well seen. Basilar patent without stenosis. Superior cerebral arteries patent bilaterally. Both PCAs primarily supplied via the basilar. Atheromatous change about the  PCAs with associated moderate to severe bilateral P2 stenoses. PCAs remain patent to their distal aspects. Venous sinuses: Not well assessed due to timing the contrast bolus. Anatomic variants: As above.  No aneurysm. Review of the MIP images confirms the above findings IMPRESSION: 1. Negative CTA for large vessel occlusion or other emergent finding. 2. Intracranial atherosclerotic disease with associated moderate to severe multifocal stenoses about the M1 segments, MCA branches, and bilateral PCAs as above. 3. No hemodynamically significant stenosis within the neck. 4.  Aortic Atherosclerosis (ICD10-I70.0). Electronically Signed   By: Virgia Griffins M.D.   On: 11/15/2023 22:17   CT ABDOMEN PELVIS W CONTRAST Result Date: 11/15/2023 CLINICAL DATA:  Acute abdominal pain EXAM: CT  ABDOMEN AND PELVIS WITH CONTRAST TECHNIQUE: Multidetector CT imaging of the abdomen and pelvis was performed using the standard protocol following bolus administration of intravenous contrast. RADIATION DOSE REDUCTION: This exam was performed according to the departmental dose-optimization program which includes automated exposure control, adjustment of the mA and/or kV according to patient size and/or use of iterative reconstruction technique. CONTRAST:  75mL OMNIPAQUE  IOHEXOL  350 MG/ML SOLN COMPARISON:  None Available. FINDINGS: Lower chest: No acute abnormality. Hepatobiliary: No focal liver abnormality is seen. No gallstones, gallbladder wall thickening, or biliary dilatation. Pancreas: Unremarkable. No pancreatic ductal dilatation or surrounding inflammatory changes. Spleen: Normal in size without focal abnormality. Adrenals/Urinary Tract: Adrenal glands are within normal limits. Kidneys demonstrate a normal enhancement pattern bilaterally. No renal calculi or obstructive changes are seen. The bladder is well distended. Stomach/Bowel: The appendix is within normal limits. No obstructive or inflammatory changes of the colon are seen. Small bowel and stomach are within normal limits. Vascular/Lymphatic: Aortic atherosclerosis. No enlarged abdominal or pelvic lymph nodes. Reproductive: Status post hysterectomy. No adnexal masses. Other: No abdominal wall hernia or abnormality. No abdominopelvic ascites. Musculoskeletal: No acute or significant osseous findings. IMPRESSION: No acute abnormality is identified within the abdomen correspond with the given clinical history. Electronically Signed   By: Violeta Grey M.D.   On: 11/15/2023 21:24   VAS US  LOWER EXTREMITY VENOUS (DVT) (ONLY [facility] & [facility]) Result Date: 11/15/2023  Lower Venous DVT Study Patient Name:  Brenda Hammond [facility]  Date of Exam:   11/15/2023 Medical Rec #: [id number]                Accession #:    [id number] Date of Birth: [birth year]                 Patient Gender: F Patient Age:   22 years Exam Location:  [facility] Procedure:      VAS US  LOWER EXTREMITY VENOUS (DVT) Referring Phys: Verdie Gladden DIXON --------------------------------------------------------------------------------  Indications: Pain, and Swelling.  Limitations: Patient's position. Comparison Study: No recent priors. Performing Technologist: Franky Ivanoff Sturdivant-Jones RDMS, RVT  Examination Guidelines: A complete evaluation includes B-mode imaging, spectral Doppler, color Doppler, and power Doppler as needed of all accessible portions of each vessel. Bilateral testing is considered an integral part of a complete examination. Limited examinations for reoccurring indications may be performed as noted. The reflux portion of the exam is performed with the patient in reverse Trendelenburg.  +-----+---------------+---------+-----------+----------+--------------+ RIGHTCompressibilityPhasicitySpontaneityPropertiesThrombus Aging +-----+---------------+---------+-----------+----------+--------------+ CFV  Full           Yes      Yes                                 +-----+---------------+---------+-----------+----------+--------------+ SFJ  Full                                                        +-----+---------------+---------+-----------+----------+--------------+   +---------+---------------+---------+-----------+----------+--------------+  LEFT     CompressibilityPhasicitySpontaneityPropertiesThrombus Aging +---------+---------------+---------+-----------+----------+--------------+ CFV      Full           Yes      Yes                                 +---------+---------------+---------+-----------+----------+--------------+ SFJ      Full                                                        +---------+---------------+---------+-----------+----------+--------------+ FV Prox  Full                                                         +---------+---------------+---------+-----------+----------+--------------+ FV Mid   Full                                                        +---------+---------------+---------+-----------+----------+--------------+ FV DistalFull                                                        +---------+---------------+---------+-----------+----------+--------------+ PFV      Full                                                        +---------+---------------+---------+-----------+----------+--------------+ POP      Full           Yes      Yes                                 +---------+---------------+---------+-----------+----------+--------------+ PTV      Full                                                        +---------+---------------+---------+-----------+----------+--------------+ PERO     Full                                                        +---------+---------------+---------+-----------+----------+--------------+     Summary: RIGHT: - No evidence of common femoral vein obstruction.   LEFT: - There is no evidence of deep vein thrombosis in the lower extremity.  - No cystic structure found in the popliteal fossa.  *See table(s) above for measurements and  observations. Electronically signed by Genny Kid MD on 11/15/2023 at 7:08:53 PM.    Final    DG Chest Portable 1 View Result Date: 11/15/2023 CLINICAL DATA:  Seizure EXAM: PORTABLE CHEST 1 VIEW COMPARISON:  10/24/2023 FINDINGS: Heart and mediastinal contours are within normal limits. No focal opacities or effusions. No acute bony abnormality. IMPRESSION: No active disease. Electronically Signed   By: Janeece Mechanic M.D.   On: 11/15/2023 17:24   CT HEAD WO CONTRAST Result Date: 11/15/2023 CLINICAL DATA:  Seizure, new-onset, no history of trauma. EXAM: CT HEAD WITHOUT CONTRAST TECHNIQUE: Contiguous axial images were obtained from the base of the skull through the vertex without intravenous contrast.  RADIATION DOSE REDUCTION: This exam was performed according to the departmental dose-optimization program which includes automated exposure control, adjustment of the mA and/or kV according to patient size and/or use of iterative reconstruction technique. COMPARISON:  Head CT 10/24/2023 and MRI 04/19/2023 FINDINGS: Brain: There is no evidence of an acute infarct, intracranial hemorrhage, mass, midline shift, or extra-axial fluid collection. Patchy hypodensities in the cerebral white matter bilaterally are unchanged and nonspecific but compatible with moderate chronic small vessel ischemic disease small chronic bilateral cerebellar infarcts are unchanged. Advanced cerebral atrophy is again noted including pronounced bilateral temporal lobe volume loss. Vascular: Calcified atherosclerosis at the skull base. No hyperdense vessel. Skull: No fracture or suspicious lesion. Sinuses/Orbits: Moderate right frontal and right ethmoid sinus opacification, possibly a combination of mucosal thickening and fluid/secretions. Mild mucosal thickening in the right maxillary sinus. Clear mastoid air cells. Bilateral cataract extraction. Other: None. IMPRESSION: 1. No evidence of acute intracranial abnormality. 2. Moderate chronic small vessel ischemic disease and advanced cerebral atrophy. Electronically Signed   By: Aundra Lee M.D.   On: 11/15/2023 16:19    Assessment and Plan: 22F h/o pAF, HTN, HLD, GERD, recent admission for COVID c/b rhabdomyolysis p/w acute metabolic encephalopathy following minute long episode of R gaze deviation and tremors c/f seizure.  Acute metabolic encephalopathy Presumed seizure Dysphagia Pt admitted with confusion and reported sz (R gaze deviation and tremors for 1 minute w/ elevated CK; of note, prior CK elevation 3 weeks ago on 4/30); pt previously treated for rhabdomyolysis and unclear if prior elevated CK related to sz given similar presentation and nl Cr at that time. -SLP consulted;  apprec eval/recs -PT/OT consulted; apprec eval/recs -Neurology following; recs: MRI head WWO; Keppra 250mg  BID; EEG; r/o infectious and cardiac etiologies -Cards consulted; recs: agree with previous family decision to forego OAC iso dementia/frequent falls; OK to hold pta metoprolol  iso bradycardia -F/u procalcitonin to exclude infectious etiology  Presumed FTT Hypoalbuminemia Dysphagia -SLP consult per above -F/u preablumin  H/o pAF -Cards consult per above -PTA metoprolol  XL 25mg  daily   Advance Care Planning:   Code Status: Limited: Do not attempt resuscitation (DNR) -DNR-LIMITED -Do Not Intubate/DNI    Consults: Neurology and Cardiology  Family Communication: N/A  Severity of Illness: The appropriate patient status for this patient is INPATIENT. Inpatient status is judged to be reasonable and necessary in order to provide the required intensity of service to ensure the patient's safety. The patient's presenting symptoms, physical exam findings, and initial radiographic and laboratory data in the context of their chronic comorbidities is felt to place them at high risk for further clinical deterioration. Furthermore, it is not anticipated that the patient will be medically stable for discharge from the hospital within 2 midnights of admission.   * I certify that at the point of admission it  is my clinical judgment that the patient will require inpatient hospital care spanning beyond 2 midnights from the point of admission due to high intensity of service, high risk for further deterioration and high frequency of surveillance required.*   ------- I spent 55 minutes reviewing previous labs/notes, obtaining separate history at the bedside, counseling/discussing the treatment plan outlined above, ordering medications/tests, and performing clinical documentation.  Author: Arne Langdon, MD 11/16/2023 9:23 AM  For on call review [email or web address].

## 2023-11-16 NOTE — Progress Notes (Signed)
 PHARMACY - ANTICOAGULATION CONSULT NOTE  Pharmacy Consult for heparin Indication: atrial fibrillation in setting of subacute CVA  Allergies  Allergen Reactions   Sulfa Antibiotics Anaphylaxis, Hypertension and Other (See Comments)    Elevated HR and BP, also and "ALLERGIC," per MAR  REACTION: elevated HR and BP   Alendronate  Other (See Comments)    "twitching" and "ALLERGIC," per [facility]   Aspirin Other (See Comments)    Made the ears ring and "ALLERGIC," per MAR   Cyclobenzaprine Hcl Other (See Comments)    Nightmares- "ALLERGIC," per [facility]   Metaxalone Other (See Comments)    Nightmares- "ALLERGIC," per [facility]   Nitrofurantoin Other (See Comments) and Hypertension    Elevated B/P and chest pain- "ALLERGIC," per MAR   Aricept  [Donepezil ] Rash and Other (See Comments)    Dizziness and cramping also- "ALLERGIC," per [facility]   Nortriptyline Hcl Rash and Other (See Comments)    "ALLERGIC," per [facility]   Nsaids Rash and Other (See Comments)    "ALLERGIC," per [facility]   Penicillins Rash and Other (See Comments)    High fever, too- "ALLERGIC," per [facility]    Patient Measurements: Height: 5\' 1"  (154.9 cm) Weight: 53.6 kg (118 lb 2.7 oz) IBW/kg (Calculated) : 47.8 HEPARIN DW (KG): 53.6  Vital Signs: Temp: 97.6 F (36.4 C) (05/21 2016) Temp Source: Axillary (05/21 2016) BP: 144/64 (05/21 2016) Pulse Rate: 67 (05/21 2016)  Labs: Recent Labs    11/15/23 1359 11/16/23 0556 11/16/23 1153 11/16/23 2058  HGB 13.2 12.2  --   --   HCT 41.0 37.6  --   --   PLT 201 197  --   --   HEPARINUNFRC  --   --  0.29* 0.56  CREATININE 0.89 0.82  --   --     Estimated Creatinine Clearance: 35.8 mL/min (by C-G formula based on SCr of 0.82 mg/dL).   Medical History: Past Medical History:  Diagnosis Date   Anemia    Anxiety    Atrial fibrillation (HCC)    Colon adenocarcinoma (HCC)    DJD (degenerative joint disease)    Fibromyalgia    GERD (gastroesophageal reflux disease)    History of poliomyelitis     History of UTI    Hyperlipemia    Memory disorder 03/01/2019   Other drug allergy(995.27)    Scoliosis    Surgical or other procedure not carried out because of patient's decision    Venous insufficiency    Assessment: 89yo female admitted with sz activity, known PAF and found to be in Afib with RVR on arrival, MRI reveals subacute CVA; pt is not on [facility] PTA d/t frequent falls but to begin heparin during admission.  Heparin level came back just above goal tonight. We will reduce slightly and check a level in AM.   Goal of Therapy:  Heparin level 0.3-0.5 units/ml Monitor platelets by anticoagulation protocol: Yes   Plan:  Decrease heparin infusion to 700 units/hr Recheck heparin level in AM Monitor heparin levels and CBC daily F/u if patient is a good candidate for PO anticoagulation   Christianna Belmonte, PharmD, BCIDP, AAHIVP, CPP Infectious Disease Pharmacist 11/16/2023 9:40 PM

## 2023-11-16 NOTE — ED Notes (Signed)
 Phleb en route to collect heparin level.

## 2023-11-16 NOTE — ED Notes (Signed)
 Per phleb, EEG asked for lab to be drawn after EEG is complete. Pharm notified.

## 2023-11-16 NOTE — Evaluation (Signed)
 Clinical/Bedside Swallow Evaluation Patient Details  Name: Brenda Hammond MRN: [id number] Date of Birth: [birth year]  Today's Date: 11/16/2023 Time: SLP Start Time (ACUTE ONLY): 1611 SLP Stop Time (ACUTE ONLY): 1630 SLP Time Calculation (min) (ACUTE ONLY): 19 min  Past Medical History:  Past Medical History:  Diagnosis Date   Anemia    Anxiety    Atrial fibrillation (HCC)    Colon adenocarcinoma (HCC)    DJD (degenerative joint disease)    Fibromyalgia    GERD (gastroesophageal reflux disease)    History of poliomyelitis    History of UTI    Hyperlipemia    Memory disorder 03/01/2019   Other drug allergy(995.27)    Scoliosis    Surgical or other procedure not carried out because of patient's decision    Venous insufficiency    Past Surgical History:  Past Surgical History:  Procedure Laterality Date   APPENDECTOMY     LOW ANTERIOR BOWEL RESECTION  06/2009   with anastomosis for colon cancer   OTHER SURGICAL HISTORY     T & A   TOTAL ABDOMINAL HYSTERECTOMY W/ BILATERAL SALPINGOOPHORECTOMY  1980s   HPI:  Brenda Hammond is an 89 yo female presenting to ED 5/20 from [facility] dementia unit after having a seizure with R gaze deviation. MRI shows mild diffusion signal abnormality involving the subcortical R frontal operculum, likely evolving subacute ischemia. Recently admitted for encephalopathy 2/2 COVID infection. PMH includes A-fib, GERD, arthritis, fibromyalgia, anemia, colon cancer, dementia, HTN    Assessment / Plan / Recommendation  Clinical Impression  Pt was sleeping upon SLP arrival but roused easily. She intermittently followed commands to complete an oral motor exam, which appeared grossly functional. Pt had a strong, congested cough before any trials of POs. With encouragement, pt held the cup to take straw sips of water  and Ensure without signs clinically concerning for aspiration. She had bites of purees and solids with swift oral transit and complete oral  clearance. SLP was unable to reach pt's family members to confirm her baseline. Recommend resuming regular diet with thin liquids. Will f/u at least briefly for ongoing assessment. SLP Visit Diagnosis: Dysphagia, unspecified (R13.10)    Aspiration Risk  Mild aspiration risk    Diet Recommendation Regular;Thin liquid    Liquid Administration via: Cup;Straw Medication Administration: Whole meds with liquid Supervision: Staff to assist with self feeding;Full supervision/cueing for compensatory strategies Compensations: Minimize environmental distractions;Slow rate;Small sips/bites Postural Changes: Seated upright at 90 degrees    Other  Recommendations Oral Care Recommendations: Oral care BID    Recommendations for follow up therapy are one component of a multi-disciplinary discharge planning process, led by the attending physician.  Recommendations may be updated based on patient status, additional functional criteria and insurance authorization.  Follow up Recommendations Home health SLP      Assistance Recommended at Discharge    Functional Status Assessment Patient has had a recent decline in their functional status and demonstrates the ability to make significant improvements in function in a reasonable and predictable amount of time.  Frequency and Duration min 2x/week  1 week       Prognosis Prognosis for improved oropharyngeal function: Good Barriers to Reach Goals: Cognitive deficits      Swallow Study   General HPI: Brenda Hammond is an 89 yo female presenting to ED 5/20 from [facility] dementia unit after having a seizure with R gaze deviation. MRI shows mild diffusion signal abnormality involving the subcortical R frontal operculum, likely  evolving subacute ischemia. Recently admitted for encephalopathy 2/2 COVID infection. PMH includes A-fib, GERD, arthritis, fibromyalgia, anemia, colon cancer, dementia, HTN Type of Study: Bedside Swallow Evaluation Previous Swallow  Assessment: none in chart Diet Prior to this Study: NPO Temperature Spikes Noted: No Respiratory Status: Room air History of Recent Intubation: No Behavior/Cognition: Alert;Cooperative;Requires cueing Oral Cavity Assessment: Within Functional Limits Oral Care Completed by SLP: No Oral Cavity - Dentition: Adequate natural dentition Vision: Functional for self-feeding Self-Feeding Abilities: Able to feed self Patient Positioning: Upright in bed Baseline Vocal Quality: Normal Volitional Cough: Strong Volitional Swallow: Able to elicit    Oral/Motor/Sensory Function Overall Oral Motor/Sensory Function: Within functional limits   Ice Chips Ice chips: Not tested   Thin Liquid Thin Liquid: Within functional limits Presentation: Straw;Self Fed    Nectar Thick Nectar Thick Liquid: Not tested   Honey Thick Honey Thick Liquid: Not tested   Puree Puree: Within functional limits Presentation: Spoon   Solid     Solid: Within functional limits      Amil Kale, M.A., CCC-SLP Speech Language Pathology, Acute Rehabilitation Services  Secure Chat preferred [phone]  11/16/2023,4:56 PM

## 2023-11-16 NOTE — ED Notes (Signed)
 PT at bedside.

## 2023-11-16 NOTE — Consult Note (Addendum)
 Cardiology Consultation   Patient ID: Rockelle Heuerman MRN: [id number]; DOB: Nov 18, 1932  Admit date: 11/15/2023 Date of Consult: 11/16/2023  PCP:  Brendalyn Calkins, MD   [facility] Providers Cardiologist:  Ola Berger, MD   {  Patient Profile:   Krystian Ferrentino is a 89 y.o. female with a hx of paroxsymal atrial fibrillation, hyperlipidemia, history of near syncope/syncope, advanced dementia, anemia, anxiety, hypertension, history of colon cancer, GERD who is being seen 11/16/2023 for the evaluation of atrial fibrillation at the request of Dr. Sulema Endo.  History of Present Illness:   Ms. Booton has past medical history as stated above. She presented to [facility] ED via EMS on 11/15/2023 for reported seizure. She is currently a resident of [facility] memory care unit and staff witnessed the episodes. They reported right-sided gaze, tremors with posturing that last about 1 minute. EMS noted the victim was pale.   Patient was seen by neurology who determined that she was likely having focal seizures. They started her on Keppra 250 mg BID, ordered an EEG and brain MRI without contrast. EEG showed mild to moderate diffuse encephalopathy. No seizures or epileptiform discharges were seen throughout the recording. MRI showed 1 cm area of evolving subacute ischemia, no hemorrhage or mass effect as well as moderate cerebral atrophy with chronic small vessel ischemic disease and remote bilateral cerebellar infarcts.   She was admitted to the medicine service, has been consulted/seen by neurology. Cardiology was asked for consult in the setting of oral anticoagulation for atrial fibrillation.  Ms. Tuckerman is a patient of Dr. Avanell Bob and was last seen as an outpatient in 2022 for a brief episodes of A. Fib. She was placed on a long term monitor that showed NSR with average HR 70, no significant pauses or any A. Fib noted. She had not followed up as outpatient since that time. She was  recently seen in consult by cardiology earlier this month 10/27/2023 when she was admitted for COVID and developed A. Fib. It looks like it was reported that she had around 24 hours of A. Fib 4/29-4/30/2025 and had maintained NSR since then. It was believed that it was likely triggered by her acute COVID infection.   At this time it was determined that the risks of long term anticoagulation did not outweigh the benefits in her. She has a CHADS-VASc score of 33 (age, gender, vascular disease) but a history of syncope/pre-syncope with frequent falls. It appears that her episodes of A. Fib were related to surgery and then with COVID infection. Her long term monitor in 2022 showed no A. Fib episodes.   After speaking with and examining the patient, as she had no family present in the room, it was difficult to get much of the history as the patient suffers from significant dementia.  Nursing staff was in the room when I went to examine the patient and stated that she had previously had family present in the room with her but they have not come by yet today.  Hopeful that the family members of this patient will return at some point before making a final decision, however they were recently involved in the decision that was made earlier this month May 2025 in regards to deferring long-term anticoagulation as the risk did not outweigh the benefits.  While the patient is pretty significantly mentally altered, she answers questions appropriately.  She denied any pain or other symptoms at this time.  She remains in normal sinus rhythm with  heart rate in the 50-60s.   Past Medical History:  Diagnosis Date   Anemia    Anxiety    Atrial fibrillation (HCC)    Colon adenocarcinoma (HCC)    DJD (degenerative joint disease)    Fibromyalgia    GERD (gastroesophageal reflux disease)    History of poliomyelitis    History of UTI    Hyperlipemia    Memory disorder 03/01/2019   Other drug allergy(995.27)    Scoliosis     Surgical or other procedure not carried out because of patient's decision    Venous insufficiency     Past Surgical History:  Procedure Laterality Date   APPENDECTOMY     LOW ANTERIOR BOWEL RESECTION  06/2009   with anastomosis for colon cancer   OTHER SURGICAL HISTORY     T & A   TOTAL ABDOMINAL HYSTERECTOMY W/ BILATERAL SALPINGOOPHORECTOMY  1980s    Home Medications:  Prior to Admission medications   Medication Sig Start Date End Date Taking? Authorizing Provider  Cholecalciferol (VITAMIN D3) 50 MCG (2000 UT) TABS Take 1 tablet by mouth daily.   Yes [provider]  cyanocobalamin  1000 MCG tablet Take 1 tablet (1,000 mcg total) by mouth daily. 04/20/23  Yes Adhikari, Amrit, MD  lidocaine (XYLOCAINE) 2 % solution Use as directed 15 mLs in the mouth or throat every 6 (six) hours as needed for mouth pain.   Yes [provider]  metoprolol  succinate (TOPROL  XL) 50 MG 24 hr tablet Take 1 tablet (50 mg total) by mouth daily. 10/28/23 10/27/24 Yes Danford, Willis Harter, MD  PSYLLIUM PO Take 0.4 g by mouth at bedtime.   Yes [provider]  bisacodyl (DULCOLAX) 5 MG EC tablet Take 5 mg by mouth daily as needed for moderate constipation.    [provider]  melatonin 3 MG TABS tablet Take 1 tablet (3 mg total) by mouth at bedtime as needed (insomnia). 04/19/23   Leona Rake, MD  TYLENOL  8 HOUR ARTHRITIS PAIN 650 MG CR tablet Take 650 mg by mouth every 12 (twelve) hours as needed for pain.    [provider]    Inpatient Medications: Scheduled Meds:  [START ON 11/17/2023]  stroke: early stages of recovery book   Does not apply Once   aspirin  81 mg Oral Daily   clopidogrel  75 mg Oral Daily   feeding supplement  237 mL Oral TID BM   levETIRAcetam  250 mg Intravenous BID   Or   levETIRAcetam  250 mg Oral BID   metoprolol  succinate  50 mg Oral Daily   sodium chloride  flush  10-40 mL Intracatheter Q12H   Continuous Infusions:  sodium chloride  75  mL/hr at 11/16/23 0059   heparin 650 Units/hr (11/16/23 0226)   PRN Meds: acetaminophen , melatonin, polyethylene glycol, prochlorperazine, sodium chloride  flush  Allergies:    Allergies  Allergen Reactions   Sulfa Antibiotics Anaphylaxis, Hypertension and Other (See Comments)    Elevated HR and BP, also and "ALLERGIC," per [facility]  REACTION: elevated HR and BP   Alendronate  Other (See Comments)    "twitching" and "ALLERGIC," per [facility]   Aspirin Other (See Comments)    Made the ears ring and "ALLERGIC," per MAR   Cyclobenzaprine Hcl Other (See Comments)    Nightmares- "ALLERGIC," per [facility]   Metaxalone Other (See Comments)    Nightmares- "ALLERGIC," per [facility]   Nitrofurantoin Other (See Comments) and Hypertension    Elevated B/P and chest pain- "ALLERGIC," per  [facility]   Aricept  [Donepezil ] Rash and Other (See Comments)    Dizziness and cramping also- "ALLERGIC," per [facility]   Nortriptyline Hcl Rash and Other (See Comments)    "ALLERGIC," per MAR   Nsaids Rash and Other (See Comments)    "ALLERGIC," per [facility]   Penicillins Rash and Other (See Comments)    High fever, too- "ALLERGIC," per [facility]   Social History:   Social History   Socioeconomic History   Marital status: Married    Spouse name: Charnette Younkin   Number of children: 1   Years of education: 15   Highest education level: Not on file  Occupational History   Occupation: Brewing technologist    Comment: Works part time    Employer: [employer]  Tobacco Use   Smoking status: Never   Smokeless tobacco: Never  Substance and Sexual Activity   Alcohol use: No   Drug use: No   Sexual activity: Not on file  Other Topics Concern   Not on file  Social History Narrative   Originally from [place] and has lived with her husband in [place] since 1968.   Fun: Exercises regularly, [place] and have breakfast, walking.   Denies abuse and feels safe at home.       Right handed    Caffeine ~ 3 cups per day    Living with his daughter     Social Drivers of Health   Financial Resource Strain: Low Risk  (10/24/2019)   Overall Financial Resource Strain (CARDIA)    Difficulty of Paying Living Expenses: Not hard at all  Food Insecurity: No Food Insecurity (10/25/2023)   Hunger Vital Sign    Worried About Running Out of Food in the Last Year: Never true    Ran Out of Food in the Last Year: Never true  Transportation Needs: No Transportation Needs (10/25/2023)   PRAPARE - Administrator, Civil Service (Medical): No    Lack of Transportation (Non-Medical): No  Physical Activity: Not on file  Stress: Not on file  Social Connections: Socially Integrated (10/26/2023)   Social Connection and Isolation Panel [NHANES]    Frequency of Communication with Friends and Family: More than three times a week    Frequency of Social Gatherings with Friends and Family: Three times a week    Attends Religious Services: More than 4 times per year    Active Member of Clubs or Organizations: Yes    Attends Banker Meetings: More than 4 times per year    Marital Status: Married  Catering manager Violence: Not At Risk (10/25/2023)   Humiliation, Afraid, Rape, and Kick questionnaire    Fear of Current or Ex-Partner: No    Emotionally Abused: No    Physically Abused: No    Sexually Abused: No    Family History:   Family History  Problem Relation Age of Onset   Stroke Father    Cancer Mother        brain   Colon cancer Neg Hx     ROS:  Please see the history of present illness.  All other ROS reviewed and negative.     Physical Exam/Data:   Vitals:   11/16/23 0925 11/16/23 1000 11/16/23 1106 11/16/23 1230  BP: (!) 110/59 (!) 131/54  (!) 110/97  Pulse: (!) 55 61  64  Resp:  (!) 21  15  Temp:   98 F (36.7 C)   TempSrc:   Oral  SpO2:  99%  100%  Weight:      Height:        Intake/Output Summary (Last 24 hours) at 11-26-2023 1253 Last data filed at 2023-11-26 1043 Gross per 24 hour  Intake 1563.56 ml   Output --  Net 1563.56 ml      11/15/2023    2:03 PM 10/24/2023   11:00 PM 10/27/2022    9:06 AM  Last 3 Weights  Weight (lbs) 118 lb 2.7 oz 118 lb 2.7 oz 118 lb 2.7 oz  Weight (kg) 53.6 kg 53.6 kg 53.6 kg     Body mass index is 22.33 kg/m.   General:  Well nourished, well developed, in no acute distress HEENT: normal Neck: no JVD Vascular: Distal pulses 2+ bilaterally Cardiac:  normal S1, S2; RRR; no murmur  Lungs:  clear to auscultation bilaterally, no wheezing, rhonchi or rales  Abd: soft, nontender Ext: no edema Musculoskeletal:  No deformities, BUE and BLE strength normal and equal Skin: warm and dry  Psych: Pleasantly confused, baseline dementia  EKG:  The EKG was personally reviewed and demonstrates:  sinus rhythm, HR 54  Telemetry:  Telemetry was personally reviewed and demonstrates: Sinus rhythm, heart rate 50s to 60s  Relevant CV Studies: Echocardiogram, 11-26-23 Ordered pending results  Cardiac monitor, 08/12/2020 Normal Sinus rhythm   Average HR 70 bpm   No significant pauses No atrial fibrillation ONe 6 beat NSVT  One 7 sec sustained VT (approx 130 bpm)  Neither episode sensed  Laboratory Data:  High Sensitivity Troponin:   Recent Labs  Lab 10/24/23 1700 10/24/23 1912  TROPONINIHS 10 14     Chemistry Recent Labs  Lab 11/15/23 1359 2023-11-26 0556  NA 137 136  K 3.8 3.7  CL 101 104  CO2 27 23  GLUCOSE 115* 82  BUN 10 11  CREATININE 0.89 0.82  CALCIUM  8.7* 8.1*  MG 1.9 1.8  GFRNONAA >60 >60  ANIONGAP 9 9    Recent Labs  Lab 11/15/23 1359 11-26-23 0556  PROT 6.5 5.9*  ALBUMIN 3.0* 2.7*  AST 26 25  ALT 15 15  ALKPHOS 91 77  BILITOT 0.6 0.3   Lipids  Recent Labs  Lab 2023/11/26 0556  CHOL 149  TRIG 82  HDL 37*  LDLCALC 96  CHOLHDL 4.0    Hematology Recent Labs  Lab 11/15/23 1359 Nov 26, 2023 0556  WBC 6.8 5.2  RBC 4.59 4.18  HGB 13.2 12.2  HCT 41.0 37.6  MCV 89.3 90.0  MCH 28.8 29.2  MCHC 32.2 32.4  RDW 13.4 13.5   PLT 201 197   Thyroid  No results for input(s): "TSH", "FREET4" in the last 168 hours.  BNPNo results for input(s): "BNP", "PROBNP" in the last 168 hours.  DDimer No results for input(s): "DDIMER" in the last 168 hours.  Radiology/Studies:  EEG adult Result Date: 2023/11/26 Arleene Lack, MD     11-26-23 11:21 AM Patient Name: Ellizabeth Dacruz MRN: [id number] Epilepsy Attending: Arleene Lack Referring Physician/Provider: Iva Mariner, MD Date: Nov 26, 2023 Duration: 26.03 mins Patient history: 89yo F. Was sitting at a table when she developed right gaze and tremors concerning for seizure lasting approximately 1 minute. EEG to evaluate for seizure Level of alertness: Awake AEDs during EEG study: LEV Technical aspects: This EEG study was done with scalp electrodes positioned according to the 10-20 International system of electrode placement. Electrical activity was reviewed with band pass filter of 1-70Hz , sensitivity of 7 uV/mm, display speed of 74mm/sec with  a 60Hz  notched filter applied as appropriate. EEG data were recorded continuously and digitally stored.  Video monitoring was available and reviewed as appropriate. Description: The posterior dominant rhythm consists of 7 Hz activity of moderate voltage (25-35 uV) seen predominantly in posterior head regions, symmetric and reactive to eye opening and eye closing. EEG showed continuous generalized 5 to 7 Hz theta slowing admixed with fronto-central 13-15hz  beta activity. Hyperventilation and photic stimulation were not performed.   ABNORMALITY - Continuous slow, generalized IMPRESSION: This study is suggestive of mild to moderate diffuse encephalopathy. No seizures or epileptiform discharges were seen throughout the recording. Arleene Lack   MR Brain W and Wo Contrast Result Date: 11/16/2023 CLINICAL DATA:  Initial evaluation for acute seizure. EXAM: MRI HEAD WITHOUT AND WITH CONTRAST TECHNIQUE: Multiplanar, multiecho pulse sequences of  the brain and surrounding structures were obtained without and with intravenous contrast. CONTRAST:  5mL GADAVIST GADOBUTROL 1 MMOL/ML IV SOLN COMPARISON:  CT from earlier the same day. FINDINGS: Brain: Examination markedly degraded by motion artifact. Moderately advanced temporal lobe predominant cerebral atrophy. Patchy and confluent T2/FLAIR hyperintensity involving the periventricular and deep white matter, consistent chronic small vessel ischemic disease, moderate in nature. Multiple scattered remote small bilateral cerebellar infarcts noted. 1 cm focus of mild diffusion signal abnormality seen involving the subcortical right frontal operculum (series 3, image 33), likely a small focus of evolving subacute ischemia. No associated hemorrhage or mass effect. No other evidence for acute or subacute ischemia. Gray-white matter differentiation otherwise maintained. No acute or significant chronic intracranial blood products. No mass lesion, midline shift or mass effect. Ventricular prominence related to global parenchymal volume loss without hydrocephalus. No extra-axial fluid collection. Pituitary gland within normal limits. No other intrinsic temporal lobe abnormality. No visible abnormal enhancement. Vascular: Right vertebral artery hypoplastic and not well seen. Major intracranial vascular flow voids are otherwise well maintained. Skull and upper cervical spine: Craniocervical junction normal limits. Bone marrow signal intensity grossly normal. No scalp soft tissue abnormality. Sinuses/Orbits: Prior bilateral ocular lens replacement. Scattered mucosal thickening present throughout the paranasal sinuses, greater on the right. No significant mastoid effusion. Other: None. IMPRESSION: 1. 1 cm focus of mild diffusion signal abnormality involving the subcortical right frontal operculum, likely a small focus of evolving subacute ischemia. No associated hemorrhage or mass effect. 2. No other acute intracranial  abnormality. 3. Moderately advanced temporal lobe predominant cerebral atrophy with chronic small vessel ischemic disease, with multiple scattered remote bilateral cerebellar infarcts. Electronically Signed   By: Virgia Griffins M.D.   On: 11/16/2023 00:22   CT ANGIO HEAD NECK W WO CM Result Date: 11/15/2023 CLINICAL DATA:  Initial evaluation for acute stroke/TIA. EXAM: CT ANGIOGRAPHY HEAD AND NECK WITH AND WITHOUT CONTRAST TECHNIQUE: Multidetector CT imaging of the head and neck was performed using the standard protocol during bolus administration of intravenous contrast. Multiplanar CT image reconstructions and MIPs were obtained to evaluate the vascular anatomy. Carotid stenosis measurements (when applicable) are obtained utilizing NASCET criteria, using the distal internal carotid diameter as the denominator. RADIATION DOSE REDUCTION: This exam was performed according to the departmental dose-optimization program which includes automated exposure control, adjustment of the mA and/or kV according to patient size and/or use of iterative reconstruction technique. CONTRAST:  75mL OMNIPAQUE  IOHEXOL  350 MG/ML SOLN COMPARISON:  CT from earlier the same day. FINDINGS: Visualized aortic arch CTA NECK FINDINGS Aortic arch: Within normal limits for caliber standard 3 vessel morphology. Aortic atherosclerosis. No significant stenosis about the origin the great  vessels. Right carotid system: Right common and internal carotid arteries are patent without dissection. Mild atheromatous change about the right carotid bulb without stenosis. Left carotid system: Left common and internal carotid arteries are patent without stenosis or dissection. Vertebral arteries: Both vertebral arteries arise from subclavian arteries. Left vertebral artery dominant. Vertebral arteries patent without stenosis or dissection. Skeleton: No worrisome osseous lesions. Other neck: No other acute finding. Upper chest: Scattered peribronchial  thickening within the visualized lungs. Pleuroparenchymal thickening/scarring noted at the posterior left upper lobe. Review of the MIP images confirms the above findings CTA HEAD FINDINGS Anterior circulation: Atheromatous change about the carotid siphons without hemodynamically significant stenosis. A1 segments patent bilaterally. Normal anterior communicating artery complex. Right ACA patent without significant stenosis. Severe distal left 8 3 stenosis noted (series 12, image 34). Atheromatous irregularity about the M1 segments bilaterally with moderate stenosis at the mid left M1 segment, and severe stenosis at the right MCA bifurcation (series 11, image 24). Atheromatous change about the MCA branches with associated moderate to severe multifocal stenoses. No proximal MCA branch occlusion. Posterior circulation: Dominant left V4 segment widely patent. Right vertebral artery markedly hypoplastic but patent as well. Left PICA patent at its origin. Right PICA origin not well seen. Basilar patent without stenosis. Superior cerebral arteries patent bilaterally. Both PCAs primarily supplied via the basilar. Atheromatous change about the PCAs with associated moderate to severe bilateral P2 stenoses. PCAs remain patent to their distal aspects. Venous sinuses: Not well assessed due to timing the contrast bolus. Anatomic variants: As above.  No aneurysm. Review of the MIP images confirms the above findings IMPRESSION: 1. Negative CTA for large vessel occlusion or other emergent finding. 2. Intracranial atherosclerotic disease with associated moderate to severe multifocal stenoses about the M1 segments, MCA branches, and bilateral PCAs as above. 3. No hemodynamically significant stenosis within the neck. 4.  Aortic Atherosclerosis (ICD10-I70.0). Electronically Signed   By: Virgia Griffins M.D.   On: 11/15/2023 22:17   CT ABDOMEN PELVIS W CONTRAST Result Date: 11/15/2023 CLINICAL DATA:  Acute abdominal pain EXAM: CT  ABDOMEN AND PELVIS WITH CONTRAST TECHNIQUE: Multidetector CT imaging of the abdomen and pelvis was performed using the standard protocol following bolus administration of intravenous contrast. RADIATION DOSE REDUCTION: This exam was performed according to the departmental dose-optimization program which includes automated exposure control, adjustment of the mA and/or kV according to patient size and/or use of iterative reconstruction technique. CONTRAST:  75mL OMNIPAQUE  IOHEXOL  350 MG/ML SOLN COMPARISON:  None Available. FINDINGS: Lower chest: No acute abnormality. Hepatobiliary: No focal liver abnormality is seen. No gallstones, gallbladder wall thickening, or biliary dilatation. Pancreas: Unremarkable. No pancreatic ductal dilatation or surrounding inflammatory changes. Spleen: Normal in size without focal abnormality. Adrenals/Urinary Tract: Adrenal glands are within normal limits. Kidneys demonstrate a normal enhancement pattern bilaterally. No renal calculi or obstructive changes are seen. The bladder is well distended. Stomach/Bowel: The appendix is within normal limits. No obstructive or inflammatory changes of the colon are seen. Small bowel and stomach are within normal limits. Vascular/Lymphatic: Aortic atherosclerosis. No enlarged abdominal or pelvic lymph nodes. Reproductive: Status post hysterectomy. No adnexal masses. Other: No abdominal wall hernia or abnormality. No abdominopelvic ascites. Musculoskeletal: No acute or significant osseous findings. IMPRESSION: No acute abnormality is identified within the abdomen correspond with the given clinical history. Electronically Signed   By: Violeta Grey M.D.   On: 11/15/2023 21:24   VAS US  LOWER EXTREMITY VENOUS (DVT) (ONLY [facility] & [facility]) Result Date: 11/15/2023  Lower  Venous DVT Study Patient Name:  EMLYN MAVES [facility]  Date of Exam:   11/15/2023 Medical Rec #: [id number]                Accession #:    [id number] Date of Birth: [birth year]                 Patient Gender: F Patient Age:   32 years Exam Location:  [facility] Procedure:      VAS US  LOWER EXTREMITY VENOUS (DVT) Referring Phys: Iva Mariner --------------------------------------------------------------------------------  Indications: Pain, and Swelling.  Limitations: Patient's position. Comparison Study: No recent priors. Performing Technologist: Franky Ivanoff Sturdivant-Jones RDMS, RVT  Examination Guidelines: A complete evaluation includes B-mode imaging, spectral Doppler, color Doppler, and power Doppler as needed of all accessible portions of each vessel. Bilateral testing is considered an integral part of a complete examination. Limited examinations for reoccurring indications may be performed as noted. The reflux portion of the exam is performed with the patient in reverse Trendelenburg.  +-----+---------------+---------+-----------+----------+--------------+ RIGHTCompressibilityPhasicitySpontaneityPropertiesThrombus Aging +-----+---------------+---------+-----------+----------+--------------+ CFV  Full           Yes      Yes                                 +-----+---------------+---------+-----------+----------+--------------+ SFJ  Full                                                        +-----+---------------+---------+-----------+----------+--------------+   +---------+---------------+---------+-----------+----------+--------------+ LEFT     CompressibilityPhasicitySpontaneityPropertiesThrombus Aging +---------+---------------+---------+-----------+----------+--------------+ CFV      Full           Yes      Yes                                 +---------+---------------+---------+-----------+----------+--------------+ SFJ      Full                                                        +---------+---------------+---------+-----------+----------+--------------+ FV Prox  Full                                                         +---------+---------------+---------+-----------+----------+--------------+ FV Mid   Full                                                        +---------+---------------+---------+-----------+----------+--------------+ FV DistalFull                                                        +---------+---------------+---------+-----------+----------+--------------+  PFV      Full                                                        +---------+---------------+---------+-----------+----------+--------------+ POP      Full           Yes      Yes                                 +---------+---------------+---------+-----------+----------+--------------+ PTV      Full                                                        +---------+---------------+---------+-----------+----------+--------------+ PERO     Full                                                        +---------+---------------+---------+-----------+----------+--------------+     Summary: RIGHT: - No evidence of common femoral vein obstruction.   LEFT: - There is no evidence of deep vein thrombosis in the lower extremity.  - No cystic structure found in the popliteal fossa.  *See table(s) above for measurements and observations. Electronically signed by Genny Kid MD on 11/15/2023 at 7:08:53 PM.    Final    DG Chest Portable 1 View Result Date: 11/15/2023 CLINICAL DATA:  Seizure EXAM: PORTABLE CHEST 1 VIEW COMPARISON:  10/24/2023 FINDINGS: Heart and mediastinal contours are within normal limits. No focal opacities or effusions. No acute bony abnormality. IMPRESSION: No active disease. Electronically Signed   By: Janeece Mechanic M.D.   On: 11/15/2023 17:24   CT HEAD WO CONTRAST Result Date: 11/15/2023 CLINICAL DATA:  Seizure, new-onset, no history of trauma. EXAM: CT HEAD WITHOUT CONTRAST TECHNIQUE: Contiguous axial images were obtained from the base of the skull through the vertex without intravenous contrast.  RADIATION DOSE REDUCTION: This exam was performed according to the departmental dose-optimization program which includes automated exposure control, adjustment of the mA and/or kV according to patient size and/or use of iterative reconstruction technique. COMPARISON:  Head CT 10/24/2023 and MRI 04/19/2023 FINDINGS: Brain: There is no evidence of an acute infarct, intracranial hemorrhage, mass, midline shift, or extra-axial fluid collection. Patchy hypodensities in the cerebral white matter bilaterally are unchanged and nonspecific but compatible with moderate chronic small vessel ischemic disease small chronic bilateral cerebellar infarcts are unchanged. Advanced cerebral atrophy is again noted including pronounced bilateral temporal lobe volume loss. Vascular: Calcified atherosclerosis at the skull base. No hyperdense vessel. Skull: No fracture or suspicious lesion. Sinuses/Orbits: Moderate right frontal and right ethmoid sinus opacification, possibly a combination of mucosal thickening and fluid/secretions. Mild mucosal thickening in the right maxillary sinus. Clear mastoid air cells. Bilateral cataract extraction. Other: None. IMPRESSION: 1. No evidence of acute intracranial abnormality. 2. Moderate chronic small vessel ischemic disease and advanced cerebral atrophy. Electronically Signed   By: Aundra Lee M.D.   On: 11/15/2023 16:19     Assessment and  Plan:   Paroxysmal atrial fibrillation Remote episodes of A. Fib in the past noted to be post-op and with COVID infection Cardiac monitor from 2022 showed no A. Fib Chronic anticoagulation was previously deferred due to syncope/near syncope and frequent falls  Home meds: Toprol  50 mg daily EKG shows NSR  Currently in sinus rhythm, HR 50s to 60s Patient is currently awaiting speech to evaluate swallowing Okay to hold PTA metoprolol  succinate 50 mg daily as HR remains in the 50s --can reinitiate as needed Patient's family recently came to the agreeance  earlier this month 10/27/2023 to defer long-term anticoagulation due to failure to thrive, frequent falls, syncope/presyncope.  It was thought that the risk did not outweigh the benefits at this time I agree that it is likely the patient is still not a good candidate for anticoagulation given severe dementia, in a memory care unit, frequent falls. Will discuss with MD  Per primary Seizures Possible subacute CVA Dementia  Hypoalbuminemia Dysphagia Likely FTT    Risk Assessment/Risk Scores:       CHA2DS2-VASc Score = 4   This indicates a 4.8% annual risk of stroke. The patient's score is based upon: CHF History: 0 HTN History: 0 Diabetes History: 0 Stroke History: 0 Vascular Disease History: 1 Age Score: 2 Gender Score: 1         For questions or updates, please contact [facility] Please consult [email or web address] for contact info under    Signed, Jiles Mote, PA-C  11/16/2023 12:53 PM  Personally seen and examined. Agree with above.  89 year old female here with seizure, previously described paroxysmal atrial fibrillation for approximately 24 hours on telemetry in the last hospital setting personally reviewed, felt not to be a candidate for long-term anticoagulation based upon her advanced dementia, frequent falls.  She is here today with MRI showing subacute ischemia small area.  We were asked to see her to determine long-term anticoagulation strategy for her.  Currently she is in sinus rhythm.  Pleasant, not alert to place time.  She is in no distress.  Heart regular rate and rhythm.  No edema.  LDL 96 ALT 15 creatinine 0.89 troponin 10  Neurology consultation note pending reviewed.  Paroxysmal atrial fibrillation -Agree that she is not a good candidate for anticoagulation given her severe dementia frequent falls, memory care unit status.  I agree with my partner who saw her in consultation approximately 3 weeks ago who came to the same conclusion.  I think  risks outweigh the benefits.  I would not be opposed to her going on standard stroke protocol Plavix and aspirin currently, then 3 weeks later Plavix alone.  It would not be unreasonable to go ahead and discontinue her IV heparin.  Hopefully we can get her back to her memory care facility soon.  We will go and sign off.  Please let us  know if we can be of further assistance.  Dorothye Gathers, MD

## 2023-11-16 NOTE — ED Notes (Signed)
 EEG at bedside, EEG to establish baseline prior to keppra admin.

## 2023-11-16 NOTE — Progress Notes (Addendum)
 Routine EEG completed, results pending Neurology review and interpretation

## 2023-11-16 NOTE — Evaluation (Signed)
 Physical Therapy Evaluation Patient Details Name: Brenda Hammond MRN: [id number] DOB: May 29, 1933 Today's Date: 11/16/2023  History of Present Illness  89 y.o. female [facility]15/23 from [address] dementia unit due to having a seizure w/ R gaze deviation for 1 minute. Head CT negative for acute findings. Recent admit for encephalopathy 2/2 Covid-19. PMHx:  atrial fibrillation, GERD, arthritis, fibromyalgia, HLD, anemia, colon cancer, dementia, HTN   Clinical Impression  PLOF and home set-up taken from phone call with daughter. PTA, pt was ModI with SP cane and had assist for all ADLs and iADLs from staff at ALF. In today's session, pt was confused and lethargic. Required ModA for bed mobility with multiple cues for technique and sequencing. Upon sitting upright, pt required MinA/CGA for seated balance with posterior lean. Pt requested to return to supine with EEG technician/RN in room for procedure. ModA to return to supine with TotalAx2 to scoot towards HOB. Family would prefer return to ALF with HHPT at facility and 24/7 assist available if pt is needing more assistance. Pt presents with functional limitations below and would benefit from acute PT. Acute PT to follow.        If plan is discharge home, recommend the following: Direct supervision/assist for medications management;Assist for transportation;Supervision due to cognitive status;A lot of help with walking and/or transfers;A lot of help with bathing/dressing/bathroom;Assistance with cooking/housework;Direct supervision/assist for financial management   Can travel by private vehicle    (TBA)    Equipment Recommendations Rolling walker (2 wheels)     Functional Status Assessment Patient has had a recent decline in their functional status and demonstrates the ability to make significant improvements in function in a reasonable and predictable amount of time.     Precautions / Restrictions Precautions Precautions:  Fall Precaution/Restrictions Comments: Hx Dementia Restrictions Weight Bearing Restrictions Per Provider Order: No      Mobility  Bed Mobility Overal bed mobility: Needs Assistance Bed Mobility: Rolling, Sidelying to Sit, Sit to Supine Rolling: Mod assist Sidelying to sit: Mod assist   Sit to supine: Mod assist   General bed mobility comments: ModA to bring LE's off EOB and raise trunk. Pt able to lean onto shoulder with ModA to bring LE's onto EOB and shift trunk. TotalAx2 to scoot towards HOB in supine    Transfers     General transfer comment: Pt declined transfer      Balance Overall balance assessment: Needs assistance, Mild deficits observed, not formally tested Sitting-balance support: Feet unsupported, Bilateral upper extremity supported Sitting balance-Leahy Scale: Fair Sitting balance - Comments: MinA to CGA for posterior lean. Unable to get feet flat on ground due to high stretcher height Postural control: Posterior lean        Pertinent Vitals/Pain Pain Assessment Pain Assessment: No/denies pain    Home Living Family/patient expects to be discharged to:: Assisted living    Home Equipment: Jeananne Mighty - single point Additional Comments: [place] [facility] [employer]    Prior Function Prior Level of Function : Needs assist    Mobility Comments: ModI with SP cane ADLs Comments: assist from staff for bathing, dressing.     Extremity/Trunk Assessment   Upper Extremity Assessment Upper Extremity Assessment: Defer to OT evaluation    Lower Extremity Assessment Lower Extremity Assessment: Generalized weakness;Difficult to assess due to impaired cognition    Cervical / Trunk Assessment Cervical / Trunk Assessment: Kyphotic  Communication   Communication Communication: Impaired Factors Affecting Communication: Difficulty expressing self    Cognition Arousal: Alert  Behavior During Therapy: WFL for tasks assessed/performed   PT - Cognitive impairments: History  of cognitive impairments    PT - Cognition Comments: AxO x 1 responds well to Daughter.  Pt resides at Memory Care Unit at ALF Following commands: Impaired Following commands impaired: Follows one step commands inconsistently     Cueing Cueing Techniques: Verbal cues, Gestural cues, Tactile cues      PT Assessment Patient needs continued PT services  PT Problem List Decreased strength;Decreased mobility;Decreased activity tolerance;Decreased cognition;Decreased safety awareness;Decreased balance       PT Treatment Interventions DME instruction;Therapeutic activities;Gait training;Functional mobility training;Therapeutic exercise;Patient/family education;Balance training;Neuromuscular re-education    PT Goals (Current goals can be found in the Care Plan section)  Acute Rehab PT Goals Patient Stated Goal: to go back to ALF PT Goal Formulation: With family Time For Goal Achievement: 11/30/23 Potential to Achieve Goals: Fair    Frequency Min 2X/week        AM-PAC PT "6 Clicks" Mobility  Outcome Measure Help needed turning from your back to your side while in a flat bed without using bedrails?: A Lot Help needed moving from lying on your back to sitting on the side of a flat bed without using bedrails?: A Lot Help needed moving to and from a bed to a chair (including a wheelchair)?: A Lot Help needed standing up from a chair using your arms (e.g., wheelchair or bedside chair)?: A Lot Help needed to walk in hospital room?: A Lot Help needed climbing 3-5 steps with a railing? : A Lot 6 Click Score: 12    End of Session   Activity Tolerance: Patient limited by fatigue Patient left: in bed;with call bell/phone within reach;with nursing/sitter in room (RN and EEG technician) Nurse Communication: Mobility status PT Visit Diagnosis: Other abnormalities of gait and mobility (R26.89);Repeated falls (R29.6);Muscle weakness (generalized) (M62.81);Difficulty in walking, not elsewhere  classified (R26.2)    Time: 1002-1016 PT Time Calculation (min) (ACUTE ONLY): 14 min   Charges:   PT Evaluation $PT Eval Low Complexity: 1 Low   PT General Charges $$ ACUTE PT VISIT: 1 Visit    Orysia Blas, PT, DPT Secure Chat Preferred  Rehab Office [phone]   Alissa April Adela Ades 11/16/2023, 10:50 AM

## 2023-11-16 NOTE — Progress Notes (Signed)
 PHARMACY - ANTICOAGULATION CONSULT NOTE  Pharmacy Consult for heparin Indication: atrial fibrillation in setting of subacute CVA  Allergies  Allergen Reactions   Sulfa Antibiotics Anaphylaxis, Hypertension and Other (See Comments)    Elevated HR and BP, also and "ALLERGIC," per MAR  REACTION: elevated HR and BP   Alendronate  Other (See Comments)    "twitching" and "ALLERGIC," per [facility]   Aspirin Other (See Comments)    Made the ears ring and "ALLERGIC," per MAR   Cyclobenzaprine Hcl Other (See Comments)    Nightmares- "ALLERGIC," per [facility]   Metaxalone Other (See Comments)    Nightmares- "ALLERGIC," per MAR   Nitrofurantoin Other (See Comments) and Hypertension    Elevated B/P and chest pain- "ALLERGIC," per [facility]   Aricept  [Donepezil ] Rash and Other (See Comments)    Dizziness and cramping also- "ALLERGIC," per MAR   Nortriptyline Hcl Rash and Other (See Comments)    "ALLERGIC," per MAR   Nsaids Rash and Other (See Comments)    "ALLERGIC," per MAR   Penicillins Rash and Other (See Comments)    High fever, too- "ALLERGIC," per [facility]    Patient Measurements: Height: 5\' 1"  (154.9 cm) Weight: 53.6 kg (118 lb 2.7 oz) IBW/kg (Calculated) : 47.8 HEPARIN DW (KG): 53.6  Vital Signs: Temp: 96.6 F (35.9 C) (05/20 1745) Temp Source: Axillary (05/20 1745) BP: 91/53 (05/21 0030) Pulse Rate: 51 (05/21 0030)  Labs: Recent Labs    11/15/23 1359  HGB 13.2  HCT 41.0  PLT 201  CREATININE 0.89    Estimated Creatinine Clearance: 33 mL/min (by C-G formula based on SCr of 0.89 mg/dL).   Medical History: Past Medical History:  Diagnosis Date   Anemia    Anxiety    Atrial fibrillation (HCC)    Colon adenocarcinoma (HCC)    DJD (degenerative joint disease)    Fibromyalgia    GERD (gastroesophageal reflux disease)    History of poliomyelitis    History of UTI    Hyperlipemia    Memory disorder 03/01/2019   Other drug allergy(995.27)    Scoliosis    Surgical or other procedure not  carried out because of patient's decision    Venous insufficiency    Assessment: 89yo female admitted with sz activity, known PAF and found to be in Afib with RVR on arrival, MRI reveals subacute CVA; pt is not on [facility] PTA d/t frequent falls but to begin heparin during admission.  Goal of Therapy:  Heparin level 0.3-0.5 units/ml Monitor platelets by anticoagulation protocol: Yes   Plan:  Heparin infusion at 650 units/hr; will maintain low goal without boluses for now. Monitor heparin levels and CBC.  Lonnie Roberts, PharmD, BCPS  11/16/2023,2:02 AM

## 2023-11-17 ENCOUNTER — Inpatient Hospital Stay (HOSPITAL_COMMUNITY)

## 2023-11-17 DIAGNOSIS — I480 Paroxysmal atrial fibrillation: Secondary | ICD-10-CM | POA: Diagnosis not present

## 2023-11-17 DIAGNOSIS — I639 Cerebral infarction, unspecified: Secondary | ICD-10-CM | POA: Diagnosis not present

## 2023-11-17 DIAGNOSIS — R29703 NIHSS score 3: Secondary | ICD-10-CM | POA: Diagnosis not present

## 2023-11-17 DIAGNOSIS — R569 Unspecified convulsions: Secondary | ICD-10-CM | POA: Diagnosis not present

## 2023-11-17 DIAGNOSIS — I69391 Dysphagia following cerebral infarction: Secondary | ICD-10-CM | POA: Diagnosis not present

## 2023-11-17 LAB — HEPARIN LEVEL (UNFRACTIONATED): Heparin Unfractionated: 0.71 [IU]/mL — ABNORMAL HIGH (ref 0.30–0.70)

## 2023-11-17 LAB — CBC
HCT: 33.7 % — ABNORMAL LOW (ref 36.0–46.0)
Hemoglobin: 11.7 g/dL — ABNORMAL LOW (ref 12.0–15.0)
MCH: 30.5 pg (ref 26.0–34.0)
MCHC: 34.7 g/dL (ref 30.0–36.0)
MCV: 88 fL (ref 80.0–100.0)
Platelets: 218 10*3/uL (ref 150–400)
RBC: 3.83 MIL/uL — ABNORMAL LOW (ref 3.87–5.11)
RDW: 13.3 % (ref 11.5–15.5)
WBC: 5.7 10*3/uL (ref 4.0–10.5)
nRBC: 0 % (ref 0.0–0.2)

## 2023-11-17 MED ORDER — GUAIFENESIN-DM 100-10 MG/5ML PO SYRP
10.0000 mL | ORAL_SOLUTION | Freq: Four times a day (QID) | ORAL | Status: DC | PRN
  Administered 2023-11-17 – 2023-11-18 (×2): 10 mL via ORAL
  Filled 2023-11-17 (×2): qty 10

## 2023-11-17 NOTE — Progress Notes (Signed)
 PROGRESS NOTE    Brenda Hammond  BJY:[id number] DOB: 10-30-1932 DOA: 11/15/2023 PCP: Brendalyn Calkins, MD    Brief Narrative:  89 y.o. female with medical history significant of advanced dementia, pAF, HTN, HLD, GERD, recent admission for COVID c/b rhabdomyolysis p/w acute metabolic encephalopathy following minute long episode of R gaze deviation and tremors c/f seizure. At the emergency room, confused.  Hemodynamically stable.  Right gaze preference.  Sleepy.  Found to have acute infarct right frontal operculum.  Also with A-fib.  Admitted with cardiology and neurology consultations. Remains in poor health. Patient is from [facility].   Subjective: Patient seen and examined.  Sitter at the bedside.  Patient is sound asleep and reportedly slept all night.  No other overnight events.  She did not wake up to gentle stimulation. Called and discussed with patient's daughter on the phone.  She is anticipating to visit her today here in the hospital, mobilize her while she is here and bring her back to memory care unit with home health PT OT.  No evidence of additional seizures noted.  Assessment & Plan:   Subacute ischemic stroke left frontal lobe, cardioembolic with history of A-fib not on anticoagulation: Clinical findings, seizure-like episode, right gaze preference and tremors. CT head findings, no acute findings.  Atrophy. MRI of the brain, 1 cm focus of mild diffusion signal abnormality involving the subcortical right frontal operculum. CT angiogram head and neck, no large vessel occlusion. 2D echocardiogram, ejection fraction 60 to 65%, grade 1 diastolic dysfunction.  No intracardiac thrombus.  No shunt. Antiplatelet therapy, none at home.  Now on aspirin Plavix for 3 months then aspirin alone. LDL 96.  Added Crestor 20 mg. Hemoglobin A1c, 5.4.  No therapy needed. On regular diet. PT OT pending today determining next level of care.  Paroxysmal A-fib: In and out of  A-fib.  Rate controlled.  On metoprolol  50 mg daily.  On heparin infusion.  Discontinue. Multiple previous discussions, patient with advanced dementia and frequent fall.  Not appropriate candidate for therapeutic anticoagulation. Communicated with family.  Possible seizure: EEG mild to moderate diffuse encephalopathy.  No seizures.  Continue seizure precautions.  Will discharge on Keppra 250 mg twice daily.  Patient's hypertension: Blood pressure stable on metoprolol .  Goal of care: Discussed with daughter Prentice Brochure who is also RN in the hospital.  She is anticipating patient will do better at memory care unit with physical therapy. DNR/DNI Anticipates discharging back to memory care unit with therapies. She will benefit with hospice services if further deteriorates.   DVT prophylaxis: SCDs   Code Status: DNR with limited intervention Family Communication: Daughter on the phone Disposition Plan: Status is: Inpatient Remains inpatient appropriate because: Stroke workup     Consultants:  Neurology Cardiology  Procedures:  EEG  Antimicrobials:  None     Objective: Vitals:   11/17/23 0010 11/17/23 0413 11/17/23 0637 11/17/23 0827  BP: (!) 140/99 122/86  (!) 127/56  Pulse: 69 66  62  Resp: 16 16  20   Temp: 97.8 F (36.6 C) 97.6 F (36.4 C)  98.4 F (36.9 C)  TempSrc: Axillary Axillary  Oral  SpO2: 100% 100%  99%  Weight:   56.5 kg   Height:        Intake/Output Summary (Last 24 hours) at 11/17/2023 0834 Last data filed at 11/17/2023 0865 Gross per 24 hour  Intake 312.5 ml  Output 300 ml  Net 12.5 ml   Filed Weights   11/15/23 1403  11/17/23 0637  Weight: 53.6 kg 56.5 kg    Examination:  General exam: Appears calm and comfortable.  Sleepy. Respiratory system: Clear to auscultation. Respiratory effort normal.  No sounds.  On room air. Cardiovascular system: S1 & S2 heard, RRR.  Gastrointestinal system: Soft and nontender. Central nervous system:  Sleepy.   Difficult to arouse.  Moves all extremities.    Data Reviewed: I have personally reviewed following labs and imaging studies  CBC: Recent Labs  Lab 11/15/23 1359 11/16/23 0556 11/17/23 0527  WBC 6.8 5.2 5.7  NEUTROABS 4.9  --   --   HGB 13.2 12.2 11.7*  HCT 41.0 37.6 33.7*  MCV 89.3 90.0 88.0  PLT 201 197 218   Basic Metabolic Panel: Recent Labs  Lab 11/15/23 1359 11/16/23 0556  NA 137 136  K 3.8 3.7  CL 101 104  CO2 27 23  GLUCOSE 115* 82  BUN 10 11  CREATININE 0.89 0.82  CALCIUM  8.7* 8.1*  MG 1.9 1.8  PHOS  --  3.8   GFR: Estimated Creatinine Clearance: 35.8 mL/min (by C-G formula based on SCr of 0.82 mg/dL). Liver Function Tests: Recent Labs  Lab 11/15/23 1359 11/16/23 0556  AST 26 25  ALT 15 15  ALKPHOS 91 77  BILITOT 0.6 0.3  PROT 6.5 5.9*  ALBUMIN 3.0* 2.7*   No results for input(s): "LIPASE", "AMYLASE" in the last 168 hours. No results for input(s): "AMMONIA" in the last 168 hours. Coagulation Profile: No results for input(s): "INR", "PROTIME" in the last 168 hours. Cardiac Enzymes: No results for input(s): "CKTOTAL", "CKMB", "CKMBINDEX", "TROPONINI" in the last 168 hours. BNP (last 3 results) No results for input(s): "PROBNP" in the last 8760 hours. HbA1C: Recent Labs    11/16/23 0556  HGBA1C 5.4   CBG: Recent Labs  Lab 11/15/23 1503  GLUCAP 83   Lipid Profile: Recent Labs    11/16/23 0556  CHOL 149  HDL 37*  LDLCALC 96  TRIG 82  CHOLHDL 4.0   Thyroid  Function Tests: No results for input(s): "TSH", "T4TOTAL", "FREET4", "T3FREE", "THYROIDAB" in the last 72 hours. Anemia Panel: No results for input(s): "VITAMINB12", "FOLATE", "FERRITIN", "TIBC", "IRON", "RETICCTPCT" in the last 72 hours. Sepsis Labs: Recent Labs  Lab 11/16/23 1101  PROCALCITON <0.10    Recent Results (from the past 240 hours)  Culture, blood (Routine X 2) w Reflex to ID Panel     Status: None (Preliminary result)   Collection Time: 11/16/23  9:34 AM    Specimen: BLOOD RIGHT FOREARM  Result Value Ref Range Status   Specimen Description BLOOD RIGHT FOREARM  Final   Special Requests   Final    BOTTLES DRAWN AEROBIC AND ANAEROBIC Blood Culture adequate volume   Culture   Final    NO GROWTH < 24 HOURS Performed at [facility], [address] [place], [place] [address]    Report Status PENDING  Incomplete  Culture, blood (Routine X 2) w Reflex to ID Panel     Status: None (Preliminary result)   Collection Time: 11/16/23  7:35 PM   Specimen: BLOOD  Result Value Ref Range Status   Specimen Description BLOOD SITE NOT SPECIFIED  Final   Special Requests   Final    BOTTLES DRAWN AEROBIC AND ANAEROBIC Blood Culture results may not be optimal due to an inadequate volume of blood received in culture bottles   Culture   Final    NO GROWTH < 12 HOURS Performed at  [facility], [address] [place], [place] [address]    Report Status PENDING  Incomplete         Radiology Studies: ECHOCARDIOGRAM COMPLETE Result Date: 11/16/2023    ECHOCARDIOGRAM REPORT   Patient Name:   Leshonda Galambos Date of Exam: 11/16/2023 Medical Rec #:  [id number]               Height:       61.0 in Accession #:    [id number]              Weight:       118.2 lb Date of Birth:  15-Mar-1933               BSA:          1.510 m Patient Age:    89 years                BP:           144/66 mmHg Patient Gender: F                       HR:           61 bpm. Exam Location:  Inpatient Procedure: 2D Echo, Cardiac Doppler and Color Doppler (Both Spectral and Color            Flow Doppler were utilized during procedure). Indications:    Stroke  History:        Patient has prior history of Echocardiogram examinations, most                 recent 04/19/2023.  Sonographer:    Janette Medley Referring Phys: CAROLE N HALL IMPRESSIONS  1. Left ventricular ejection fraction, by estimation, is 60 to 65%. The left ventricle has normal function. The left ventricle has no regional  wall motion abnormalities. Left ventricular diastolic parameters are consistent with Grade I diastolic dysfunction (impaired relaxation).  2. Right ventricular systolic function is normal. The right ventricular size is normal. There is normal pulmonary artery systolic pressure.  3. The mitral valve is normal in structure. Mild mitral valve regurgitation. No evidence of mitral stenosis.  4. The aortic valve is normal in structure. Aortic valve regurgitation is not visualized. No aortic stenosis is present.  5. The inferior vena cava is normal in size with greater than 50% respiratory variability, suggesting right atrial pressure of 3 mmHg. FINDINGS  Left Ventricle: Left ventricular ejection fraction, by estimation, is 60 to 65%. The left ventricle has normal function. The left ventricle has no regional wall motion abnormalities. The left ventricular internal cavity size was normal in size. There is  no left ventricular hypertrophy. Left ventricular diastolic parameters are consistent with Grade I diastolic dysfunction (impaired relaxation). Indeterminate filling pressures. Right Ventricle: The right ventricular size is normal. No increase in right ventricular wall thickness. Right ventricular systolic function is normal. There is normal pulmonary artery systolic pressure. The tricuspid regurgitant velocity is 2.48 m/s, and  with an assumed right atrial pressure of 3 mmHg, the estimated right ventricular systolic pressure is 27.6 mmHg. Left Atrium: Left atrial size was normal in size. Right Atrium: Right atrial size was normal in size. Pericardium: There is no evidence of pericardial effusion. Mitral Valve: The mitral valve is normal in structure. Mild mitral valve regurgitation. No evidence of mitral valve stenosis. Tricuspid Valve: The tricuspid valve is normal in structure. Tricuspid valve regurgitation is mild . No evidence  of tricuspid stenosis. Aortic Valve: The aortic valve is normal in structure. Aortic valve  regurgitation is not visualized. No aortic stenosis is present. Aortic valve mean gradient measures 3.0 mmHg. Aortic valve peak gradient measures 6.8 mmHg. Aortic valve area, by VTI measures 1.84 cm. Pulmonic Valve: The pulmonic valve was normal in structure. Pulmonic valve regurgitation is not visualized. No evidence of pulmonic stenosis. Aorta: The aortic root is normal in size and structure. Venous: The inferior vena cava is normal in size with greater than 50% respiratory variability, suggesting right atrial pressure of 3 mmHg. IAS/Shunts: No atrial level shunt detected by color flow Doppler.  LEFT VENTRICLE PLAX 2D LVIDd:         4.60 cm   Diastology LVIDs:         2.80 cm   LV e' medial:    6.64 cm/s LV PW:         0.90 cm   LV E/e' medial:  13.3 LV IVS:        1.00 cm   LV e' lateral:   7.72 cm/s LVOT diam:     1.80 cm   LV E/e' lateral: 11.5 LV SV:         51 LV SV Index:   34 LVOT Area:     2.54 cm  RIGHT VENTRICLE             IVC RV S prime:     20.30 cm/s  IVC diam: 1.70 cm TAPSE (M-mode): 1.7 cm LEFT ATRIUM             Index        RIGHT ATRIUM           Index LA diam:        3.10 cm 2.05 cm/m   RA Area:     13.10 cm LA Vol (A2C):   27.5 ml 18.21 ml/m  RA Volume:   29.30 ml  19.40 ml/m LA Vol (A4C):   30.6 ml 20.26 ml/m LA Biplane Vol: 29.4 ml 19.47 ml/m  AORTIC VALVE AV Area (Vmax):    1.72 cm AV Area (Vmean):   1.64 cm AV Area (VTI):     1.84 cm AV Vmax:           130.00 cm/s AV Vmean:          85.600 cm/s AV VTI:            0.279 m AV Peak Grad:      6.8 mmHg AV Mean Grad:      3.0 mmHg LVOT Vmax:         88.00 cm/s LVOT Vmean:        55.000 cm/s LVOT VTI:          0.202 m LVOT/AV VTI ratio: 0.72  AORTA Ao Root diam: 2.40 cm Ao Asc diam:  2.70 cm MITRAL VALVE                TRICUSPID VALVE MV Area (PHT): 3.08 cm     TR Peak grad:   24.6 mmHg MV Decel Time: 246 msec     TR Vmax:        248.00 cm/s MV E velocity: 88.60 cm/s MV A velocity: 107.00 cm/s  SHUNTS MV E/A ratio:  0.83          Systemic VTI:  0.20 m  Systemic Diam: 1.80 cm Maudine Sos MD Electronically signed by Maudine Sos MD Signature Date/Time: 11/16/2023/3:57:30 PM    Final    EEG adult Result Date: 11/16/2023 Arleene Lack, MD     11/16/2023 11:21 AM Patient Name: Yaneisy Wenz MRN: [id number] Epilepsy Attending: Arleene Lack Referring Physician/Provider: Iva Mariner, MD Date: 11/16/2023 Duration: 26.03 mins Patient history: 89yo F. Was sitting at a table when she developed right gaze and tremors concerning for seizure lasting approximately 1 minute. EEG to evaluate for seizure Level of alertness: Awake AEDs during EEG study: LEV Technical aspects: This EEG study was done with scalp electrodes positioned according to the 10-20 International system of electrode placement. Electrical activity was reviewed with band pass filter of 1-70Hz , sensitivity of 7 uV/mm, display speed of 53mm/sec with a 60Hz  notched filter applied as appropriate. EEG data were recorded continuously and digitally stored.  Video monitoring was available and reviewed as appropriate. Description: The posterior dominant rhythm consists of 7 Hz activity of moderate voltage (25-35 uV) seen predominantly in posterior head regions, symmetric and reactive to eye opening and eye closing. EEG showed continuous generalized 5 to 7 Hz theta slowing admixed with fronto-central 13-15hz  beta activity. Hyperventilation and photic stimulation were not performed.   ABNORMALITY - Continuous slow, generalized IMPRESSION: This study is suggestive of mild to moderate diffuse encephalopathy. No seizures or epileptiform discharges were seen throughout the recording. Arleene Lack   MR Brain W and Wo Contrast Result Date: 11/16/2023 CLINICAL DATA:  Initial evaluation for acute seizure. EXAM: MRI HEAD WITHOUT AND WITH CONTRAST TECHNIQUE: Multiplanar, multiecho pulse sequences of the brain and surrounding structures were obtained  without and with intravenous contrast. CONTRAST:  5mL GADAVIST GADOBUTROL 1 MMOL/ML IV SOLN COMPARISON:  CT from earlier the same day. FINDINGS: Brain: Examination markedly degraded by motion artifact. Moderately advanced temporal lobe predominant cerebral atrophy. Patchy and confluent T2/FLAIR hyperintensity involving the periventricular and deep white matter, consistent chronic small vessel ischemic disease, moderate in nature. Multiple scattered remote small bilateral cerebellar infarcts noted. 1 cm focus of mild diffusion signal abnormality seen involving the subcortical right frontal operculum (series 3, image 33), likely a small focus of evolving subacute ischemia. No associated hemorrhage or mass effect. No other evidence for acute or subacute ischemia. Gray-white matter differentiation otherwise maintained. No acute or significant chronic intracranial blood products. No mass lesion, midline shift or mass effect. Ventricular prominence related to global parenchymal volume loss without hydrocephalus. No extra-axial fluid collection. Pituitary gland within normal limits. No other intrinsic temporal lobe abnormality. No visible abnormal enhancement. Vascular: Right vertebral artery hypoplastic and not well seen. Major intracranial vascular flow voids are otherwise well maintained. Skull and upper cervical spine: Craniocervical junction normal limits. Bone marrow signal intensity grossly normal. No scalp soft tissue abnormality. Sinuses/Orbits: Prior bilateral ocular lens replacement. Scattered mucosal thickening present throughout the paranasal sinuses, greater on the right. No significant mastoid effusion. Other: None. IMPRESSION: 1. 1 cm focus of mild diffusion signal abnormality involving the subcortical right frontal operculum, likely a small focus of evolving subacute ischemia. No associated hemorrhage or mass effect. 2. No other acute intracranial abnormality. 3. Moderately advanced temporal lobe  predominant cerebral atrophy with chronic small vessel ischemic disease, with multiple scattered remote bilateral cerebellar infarcts. Electronically Signed   By: Virgia Griffins M.D.   On: 11/16/2023 00:22   CT ANGIO HEAD NECK W WO CM Result Date: 11/15/2023 CLINICAL DATA:  Initial evaluation for acute stroke/TIA. EXAM: CT ANGIOGRAPHY  HEAD AND NECK WITH AND WITHOUT CONTRAST TECHNIQUE: Multidetector CT imaging of the head and neck was performed using the standard protocol during bolus administration of intravenous contrast. Multiplanar CT image reconstructions and MIPs were obtained to evaluate the vascular anatomy. Carotid stenosis measurements (when applicable) are obtained utilizing NASCET criteria, using the distal internal carotid diameter as the denominator. RADIATION DOSE REDUCTION: This exam was performed according to the departmental dose-optimization program which includes automated exposure control, adjustment of the mA and/or kV according to patient size and/or use of iterative reconstruction technique. CONTRAST:  75mL OMNIPAQUE  IOHEXOL  350 MG/ML SOLN COMPARISON:  CT from earlier the same day. FINDINGS: Visualized aortic arch CTA NECK FINDINGS Aortic arch: Within normal limits for caliber standard 3 vessel morphology. Aortic atherosclerosis. No significant stenosis about the origin the great vessels. Right carotid system: Right common and internal carotid arteries are patent without dissection. Mild atheromatous change about the right carotid bulb without stenosis. Left carotid system: Left common and internal carotid arteries are patent without stenosis or dissection. Vertebral arteries: Both vertebral arteries arise from subclavian arteries. Left vertebral artery dominant. Vertebral arteries patent without stenosis or dissection. Skeleton: No worrisome osseous lesions. Other neck: No other acute finding. Upper chest: Scattered peribronchial thickening within the visualized lungs.  Pleuroparenchymal thickening/scarring noted at the posterior left upper lobe. Review of the MIP images confirms the above findings CTA HEAD FINDINGS Anterior circulation: Atheromatous change about the carotid siphons without hemodynamically significant stenosis. A1 segments patent bilaterally. Normal anterior communicating artery complex. Right ACA patent without significant stenosis. Severe distal left 8 3 stenosis noted (series 12, image 34). Atheromatous irregularity about the M1 segments bilaterally with moderate stenosis at the mid left M1 segment, and severe stenosis at the right MCA bifurcation (series 11, image 24). Atheromatous change about the MCA branches with associated moderate to severe multifocal stenoses. No proximal MCA branch occlusion. Posterior circulation: Dominant left V4 segment widely patent. Right vertebral artery markedly hypoplastic but patent as well. Left PICA patent at its origin. Right PICA origin not well seen. Basilar patent without stenosis. Superior cerebral arteries patent bilaterally. Both PCAs primarily supplied via the basilar. Atheromatous change about the PCAs with associated moderate to severe bilateral P2 stenoses. PCAs remain patent to their distal aspects. Venous sinuses: Not well assessed due to timing the contrast bolus. Anatomic variants: As above.  No aneurysm. Review of the MIP images confirms the above findings IMPRESSION: 1. Negative CTA for large vessel occlusion or other emergent finding. 2. Intracranial atherosclerotic disease with associated moderate to severe multifocal stenoses about the M1 segments, MCA branches, and bilateral PCAs as above. 3. No hemodynamically significant stenosis within the neck. 4.  Aortic Atherosclerosis (ICD10-I70.0). Electronically Signed   By: Virgia Griffins M.D.   On: 11/15/2023 22:17   CT ABDOMEN PELVIS W CONTRAST Result Date: 11/15/2023 CLINICAL DATA:  Acute abdominal pain EXAM: CT ABDOMEN AND PELVIS WITH CONTRAST  TECHNIQUE: Multidetector CT imaging of the abdomen and pelvis was performed using the standard protocol following bolus administration of intravenous contrast. RADIATION DOSE REDUCTION: This exam was performed according to the departmental dose-optimization program which includes automated exposure control, adjustment of the mA and/or kV according to patient size and/or use of iterative reconstruction technique. CONTRAST:  75mL OMNIPAQUE  IOHEXOL  350 MG/ML SOLN COMPARISON:  None Available. FINDINGS: Lower chest: No acute abnormality. Hepatobiliary: No focal liver abnormality is seen. No gallstones, gallbladder wall thickening, or biliary dilatation. Pancreas: Unremarkable. No pancreatic ductal dilatation or surrounding inflammatory changes. Spleen: Normal  in size without focal abnormality. Adrenals/Urinary Tract: Adrenal glands are within normal limits. Kidneys demonstrate a normal enhancement pattern bilaterally. No renal calculi or obstructive changes are seen. The bladder is well distended. Stomach/Bowel: The appendix is within normal limits. No obstructive or inflammatory changes of the colon are seen. Small bowel and stomach are within normal limits. Vascular/Lymphatic: Aortic atherosclerosis. No enlarged abdominal or pelvic lymph nodes. Reproductive: Status post hysterectomy. No adnexal masses. Other: No abdominal wall hernia or abnormality. No abdominopelvic ascites. Musculoskeletal: No acute or significant osseous findings. IMPRESSION: No acute abnormality is identified within the abdomen correspond with the given clinical history. Electronically Signed   By: Violeta Grey M.D.   On: 11/15/2023 21:24   VAS US  LOWER EXTREMITY VENOUS (DVT) (ONLY [facility] & [facility]) Result Date: 11/15/2023  Lower Venous DVT Study Patient Name:  CANDIA KINGSBURY [facility]  Date of Exam:   11/15/2023 Medical Rec #: [id number]                Accession #:    [id number] Date of Birth: 1932/08/28                Patient Gender: F Patient Age:   89  years Exam Location:  [facility] Procedure:      VAS US  LOWER EXTREMITY VENOUS (DVT) Referring Phys: Verdie Gladden DIXON --------------------------------------------------------------------------------  Indications: Pain, and Swelling.  Limitations: Patient's position. Comparison Study: No recent priors. Performing Technologist: Franky Ivanoff Sturdivant-Jones RDMS, RVT  Examination Guidelines: A complete evaluation includes B-mode imaging, spectral Doppler, color Doppler, and power Doppler as needed of all accessible portions of each vessel. Bilateral testing is considered an integral part of a complete examination. Limited examinations for reoccurring indications may be performed as noted. The reflux portion of the exam is performed with the patient in reverse Trendelenburg.  +-----+---------------+---------+-----------+----------+--------------+ RIGHTCompressibilityPhasicitySpontaneityPropertiesThrombus Aging +-----+---------------+---------+-----------+----------+--------------+ CFV  Full           Yes      Yes                                 +-----+---------------+---------+-----------+----------+--------------+ SFJ  Full                                                        +-----+---------------+---------+-----------+----------+--------------+   +---------+---------------+---------+-----------+----------+--------------+ LEFT     CompressibilityPhasicitySpontaneityPropertiesThrombus Aging +---------+---------------+---------+-----------+----------+--------------+ CFV      Full           Yes      Yes                                 +---------+---------------+---------+-----------+----------+--------------+ SFJ      Full                                                        +---------+---------------+---------+-----------+----------+--------------+ FV Prox  Full                                                         +---------+---------------+---------+-----------+----------+--------------+  FV Mid   Full                                                        +---------+---------------+---------+-----------+----------+--------------+ FV DistalFull                                                        +---------+---------------+---------+-----------+----------+--------------+ PFV      Full                                                        +---------+---------------+---------+-----------+----------+--------------+ POP      Full           Yes      Yes                                 +---------+---------------+---------+-----------+----------+--------------+ PTV      Full                                                        +---------+---------------+---------+-----------+----------+--------------+ PERO     Full                                                        +---------+---------------+---------+-----------+----------+--------------+     Summary: RIGHT: - No evidence of common femoral vein obstruction.   LEFT: - There is no evidence of deep vein thrombosis in the lower extremity.  - No cystic structure found in the popliteal fossa.  *See table(s) above for measurements and observations. Electronically signed by Genny Kid MD on 11/15/2023 at 7:08:53 PM.    Final    DG Chest Portable 1 View Result Date: 11/15/2023 CLINICAL DATA:  Seizure EXAM: PORTABLE CHEST 1 VIEW COMPARISON:  10/24/2023 FINDINGS: Heart and mediastinal contours are within normal limits. No focal opacities or effusions. No acute bony abnormality. IMPRESSION: No active disease. Electronically Signed   By: Janeece Mechanic M.D.   On: 11/15/2023 17:24   CT HEAD WO CONTRAST Result Date: 11/15/2023 CLINICAL DATA:  Seizure, new-onset, no history of trauma. EXAM: CT HEAD WITHOUT CONTRAST TECHNIQUE: Contiguous axial images were obtained from the base of the skull through the vertex without intravenous contrast.  RADIATION DOSE REDUCTION: This exam was performed according to the departmental dose-optimization program which includes automated exposure control, adjustment of the mA and/or kV according to patient size and/or use of iterative reconstruction technique. COMPARISON:  Head CT 10/24/2023 and MRI 04/19/2023 FINDINGS: Brain: There is no evidence of an acute infarct, intracranial hemorrhage, mass, midline shift, or extra-axial fluid collection. Patchy hypodensities in the cerebral white matter bilaterally are unchanged and  nonspecific but compatible with moderate chronic small vessel ischemic disease small chronic bilateral cerebellar infarcts are unchanged. Advanced cerebral atrophy is again noted including pronounced bilateral temporal lobe volume loss. Vascular: Calcified atherosclerosis at the skull base. No hyperdense vessel. Skull: No fracture or suspicious lesion. Sinuses/Orbits: Moderate right frontal and right ethmoid sinus opacification, possibly a combination of mucosal thickening and fluid/secretions. Mild mucosal thickening in the right maxillary sinus. Clear mastoid air cells. Bilateral cataract extraction. Other: None. IMPRESSION: 1. No evidence of acute intracranial abnormality. 2. Moderate chronic small vessel ischemic disease and advanced cerebral atrophy. Electronically Signed   By: Aundra Lee M.D.   On: 11/15/2023 16:19        Scheduled Meds:   stroke: early stages of recovery book   Does not apply Once   aspirin  81 mg Oral Daily   clopidogrel  75 mg Oral Daily   feeding supplement  237 mL Oral TID BM   levETIRAcetam  250 mg Intravenous BID   Or   levETIRAcetam  250 mg Oral BID   metoprolol  succinate  50 mg Oral Daily   rosuvastatin  20 mg Oral Daily   sodium chloride  flush  10-40 mL Intracatheter Q12H   Continuous Infusions:   LOS: 2 days    Time spent: 52 minutes    Vada Garibaldi, MD [facility]

## 2023-11-17 NOTE — Plan of Care (Signed)
 Problem: Education: Goal: Knowledge of disease or condition will improve 11/17/2023 0420 by Channie Comings, RN Outcome: Progressing 11/17/2023 0419 by Channie Comings, RN Outcome: Progressing Goal: Knowledge of secondary prevention will improve (MUST DOCUMENT ALL) 11/17/2023 0420 by Channie Comings, RN Outcome: Progressing 11/17/2023 0419 by Channie Comings, RN Outcome: Progressing Goal: Knowledge of patient specific risk factors will improve (DELETE if not current risk factor) 11/17/2023 0420 by Channie Comings, RN Outcome: Progressing 11/17/2023 0419 by Channie Comings, RN Outcome: Progressing   Problem: Ischemic Stroke/TIA Tissue Perfusion: Goal: Complications of ischemic stroke/TIA will be minimized 11/17/2023 0420 by Channie Comings, RN Outcome: Progressing 11/17/2023 0419 by Channie Comings, RN Outcome: Progressing   Problem: Coping: Goal: Will verbalize positive feelings about self 11/17/2023 0420 by Channie Comings, RN Outcome: Progressing 11/17/2023 0419 by Channie Comings, RN Outcome: Progressing Goal: Will identify appropriate support needs 11/17/2023 0420 by Channie Comings, RN Outcome: Progressing 11/17/2023 0419 by Channie Comings, RN Outcome: Progressing   Problem: Health Behavior/Discharge Planning: Goal: Ability to manage health-related needs will improve 11/17/2023 0420 by Channie Comings, RN Outcome: Progressing 11/17/2023 0419 by Channie Comings, RN Outcome: Progressing Goal: Goals will be collaboratively established with patient/family 11/17/2023 0420 by Channie Comings, RN Outcome: Progressing 11/17/2023 0419 by Channie Comings, RN Outcome: Progressing   Problem: Self-Care: Goal: Ability to participate in self-care as condition permits will improve 11/17/2023 0420 by Channie Comings, RN Outcome: Progressing 11/17/2023 0419 by Channie Comings, RN Outcome: Progressing Goal: Verbalization of feelings and  concerns over difficulty with self-care will improve 11/17/2023 0420 by Channie Comings, RN Outcome: Progressing 11/17/2023 0419 by Channie Comings, RN Outcome: Progressing Goal: Ability to communicate needs accurately will improve 11/17/2023 0420 by Channie Comings, RN Outcome: Progressing 11/17/2023 0419 by Channie Comings, RN Outcome: Progressing   Problem: Nutrition: Goal: Risk of aspiration will decrease 11/17/2023 0420 by Channie Comings, RN Outcome: Progressing 11/17/2023 0419 by Channie Comings, RN Outcome: Progressing Goal: Dietary intake will improve 11/17/2023 0420 by Channie Comings, RN Outcome: Progressing 11/17/2023 0419 by Channie Comings, RN Outcome: Progressing   Problem: Education: Goal: Knowledge of General Education information will improve Description: Including pain rating scale, medication(s)/side effects and non-pharmacologic comfort measures 11/17/2023 0420 by Channie Comings, RN Outcome: Progressing 11/17/2023 0419 by Channie Comings, RN Outcome: Progressing   Problem: Health Behavior/Discharge Planning: Goal: Ability to manage health-related needs will improve 11/17/2023 0420 by Channie Comings, RN Outcome: Progressing 11/17/2023 0419 by Channie Comings, RN Outcome: Progressing   Problem: Clinical Measurements: Goal: Ability to maintain clinical measurements within normal limits will improve 11/17/2023 0420 by Channie Comings, RN Outcome: Progressing 11/17/2023 0419 by Channie Comings, RN Outcome: Progressing Goal: Will remain free from infection 11/17/2023 0420 by Channie Comings, RN Outcome: Progressing 11/17/2023 0419 by Channie Comings, RN Outcome: Progressing Goal: Diagnostic test results will improve 11/17/2023 0420 by Channie Comings, RN Outcome: Progressing 11/17/2023 0419 by Channie Comings, RN Outcome: Progressing Goal: Respiratory complications will improve 11/17/2023 0420 by Channie Comings, RN Outcome: Progressing 11/17/2023 0419 by Channie Comings, RN Outcome: Progressing Goal: Cardiovascular complication will be avoided 11/17/2023 0420 by Channie Comings, RN Outcome: Progressing 11/17/2023 0419 by Channie Comings, RN Outcome: Progressing   Problem: Activity: Goal: Risk for activity intolerance will decrease 11/17/2023 0420 by Channie Comings, RN Outcome: Progressing 11/17/2023 0419 by Channie Comings, RN Outcome: Progressing   Problem: Nutrition: Goal: Adequate nutrition will be maintained 11/17/2023 0420 by Channie Comings, RN Outcome: Progressing 11/17/2023 0419 by Channie Comings, RN Outcome: Progressing   Problem: Coping: Goal: Level of anxiety will decrease  11/17/2023 0420 by Channie Comings, RN Outcome: Progressing 11/17/2023 0419 by Channie Comings, RN Outcome: Progressing   Problem: Elimination: Goal: Will not experience complications related to bowel motility 11/17/2023 0420 by Channie Comings, RN Outcome: Progressing 11/17/2023 0419 by Channie Comings, RN Outcome: Progressing Goal: Will not experience complications related to urinary retention 11/17/2023 0420 by Channie Comings, RN Outcome: Progressing 11/17/2023 0419 by Channie Comings, RN Outcome: Progressing   Problem: Pain Managment: Goal: General experience of comfort will improve and/or be controlled 11/17/2023 0420 by Channie Comings, RN Outcome: Progressing 11/17/2023 0419 by Channie Comings, RN Outcome: Progressing   Problem: Safety: Goal: Ability to remain free from injury will improve 11/17/2023 0420 by Channie Comings, RN Outcome: Progressing 11/17/2023 0419 by Channie Comings, RN Outcome: Progressing   Problem: Skin Integrity: Goal: Risk for impaired skin integrity will decrease 11/17/2023 0420 by Channie Comings, RN Outcome: Progressing 11/17/2023 0419 by Channie Comings, RN Outcome: Progressing

## 2023-11-17 NOTE — Plan of Care (Signed)
   Problem: Health Behavior/Discharge Planning: Goal: Ability to manage health-related needs will improve Outcome: Progressing

## 2023-11-17 NOTE — Plan of Care (Signed)

## 2023-11-17 NOTE — Progress Notes (Signed)
 Speech Language Pathology Treatment: Dysphagia  Patient Details Name: Brenda Hammond MRN: [id number] DOB: 31-Oct-1932 Today's Date: 11/17/2023 Time: 4132-4401 SLP Time Calculation (min) (ACUTE ONLY): 13 min  Assessment / Plan / Recommendation Clinical Impression  Pt remains quite confused today but was able to attend to functional task of eating. She accepted a few bites of regular solids and sips of thin liquids without overt s/s of dysphagia or aspiration. It is difficult to challenge her with larger bolus sizes but independently initiated sequential sips appear WFL. Recommend continuing regular diet with thin liquids. Provide finger foods when able to promote self-feeding. No further SLP f/u is needed acutely, will sign off.    HPI HPI: BARBARAJEAN Hammond is an 89 yo female presenting to ED 5/20 from [facility] dementia unit after having a seizure with R gaze deviation. MRI shows mild diffusion signal abnormality involving the subcortical R frontal operculum, likely evolving subacute ischemia. Recently admitted for encephalopathy 2/2 COVID infection. PMH includes A-fib, GERD, arthritis, fibromyalgia, anemia, colon cancer, dementia, HTN      SLP Plan  All goals met      Recommendations for follow up therapy are one component of a multi-disciplinary discharge planning process, led by the attending physician.  Recommendations may be updated based on patient status, additional functional criteria and insurance authorization.    Recommendations  Diet recommendations: Regular;Thin liquid Liquids provided via: Cup;Straw Medication Administration: Whole meds with puree Supervision: Staff to assist with self feeding;Full supervision/cueing for compensatory strategies;Trained caregiver to feed patient Compensations: Minimize environmental distractions;Slow rate;Small sips/bites Postural Changes and/or Swallow Maneuvers: Seated upright 90 degrees                  Oral care BID    Frequent or constant Supervision/Assistance Dysphagia, unspecified (R13.10)     All goals met     Amil Kale, M.A., CCC-SLP Speech Language Pathology, Acute Rehabilitation Services  Secure Chat preferred [phone]   11/17/2023, 1:52 PM

## 2023-11-17 NOTE — Progress Notes (Signed)
 SLP Cancellation Note  Patient Details Name: Brenda Hammond MRN: [id number] DOB: Feb 04, 1933   Cancelled treatment:       Reason Eval/Treat Not Completed: SLP screened. Pt has a history of advanced dementia and appears to be at her baseline with no apparent acute changes in language or speech. Recommend f/u with HH once she returns to a familiar environment if warranted. No needs identified, will sign off.   Amil Kale, M.A., CCC-SLP Speech Language Pathology, Acute Rehabilitation Services  Secure Chat preferred [phone]  11/17/2023, 2:27 PM

## 2023-11-17 NOTE — Evaluation (Signed)
 Occupational Therapy Evaluation Patient Details Name: Brenda Hammond MRN: [id number] DOB: Oct 30, 1932 Today's Date: 11/17/2023   History of Present Illness   89 y.o. female presents to [facility] 11/15/23 from [place] dementia unit due to having a seizure w/ R gaze deviation for 1 minute. Head CT negative for acute findings. Recent admit for encephalopathy 2/2 Covid-19. PMHx:  atrial fibrillation, GERD, arthritis, fibromyalgia, HLD, anemia, colon cancer, dementia, HTN     Clinical Impressions Pt admitted for above, she presents as very confused and demonstrates impaired topographical orientation as well. She is generally weak, needing mod A for standing assist and min A + RW to ambulate around room. Per chart review pt has assist with ADLs from her ALF, currently she needs Max A to CGA for her ADLs and needs multi modal cues for safety and sequencing. OT to continue following pt acutely to address listed deficits and help transition to next level of care. Patient would benefit from post acute Home OT services to help maximize functional independence in natural environment as long as pt has listed level of support but if not then going to skilled rehab <3hrs of therapy would be a more suitable option.      If plan is discharge home, recommend the following:   A lot of help with walking and/or transfers;A lot of help with bathing/dressing/bathroom;Supervision due to cognitive status;Direct supervision/assist for medications management     Functional Status Assessment   Patient has had a recent decline in their functional status and demonstrates the ability to make significant improvements in function in a reasonable and predictable amount of time.     Equipment Recommendations   Other (comment) (defer to next level of care)     Recommendations for Other Services         Precautions/Restrictions   Precautions Precautions: Fall Recall of Precautions/Restrictions:  Impaired Precaution/Restrictions Comments: Hx Dementia Restrictions Weight Bearing Restrictions Per Provider Order: No     Mobility Bed Mobility Overal bed mobility: Needs Assistance Bed Mobility: Supine to Sit, Sit to Supine     Supine to sit: Max assist Sit to supine: Max assist   General bed mobility comments: Pt not initiating much bed mobility    Transfers Overall transfer level: Needs assistance Equipment used: Rolling walker (2 wheels) Transfers: Sit to/from Stand Sit to Stand: Mod assist           General transfer comment: mod A STS from EOB      Balance Overall balance assessment: Needs assistance Sitting-balance support: Feet unsupported, Bilateral upper extremity supported Sitting balance-Leahy Scale: Fair Sitting balance - Comments: CGA sitting EOB   Standing balance support: Bilateral upper extremity supported, During functional activity Standing balance-Leahy Scale: Poor Standing balance comment: RW support                           ADL either performed or assessed with clinical judgement   ADL Overall ADL's : Needs assistance/impaired Eating/Feeding: Bed level;Set up   Grooming: Sitting;Contact guard assist;Cueing for sequencing   Upper Body Bathing: Sitting;Minimal assistance   Lower Body Bathing: Sitting/lateral leans;Moderate assistance   Upper Body Dressing : Sitting;Moderate assistance   Lower Body Dressing: Moderate assistance;Sitting/lateral leans Lower Body Dressing Details (indicate cue type and reason): don socks via backwards chaining. Toilet Transfer: Minimal assistance;Rolling walker (2 wheels);Ambulation   Toileting- Clothing Manipulation and Hygiene: Maximal assistance       Functional mobility during ADLs: Minimal assistance;Rolling walker (2  wheels)       Vision   Additional Comments: To be further assessed     Perception         Praxis         Pertinent Vitals/Pain Pain Assessment Pain  Assessment: Faces Faces Pain Scale: Hurts little more Pain Location: L knee with bed mobility Pain Descriptors / Indicators: Grimacing, Guarding, Discomfort Pain Intervention(s): Monitored during session     Extremity/Trunk Assessment Upper Extremity Assessment Upper Extremity Assessment: Generalized weakness;Difficult to assess due to impaired cognition   Lower Extremity Assessment Lower Extremity Assessment: Generalized weakness;Difficult to assess due to impaired cognition   Cervical / Trunk Assessment Cervical / Trunk Assessment: Kyphotic   Communication Communication Communication: No apparent difficulties   Cognition Arousal: Lethargic Behavior During Therapy: Flat affect Cognition: History of cognitive impairments, Cognition impaired     Awareness: Intellectual awareness impaired, Online awareness impaired Memory impairment (select all impairments): Declarative long-term memory, Short-term memory, Working memory Attention impairment (select first level of impairment): Focused attention, Sustained attention, Selective attention Executive functioning impairment (select all impairments): Problem solving, Organization, Initiation, Sequencing, Reasoning OT - Cognition Comments: Pt continuously saying "Lord have mercy, George Kinder Jesus help me" throughout session, stated that she had to  use the restroom then was assisted to the bathroom and despite cues she began to state the the toilet does not look like a toilet, continued to reassure pt that we were at the restroom and she just needed to step inside however pt then began to say that the room across the hall looks like the right place just before urinating on the floor. Pt with very little initiation of mobility and impaired ability to sequence tasks.                 Following commands: Impaired Following commands impaired: Follows one step commands inconsistently, Follows one step commands with increased time      Cueing  General Comments   Cueing Techniques: Verbal cues;Gestural cues;Tactile cues      Exercises     Shoulder Instructions      Home Living Family/patient expects to be discharged to:: Assisted living                             Home Equipment: Jeananne Mighty - single point   Additional Comments: [place] [facility] [employer]      Prior Functioning/Environment Prior Level of Function : Needs assist             Mobility Comments: ModI with SP cane ADLs Comments: assist from staff for bathing, dressing.    OT Problem List: Decreased strength;Impaired balance (sitting and/or standing);Decreased safety awareness;Decreased cognition   OT Treatment/Interventions: Self-care/ADL training;Therapeutic exercise;Therapeutic activities;Energy conservation;Patient/family education;DME and/or AE instruction;Balance training      OT Goals(Current goals can be found in the care plan section)   Acute Rehab OT Goals OT Goal Formulation: Patient unable to participate in goal setting Time For Goal Achievement: 12/01/23 Potential to Achieve Goals: Fair ADL Goals Pt Will Perform Grooming: standing;with set-up;with supervision Pt Will Perform Upper Body Dressing: with set-up;sitting Pt Will Perform Lower Body Dressing: sitting/lateral leans;with min assist Pt Will Transfer to Toilet: ambulating;with supervision Additional ADL Goal #1: pt will appropriately sequence 3/3 ADL tasks when OOB with min verbal cues Additional ADL Goal #2: Pt will navigate her way around her room to get to necessary area for desired ADL task without cues + CGA   OT  Frequency:  Min 2X/week    Co-evaluation              AM-PAC OT "6 Clicks" Daily Activity     Outcome Measure Help from another person eating meals?: A Little Help from another person taking care of personal grooming?: A Little Help from another person toileting, which includes using toliet, bedpan, or urinal?: A Lot Help from another  person bathing (including washing, rinsing, drying)?: A Lot Help from another person to put on and taking off regular upper body clothing?: A Lot Help from another person to put on and taking off regular lower body clothing?: A Lot 6 Click Score: 14   End of Session Equipment Utilized During Treatment: Gait belt;Rolling walker (2 wheels) Nurse Communication: Mobility status  Activity Tolerance: Patient tolerated treatment well Patient left: in bed;with call bell/phone within reach;with bed alarm set  OT Visit Diagnosis: Other symptoms and signs involving cognitive function;Other abnormalities of gait and mobility (R26.89)                Time: 1610-9604 OT Time Calculation (min): 31 min Charges:  OT General Charges $OT Visit: 1 Visit OT Evaluation $OT Eval Moderate Complexity: 1 Mod OT Treatments $Therapeutic Activity: 8-22 mins  11/17/2023  AB, OTR/L  Acute Rehabilitation Services  Office: [phone]   Jorene New 11/17/2023, 3:33 PM

## 2023-11-17 NOTE — Progress Notes (Signed)
 PHARMACY - ANTICOAGULATION CONSULT NOTE  Pharmacy Consult for heparin Indication: atrial fibrillation in setting of subacute CVA   Labs: Recent Labs    11/15/23 1359 11/16/23 0556 11/16/23 1153 11/16/23 2058 11/17/23 0527  HGB 13.2 12.2  --   --  11.7*  HCT 41.0 37.6  --   --  33.7*  PLT 201 197  --   --  218  HEPARINUNFRC  --   --  0.29* 0.56 0.71*  CREATININE 0.89 0.82  --   --   --    Assessment: 89yo female supratherapeutic on heparin with higher heparin level despite increased rate; no infusion issues or signs of bleeding per RN.  Goal of Therapy:  Heparin level 0.3-0.5 units/ml   Plan:  Decrease heparin infusion by 2 units/kg/hr to 600 units/hr. Check level in 8 hours.   Lonnie Roberts, PharmD, BCPS 11/17/2023 6:44 AM

## 2023-11-17 NOTE — Progress Notes (Addendum)
 STROKE TEAM PROGRESS NOTE   INTERIM HISTORY/SUBJECTIVE No family at the bedside.  Patient has a Comptroller at the bedside. Is awake alert in no apparent distress Neurological exam remains the same  CBC    Component Value Date/Time   WBC 5.7 11/17/2023 0527   RBC 3.83 (L) 11/17/2023 0527   HGB 11.7 (L) 11/17/2023 0527   HCT 33.7 (L) 11/17/2023 0527   PLT 218 11/17/2023 0527   MCV 88.0 11/17/2023 0527   MCH 30.5 11/17/2023 0527   MCHC 34.7 11/17/2023 0527   RDW 13.3 11/17/2023 0527   LYMPHSABS 1.0 11/15/2023 1359   MONOABS 0.7 11/15/2023 1359   EOSABS 0.1 11/15/2023 1359   BASOSABS 0.0 11/15/2023 1359    BMET    Component Value Date/Time   NA 136 11/16/2023 0556   NA 141 01/05/2019 1445   K 3.7 11/16/2023 0556   CL 104 11/16/2023 0556   CO2 23 11/16/2023 0556   GLUCOSE 82 11/16/2023 0556   BUN 11 11/16/2023 0556   BUN 16 01/05/2019 1445   CREATININE 0.82 11/16/2023 0556   CREATININE 0.91 (H) 07/25/2020 1631   CALCIUM  8.1 (L) 11/16/2023 0556   GFRNONAA >60 11/16/2023 0556   GFRNONAA 71 11/26/2015 1052    IMAGING past 24 hours ECHOCARDIOGRAM COMPLETE Result Date: 11/16/2023    ECHOCARDIOGRAM REPORT   Patient Name:   Brenda Hammond Date of Exam: 11/16/2023 Medical Rec #:  [id number]               Height:       61.0 in Accession #:    [id number]              Weight:       118.2 lb Date of Birth:  11/03/1932               BSA:          1.510 m Patient Age:    89 years                BP:           144/66 mmHg Patient Gender: F                       HR:           61 bpm. Exam Location:  Inpatient Procedure: 2D Echo, Cardiac Doppler and Color Doppler (Both Spectral and Color            Flow Doppler were utilized during procedure). Indications:    Stroke  History:        Patient has prior history of Echocardiogram examinations, most                 recent 04/19/2023.  Sonographer:    Janette Medley Referring Phys: CAROLE N HALL IMPRESSIONS  1. Left ventricular ejection fraction, by  estimation, is 60 to 65%. The left ventricle has normal function. The left ventricle has no regional wall motion abnormalities. Left ventricular diastolic parameters are consistent with Grade I diastolic dysfunction (impaired relaxation).  2. Right ventricular systolic function is normal. The right ventricular size is normal. There is normal pulmonary artery systolic pressure.  3. The mitral valve is normal in structure. Mild mitral valve regurgitation. No evidence of mitral stenosis.  4. The aortic valve is normal in structure. Aortic valve regurgitation is not visualized. No aortic stenosis is present.  5. The inferior vena cava is normal in size with greater  than 50% respiratory variability, suggesting right atrial pressure of 3 mmHg. FINDINGS  Left Ventricle: Left ventricular ejection fraction, by estimation, is 60 to 65%. The left ventricle has normal function. The left ventricle has no regional wall motion abnormalities. The left ventricular internal cavity size was normal in size. There is  no left ventricular hypertrophy. Left ventricular diastolic parameters are consistent with Grade I diastolic dysfunction (impaired relaxation). Indeterminate filling pressures. Right Ventricle: The right ventricular size is normal. No increase in right ventricular wall thickness. Right ventricular systolic function is normal. There is normal pulmonary artery systolic pressure. The tricuspid regurgitant velocity is 2.48 m/s, and  with an assumed right atrial pressure of 3 mmHg, the estimated right ventricular systolic pressure is 27.6 mmHg. Left Atrium: Left atrial size was normal in size. Right Atrium: Right atrial size was normal in size. Pericardium: There is no evidence of pericardial effusion. Mitral Valve: The mitral valve is normal in structure. Mild mitral valve regurgitation. No evidence of mitral valve stenosis. Tricuspid Valve: The tricuspid valve is normal in structure. Tricuspid valve regurgitation is mild . No  evidence of tricuspid stenosis. Aortic Valve: The aortic valve is normal in structure. Aortic valve regurgitation is not visualized. No aortic stenosis is present. Aortic valve mean gradient measures 3.0 mmHg. Aortic valve peak gradient measures 6.8 mmHg. Aortic valve area, by VTI measures 1.84 cm. Pulmonic Valve: The pulmonic valve was normal in structure. Pulmonic valve regurgitation is not visualized. No evidence of pulmonic stenosis. Aorta: The aortic root is normal in size and structure. Venous: The inferior vena cava is normal in size with greater than 50% respiratory variability, suggesting right atrial pressure of 3 mmHg. IAS/Shunts: No atrial level shunt detected by color flow Doppler.  LEFT VENTRICLE PLAX 2D LVIDd:         4.60 cm   Diastology LVIDs:         2.80 cm   LV e' medial:    6.64 cm/s LV PW:         0.90 cm   LV E/e' medial:  13.3 LV IVS:        1.00 cm   LV e' lateral:   7.72 cm/s LVOT diam:     1.80 cm   LV E/e' lateral: 11.5 LV SV:         51 LV SV Index:   34 LVOT Area:     2.54 cm  RIGHT VENTRICLE             IVC RV S prime:     20.30 cm/s  IVC diam: 1.70 cm TAPSE (M-mode): 1.7 cm LEFT ATRIUM             Index        RIGHT ATRIUM           Index LA diam:        3.10 cm 2.05 cm/m   RA Area:     13.10 cm LA Vol (A2C):   27.5 ml 18.21 ml/m  RA Volume:   29.30 ml  19.40 ml/m LA Vol (A4C):   30.6 ml 20.26 ml/m LA Biplane Vol: 29.4 ml 19.47 ml/m  AORTIC VALVE AV Area (Vmax):    1.72 cm AV Area (Vmean):   1.64 cm AV Area (VTI):     1.84 cm AV Vmax:           130.00 cm/s AV Vmean:          85.600 cm/s AV VTI:  0.279 m AV Peak Grad:      6.8 mmHg AV Mean Grad:      3.0 mmHg LVOT Vmax:         88.00 cm/s LVOT Vmean:        55.000 cm/s LVOT VTI:          0.202 m LVOT/AV VTI ratio: 0.72  AORTA Ao Root diam: 2.40 cm Ao Asc diam:  2.70 cm MITRAL VALVE                TRICUSPID VALVE MV Area (PHT): 3.08 cm     TR Peak grad:   24.6 mmHg MV Decel Time: 246 msec     TR Vmax:        248.00  cm/s MV E velocity: 88.60 cm/s MV A velocity: 107.00 cm/s  SHUNTS MV E/A ratio:  0.83         Systemic VTI:  0.20 m                             Systemic Diam: 1.80 cm Maudine Sos MD Electronically signed by Maudine Sos MD Signature Date/Time: 11/16/2023/3:57:30 PM    Final     Vitals:   11/17/23 0010 11/17/23 0413 11/17/23 1610 11/17/23 0827  BP: (!) 140/99 122/86  (!) 127/56  Pulse: 69 66  62  Resp: 16 16  20   Temp: 97.8 F (36.6 C) 97.6 F (36.4 C)  98.4 F (36.9 C)  TempSrc: Axillary Axillary  Oral  SpO2: 100% 100%  99%  Weight:   56.5 kg   Height:         PHYSICAL EXAM General: Chronically ill elderly female Psych:  Mood and affect appropriate for situation CV: Regular rate and rhythm on monitor Respiratory:  Regular, unlabored respirations on room air GI: Abdomen soft and nontender   NEURO:  Mental Status: Patient awake and alert.  She is confused, not able to answer any orientation questions, can follow simple commands  Cranial Nerves:  II: PERRL. Visual fields full.  III, IV, VI: EOMI. Eyelids elevate symmetrically.  V: Sensation is intact to light touch and symmetrical to face.  VII: Face is symmetrical resting and smiling VIII: hearing intact to voice. IX, X: Palate elevates symmetrically. Phonation is normal.  RU:[id number] shrug 5/5. XII: tongue is midline without fasciculations. Motor: All 4 extremities with paratonia Tone: is normal and bulk is normal Sensation- Intact to light touch bilaterally. Extinction absent to light touch to DSS.   Coordination: FTN intact bilaterally, HKS: no ataxia in BLE.No drift.  Gait- deferred  Most Recent NIH 3   ASSESSMENT/PLAN  Brenda Hammond is a 89 y.o. female with history of advanced dementia, paroxysmal atrial fibrillation not on anticoagulation secondary to frequent falls, hypertension, admitted for evaluation of possible seizure-like activity with right gaze preference and tremors lasting 1  minute NIH on Admission 11  Subacute ischemic stroke-left frontal Etiology: Cardioembolic in the setting of A-fib not on anticoagulation CT head No acute abnormality. Small vessel disease. Atrophy. CTA head & neck no LVO MRI  1 cm focus of mild diffusion signal abnormality involving the subcortical right frontal operculum, 2D Echo EF 60 to 65%.  LV with grade 1 diastolic dysfunction LDL 96 HgbA1c 5.4 VTE prophylaxis -SCDs No antithrombotic prior to admission, now on aspirin 81 mg daily and clopidogrel 75 mg daily for 3 months then Plavix alone Therapy recommendations:  Pending Disposition: Pending  Concern for seizures Keppra 250 mg twice daily EEG This study is suggestive of mild to moderate diffuse encephalopathy. No seizures or epileptiform discharges were seen throughout the recording.  Seizure precautions  Advanced dementia Last seen by [facility] in 2021  Atrial fibrillation Home Meds: None due to falls and advanced dementia Continue telemetry monitoring Heparin IV.  If patient is not placed on DOAC would recommend aspirin 1 mg and Plavix 75 mg daily for 3 months then Plavix alone  Hypertension Home meds: Toprol  50 mg Stable Blood Pressure Goal: SBP less than 160   Hyperlipidemia Home meds: None LDL 96, goal < 70 Add Crestor 20 mg Continue statin at discharge   Dysphagia Patient has post-stroke dysphagia, SLP consulted    Diet   Diet regular Room service appropriate? Yes; Fluid consistency: Thin   Advance diet as tolerated   Hospital day # 2   Jonette Nestle DNP, ACNPC-AG  [facility]  I have personally obtained history,examined this patient, reviewed notes, independently viewed imaging studies, participated in medical decision making and plan of care.ROS completed by me personally and pertinent positives fully documented  I have made any additions or clarifications directly to the above note. Agree with note above.  Patient with 6 dementia at baseline  is not a good long-term anticoagulation candidate presented with confusion and embolic stroke.  Continue aspirin Plavix for 3 weeks and then Plavix alone.  Continue Keppra for seizures.  Stroke team will sign off.  Kindly call for questions.  Ardella Beaver, MD Medical Director [facility] Pager: [phone] 11/17/2023 1:04 PM  To contact Stroke Continuity provider, please refer to [email or web address]. After hours, contact General Neurology

## 2023-11-18 DIAGNOSIS — R569 Unspecified convulsions: Secondary | ICD-10-CM | POA: Diagnosis not present

## 2023-11-18 MED ORDER — ROSUVASTATIN CALCIUM 20 MG PO TABS: 20.0000 mg | ORAL_TABLET | Freq: Every day | ORAL | 0 refills | Status: DC

## 2023-11-18 MED ORDER — ASPIRIN 81 MG PO CHEW: 81.0000 mg | CHEWABLE_TABLET | Freq: Every day | ORAL | 0 refills | Status: AC

## 2023-11-18 MED ORDER — ROSUVASTATIN CALCIUM 20 MG PO TABS: 20.0000 mg | ORAL_TABLET | Freq: Every day | ORAL | 0 refills | Status: AC

## 2023-11-18 MED ORDER — CLOPIDOGREL BISULFATE 75 MG PO TABS: 75.0000 mg | ORAL_TABLET | Freq: Every day | ORAL | 3 refills | Status: AC

## 2023-11-18 MED ORDER — LEVETIRACETAM 250 MG PO TABS: 250.0000 mg | ORAL_TABLET | Freq: Two times a day (BID) | ORAL | 0 refills | Status: DC

## 2023-11-18 MED ORDER — CLOPIDOGREL BISULFATE 75 MG PO TABS: 75.0000 mg | ORAL_TABLET | Freq: Every day | ORAL | 0 refills | Status: DC

## 2023-11-18 MED ORDER — ASPIRIN 81 MG PO CHEW: 81.0000 mg | CHEWABLE_TABLET | Freq: Every day | ORAL | 0 refills | Status: DC

## 2023-11-18 MED ORDER — LEVETIRACETAM 250 MG PO TABS: 250.0000 mg | ORAL_TABLET | Freq: Two times a day (BID) | ORAL | 0 refills | Status: AC

## 2023-11-18 NOTE — Progress Notes (Signed)
 Physical Therapy Treatment Patient Details Name: Brenda Hammond MRN: [id number] DOB: 12/09/1932 Today's Date: 11/18/2023   History of Present Illness 89 y.o. female presents to [facility] 11/15/23 from [facility] dementia unit due to having a seizure w/ R gaze deviation for 1 minute. Head CT negative for acute findings. Recent admit for encephalopathy 2/2 Covid-19. PMHx:  atrial fibrillation, GERD, arthritis, fibromyalgia, HLD, anemia, colon cancer, dementia, HTN    PT Comments  Pt is progressing well towards goals. Pt was Mod A for bed mobility and Min A for sit to stand/short distance gait with RW this session. Pt lives in a memory care ALF and would benefit from continued skilled physical therapy services in this venue. Due to pt current functional status, home set up and available assistance at home recommending skilled physical therapy services 3x/week in order to address strength, balance and functional mobility to decrease risk for falls, injury and re-hospitalization.       If plan is discharge home, recommend the following: Assist for transportation;Supervision due to cognitive status;A lot of help with walking and/or transfers;Assistance with cooking/housework     Equipment Recommendations  Rolling walker (2 wheels)       Precautions / Restrictions Precautions Precautions: Fall Recall of Precautions/Restrictions: Impaired Precaution/Restrictions Comments: Hx Dementia Restrictions Weight Bearing Restrictions Per Provider Order: No     Mobility  Bed Mobility Overal bed mobility: Needs Assistance Bed Mobility: Supine to Sit, Sit to Supine     Supine to sit: Mod assist Sit to supine: Mod assist   General bed mobility comments: Mod a at trunk to midline and LE to bed with physical assist for initiation    Transfers Overall transfer level: Needs assistance Equipment used: Rolling walker (2 wheels) Transfers: Sit to/from Stand Sit to Stand: Min assist            General transfer comment: Min A for momentum to get to standing. Verbal cues for safe hand placement.    Ambulation/Gait Ambulation/Gait assistance: Contact guard assist Gait Distance (Feet): 30 Feet Assistive device: Rolling walker (2 wheels) Gait Pattern/deviations: Step-through pattern, Decreased stride length, Wide base of support Gait velocity: decreased Gait velocity interpretation: <1.31 ft/sec, indicative of household ambulator   General Gait Details: mild instability with turns and VC's on proper walker to self distance as Pt tends to push too far front.      Balance Overall balance assessment: Needs assistance Sitting-balance support: Feet unsupported, Bilateral upper extremity supported Sitting balance-Leahy Scale: Fair     Standing balance support: Bilateral upper extremity supported, During functional activity, Reliant on assistive device for balance Standing balance-Leahy Scale: Fair Standing balance comment: RW support      Communication Communication Communication: No apparent difficulties  Cognition Arousal: Alert Behavior During Therapy: Flat affect   PT - Cognitive impairments: History of cognitive impairments     PT - Cognition Comments: Pt resides at Memory Care Unit at ALF Following commands: Impaired Following commands impaired: Follows one step commands with increased time, Follows multi-step commands inconsistently    Cueing Cueing Techniques: Verbal cues, Gestural cues, Tactile cues, Visual cues         Pertinent Vitals/Pain Pain Assessment Pain Assessment: Faces Faces Pain Scale: No hurt Breathing: normal Negative Vocalization: none Facial Expression: smiling or inexpressive Body Language: relaxed Consolability: no need to console PAINAD Score: 0 Pain Intervention(s): Monitored during session           PT Goals (current goals can now be found in the care plan  section) Acute Rehab PT Goals Patient Stated Goal: to go back to  ALF PT Goal Formulation: With family Time For Goal Achievement: 11/30/23 Potential to Achieve Goals: Fair Progress towards PT goals: Progressing toward goals    Frequency    Min 2X/week      PT Plan  Continue with current POC        AM-PAC PT "6 Clicks" Mobility   Outcome Measure  Help needed turning from your back to your side while in a flat bed without using bedrails?: A Lot Help needed moving from lying on your back to sitting on the side of a flat bed without using bedrails?: A Lot Help needed moving to and from a bed to a chair (including a wheelchair)?: A Little Help needed standing up from a chair using your arms (e.g., wheelchair or bedside chair)?: A Little Help needed to walk in hospital room?: A Little Help needed climbing 3-5 steps with a railing? : A Lot 6 Click Score: 15    End of Session Equipment Utilized During Treatment: Gait belt Activity Tolerance: Patient tolerated treatment well Patient left: in bed;with call bell/phone within reach;with bed alarm set Nurse Communication: Mobility status PT Visit Diagnosis: Other abnormalities of gait and mobility (R26.89);Repeated falls (R29.6);Muscle weakness (generalized) (M62.81);Difficulty in walking, not elsewhere classified (R26.2)     Time: 6606-3016 PT Time Calculation (min) (ACUTE ONLY): 13 min  Charges:    $Therapeutic Activity: 8-22 mins PT General Charges $$ ACUTE PT VISIT: 1 Visit                    Sloan Duncans, DPT, CLT  Acute Rehabilitation Services Office: [phone] (Secure chat preferred)    Jenice Mitts 11/18/2023, 2:01 PM

## 2023-11-18 NOTE — Care Management Important Message (Signed)
 Important Message  Patient Details  Name: Brenda Hammond MRN: [id number] Date of Birth: Jun 14, 1933   Important Message Given:  Yes - Medicare IM     Wynonia Hedges 11/18/2023, 4:01 PM

## 2023-11-18 NOTE — TOC Transition Note (Signed)
 Transition of Care [facility]) - Discharge Note   Patient Details  Name: Brenda Hammond MRN: [id number] Date of Birth: 01-01-1933  [facility]) CM/SW Contact:  Tandy Fam, LCSW Phone Number: 11/18/2023, 12:47 PM   Clinical Narrative:   Patient from [facility] memory care, stable to return today. CSW contacted [facility], spoke with Lamond Pilot about patient returning. Discharge information faxed and confirmed receipt. [facility] asked for prescriptions to be faxed to [facility], CSW relayed message to MD. CSW updated [facility] about discharge today, they will follow for home health. CSW spoke with daughter, Prentice Brochure, she will provide transport. Melanie asked about rollator that was ordered on recent admission but never delivered. CSW contacted [employer] to ask about rollator, they misplaced the order but will get it delivered for the patient. CSW updated daughter, she was appreciative of assistance. No further TOC needs.  Nurse to call report to [phone]    Final next level of care: Memory Care Barriers to Discharge: Barriers Resolved   Patient Goals and CMS Choice            Discharge Placement              Patient chooses bed at:  [facility]) Patient to be transferred to facility by: Family Name of family member notified: Melanie Patient and family notified of of transfer: 11/18/23  Discharge Plan and Services Additional resources added to the After Visit Summary for                            [facility] Arranged: PT, OT HH Agency: [facility] Date [facility] Agency Contacted: 11/18/23   Representative spoke with at [facility] Agency: Angie  Social Drivers of Health (SDOH) Interventions SDOH Screenings   Food Insecurity: No Food Insecurity (11/17/2023)  Housing: Low Risk  (11/17/2023)  Transportation Needs: No Transportation Needs (11/17/2023)  Utilities: Not At Risk (11/17/2023)  Depression (PHQ2-9): Low Risk  (10/13/2021)  Financial  Resource Strain: Low Risk  (10/24/2019)  Social Connections: Moderately Isolated (11/17/2023)  Tobacco Use: Low Risk  (11/15/2023)     Readmission Risk Interventions     No data to display

## 2023-11-18 NOTE — NC FL2 (Signed)
 [place]  MEDICAID FL2 LEVEL OF CARE FORM     IDENTIFICATION  Patient Name: Brenda Hammond Birthdate: 02-05-1933 Sex: female Admission Date (Current Location): 11/15/2023  [facility] and [place] Number:  Producer, television/film/video and Address:  [facility], [address] [place], [place] [address]      Provider Number: [id number]  Attending Physician Name and Address:  Vada Garibaldi, MD  Relative Name and Phone Number:       Current Level of Care: Hospital Recommended Level of Care: Memory Care Prior Approval Number:    Date Approved/Denied:   PASRR Number:    Discharge Plan: Other (Comment) (Memory Care)    Current Diagnoses: Patient Active Problem List   Diagnosis Date Noted   Seizure-like activity (HCC) 11/15/2023   Atrial fibrillation with RVR (HCC) 11/15/2023   New onset seizure (HCC) 11/15/2023   COVID 10/24/2023   Rhabdomyolysis 10/24/2023   Acute metabolic encephalopathy 10/24/2023   Essential hypertension 10/24/2023   Syncope 07/13/2022   IFG (impaired fasting glucose) 04/23/2021   Age-related osteoporosis without current pathological fracture 04/23/2021   Syncope and collapse 07/03/2020   Memory disorder 03/01/2019   Medicare annual wellness visit, subsequent 03/21/2017   Routine adult health maintenance 03/21/2017   History of atrial fibrillation 11/26/2015   Edema 11/26/2015   Right leg swelling 02/06/2014   Low back pain 12/06/2012   Osteopenia 12/06/2012   Vitamin D  deficiency 12/06/2012   Malignant neoplasm of colon (HCC) 09/08/2009   ANEMIA 09/08/2009   Venous (peripheral) insufficiency 09/08/2009   GERD 09/08/2009   UTI 09/08/2009   Osteoarthritis 09/08/2009   OTHER DRUG ALLERGY 09/08/2009   Paroxysmal atrial fibrillation (HCC) 08/21/2009   Hyperlipidemia 10/30/2008   Anxiety state 10/30/2008   Fibromyalgia 10/30/2008   Idiopathic scoliosis and kyphoscoliosis 10/30/2008   POLIOMYELITIS, HX OF 10/30/2008     Orientation RESPIRATION BLADDER Height & Weight     Self  Normal Incontinent Weight: 117 lb 11.6 oz (53.4 kg) Height:  5\' 1"  (154.9 cm)  BEHAVIORAL SYMPTOMS/MOOD NEUROLOGICAL BOWEL NUTRITION STATUS      Continent Diet (regular)  AMBULATORY STATUS COMMUNICATION OF NEEDS Skin   Supervision Verbally Normal                       Personal Care Assistance Level of Assistance  Bathing, Feeding, Dressing Bathing Assistance: Limited assistance Feeding assistance: Limited assistance Dressing Assistance: Limited assistance     Functional Limitations Info  Sight Sight Info: Impaired        SPECIAL CARE FACTORS FREQUENCY  PT (By licensed PT), OT (By licensed OT)     PT Frequency: 3x/wk with HH OT Frequency: 3x/wk with HH            Contractures Contractures Info: Not present    Additional Factors Info  Code Status, Allergies Code Status Info: DNR Allergies Info: Sulfa Antibiotics, Alendronate , Aspirin, Cyclobenzaprine Hcl, Metaxalone, Nitrofurantoin, Aricept  (Donepezil ), Nortriptyline Hcl, Nsaids, Penicillins             Discharge Medications: TAKE these medications     aspirin 81 MG chewable tablet Chew 1 tablet (81 mg total) by mouth daily. Start taking on: Nov 19, 2023    bisacodyl 5 MG EC tablet Commonly known as: DULCOLAX Take 5 mg by mouth daily as needed for moderate constipation.    clopidogrel 75 MG tablet Commonly known as: PLAVIX Take 1 tablet (75 mg total) by mouth daily. Start taking on: Nov 19, 2023    cyanocobalamin  1000 MCG tablet Take 1 tablet (1,000 mcg total) by mouth daily.    levETIRAcetam 250 MG tablet Commonly known as: KEPPRA Take 1 tablet (250 mg total) by mouth 2 (two) times daily.    lidocaine 2 % solution Commonly known as: XYLOCAINE Use as directed 15 mLs in the mouth or throat every 6 (six) hours as needed for mouth pain.    melatonin 3 MG Tabs tablet Take 1 tablet (3 mg total) by mouth at bedtime as needed  (insomnia).    metoprolol  succinate 50 MG 24 hr tablet Commonly known as: Toprol  XL Take 1 tablet (50 mg total) by mouth daily.    PSYLLIUM PO Take 0.4 g by mouth at bedtime.    rosuvastatin 20 MG tablet Commonly known as: CRESTOR Take 1 tablet (20 mg total) by mouth daily. Start taking on: Nov 19, 2023    Tylenol  8 Hour Arthritis Pain 650 MG CR tablet Generic drug: acetaminophen  Take 650 mg by mouth every 12 (twelve) hours as needed for pain.    Vitamin D3 50 MCG (2000 UT) Tabs Take 1 tablet by mouth daily.    Relevant Imaging Results:  Relevant Lab Results:   Additional Information SS#: [id number]  Tandy Fam, LCSW

## 2023-11-18 NOTE — Discharge Summary (Signed)
 Physician Discharge Summary  Brenda Hammond NFA:[id number] DOB: [birth year] DOA: 11/15/2023  PCP: Patient, No Pcp Per  Admit date: 11/15/2023 Discharge date: 11/18/2023  Admitted From: Assisted living facility memory care Disposition: Assisted living facility memory care  Recommendations for Outpatient Follow-up:  Follow up with PCP in 1-2 weeks Neurology to schedule follow-up  Home Health: PT OT Equipment/Devices: None  Discharge Condition: Fair CODE STATUS: DNR with limited intervention Diet recommendation: Regular diet, nutritional supplements  Discharge summary: 89 y.o. female with medical history significant of advanced dementia, pAF, HTN, HLD, GERD, recent admission for COVID, rhabdomyolysis presented with acute metabolic encephalopathy following minute long episode of R gaze deviation and tremors like seizure. At the emergency room, confused.  Hemodynamically stable.  Right gaze preference.  Sleepy.  Found to have acute infarct right frontal operculum.  Also with A-fib.  Admitted with cardiology and neurology consultations. Remains in poor health. Patient is from [place] memory care. Stabilized today.  Treated for following conditions.  # Subacute ischemic stroke left frontal lobe, cardioembolic with history of A-fib not on anticoagulation: Clinical findings, seizure-like episode, right gaze preference and tremors. CT head findings, no acute findings.  Atrophy. MRI of the brain, 1 cm focus of mild diffusion signal abnormality involving the subcortical right frontal operculum. CT angiogram head and neck, no large vessel occlusion. 2D echocardiogram, ejection fraction 60 to 65%, grade 1 diastolic dysfunction.  No intracardiac thrombus.  No shunt. Antiplatelet therapy, none at home.  Now on aspirin Plavix for 3 months then plavix alone. LDL 96.  Added Crestor 20 mg. Hemoglobin A1c, 5.4.  No therapy needed. On regular diet. Stable enough to transfer back to memory care  unit where she will receive home health PT OT.   # Paroxysmal A-fib: In and out of A-fib.  Rate controlled.  On metoprolol  50 mg daily.  Multiple previous discussions, patient with advanced dementia and frequent fall.  Not appropriate candidate for therapeutic anticoagulation.  # Possible seizure: EEG mild to moderate diffuse encephalopathy.  No seizures.  Continue seizure precautions.  Will discharge on Keppra 250 mg twice daily and send neurology follow-up.   Blood pressure stable.   Patient unfortunately is in poor health condition and now with advanced dementia.  Unable to anticoagulate with A-fib.  If she has further deterioration of her health condition, she may benefit with hospice care.  Currently stable to discharge back to assisted living facility.  Discharge Diagnoses:  Principal Problem:   New onset seizure [facility]) Active Problems:   Seizure-like activity (HCC)   Atrial fibrillation with RVR [facility])    Discharge Instructions  Discharge Instructions     Diet general   Complete by: As directed    Increase activity slowly   Complete by: As directed       Allergies as of 11/18/2023       Reactions   Sulfa Antibiotics Anaphylaxis, Hypertension, Other (See Comments)   Elevated HR and BP, also and "ALLERGIC," per [facility] REACTION: elevated HR and BP   Alendronate  Other (See Comments)   "twitching" and "ALLERGIC," per [facility]   Aspirin Other (See Comments)   Made the ears ring and "ALLERGIC," per [facility]   Cyclobenzaprine Hcl Other (See Comments)   Nightmares- "ALLERGIC," per [facility]   Metaxalone Other (See Comments)   Nightmares- "ALLERGIC," per [facility]   Nitrofurantoin Other (See Comments), Hypertension   Elevated B/P and chest pain- "ALLERGIC," per [facility]   Aricept  [donepezil ] Rash, Other (See Comments)   Dizziness and cramping  also- "ALLERGIC," per [facility]   Nortriptyline Hcl Rash, Other (See Comments)   "ALLERGIC," per [facility]   Nsaids Rash, Other (See Comments)   "ALLERGIC," per [facility]    Penicillins Rash, Other (See Comments)   High fever, too- "ALLERGIC," per [facility]        Medication List     TAKE these medications    aspirin 81 MG chewable tablet Chew 1 tablet (81 mg total) by mouth daily. Start taking on: Nov 19, 2023   bisacodyl 5 MG EC tablet Commonly known as: DULCOLAX Take 5 mg by mouth daily as needed for moderate constipation.   clopidogrel 75 MG tablet Commonly known as: PLAVIX Take 1 tablet (75 mg total) by mouth daily. Start taking on: Nov 19, 2023   cyanocobalamin  1000 MCG tablet Take 1 tablet (1,000 mcg total) by mouth daily.   levETIRAcetam 250 MG tablet Commonly known as: KEPPRA Take 1 tablet (250 mg total) by mouth 2 (two) times daily.   lidocaine 2 % solution Commonly known as: XYLOCAINE Use as directed 15 mLs in the mouth or throat every 6 (six) hours as needed for mouth pain.   melatonin 3 MG Tabs tablet Take 1 tablet (3 mg total) by mouth at bedtime as needed (insomnia).   metoprolol  succinate 50 MG 24 hr tablet Commonly known as: Toprol  XL Take 1 tablet (50 mg total) by mouth daily.   PSYLLIUM PO Take 0.4 g by mouth at bedtime.   rosuvastatin 20 MG tablet Commonly known as: CRESTOR Take 1 tablet (20 mg total) by mouth daily. Start taking on: Nov 19, 2023   Tylenol  8 Hour Arthritis Pain 650 MG CR tablet Generic drug: acetaminophen  Take 650 mg by mouth every 12 (twelve) hours as needed for pain.   Vitamin D3 50 MCG (2000 UT) Tabs Take 1 tablet by mouth daily.        Allergies  Allergen Reactions   Sulfa Antibiotics Anaphylaxis, Hypertension and Other (See Comments)    Elevated HR and BP, also and "ALLERGIC," per [facility]  REACTION: elevated HR and BP   Alendronate  Other (See Comments)    "twitching" and "ALLERGIC," per [facility]   Aspirin Other (See Comments)    Made the ears ring and "ALLERGIC," per [facility]   Cyclobenzaprine Hcl Other (See Comments)    Nightmares- "ALLERGIC," per [facility]   Metaxalone Other (See Comments)     Nightmares- "ALLERGIC," per [facility]   Nitrofurantoin Other (See Comments) and Hypertension    Elevated B/P and chest pain- "ALLERGIC," per [facility]   Aricept  [Donepezil ] Rash and Other (See Comments)    Dizziness and cramping also- "ALLERGIC," per [facility]   Nortriptyline Hcl Rash and Other (See Comments)    "ALLERGIC," per [facility]   Nsaids Rash and Other (See Comments)    "ALLERGIC," per [facility]   Penicillins Rash and Other (See Comments)    High fever, too- "ALLERGIC," per [facility]    Consultations: Neurology   Procedures/Studies: DG CHEST PORT 1 VIEW Result Date: 11/17/2023 CLINICAL DATA:  Increased coughing. EXAM: PORTABLE CHEST 1 VIEW COMPARISON:  11/15/2023. FINDINGS: The heart size and mediastinal contours are within normal limits. There is atherosclerotic calcification of the aorta. No consolidation, effusion, or pneumothorax is seen. No acute osseous abnormality. IMPRESSION: No active disease. Electronically Signed   By: Wyvonnia Heimlich M.D.   On: 11/17/2023 21:18   ECHOCARDIOGRAM COMPLETE Result Date: 11/16/2023    ECHOCARDIOGRAM REPORT   Patient Name:   Brenda Hammond Date of Exam:  11/16/2023 Medical Rec #:  [id number]               Height:       61.0 in Accession #:    [id number]              Weight:       118.2 lb Date of Birth:  December 31, 1932               BSA:          1.510 m Patient Age:    89 years                BP:           144/66 mmHg Patient Gender: F                       HR:           61 bpm. Exam Location:  Inpatient Procedure: 2D Echo, Cardiac Doppler and Color Doppler (Both Spectral and Color            Flow Doppler were utilized during procedure). Indications:    Stroke  History:        Patient has prior history of Echocardiogram examinations, most                 recent 04/19/2023.  Sonographer:    Janette Medley Referring Phys: CAROLE N HALL IMPRESSIONS  1. Left ventricular ejection fraction, by estimation, is 60 to 65%. The left ventricle has normal function. The left ventricle has no  regional wall motion abnormalities. Left ventricular diastolic parameters are consistent with Grade I diastolic dysfunction (impaired relaxation).  2. Right ventricular systolic function is normal. The right ventricular size is normal. There is normal pulmonary artery systolic pressure.  3. The mitral valve is normal in structure. Mild mitral valve regurgitation. No evidence of mitral stenosis.  4. The aortic valve is normal in structure. Aortic valve regurgitation is not visualized. No aortic stenosis is present.  5. The inferior vena cava is normal in size with greater than 50% respiratory variability, suggesting right atrial pressure of 3 mmHg. FINDINGS  Left Ventricle: Left ventricular ejection fraction, by estimation, is 60 to 65%. The left ventricle has normal function. The left ventricle has no regional wall motion abnormalities. The left ventricular internal cavity size was normal in size. There is  no left ventricular hypertrophy. Left ventricular diastolic parameters are consistent with Grade I diastolic dysfunction (impaired relaxation). Indeterminate filling pressures. Right Ventricle: The right ventricular size is normal. No increase in right ventricular wall thickness. Right ventricular systolic function is normal. There is normal pulmonary artery systolic pressure. The tricuspid regurgitant velocity is 2.48 m/s, and  with an assumed right atrial pressure of 3 mmHg, the estimated right ventricular systolic pressure is 27.6 mmHg. Left Atrium: Left atrial size was normal in size. Right Atrium: Right atrial size was normal in size. Pericardium: There is no evidence of pericardial effusion. Mitral Valve: The mitral valve is normal in structure. Mild mitral valve regurgitation. No evidence of mitral valve stenosis. Tricuspid Valve: The tricuspid valve is normal in structure. Tricuspid valve regurgitation is mild . No evidence of tricuspid stenosis. Aortic Valve: The aortic valve is normal in structure.  Aortic valve regurgitation is not visualized. No aortic stenosis is present. Aortic valve mean gradient measures 3.0 mmHg. Aortic valve peak gradient measures 6.8 mmHg. Aortic valve area, by VTI measures 1.84 cm. Pulmonic Valve: The pulmonic valve  was normal in structure. Pulmonic valve regurgitation is not visualized. No evidence of pulmonic stenosis. Aorta: The aortic root is normal in size and structure. Venous: The inferior vena cava is normal in size with greater than 50% respiratory variability, suggesting right atrial pressure of 3 mmHg. IAS/Shunts: No atrial level shunt detected by color flow Doppler.  LEFT VENTRICLE PLAX 2D LVIDd:         4.60 cm   Diastology LVIDs:         2.80 cm   LV e' medial:    6.64 cm/s LV PW:         0.90 cm   LV E/e' medial:  13.3 LV IVS:        1.00 cm   LV e' lateral:   7.72 cm/s LVOT diam:     1.80 cm   LV E/e' lateral: 11.5 LV SV:         51 LV SV Index:   34 LVOT Area:     2.54 cm  RIGHT VENTRICLE             IVC RV S prime:     20.30 cm/s  IVC diam: 1.70 cm TAPSE (M-mode): 1.7 cm LEFT ATRIUM             Index        RIGHT ATRIUM           Index LA diam:        3.10 cm 2.05 cm/m   RA Area:     13.10 cm LA Vol (A2C):   27.5 ml 18.21 ml/m  RA Volume:   29.30 ml  19.40 ml/m LA Vol (A4C):   30.6 ml 20.26 ml/m LA Biplane Vol: 29.4 ml 19.47 ml/m  AORTIC VALVE AV Area (Vmax):    1.72 cm AV Area (Vmean):   1.64 cm AV Area (VTI):     1.84 cm AV Vmax:           130.00 cm/s AV Vmean:          85.600 cm/s AV VTI:            0.279 m AV Peak Grad:      6.8 mmHg AV Mean Grad:      3.0 mmHg LVOT Vmax:         88.00 cm/s LVOT Vmean:        55.000 cm/s LVOT VTI:          0.202 m LVOT/AV VTI ratio: 0.72  AORTA Ao Root diam: 2.40 cm Ao Asc diam:  2.70 cm MITRAL VALVE                TRICUSPID VALVE MV Area (PHT): 3.08 cm     TR Peak grad:   24.6 mmHg MV Decel Time: 246 msec     TR Vmax:        248.00 cm/s MV E velocity: 88.60 cm/s MV A velocity: 107.00 cm/s  SHUNTS MV E/A ratio:  0.83          Systemic VTI:  0.20 m                             Systemic Diam: 1.80 cm Maudine Sos MD Electronically signed by Maudine Sos MD Signature Date/Time: 11/16/2023/3:57:30 PM    Final    EEG adult Result Date: 11/16/2023 Arleene Lack, MD     11/16/2023 11:21 AM Patient Name:  Brenda Hammond MRN: [id number] Epilepsy Attending: Arleene Lack Referring Physician/Provider: Iva Mariner, MD Date: 11/16/2023 Duration: 26.03 mins Patient history: 89yo F. Was sitting at a table when she developed right gaze and tremors concerning for seizure lasting approximately 1 minute. EEG to evaluate for seizure Level of alertness: Awake AEDs during EEG study: LEV Technical aspects: This EEG study was done with scalp electrodes positioned according to the 10-20 International system of electrode placement. Electrical activity was reviewed with band pass filter of 1-70Hz , sensitivity of 7 uV/mm, display speed of 61mm/sec with a 60Hz  notched filter applied as appropriate. EEG data were recorded continuously and digitally stored.  Video monitoring was available and reviewed as appropriate. Description: The posterior dominant rhythm consists of 7 Hz activity of moderate voltage (25-35 uV) seen predominantly in posterior head regions, symmetric and reactive to eye opening and eye closing. EEG showed continuous generalized 5 to 7 Hz theta slowing admixed with fronto-central 13-15hz  beta activity. Hyperventilation and photic stimulation were not performed.   ABNORMALITY - Continuous slow, generalized IMPRESSION: This study is suggestive of mild to moderate diffuse encephalopathy. No seizures or epileptiform discharges were seen throughout the recording. Arleene Lack   MR Brain W and Wo Contrast Result Date: 11/16/2023 CLINICAL DATA:  Initial evaluation for acute seizure. EXAM: MRI HEAD WITHOUT AND WITH CONTRAST TECHNIQUE: Multiplanar, multiecho pulse sequences of the brain and surrounding structures were  obtained without and with intravenous contrast. CONTRAST:  5mL GADAVIST GADOBUTROL 1 MMOL/ML IV SOLN COMPARISON:  CT from earlier the same day. FINDINGS: Brain: Examination markedly degraded by motion artifact. Moderately advanced temporal lobe predominant cerebral atrophy. Patchy and confluent T2/FLAIR hyperintensity involving the periventricular and deep white matter, consistent chronic small vessel ischemic disease, moderate in nature. Multiple scattered remote small bilateral cerebellar infarcts noted. 1 cm focus of mild diffusion signal abnormality seen involving the subcortical right frontal operculum (series 3, image 33), likely a small focus of evolving subacute ischemia. No associated hemorrhage or mass effect. No other evidence for acute or subacute ischemia. Gray-white matter differentiation otherwise maintained. No acute or significant chronic intracranial blood products. No mass lesion, midline shift or mass effect. Ventricular prominence related to global parenchymal volume loss without hydrocephalus. No extra-axial fluid collection. Pituitary gland within normal limits. No other intrinsic temporal lobe abnormality. No visible abnormal enhancement. Vascular: Right vertebral artery hypoplastic and not well seen. Major intracranial vascular flow voids are otherwise well maintained. Skull and upper cervical spine: Craniocervical junction normal limits. Bone marrow signal intensity grossly normal. No scalp soft tissue abnormality. Sinuses/Orbits: Prior bilateral ocular lens replacement. Scattered mucosal thickening present throughout the paranasal sinuses, greater on the right. No significant mastoid effusion. Other: None. IMPRESSION: 1. 1 cm focus of mild diffusion signal abnormality involving the subcortical right frontal operculum, likely a small focus of evolving subacute ischemia. No associated hemorrhage or mass effect. 2. No other acute intracranial abnormality. 3. Moderately advanced temporal lobe  predominant cerebral atrophy with chronic small vessel ischemic disease, with multiple scattered remote bilateral cerebellar infarcts. Electronically Signed   By: Virgia Griffins M.D.   On: 11/16/2023 00:22   CT ANGIO HEAD NECK W WO CM Result Date: 11/15/2023 CLINICAL DATA:  Initial evaluation for acute stroke/TIA. EXAM: CT ANGIOGRAPHY HEAD AND NECK WITH AND WITHOUT CONTRAST TECHNIQUE: Multidetector CT imaging of the head and neck was performed using the standard protocol during bolus administration of intravenous contrast. Multiplanar CT image reconstructions and MIPs were obtained to evaluate the vascular anatomy. Carotid  stenosis measurements (when applicable) are obtained utilizing NASCET criteria, using the distal internal carotid diameter as the denominator. RADIATION DOSE REDUCTION: This exam was performed according to the departmental dose-optimization program which includes automated exposure control, adjustment of the mA and/or kV according to patient size and/or use of iterative reconstruction technique. CONTRAST:  75mL OMNIPAQUE  IOHEXOL  350 MG/ML SOLN COMPARISON:  CT from earlier the same day. FINDINGS: Visualized aortic arch CTA NECK FINDINGS Aortic arch: Within normal limits for caliber standard 3 vessel morphology. Aortic atherosclerosis. No significant stenosis about the origin the great vessels. Right carotid system: Right common and internal carotid arteries are patent without dissection. Mild atheromatous change about the right carotid bulb without stenosis. Left carotid system: Left common and internal carotid arteries are patent without stenosis or dissection. Vertebral arteries: Both vertebral arteries arise from subclavian arteries. Left vertebral artery dominant. Vertebral arteries patent without stenosis or dissection. Skeleton: No worrisome osseous lesions. Other neck: No other acute finding. Upper chest: Scattered peribronchial thickening within the visualized lungs.  Pleuroparenchymal thickening/scarring noted at the posterior left upper lobe. Review of the MIP images confirms the above findings CTA HEAD FINDINGS Anterior circulation: Atheromatous change about the carotid siphons without hemodynamically significant stenosis. A1 segments patent bilaterally. Normal anterior communicating artery complex. Right ACA patent without significant stenosis. Severe distal left 8 3 stenosis noted (series 12, image 34). Atheromatous irregularity about the M1 segments bilaterally with moderate stenosis at the mid left M1 segment, and severe stenosis at the right MCA bifurcation (series 11, image 24). Atheromatous change about the MCA branches with associated moderate to severe multifocal stenoses. No proximal MCA branch occlusion. Posterior circulation: Dominant left V4 segment widely patent. Right vertebral artery markedly hypoplastic but patent as well. Left PICA patent at its origin. Right PICA origin not well seen. Basilar patent without stenosis. Superior cerebral arteries patent bilaterally. Both PCAs primarily supplied via the basilar. Atheromatous change about the PCAs with associated moderate to severe bilateral P2 stenoses. PCAs remain patent to their distal aspects. Venous sinuses: Not well assessed due to timing the contrast bolus. Anatomic variants: As above.  No aneurysm. Review of the MIP images confirms the above findings IMPRESSION: 1. Negative CTA for large vessel occlusion or other emergent finding. 2. Intracranial atherosclerotic disease with associated moderate to severe multifocal stenoses about the M1 segments, MCA branches, and bilateral PCAs as above. 3. No hemodynamically significant stenosis within the neck. 4.  Aortic Atherosclerosis (ICD10-I70.0). Electronically Signed   By: Virgia Griffins M.D.   On: 11/15/2023 22:17   CT ABDOMEN PELVIS W CONTRAST Result Date: 11/15/2023 CLINICAL DATA:  Acute abdominal pain EXAM: CT ABDOMEN AND PELVIS WITH CONTRAST  TECHNIQUE: Multidetector CT imaging of the abdomen and pelvis was performed using the standard protocol following bolus administration of intravenous contrast. RADIATION DOSE REDUCTION: This exam was performed according to the departmental dose-optimization program which includes automated exposure control, adjustment of the mA and/or kV according to patient size and/or use of iterative reconstruction technique. CONTRAST:  75mL OMNIPAQUE  IOHEXOL  350 MG/ML SOLN COMPARISON:  None Available. FINDINGS: Lower chest: No acute abnormality. Hepatobiliary: No focal liver abnormality is seen. No gallstones, gallbladder wall thickening, or biliary dilatation. Pancreas: Unremarkable. No pancreatic ductal dilatation or surrounding inflammatory changes. Spleen: Normal in size without focal abnormality. Adrenals/Urinary Tract: Adrenal glands are within normal limits. Kidneys demonstrate a normal enhancement pattern bilaterally. No renal calculi or obstructive changes are seen. The bladder is well distended. Stomach/Bowel: The appendix is within normal limits. No obstructive  or inflammatory changes of the colon are seen. Small bowel and stomach are within normal limits. Vascular/Lymphatic: Aortic atherosclerosis. No enlarged abdominal or pelvic lymph nodes. Reproductive: Status post hysterectomy. No adnexal masses. Other: No abdominal wall hernia or abnormality. No abdominopelvic ascites. Musculoskeletal: No acute or significant osseous findings. IMPRESSION: No acute abnormality is identified within the abdomen correspond with the given clinical history. Electronically Signed   By: Violeta Grey M.D.   On: 11/15/2023 21:24   VAS US  LOWER EXTREMITY VENOUS (DVT) (ONLY [facility] & [facility]) Result Date: 11/15/2023  Lower Venous DVT Study Patient Name:  Brenda Hammond [facility]  Date of Exam:   11/15/2023 Medical Rec #: [id number]                Accession #:    [id number] Date of Birth: [birth year]                Patient Gender: F Patient Age:   72  years Exam Location[facility]  [facility] Procedure:      VAS US  LOWER EXTREMITY VENOUS (DVT) Referring Phys: Verdie Gladden DIXON --------------------------------------------------------------------------------  Indications: Pain, and Swelling.  Limitations: Patient's position. Comparison Study: No recent priors. Performing Technologist: Franky Ivanoff Sturdivant-Jones RDMS, RVT  Examination Guidelines: A complete evaluation includes B-mode imaging, spectral Doppler, color Doppler, and power Doppler as needed of all accessible portions of each vessel. Bilateral testing is considered an integral part of a complete examination. Limited examinations for reoccurring indications may be performed as noted. The reflux portion of the exam is performed with the patient in reverse Trendelenburg.  +-----+---------------+---------+-----------+----------+--------------+ RIGHTCompressibilityPhasicitySpontaneityPropertiesThrombus Aging +-----+---------------+---------+-----------+----------+--------------+ CFV  Full           Yes      Yes                                 +-----+---------------+---------+-----------+----------+--------------+ SFJ  Full                                                        +-----+---------------+---------+-----------+----------+--------------+   +---------+---------------+---------+-----------+----------+--------------+ LEFT     CompressibilityPhasicitySpontaneityPropertiesThrombus Aging +---------+---------------+---------+-----------+----------+--------------+ CFV      Full           Yes      Yes                                 +---------+---------------+---------+-----------+----------+--------------+ SFJ      Full                                                        +---------+---------------+---------+-----------+----------+--------------+ FV Prox  Full                                                         +---------+---------------+---------+-----------+----------+--------------+ FV Mid   Full                                                        +---------+---------------+---------+-----------+----------+--------------+  FV DistalFull                                                        +---------+---------------+---------+-----------+----------+--------------+ PFV      Full                                                        +---------+---------------+---------+-----------+----------+--------------+ POP      Full           Yes      Yes                                 +---------+---------------+---------+-----------+----------+--------------+ PTV      Full                                                        +---------+---------------+---------+-----------+----------+--------------+ PERO     Full                                                        +---------+---------------+---------+-----------+----------+--------------+     Summary: RIGHT: - No evidence of common femoral vein obstruction.   LEFT: - There is no evidence of deep vein thrombosis in the lower extremity.  - No cystic structure found in the popliteal fossa.  *See table(s) above for measurements and observations. Electronically signed by Genny Kid MD on 11/15/2023 at 7:08:53 PM.    Final    DG Chest Portable 1 View Result Date: 11/15/2023 CLINICAL DATA:  Seizure EXAM: PORTABLE CHEST 1 VIEW COMPARISON:  10/24/2023 FINDINGS: Heart and mediastinal contours are within normal limits. No focal opacities or effusions. No acute bony abnormality. IMPRESSION: No active disease. Electronically Signed   By: Janeece Mechanic M.D.   On: 11/15/2023 17:24   CT HEAD WO CONTRAST Result Date: 11/15/2023 CLINICAL DATA:  Seizure, new-onset, no history of trauma. EXAM: CT HEAD WITHOUT CONTRAST TECHNIQUE: Contiguous axial images were obtained from the base of the skull through the vertex without intravenous contrast.  RADIATION DOSE REDUCTION: This exam was performed according to the departmental dose-optimization program which includes automated exposure control, adjustment of the mA and/or kV according to patient size and/or use of iterative reconstruction technique. COMPARISON:  Head CT 10/24/2023 and MRI 04/19/2023 FINDINGS: Brain: There is no evidence of an acute infarct, intracranial hemorrhage, mass, midline shift, or extra-axial fluid collection. Patchy hypodensities in the cerebral white matter bilaterally are unchanged and nonspecific but compatible with moderate chronic small vessel ischemic disease small chronic bilateral cerebellar infarcts are unchanged. Advanced cerebral atrophy is again noted including pronounced bilateral temporal lobe volume loss. Vascular: Calcified atherosclerosis at the skull base. No hyperdense vessel. Skull: No fracture or suspicious lesion. Sinuses/Orbits: Moderate right frontal and right ethmoid sinus opacification, possibly a combination of mucosal thickening  and fluid/secretions. Mild mucosal thickening in the right maxillary sinus. Clear mastoid air cells. Bilateral cataract extraction. Other: None. IMPRESSION: 1. No evidence of acute intracranial abnormality. 2. Moderate chronic small vessel ischemic disease and advanced cerebral atrophy. Electronically Signed   By: Aundra Lee M.D.   On: 11/15/2023 16:19   DG Pelvis 1-2 Views Result Date: 10/24/2023 CLINICAL DATA:  Right hip pain and weakness for 1 day. EXAM: PELVIS - 1-2 VIEW COMPARISON:  08/17/2022 FINDINGS: Mild degenerative changes in the lower lumbar spine and in both hips. No focal bone lesion or bone destruction identified. No acute displaced fractures. SI joints and symphysis pubis are not displaced. Soft tissues are unremarkable. IMPRESSION: Mild degenerative changes in the hips.  No acute bony abnormalities. Electronically Signed   By: Boyce Byes M.D.   On: 10/24/2023 19:27   CT Head Wo Contrast Result Date:  10/24/2023 CLINICAL DATA:  Mental status change EXAM: CT HEAD WITHOUT CONTRAST TECHNIQUE: Contiguous axial images were obtained from the base of the skull through the vertex without intravenous contrast. RADIATION DOSE REDUCTION: This exam was performed according to the departmental dose-optimization program which includes automated exposure control, adjustment of the mA and/or kV according to patient size and/or use of iterative reconstruction technique. COMPARISON:  CT head 04/18/2023 and MRI head 04/19/2023 FINDINGS: Brain: No intracranial hemorrhage, mass effect, or evidence of acute infarct. No hydrocephalus. No extra-axial fluid collection. Generalized cerebral atrophy and chronic small vessel ischemic disease. Vascular: No hyperdense vessel. Intracranial arterial calcification. Skull: No fracture or focal lesion. Sinuses/Orbits: No acute finding. Other: None. IMPRESSION: No acute intracranial abnormality. Electronically Signed   By: Rozell Cornet M.D.   On: 10/24/2023 18:41   DG Chest Portable 1 View Result Date: 10/24/2023 CLINICAL DATA:  Altered mental status. EXAM: PORTABLE CHEST 1 VIEW COMPARISON:  04/18/2023. FINDINGS: Bilateral lung fields are clear. Bilateral costophrenic angles are clear. Normal cardio-mediastinal silhouette. No acute osseous abnormalities. The soft tissues are within normal limits. IMPRESSION: No active disease. Electronically Signed   By: Beula Brunswick M.D.   On: 10/24/2023 17:30   (Echo, Carotid, EGD, Colonoscopy, ERCP)    Subjective: Patient seen and examined.  Being fed by nurse technician.  She is confused.  While asking to shake hand, she started playing "Pat your hand".  She will try to take this provider's hand to her mouth.   Discharge Exam: Vitals:   11/18/23 0740 11/18/23 0940  BP: (!) 120/57 120/67  Pulse: 68 70  Resp: 18   Temp: 98.5 F (36.9 C)   SpO2: 99%    Vitals:   11/18/23 0325 11/18/23 0500 11/18/23 0740 11/18/23 0940  BP: (!) 116/53  (!)  120/57 120/67  Pulse: 65  68 70  Resp: 18  18   Temp: 99.5 F (37.5 C)  98.5 F (36.9 C)   TempSrc: Oral  Axillary   SpO2: 97%  99%   Weight:  53.4 kg    Height:        General: Pt is alert, awake, not in acute distress Pleasantly confused.  No focal deficits.  Uses all extremities equally. Cardiovascular: RRR, S1/S2 +, no rubs, no gallops Respiratory: CTA bilaterally, no wheezing, no rhonchi Abdominal: Soft, NT, ND, bowel sounds + Extremities: no edema, no cyanosis    The results of significant diagnostics from this hospitalization (including imaging, microbiology, ancillary and laboratory) are listed below for reference.     Microbiology: Recent Results (from the past 240 hours)  Culture, blood (Routine  X 2) w Reflex to ID Panel     Status: None (Preliminary result)   Collection Time: 11/16/23  9:34 AM   Specimen: BLOOD RIGHT FOREARM  Result Value Ref Range Status   Specimen Description BLOOD RIGHT FOREARM  Final   Special Requests   Final    BOTTLES DRAWN AEROBIC AND ANAEROBIC Blood Culture adequate volume   Culture   Final    NO GROWTH 2 DAYS Performed at [facility], [address] [place], [place] [address]    Report Status PENDING  Incomplete  Culture, blood (Routine X 2) w Reflex to ID Panel     Status: None (Preliminary result)   Collection Time: 11/16/23  7:35 PM   Specimen: BLOOD  Result Value Ref Range Status   Specimen Description BLOOD SITE NOT SPECIFIED  Final   Special Requests   Final    BOTTLES DRAWN AEROBIC AND ANAEROBIC Blood Culture results may not be optimal due to an inadequate volume of blood received in culture bottles   Culture   Final    NO GROWTH 2 DAYS Performed at [facility], [address] [place], [place] [address]    Report Status PENDING  Incomplete     Labs: BNP (last 3 results) No results for input(s): "BNP" in the last 8760 hours. Basic Metabolic Panel: Recent Labs  Lab 11/15/23 1359 11/16/23 0556  NA  137 136  K 3.8 3.7  CL 101 104  CO2 27 23  GLUCOSE 115* 82  BUN 10 11  CREATININE 0.89 0.82  CALCIUM  8.7* 8.1*  MG 1.9 1.8  PHOS  --  3.8   Liver Function Tests: Recent Labs  Lab 11/15/23 1359 11/16/23 0556  AST 26 25  ALT 15 15  ALKPHOS 91 77  BILITOT 0.6 0.3  PROT 6.5 5.9*  ALBUMIN 3.0* 2.7*   No results for input(s): "LIPASE", "AMYLASE" in the last 168 hours. No results for input(s): "AMMONIA" in the last 168 hours. CBC: Recent Labs  Lab 11/15/23 1359 11/16/23 0556 11/17/23 0527  WBC 6.8 5.2 5.7  NEUTROABS 4.9  --   --   HGB 13.2 12.2 11.7*  HCT 41.0 37.6 33.7*  MCV 89.3 90.0 88.0  PLT 201 197 218   Cardiac Enzymes: No results for input(s): "CKTOTAL", "CKMB", "CKMBINDEX", "TROPONINI" in the last 168 hours. BNP: Invalid input(s): "POCBNP" CBG: Recent Labs  Lab 11/15/23 1503  GLUCAP 83   D-Dimer No results for input(s): "DDIMER" in the last 72 hours. Hgb A1c Recent Labs    11/16/23 0556  HGBA1C 5.4   Lipid Profile Recent Labs    11/16/23 0556  CHOL 149  HDL 37*  LDLCALC 96  TRIG 82  CHOLHDL 4.0   Thyroid  function studies No results for input(s): "TSH", "T4TOTAL", "T3FREE", "THYROIDAB" in the last 72 hours.  Invalid input(s): "FREET3" Anemia work up No results for input(s): "VITAMINB12", "FOLATE", "FERRITIN", "TIBC", "IRON", "RETICCTPCT" in the last 72 hours. Urinalysis    Component Value Date/Time   COLORURINE STRAW (A) 11/15/2023 2300   APPEARANCEUR CLEAR 11/15/2023 2300   LABSPEC 1.028 11/15/2023 2300   PHURINE 7.0 11/15/2023 2300   GLUCOSEU NEGATIVE 11/15/2023 2300   GLUCOSEU NEGATIVE 04/23/2021 1458   HGBUR NEGATIVE 11/15/2023 2300   BILIRUBINUR NEGATIVE 11/15/2023 2300   KETONESUR 20 (A) 11/15/2023 2300   PROTEINUR NEGATIVE 11/15/2023 2300   UROBILINOGEN 0.2 04/23/2021 1458   NITRITE NEGATIVE 11/15/2023 2300   LEUKOCYTESUR TRACE (A) 11/15/2023 2300  Sepsis Labs Recent Labs  Lab 11/15/23 1359 11/16/23 0556  11/17/23 0527  WBC 6.8 5.2 5.7   Microbiology Recent Results (from the past 240 hours)  Culture, blood (Routine X 2) w Reflex to ID Panel     Status: None (Preliminary result)   Collection Time: 11/16/23  9:34 AM   Specimen: BLOOD RIGHT FOREARM  Result Value Ref Range Status   Specimen Description BLOOD RIGHT FOREARM  Final   Special Requests   Final    BOTTLES DRAWN AEROBIC AND ANAEROBIC Blood Culture adequate volume   Culture   Final    NO GROWTH 2 DAYS Performed at [facility], [address], [place], [place] [address]    Report Status PENDING  Incomplete  Culture, blood (Routine X 2) w Reflex to ID Panel     Status: None (Preliminary result)   Collection Time: 11/16/23  7:35 PM   Specimen: BLOOD  Result Value Ref Range Status   Specimen Description BLOOD SITE NOT SPECIFIED  Final   Special Requests   Final    BOTTLES DRAWN AEROBIC AND ANAEROBIC Blood Culture results may not be optimal due to an inadequate volume of blood received in culture bottles   Culture   Final    NO GROWTH 2 DAYS Performed at [facility], [address], [place], [place] [address]    Report Status PENDING  Incomplete     Time coordinating discharge: 35 minutes  SIGNED:   Vada Garibaldi, MD  [facility] 11/18/2023, 10:32 AM

## 2023-11-20 DIAGNOSIS — G9341 Metabolic encephalopathy: Secondary | ICD-10-CM | POA: Diagnosis not present

## 2023-11-20 DIAGNOSIS — M6282 Rhabdomyolysis: Secondary | ICD-10-CM | POA: Diagnosis not present

## 2023-11-20 DIAGNOSIS — I480 Paroxysmal atrial fibrillation: Secondary | ICD-10-CM | POA: Diagnosis not present

## 2023-11-20 DIAGNOSIS — U071 COVID-19: Secondary | ICD-10-CM | POA: Diagnosis not present

## 2023-11-20 DIAGNOSIS — I69393 Ataxia following cerebral infarction: Secondary | ICD-10-CM | POA: Diagnosis not present

## 2023-11-21 LAB — CULTURE, BLOOD (ROUTINE X 2)
Culture: NO GROWTH
Culture: NO GROWTH
Special Requests: ADEQUATE

## 2023-11-22 DIAGNOSIS — Z515 Encounter for palliative care: Secondary | ICD-10-CM | POA: Diagnosis not present

## 2023-11-22 DIAGNOSIS — I6330 Cerebral infarction due to thrombosis of unspecified cerebral artery: Secondary | ICD-10-CM | POA: Diagnosis not present

## 2023-11-22 DIAGNOSIS — R059 Cough, unspecified: Secondary | ICD-10-CM | POA: Diagnosis not present

## 2023-11-22 DIAGNOSIS — I480 Paroxysmal atrial fibrillation: Secondary | ICD-10-CM | POA: Diagnosis not present

## 2023-11-23 DIAGNOSIS — I69393 Ataxia following cerebral infarction: Secondary | ICD-10-CM | POA: Diagnosis not present

## 2023-11-23 DIAGNOSIS — U071 COVID-19: Secondary | ICD-10-CM | POA: Diagnosis not present

## 2023-11-23 DIAGNOSIS — I480 Paroxysmal atrial fibrillation: Secondary | ICD-10-CM | POA: Diagnosis not present

## 2023-11-23 DIAGNOSIS — G9341 Metabolic encephalopathy: Secondary | ICD-10-CM | POA: Diagnosis not present

## 2023-11-23 DIAGNOSIS — M6282 Rhabdomyolysis: Secondary | ICD-10-CM | POA: Diagnosis not present

## 2023-11-24 DIAGNOSIS — G301 Alzheimer's disease with late onset: Secondary | ICD-10-CM | POA: Diagnosis not present

## 2023-11-25 DIAGNOSIS — E785 Hyperlipidemia, unspecified: Secondary | ICD-10-CM | POA: Diagnosis not present

## 2023-11-25 DIAGNOSIS — K5900 Constipation, unspecified: Secondary | ICD-10-CM | POA: Diagnosis not present

## 2023-11-25 DIAGNOSIS — I4891 Unspecified atrial fibrillation: Secondary | ICD-10-CM | POA: Diagnosis not present

## 2023-11-25 DIAGNOSIS — E559 Vitamin D deficiency, unspecified: Secondary | ICD-10-CM | POA: Diagnosis not present

## 2023-11-25 DIAGNOSIS — M818 Other osteoporosis without current pathological fracture: Secondary | ICD-10-CM | POA: Diagnosis not present

## 2023-11-25 DIAGNOSIS — R569 Unspecified convulsions: Secondary | ICD-10-CM | POA: Diagnosis not present

## 2023-11-25 DIAGNOSIS — I6930 Unspecified sequelae of cerebral infarction: Secondary | ICD-10-CM | POA: Diagnosis not present

## 2023-11-25 DIAGNOSIS — Z66 Do not resuscitate: Secondary | ICD-10-CM | POA: Diagnosis not present

## 2023-11-25 DIAGNOSIS — Z515 Encounter for palliative care: Secondary | ICD-10-CM | POA: Diagnosis not present

## 2023-11-29 DIAGNOSIS — I69393 Ataxia following cerebral infarction: Secondary | ICD-10-CM | POA: Diagnosis not present

## 2023-11-29 DIAGNOSIS — U071 COVID-19: Secondary | ICD-10-CM | POA: Diagnosis not present

## 2023-11-29 DIAGNOSIS — G9341 Metabolic encephalopathy: Secondary | ICD-10-CM | POA: Diagnosis not present

## 2023-11-29 DIAGNOSIS — I480 Paroxysmal atrial fibrillation: Secondary | ICD-10-CM | POA: Diagnosis not present

## 2023-11-29 DIAGNOSIS — M6282 Rhabdomyolysis: Secondary | ICD-10-CM | POA: Diagnosis not present

## 2023-12-02 DIAGNOSIS — I69393 Ataxia following cerebral infarction: Secondary | ICD-10-CM | POA: Diagnosis not present

## 2023-12-02 DIAGNOSIS — G9341 Metabolic encephalopathy: Secondary | ICD-10-CM | POA: Diagnosis not present

## 2023-12-02 DIAGNOSIS — U071 COVID-19: Secondary | ICD-10-CM | POA: Diagnosis not present

## 2023-12-02 DIAGNOSIS — M6282 Rhabdomyolysis: Secondary | ICD-10-CM | POA: Diagnosis not present

## 2023-12-02 DIAGNOSIS — I480 Paroxysmal atrial fibrillation: Secondary | ICD-10-CM | POA: Diagnosis not present

## 2023-12-06 DIAGNOSIS — G9341 Metabolic encephalopathy: Secondary | ICD-10-CM | POA: Diagnosis not present

## 2023-12-06 DIAGNOSIS — U071 COVID-19: Secondary | ICD-10-CM | POA: Diagnosis not present

## 2023-12-06 DIAGNOSIS — M6282 Rhabdomyolysis: Secondary | ICD-10-CM | POA: Diagnosis not present

## 2023-12-06 DIAGNOSIS — I480 Paroxysmal atrial fibrillation: Secondary | ICD-10-CM | POA: Diagnosis not present

## 2023-12-06 DIAGNOSIS — I69393 Ataxia following cerebral infarction: Secondary | ICD-10-CM | POA: Diagnosis not present

## 2023-12-07 DIAGNOSIS — U071 COVID-19: Secondary | ICD-10-CM | POA: Diagnosis not present

## 2023-12-07 DIAGNOSIS — G9341 Metabolic encephalopathy: Secondary | ICD-10-CM | POA: Diagnosis not present

## 2023-12-07 DIAGNOSIS — Z8673 Personal history of transient ischemic attack (TIA), and cerebral infarction without residual deficits: Secondary | ICD-10-CM | POA: Diagnosis not present

## 2023-12-07 DIAGNOSIS — Z515 Encounter for palliative care: Secondary | ICD-10-CM | POA: Diagnosis not present

## 2023-12-07 DIAGNOSIS — G301 Alzheimer's disease with late onset: Secondary | ICD-10-CM | POA: Diagnosis not present

## 2023-12-07 DIAGNOSIS — I480 Paroxysmal atrial fibrillation: Secondary | ICD-10-CM | POA: Diagnosis not present

## 2023-12-07 DIAGNOSIS — I69393 Ataxia following cerebral infarction: Secondary | ICD-10-CM | POA: Diagnosis not present

## 2023-12-07 DIAGNOSIS — M6282 Rhabdomyolysis: Secondary | ICD-10-CM | POA: Diagnosis not present

## 2023-12-08 DIAGNOSIS — M6282 Rhabdomyolysis: Secondary | ICD-10-CM | POA: Diagnosis not present

## 2023-12-08 DIAGNOSIS — I69393 Ataxia following cerebral infarction: Secondary | ICD-10-CM | POA: Diagnosis not present

## 2023-12-08 DIAGNOSIS — G9341 Metabolic encephalopathy: Secondary | ICD-10-CM | POA: Diagnosis not present

## 2023-12-08 DIAGNOSIS — U071 COVID-19: Secondary | ICD-10-CM | POA: Diagnosis not present

## 2023-12-08 DIAGNOSIS — I480 Paroxysmal atrial fibrillation: Secondary | ICD-10-CM | POA: Diagnosis not present

## 2023-12-09 DIAGNOSIS — U071 COVID-19: Secondary | ICD-10-CM | POA: Diagnosis not present

## 2023-12-09 DIAGNOSIS — I480 Paroxysmal atrial fibrillation: Secondary | ICD-10-CM | POA: Diagnosis not present

## 2023-12-09 DIAGNOSIS — I69393 Ataxia following cerebral infarction: Secondary | ICD-10-CM | POA: Diagnosis not present

## 2023-12-09 DIAGNOSIS — M6282 Rhabdomyolysis: Secondary | ICD-10-CM | POA: Diagnosis not present

## 2023-12-09 DIAGNOSIS — G9341 Metabolic encephalopathy: Secondary | ICD-10-CM | POA: Diagnosis not present

## 2023-12-14 DIAGNOSIS — I69393 Ataxia following cerebral infarction: Secondary | ICD-10-CM | POA: Diagnosis not present

## 2023-12-14 DIAGNOSIS — G9341 Metabolic encephalopathy: Secondary | ICD-10-CM | POA: Diagnosis not present

## 2023-12-14 DIAGNOSIS — U071 COVID-19: Secondary | ICD-10-CM | POA: Diagnosis not present

## 2023-12-14 DIAGNOSIS — I480 Paroxysmal atrial fibrillation: Secondary | ICD-10-CM | POA: Diagnosis not present

## 2023-12-14 DIAGNOSIS — M6282 Rhabdomyolysis: Secondary | ICD-10-CM | POA: Diagnosis not present

## 2023-12-15 DIAGNOSIS — G9341 Metabolic encephalopathy: Secondary | ICD-10-CM | POA: Diagnosis not present

## 2023-12-15 DIAGNOSIS — I480 Paroxysmal atrial fibrillation: Secondary | ICD-10-CM | POA: Diagnosis not present

## 2023-12-15 DIAGNOSIS — M6282 Rhabdomyolysis: Secondary | ICD-10-CM | POA: Diagnosis not present

## 2023-12-15 DIAGNOSIS — U071 COVID-19: Secondary | ICD-10-CM | POA: Diagnosis not present

## 2023-12-15 DIAGNOSIS — I69393 Ataxia following cerebral infarction: Secondary | ICD-10-CM | POA: Diagnosis not present

## 2023-12-16 DIAGNOSIS — G9341 Metabolic encephalopathy: Secondary | ICD-10-CM | POA: Diagnosis not present

## 2023-12-16 DIAGNOSIS — M6282 Rhabdomyolysis: Secondary | ICD-10-CM | POA: Diagnosis not present

## 2023-12-16 DIAGNOSIS — I480 Paroxysmal atrial fibrillation: Secondary | ICD-10-CM | POA: Diagnosis not present

## 2023-12-16 DIAGNOSIS — I69393 Ataxia following cerebral infarction: Secondary | ICD-10-CM | POA: Diagnosis not present

## 2023-12-16 DIAGNOSIS — U071 COVID-19: Secondary | ICD-10-CM | POA: Diagnosis not present

## 2023-12-20 DIAGNOSIS — I69393 Ataxia following cerebral infarction: Secondary | ICD-10-CM | POA: Diagnosis not present

## 2023-12-20 DIAGNOSIS — I480 Paroxysmal atrial fibrillation: Secondary | ICD-10-CM | POA: Diagnosis not present

## 2023-12-20 DIAGNOSIS — M6282 Rhabdomyolysis: Secondary | ICD-10-CM | POA: Diagnosis not present

## 2023-12-20 DIAGNOSIS — G9341 Metabolic encephalopathy: Secondary | ICD-10-CM | POA: Diagnosis not present

## 2023-12-20 DIAGNOSIS — U071 COVID-19: Secondary | ICD-10-CM | POA: Diagnosis not present

## 2023-12-22 DIAGNOSIS — I480 Paroxysmal atrial fibrillation: Secondary | ICD-10-CM | POA: Diagnosis not present

## 2023-12-22 DIAGNOSIS — U071 COVID-19: Secondary | ICD-10-CM | POA: Diagnosis not present

## 2023-12-22 DIAGNOSIS — L603 Nail dystrophy: Secondary | ICD-10-CM | POA: Diagnosis not present

## 2023-12-22 DIAGNOSIS — G9341 Metabolic encephalopathy: Secondary | ICD-10-CM | POA: Diagnosis not present

## 2023-12-22 DIAGNOSIS — L84 Corns and callosities: Secondary | ICD-10-CM | POA: Diagnosis not present

## 2023-12-22 DIAGNOSIS — I69393 Ataxia following cerebral infarction: Secondary | ICD-10-CM | POA: Diagnosis not present

## 2023-12-22 DIAGNOSIS — M6282 Rhabdomyolysis: Secondary | ICD-10-CM | POA: Diagnosis not present

## 2023-12-22 DIAGNOSIS — I872 Venous insufficiency (chronic) (peripheral): Secondary | ICD-10-CM | POA: Diagnosis not present

## 2023-12-22 DIAGNOSIS — R238 Other skin changes: Secondary | ICD-10-CM | POA: Diagnosis not present

## 2023-12-22 DIAGNOSIS — M810 Age-related osteoporosis without current pathological fracture: Secondary | ICD-10-CM | POA: Diagnosis not present

## 2023-12-22 DIAGNOSIS — M6281 Muscle weakness (generalized): Secondary | ICD-10-CM | POA: Diagnosis not present

## 2023-12-27 DIAGNOSIS — G9341 Metabolic encephalopathy: Secondary | ICD-10-CM | POA: Diagnosis not present

## 2023-12-27 DIAGNOSIS — I69393 Ataxia following cerebral infarction: Secondary | ICD-10-CM | POA: Diagnosis not present

## 2023-12-27 DIAGNOSIS — M6282 Rhabdomyolysis: Secondary | ICD-10-CM | POA: Diagnosis not present

## 2023-12-27 DIAGNOSIS — E559 Vitamin D deficiency, unspecified: Secondary | ICD-10-CM | POA: Diagnosis not present

## 2023-12-27 DIAGNOSIS — G301 Alzheimer's disease with late onset: Secondary | ICD-10-CM | POA: Diagnosis not present

## 2023-12-27 DIAGNOSIS — R7301 Impaired fasting glucose: Secondary | ICD-10-CM | POA: Diagnosis not present

## 2023-12-27 DIAGNOSIS — I480 Paroxysmal atrial fibrillation: Secondary | ICD-10-CM | POA: Diagnosis not present

## 2023-12-28 DIAGNOSIS — I480 Paroxysmal atrial fibrillation: Secondary | ICD-10-CM | POA: Diagnosis not present

## 2023-12-28 DIAGNOSIS — M6282 Rhabdomyolysis: Secondary | ICD-10-CM | POA: Diagnosis not present

## 2023-12-28 DIAGNOSIS — I69393 Ataxia following cerebral infarction: Secondary | ICD-10-CM | POA: Diagnosis not present

## 2023-12-28 DIAGNOSIS — G9341 Metabolic encephalopathy: Secondary | ICD-10-CM | POA: Diagnosis not present

## 2024-01-04 DIAGNOSIS — I480 Paroxysmal atrial fibrillation: Secondary | ICD-10-CM | POA: Diagnosis not present

## 2024-01-04 DIAGNOSIS — G9341 Metabolic encephalopathy: Secondary | ICD-10-CM | POA: Diagnosis not present

## 2024-01-04 DIAGNOSIS — I69393 Ataxia following cerebral infarction: Secondary | ICD-10-CM | POA: Diagnosis not present

## 2024-01-04 DIAGNOSIS — M6282 Rhabdomyolysis: Secondary | ICD-10-CM | POA: Diagnosis not present

## 2024-01-06 DIAGNOSIS — G9341 Metabolic encephalopathy: Secondary | ICD-10-CM | POA: Diagnosis not present

## 2024-01-06 DIAGNOSIS — M6282 Rhabdomyolysis: Secondary | ICD-10-CM | POA: Diagnosis not present

## 2024-01-06 DIAGNOSIS — I480 Paroxysmal atrial fibrillation: Secondary | ICD-10-CM | POA: Diagnosis not present

## 2024-01-06 DIAGNOSIS — I69393 Ataxia following cerebral infarction: Secondary | ICD-10-CM | POA: Diagnosis not present

## 2024-01-09 DIAGNOSIS — I480 Paroxysmal atrial fibrillation: Secondary | ICD-10-CM | POA: Diagnosis not present

## 2024-01-09 DIAGNOSIS — M6282 Rhabdomyolysis: Secondary | ICD-10-CM | POA: Diagnosis not present

## 2024-01-09 DIAGNOSIS — G9341 Metabolic encephalopathy: Secondary | ICD-10-CM | POA: Diagnosis not present

## 2024-01-09 DIAGNOSIS — I69393 Ataxia following cerebral infarction: Secondary | ICD-10-CM | POA: Diagnosis not present

## 2024-01-10 DIAGNOSIS — G309 Alzheimer's disease, unspecified: Secondary | ICD-10-CM | POA: Diagnosis not present

## 2024-01-11 DIAGNOSIS — M6282 Rhabdomyolysis: Secondary | ICD-10-CM | POA: Diagnosis not present

## 2024-01-11 DIAGNOSIS — I69393 Ataxia following cerebral infarction: Secondary | ICD-10-CM | POA: Diagnosis not present

## 2024-01-11 DIAGNOSIS — I480 Paroxysmal atrial fibrillation: Secondary | ICD-10-CM | POA: Diagnosis not present

## 2024-01-11 DIAGNOSIS — G9341 Metabolic encephalopathy: Secondary | ICD-10-CM | POA: Diagnosis not present

## 2024-01-12 DIAGNOSIS — M6282 Rhabdomyolysis: Secondary | ICD-10-CM | POA: Diagnosis not present

## 2024-01-12 DIAGNOSIS — I480 Paroxysmal atrial fibrillation: Secondary | ICD-10-CM | POA: Diagnosis not present

## 2024-01-12 DIAGNOSIS — G9341 Metabolic encephalopathy: Secondary | ICD-10-CM | POA: Diagnosis not present

## 2024-01-12 DIAGNOSIS — I69393 Ataxia following cerebral infarction: Secondary | ICD-10-CM | POA: Diagnosis not present

## 2024-01-13 DIAGNOSIS — M6282 Rhabdomyolysis: Secondary | ICD-10-CM | POA: Diagnosis not present

## 2024-01-13 DIAGNOSIS — I69393 Ataxia following cerebral infarction: Secondary | ICD-10-CM | POA: Diagnosis not present

## 2024-01-13 DIAGNOSIS — G9341 Metabolic encephalopathy: Secondary | ICD-10-CM | POA: Diagnosis not present

## 2024-01-13 DIAGNOSIS — I480 Paroxysmal atrial fibrillation: Secondary | ICD-10-CM | POA: Diagnosis not present

## 2024-01-16 DIAGNOSIS — M6282 Rhabdomyolysis: Secondary | ICD-10-CM | POA: Diagnosis not present

## 2024-01-16 DIAGNOSIS — I69393 Ataxia following cerebral infarction: Secondary | ICD-10-CM | POA: Diagnosis not present

## 2024-01-16 DIAGNOSIS — G9341 Metabolic encephalopathy: Secondary | ICD-10-CM | POA: Diagnosis not present

## 2024-01-16 DIAGNOSIS — I480 Paroxysmal atrial fibrillation: Secondary | ICD-10-CM | POA: Diagnosis not present

## 2024-01-18 DIAGNOSIS — G9341 Metabolic encephalopathy: Secondary | ICD-10-CM | POA: Diagnosis not present

## 2024-01-18 DIAGNOSIS — I69393 Ataxia following cerebral infarction: Secondary | ICD-10-CM | POA: Diagnosis not present

## 2024-01-18 DIAGNOSIS — M6282 Rhabdomyolysis: Secondary | ICD-10-CM | POA: Diagnosis not present

## 2024-01-18 DIAGNOSIS — I480 Paroxysmal atrial fibrillation: Secondary | ICD-10-CM | POA: Diagnosis not present

## 2024-01-20 DIAGNOSIS — I69393 Ataxia following cerebral infarction: Secondary | ICD-10-CM | POA: Diagnosis not present

## 2024-01-20 DIAGNOSIS — G9341 Metabolic encephalopathy: Secondary | ICD-10-CM | POA: Diagnosis not present

## 2024-01-20 DIAGNOSIS — M6282 Rhabdomyolysis: Secondary | ICD-10-CM | POA: Diagnosis not present

## 2024-01-20 DIAGNOSIS — I480 Paroxysmal atrial fibrillation: Secondary | ICD-10-CM | POA: Diagnosis not present

## 2024-01-23 DIAGNOSIS — M6282 Rhabdomyolysis: Secondary | ICD-10-CM | POA: Diagnosis not present

## 2024-01-23 DIAGNOSIS — G9341 Metabolic encephalopathy: Secondary | ICD-10-CM | POA: Diagnosis not present

## 2024-01-23 DIAGNOSIS — I480 Paroxysmal atrial fibrillation: Secondary | ICD-10-CM | POA: Diagnosis not present

## 2024-01-23 DIAGNOSIS — I69393 Ataxia following cerebral infarction: Secondary | ICD-10-CM | POA: Diagnosis not present

## 2024-01-24 DIAGNOSIS — I69393 Ataxia following cerebral infarction: Secondary | ICD-10-CM | POA: Diagnosis not present

## 2024-01-24 DIAGNOSIS — E785 Hyperlipidemia, unspecified: Secondary | ICD-10-CM | POA: Diagnosis not present

## 2024-01-24 DIAGNOSIS — K5900 Constipation, unspecified: Secondary | ICD-10-CM | POA: Diagnosis not present

## 2024-01-24 DIAGNOSIS — M6282 Rhabdomyolysis: Secondary | ICD-10-CM | POA: Diagnosis not present

## 2024-01-24 DIAGNOSIS — I480 Paroxysmal atrial fibrillation: Secondary | ICD-10-CM | POA: Diagnosis not present

## 2024-01-24 DIAGNOSIS — G9341 Metabolic encephalopathy: Secondary | ICD-10-CM | POA: Diagnosis not present

## 2024-01-24 DIAGNOSIS — R634 Abnormal weight loss: Secondary | ICD-10-CM | POA: Diagnosis not present

## 2024-01-26 DIAGNOSIS — G9341 Metabolic encephalopathy: Secondary | ICD-10-CM | POA: Diagnosis not present

## 2024-01-26 DIAGNOSIS — I69393 Ataxia following cerebral infarction: Secondary | ICD-10-CM | POA: Diagnosis not present

## 2024-01-26 DIAGNOSIS — M6282 Rhabdomyolysis: Secondary | ICD-10-CM | POA: Diagnosis not present

## 2024-01-26 DIAGNOSIS — I480 Paroxysmal atrial fibrillation: Secondary | ICD-10-CM | POA: Diagnosis not present

## 2024-01-27 DIAGNOSIS — M6282 Rhabdomyolysis: Secondary | ICD-10-CM | POA: Diagnosis not present

## 2024-01-27 DIAGNOSIS — I480 Paroxysmal atrial fibrillation: Secondary | ICD-10-CM | POA: Diagnosis not present

## 2024-01-27 DIAGNOSIS — G9341 Metabolic encephalopathy: Secondary | ICD-10-CM | POA: Diagnosis not present

## 2024-01-30 DIAGNOSIS — M6282 Rhabdomyolysis: Secondary | ICD-10-CM | POA: Diagnosis not present

## 2024-01-30 DIAGNOSIS — G9341 Metabolic encephalopathy: Secondary | ICD-10-CM | POA: Diagnosis not present

## 2024-01-30 DIAGNOSIS — I69393 Ataxia following cerebral infarction: Secondary | ICD-10-CM | POA: Diagnosis not present

## 2024-01-31 DIAGNOSIS — I480 Paroxysmal atrial fibrillation: Secondary | ICD-10-CM | POA: Diagnosis not present

## 2024-02-02 DIAGNOSIS — I480 Paroxysmal atrial fibrillation: Secondary | ICD-10-CM | POA: Diagnosis not present

## 2024-02-02 DIAGNOSIS — G9341 Metabolic encephalopathy: Secondary | ICD-10-CM | POA: Diagnosis not present

## 2024-02-02 DIAGNOSIS — M6282 Rhabdomyolysis: Secondary | ICD-10-CM | POA: Diagnosis not present

## 2024-02-03 DIAGNOSIS — R7301 Impaired fasting glucose: Secondary | ICD-10-CM | POA: Diagnosis not present

## 2024-02-03 DIAGNOSIS — E559 Vitamin D deficiency, unspecified: Secondary | ICD-10-CM | POA: Diagnosis not present

## 2024-02-03 DIAGNOSIS — E785 Hyperlipidemia, unspecified: Secondary | ICD-10-CM | POA: Diagnosis not present

## 2024-02-03 DIAGNOSIS — I480 Paroxysmal atrial fibrillation: Secondary | ICD-10-CM | POA: Diagnosis not present

## 2024-02-06 ENCOUNTER — Other Ambulatory Visit (HOSPITAL_BASED_OUTPATIENT_CLINIC_OR_DEPARTMENT_OTHER): Payer: Self-pay

## 2024-02-07 DIAGNOSIS — M6282 Rhabdomyolysis: Secondary | ICD-10-CM | POA: Diagnosis not present

## 2024-02-07 DIAGNOSIS — I69393 Ataxia following cerebral infarction: Secondary | ICD-10-CM | POA: Diagnosis not present

## 2024-02-07 DIAGNOSIS — G9341 Metabolic encephalopathy: Secondary | ICD-10-CM | POA: Diagnosis not present

## 2024-02-08 DIAGNOSIS — G9341 Metabolic encephalopathy: Secondary | ICD-10-CM | POA: Diagnosis not present

## 2024-02-08 DIAGNOSIS — M6282 Rhabdomyolysis: Secondary | ICD-10-CM | POA: Diagnosis not present

## 2024-02-08 DIAGNOSIS — I480 Paroxysmal atrial fibrillation: Secondary | ICD-10-CM | POA: Diagnosis not present

## 2024-02-08 DIAGNOSIS — I69393 Ataxia following cerebral infarction: Secondary | ICD-10-CM | POA: Diagnosis not present

## 2024-02-16 ENCOUNTER — Telehealth: Payer: Self-pay | Admitting: Neurology

## 2024-02-16 NOTE — Telephone Encounter (Signed)
 Daughter reports pt going into Hospice

## 2024-02-17 ENCOUNTER — Ambulatory Visit: Admitting: Cardiology

## 2024-02-23 DIAGNOSIS — M810 Age-related osteoporosis without current pathological fracture: Secondary | ICD-10-CM | POA: Diagnosis not present

## 2024-02-23 DIAGNOSIS — M6281 Muscle weakness (generalized): Secondary | ICD-10-CM | POA: Diagnosis not present

## 2024-02-23 DIAGNOSIS — M6282 Rhabdomyolysis: Secondary | ICD-10-CM | POA: Diagnosis not present

## 2024-02-23 DIAGNOSIS — R238 Other skin changes: Secondary | ICD-10-CM | POA: Diagnosis not present

## 2024-02-23 DIAGNOSIS — I480 Paroxysmal atrial fibrillation: Secondary | ICD-10-CM | POA: Diagnosis not present

## 2024-02-23 DIAGNOSIS — I872 Venous insufficiency (chronic) (peripheral): Secondary | ICD-10-CM | POA: Diagnosis not present

## 2024-02-23 DIAGNOSIS — G9341 Metabolic encephalopathy: Secondary | ICD-10-CM | POA: Diagnosis not present

## 2024-02-23 DIAGNOSIS — L603 Nail dystrophy: Secondary | ICD-10-CM | POA: Diagnosis not present

## 2024-02-23 DIAGNOSIS — I69393 Ataxia following cerebral infarction: Secondary | ICD-10-CM | POA: Diagnosis not present

## 2024-02-23 DIAGNOSIS — L84 Corns and callosities: Secondary | ICD-10-CM | POA: Diagnosis not present

## 2024-02-24 ENCOUNTER — Ambulatory Visit: Admitting: Cardiology

## 2024-03-01 ENCOUNTER — Institutional Professional Consult (permissible substitution): Admitting: Neurology
